# Patient Record
Sex: Male | Born: 1965
Health system: Southern US, Community
[De-identification: ages and names within clinical notes are randomized; demographics above are authoritative.]

## PROBLEM LIST (undated history)

## (undated) DIAGNOSIS — R188 Other ascites: Secondary | ICD-10-CM

## (undated) DIAGNOSIS — U071 COVID-19: Secondary | ICD-10-CM

## (undated) DIAGNOSIS — K7030 Alcoholic cirrhosis of liver without ascites: Secondary | ICD-10-CM

## (undated) DIAGNOSIS — K7031 Alcoholic cirrhosis of liver with ascites: Secondary | ICD-10-CM

## (undated) DIAGNOSIS — E119 Type 2 diabetes mellitus without complications: Secondary | ICD-10-CM

## (undated) DIAGNOSIS — K7460 Unspecified cirrhosis of liver: Secondary | ICD-10-CM

## (undated) DIAGNOSIS — F419 Anxiety disorder, unspecified: Secondary | ICD-10-CM

## (undated) HISTORY — PX: ANKLE SURGERY: SHX546

## (undated) HISTORY — DX: Unspecified cirrhosis of liver: R18.8

## (undated) HISTORY — DX: Alcoholic cirrhosis of liver with ascites: K70.31

## (undated) HISTORY — DX: Anxiety disorder, unspecified: F41.9

## (undated) HISTORY — DX: Type 2 diabetes mellitus without complications: E11.9

## (undated) HISTORY — DX: COVID-19: U07.1

## (undated) HISTORY — DX: Alcoholic cirrhosis of liver without ascites: K70.30

## (undated) HISTORY — DX: Unspecified cirrhosis of liver: K74.60

---

## 2003-05-22 ENCOUNTER — Encounter: Payer: Self-pay | Admitting: Emergency Medicine

## 2003-05-22 ENCOUNTER — Inpatient Hospital Stay (HOSPITAL_COMMUNITY): Admission: EM | Admit: 2003-05-22 | Discharge: 2003-05-29 | Payer: Self-pay

## 2003-05-22 ENCOUNTER — Encounter: Payer: Self-pay | Admitting: Specialist

## 2003-05-23 ENCOUNTER — Encounter: Payer: Self-pay | Admitting: Specialist

## 2003-05-25 ENCOUNTER — Encounter: Payer: Self-pay | Admitting: Specialist

## 2003-09-06 ENCOUNTER — Encounter: Payer: Self-pay | Admitting: Specialist

## 2003-09-06 ENCOUNTER — Encounter: Admission: RE | Admit: 2003-09-06 | Discharge: 2003-09-06 | Payer: Self-pay | Admitting: Specialist

## 2003-10-22 ENCOUNTER — Encounter: Admission: RE | Admit: 2003-10-22 | Discharge: 2003-10-22 | Payer: Self-pay | Admitting: *Deleted

## 2005-10-06 ENCOUNTER — Encounter: Admission: RE | Admit: 2005-10-06 | Discharge: 2005-10-06 | Payer: Self-pay | Admitting: Occupational Medicine

## 2007-03-24 ENCOUNTER — Emergency Department (HOSPITAL_COMMUNITY): Admission: EM | Admit: 2007-03-24 | Discharge: 2007-03-24 | Payer: Self-pay | Admitting: Family Medicine

## 2012-06-09 ENCOUNTER — Encounter (HOSPITAL_COMMUNITY): Payer: Self-pay | Admitting: Emergency Medicine

## 2012-06-09 ENCOUNTER — Emergency Department (INDEPENDENT_AMBULATORY_CARE_PROVIDER_SITE_OTHER)
Admission: EM | Admit: 2012-06-09 | Discharge: 2012-06-09 | Disposition: A | Discharge status: Home / Self Care | Payer: Worker's Compensation | Source: Home / Self Care | Attending: Emergency Medicine | Admitting: Emergency Medicine

## 2012-06-09 DIAGNOSIS — M7580 Other shoulder lesions, unspecified shoulder: Secondary | ICD-10-CM

## 2012-06-09 DIAGNOSIS — M67919 Unspecified disorder of synovium and tendon, unspecified shoulder: Secondary | ICD-10-CM

## 2012-06-09 MED ORDER — PREDNISONE 5 MG PO KIT: 1.0000 | PACK | Freq: Every day | ORAL | Status: DC

## 2012-06-09 MED ORDER — TRAMADOL HCL 50 MG PO TABS: 100.0000 mg | ORAL_TABLET | Freq: Three times a day (TID) | ORAL | Status: AC | PRN

## 2012-06-09 MED ORDER — MELOXICAM 15 MG PO TABS: 15.0000 mg | ORAL_TABLET | Freq: Every day | ORAL | Status: DC

## 2012-06-09 NOTE — ED Notes (Signed)
PT HERE WITH SUDDEN R SHOULDER PAIN AFTER REACHING FOR SOMETHING ON SHELF TODAY.POSS STRAIN BUT DENIES INJURY.PAIN WITH LIFTING.

## 2012-06-09 NOTE — ED Provider Notes (Signed)
Chief Complaint  Patient presents with  . Shoulder Pain    History of Present Illness:   The patient is a 46 year old male who has had right neck and shoulder pain since this afternoon around 4:55 PM. He was mixing some paint in a large bowl, then lifted up to put on a shelf. Thereafter he noticed the pain in his right trapezius ridge radiating into her shoulder and into his upper arm as far as the elbow, but not below the elbow. The upper arm feels a little bit tingly. It hurts to move his neck but also move the shoulder as well. He has a fairly good range of motion but with pain. He denies any weakness in the arm. He's never had any prior history of neck or shoulder injury or problems.  Review of Systems:  Other than noted above, the patient denies any of the following symptoms: Systemic:  No fevers, chills, sweats, or aches.  No fatigue or tiredness. Musculoskeletal:  No joint pain, arthritis, bursitis, swelling, back pain, or neck pain. Neurological:  No muscular weakness, paresthesias, headache, or trouble with speech or coordination.  No dizziness.   PMFSH:  Past medical history, family history, social history, meds, and allergies were reviewed.  Physical Exam:   Vital signs:  BP 129/91  Pulse 89  Temp 99.3 F (37.4 C) (Oral)  Resp 14  SpO2 95% Gen:  Alert and oriented times 3.  In no distress. Musculoskeletal: There was tenderness to palpation over the trapezius ridge and over the cervical spine. The neck has a fairly good range of motion but with pain. There is pain to palpation over the shoulder anteriorly and posteriorly. No swelling or deformity. The shoulder had a full range of motion actively and passively with pain on abduction. Impingement signs are positive. Hawkins tests was positive, empty cans test was positive, but muscle strength was normal. Otherwise, all joints had a full a ROM with no swelling, bruising or deformity.  No edema, pulses full. Extremities were warm and  pink.  Capillary refill was brisk.  Skin:  Clear, warm and dry.  No rash. Neuro:  Alert and oriented times 3.  Muscle strength was normal.  Sensation was intact to light touch.   Assessment:  The encounter diagnosis was Rotator cuff tendonitis.  Plan:   1.  The following meds were prescribed:   New Prescriptions   MELOXICAM (MOBIC) 15 MG TABLET    Take 1 tablet (15 mg total) by mouth daily.   PREDNISONE 5 MG KIT    Take 1 kit (5 mg total) by mouth daily after breakfast. Prednisone 5 mg 6 day dosepack.  Take as directed.   TRAMADOL (ULTRAM) 50 MG TABLET    Take 2 tablets (100 mg total) by mouth every 8 (eight) hours as needed for pain.   2.  The patient was instructed in symptomatic care, including rest and activity, elevation, application of ice and compression.  Appropriate handouts were given. 3.  The patient was told to return if becoming worse in any way, if no better in 3 or 4 days, and given some red flag symptoms that would indicate earlier return.   4.  The patient was told to follow up at occupational health next week. In the meantime he was given exercises to do and should use ice afterwards. No work until reevaluated at occupational health.   Reuben Likes, MD 06/09/12 [phone]

## 2012-06-15 ENCOUNTER — Telehealth (HOSPITAL_COMMUNITY): Payer: Self-pay | Admitting: *Deleted

## 2012-06-15 NOTE — ED Notes (Unsigned)
Mickie Kay called on VM and requested pt.'s chart. It is [employer].  Record faxed to [employer] and confirmation received. Vassie Moselle. 06/15/2012

## 2012-09-19 ENCOUNTER — Ambulatory Visit: Payer: Managed Care, Other (non HMO) | Admitting: Physician Assistant

## 2012-09-19 VITALS — BP 116/86 | HR 84 | Temp 98.0°F | Resp 18 | Ht 67.5 in | Wt 208.2 lb

## 2012-09-19 DIAGNOSIS — F411 Generalized anxiety disorder: Secondary | ICD-10-CM

## 2012-09-19 DIAGNOSIS — F419 Anxiety disorder, unspecified: Secondary | ICD-10-CM

## 2012-09-19 DIAGNOSIS — Z0000 Encounter for general adult medical examination without abnormal findings: Secondary | ICD-10-CM

## 2012-09-19 LAB — COMPREHENSIVE METABOLIC PANEL
ALT: 15 U/L (ref 0–53)
AST: 28 U/L (ref 0–37)
Albumin: 4.4 g/dL (ref 3.5–5.2)
Alkaline Phosphatase: 81 U/L (ref 39–117)
BUN: 11 mg/dL (ref 6–23)
CO2: 25 mEq/L (ref 19–32)
Calcium: 9.9 mg/dL (ref 8.4–10.5)
Chloride: 105 mEq/L (ref 96–112)
Creat: 1.06 mg/dL (ref 0.50–1.35)
Glucose, Bld: 91 mg/dL (ref 70–99)
Potassium: 4 mEq/L (ref 3.5–5.3)
Sodium: 140 mEq/L (ref 135–145)
Total Bilirubin: 1.2 mg/dL (ref 0.3–1.2)
Total Protein: 7.1 g/dL (ref 6.0–8.3)

## 2012-09-19 LAB — POCT CBC
Granulocyte percent: 65.4 %G (ref 37–80)
HCT, POC: 57.6 % — AB (ref 43.5–53.7)
Hemoglobin: 19 g/dL — AB (ref 14.1–18.1)
Lymph, poc: 1.8 (ref 0.6–3.4)
MCH, POC: 29.8 pg (ref 27–31.2)
MCHC: 33 g/dL (ref 31.8–35.4)
MCV: 90.5 fL (ref 80–97)
MID (cbc): 0.4 (ref 0–0.9)
MPV: 11 fL (ref 0–99.8)
POC Granulocyte: 4.2 (ref 2–6.9)
POC LYMPH PERCENT: 27.7 %L (ref 10–50)
POC MID %: 6.9 %M (ref 0–12)
Platelet Count, POC: 207 10*3/uL (ref 142–424)
RBC: 6.37 M/uL — AB (ref 4.69–6.13)
RDW, POC: 14.1 %
WBC: 6.4 10*3/uL (ref 4.6–10.2)

## 2012-09-19 LAB — LIPID PANEL
Cholesterol: 153 mg/dL (ref 0–200)
HDL: 43 mg/dL (ref >39)
LDL Cholesterol: 95 mg/dL (ref 0–99)
Total CHOL/HDL Ratio: 3.6 Ratio
Triglycerides: 75 mg/dL (ref <150)
VLDL: 15 mg/dL (ref 0–40)

## 2012-09-19 LAB — GLUCOSE, POCT (MANUAL RESULT ENTRY): POC Glucose: 89 mg/dl (ref 70–99)

## 2012-09-19 MED ORDER — CITALOPRAM HYDROBROMIDE 20 MG PO TABS: 20.0000 mg | ORAL_TABLET | Freq: Every day | ORAL | Status: DC

## 2012-09-19 MED ORDER — ALPRAZOLAM 0.25 MG PO TABS: 0.2500 mg | ORAL_TABLET | Freq: Every evening | ORAL | Status: DC | PRN

## 2012-09-19 NOTE — Progress Notes (Signed)
  Subjective:    Patient ID: Ian Hall, male    DOB: [birth year], 46 y.o.   MRN: [id number]  HPI 46 year old male presents for complete physical and evaluation of anxiety.  He has no known medical problems but does have a family history of diabetes.  Last physical date unknown.  Not taking any daily medications at this time.  Does admit to anxiety x several months. He has been married for about 1 year and states he worries about his wife while she is out. She is happy and seemingly unaware of his anxiety about her.  He also has stress about work.  He also admits to insomnia due to racing thoughts and stress.  He works second shift and so sleeping schedule also contributes to this. Denies ever having been on medication for anxiety or depression.      Review of Systems  Constitutional: Negative.   HENT: Negative.   Eyes: Negative.   Respiratory: Negative.   Cardiovascular: Negative.   Gastrointestinal: Negative.   Genitourinary: Negative.   Musculoskeletal: Negative.   Neurological: Negative.   Hematological: Negative.   Psychiatric/Behavioral: Negative for suicidal ideas and hallucinations. The patient is nervous/anxious.   All other systems reviewed and are negative.       Objective:   Physical Exam  Constitutional: He is oriented to person, place, and time. He appears well-developed and well-nourished.  HENT:  Head: Normocephalic and atraumatic.  Right Ear: Hearing, tympanic membrane, external ear and ear canal normal.  Left Ear: Hearing, tympanic membrane, external ear and ear canal normal.  Mouth/Throat: Uvula is midline, oropharynx is clear and moist and mucous membranes are normal. No oropharyngeal exudate.  Eyes: Conjunctivae normal are normal.  Neck: Normal range of motion. No thyromegaly present.  Cardiovascular: Normal rate, regular rhythm and normal heart sounds.   Pulmonary/Chest: Effort normal and breath sounds normal.  Abdominal: Soft. Bowel sounds are normal. There  is no tenderness. There is no rebound and no guarding.  Musculoskeletal: Normal range of motion.  Lymphadenopathy:    He has no cervical adenopathy.  Neurological: He is alert and oriented to person, place, and time.  Psychiatric: He has a normal mood and affect. His behavior is normal. Judgment and thought content normal.          Assessment & Plan:   1. Routine general medical examination at a health care facility  POCT CBC, Comprehensive metabolic panel, Lipid panel, TSH, POCT glucose (manual entry)  2. Anxiety  citalopram (CELEXA) 20 MG tablet, ALPRAZolam (XANAX) 0.25 MG tablet   Await labs Start Celexa 20 mg daily Xanax prn, especially at night. Advised use of caution with this medication and to use sparingly. Follow up in 4-6 weeks for recheck

## 2012-09-20 LAB — TSH: TSH: 2.783 u[IU]/mL (ref 0.350–4.500)

## 2012-11-17 ENCOUNTER — Ambulatory Visit: Payer: Managed Care, Other (non HMO) | Admitting: Physician Assistant

## 2012-11-17 VITALS — BP 110/86 | HR 74 | Temp 98.4°F | Resp 16 | Ht 67.0 in | Wt 205.8 lb

## 2012-11-17 DIAGNOSIS — Z125 Encounter for screening for malignant neoplasm of prostate: Secondary | ICD-10-CM

## 2012-11-17 DIAGNOSIS — F411 Generalized anxiety disorder: Secondary | ICD-10-CM

## 2012-11-17 DIAGNOSIS — F419 Anxiety disorder, unspecified: Secondary | ICD-10-CM

## 2012-11-17 LAB — IFOBT (OCCULT BLOOD): IFOBT: NEGATIVE

## 2012-11-17 MED ORDER — CITALOPRAM HYDROBROMIDE 20 MG PO TABS: 20.0000 mg | ORAL_TABLET | Freq: Every day | ORAL | Status: DC

## 2012-11-17 MED ORDER — ALPRAZOLAM 0.5 MG PO TABS: 0.2500 mg | ORAL_TABLET | Freq: Every evening | ORAL | Status: DC | PRN

## 2012-11-17 NOTE — Progress Notes (Signed)
  Subjective:    Patient ID: Ian Hall, male    DOB: 1966-11-20, 46 y.o.   MRN: [id number]  HPI 46 year old male presents for DOT PE and recheck on his anxiety.  States the Celexa was working well, although he did run out 2 weeks ago and has been unable to come in for recheck. He takes xanax 1-2 hours before going to sleep, so he has still not been able to sleep well.   He had CPE 2 months ago.  Doing well without complaints.     Review of Systems  Constitutional: Negative for fever and chills.  All other systems reviewed and are negative.       Objective:   Physical Exam  Constitutional: He is oriented to person, place, and time. He appears well-developed and well-nourished.  HENT:  Head: Normocephalic and atraumatic.  Right Ear: External ear normal.  Left Ear: External ear normal.  Eyes: Conjunctivae normal are normal.  Neck: Normal range of motion.  Cardiovascular: Normal rate, regular rhythm and normal heart sounds.   Pulmonary/Chest: Effort normal and breath sounds normal.  Abdominal: Hernia confirmed negative in the right inguinal area and confirmed negative in the left inguinal area.  Genitourinary: Rectum normal and prostate normal. Prostate is not tender.  Neurological: He is alert and oriented to person, place, and time.  Psychiatric: He has a normal mood and affect. His behavior is normal. Judgment and thought content normal.          Assessment & Plan:   1. Screening for prostate cancer  IFOBT POC (occult bld, rslt in office)  2. Anxiety  citalopram (CELEXA) 20 MG tablet, ALPRAZolam (XANAX) 0.5 MG tablet   Will complete DOT forms based on physical exam in October. He will get peripheral screen from opthalmology or optometry and bring results to [place] to complete his DOT card.  Continue Celexa 20 mg daily Xanax qhs right before sleeping  Follow up in 6 months for recheck of anxiety

## 2013-02-06 ENCOUNTER — Ambulatory Visit (INDEPENDENT_AMBULATORY_CARE_PROVIDER_SITE_OTHER): Payer: Self-pay

## 2013-02-06 ENCOUNTER — Other Ambulatory Visit: Payer: Self-pay | Admitting: Emergency Medicine

## 2013-02-06 DIAGNOSIS — Z0289 Encounter for other administrative examinations: Secondary | ICD-10-CM

## 2013-02-06 DIAGNOSIS — Z0000 Encounter for general adult medical examination without abnormal findings: Secondary | ICD-10-CM

## 2013-02-26 ENCOUNTER — Ambulatory Visit: Payer: Managed Care, Other (non HMO)

## 2013-02-26 ENCOUNTER — Ambulatory Visit (INDEPENDENT_AMBULATORY_CARE_PROVIDER_SITE_OTHER): Payer: Managed Care, Other (non HMO) | Admitting: Family Medicine

## 2013-02-26 VITALS — BP 118/82 | HR 75 | Temp 98.3°F | Resp 16 | Ht 67.0 in | Wt 210.0 lb

## 2013-02-26 DIAGNOSIS — M25511 Pain in right shoulder: Secondary | ICD-10-CM

## 2013-02-26 DIAGNOSIS — M25519 Pain in unspecified shoulder: Secondary | ICD-10-CM

## 2013-02-26 DIAGNOSIS — M79672 Pain in left foot: Secondary | ICD-10-CM

## 2013-02-26 DIAGNOSIS — M79609 Pain in unspecified limb: Secondary | ICD-10-CM

## 2013-02-26 MED ORDER — PREDNISONE 20 MG PO TABS: ORAL_TABLET | ORAL | Status: DC

## 2013-02-26 NOTE — Progress Notes (Signed)
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  He was working yesterday and by 2 o'clock he had developed some right shoulder pain.  He awoke with worse right shoulder pain and left lateral foot pain.  No specific injury occurred.  Had tendonitis 1 year ago.  No cough or fever, no other joint pains.  Objective:  NAD Decreased elevation of right shoulder with AROM.  No obvious abnormality of bones or skin.  Tender anterior upper aspect of shoulder to palpation.  Able to reach around behind himself with right arm Chest:  clear Left foot:  Tender lateral aspect of foot without swelling or ecchymosis.  [facility] reading (PRIMARY) by  Dr. Milus Glazier:  Neg right shoulder   Assessment:  Tendonitis left foot, arthralgia right shoulder  Plan: Prednisone OOW x 24 hours.

## 2013-03-04 ENCOUNTER — Ambulatory Visit: Payer: Managed Care, Other (non HMO) | Admitting: Family Medicine

## 2013-03-04 VITALS — BP 140/88 | HR 104 | Temp 99.0°F | Resp 18 | Ht 67.5 in | Wt 210.0 lb

## 2013-03-04 DIAGNOSIS — M25511 Pain in right shoulder: Secondary | ICD-10-CM

## 2013-03-04 DIAGNOSIS — M79672 Pain in left foot: Secondary | ICD-10-CM

## 2013-03-04 DIAGNOSIS — M25519 Pain in unspecified shoulder: Secondary | ICD-10-CM

## 2013-03-04 DIAGNOSIS — M79609 Pain in unspecified limb: Secondary | ICD-10-CM

## 2013-03-04 MED ORDER — PREDNISONE 20 MG PO TABS: ORAL_TABLET | ORAL | Status: DC

## 2013-03-04 NOTE — Progress Notes (Signed)
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 He was working 6 days ago and by 2 o'clock he had developed some right shoulder pain. He awoke with worse right shoulder pain and left lateral foot pain. No specific injury occurred. Had tendonitis 1 year ago.   Overall improved but some soreness presists No cough or fever, but outside of left foot there has been soreness which is improving with the prednisone.  Objective: NAD  Decreased elevation of right shoulder with AROM. No obvious abnormality of bones or skin. Tender anterior upper aspect of shoulder to palpation. Able to reach around behind himself with right arm  Chest: clear  Left foot: Tender lateral aspect of foot without swelling or ecchymosis.  [facility] reading (PRIMARY) by Dr. Milus Glazier: Neg right shoulder   Assessment: Tendonitis left foot, arthralgia right shoulder most likely tendonitis as well Plan:  Prednisone two more days OOW x until Wednesday.

## 2013-07-27 ENCOUNTER — Encounter: Payer: Self-pay | Admitting: Family Medicine

## 2013-09-19 ENCOUNTER — Ambulatory Visit: Payer: Managed Care, Other (non HMO) | Admitting: Family Medicine

## 2013-09-19 VITALS — BP 120/82 | HR 85 | Temp 97.5°F | Resp 18 | Ht 67.5 in | Wt 216.0 lb

## 2013-09-19 DIAGNOSIS — B081 Molluscum contagiosum: Secondary | ICD-10-CM

## 2013-09-19 DIAGNOSIS — B079 Viral wart, unspecified: Secondary | ICD-10-CM

## 2013-09-19 DIAGNOSIS — Q828 Other specified congenital malformations of skin: Secondary | ICD-10-CM

## 2013-09-19 NOTE — Progress Notes (Signed)
[facility] [address] [place] [address] [phone]  Date:  09/19/2013   Name:  Ian Hall   DOB:  05-15-1966   MRN:  [id number]  PCP:  No primary provider on file.    Chief Complaint: wart on right hand and cracked nail   History of Present Illness:  Ian Hall is a 47 y.o. very pleasant male patient who presents with the following:  About a month ago he hit his left index finger nail with a hammer.  This splint the nail- it is still attached but is split longitudinally.  It is no longer painful and the proximal nail plate is growing in in one piece.   He also notes a wart on the ring finger of his right hand.  He does not have any other warts.  Wonders if we can freeze this off today.   Finally, he notes a lot of skin tags which are troublesome to him.  They are around his neck, and especially bad in both axillae.    He is otherwise generally healthy.    There are no active problems to display for this patient.   Past Medical History  Diagnosis Date  . Anxiety     Past Surgical History  Procedure Laterality Date  . Ankle surgery      History  Substance Use Topics  . Smoking status: Never Smoker   . Smokeless tobacco: Not on file  . Alcohol Use: No    Family History  Problem Relation Age of Onset  . Diabetes Mother   . Memory loss Mother   . Diabetes Father     No Known Allergies  Medication list has been reviewed and updated.  Current Outpatient Prescriptions on File Prior to Visit  Medication Sig Dispense Refill  . Multiple Vitamin (MULTIVITAMIN) tablet Take 1 tablet by mouth daily.      . predniSONE (DELTASONE) 20 MG tablet Two daily with food  10 tablet  0   No current facility-administered medications on file prior to visit.    Review of Systems:  As per HPI- otherwise negative.   Physical Examination: Filed Vitals:   09/19/13 1315  BP: 120/82  Pulse: 85  Temp: 97.5 F (36.4 C)  Resp: 18   Filed  Vitals:   09/19/13 1315  Height: 5' 7.5" (1.715 m)  Weight: 216 lb (97.977 kg)   Body mass index is 33.31 kg/(m^2). Ideal Body Weight: Weight in (lb) to have BMI = 25: 161.7  GEN: WDWN, NAD, Non-toxic, A & O x 3, looks well HEENT: Atraumatic, Normocephalic. Neck supple. No masses, No LAD. Ears and Nose: No external deformity. CV: RRR, No M/G/R. No JVD. No thrill. No extra heart sounds. PULM: CTA B, no wheezes, crackles, rhonchi. No retractions. No resp. distress. No accessory muscle use. EXTR: No c/c/e NEURO Normal gait.  PSYCH: Normally interactive. Conversant. Not depressed or anxious appearing.  Calm demeanor.  Small wart on ring finger of right hand. Left index fingernail is splint along the distal nail bed.  The proximal nail plate is growing in in one piece. Multiple small skin tags around his neck line and also in both axillae.    VC obtained.  Wart prepped with alcohol. Cryotherapy X3 free/ thaw cycles, band- aid Skin tags around neckline prepped with betadine. Larger ones injected at the base with 1% lidocaine and epi.  Removed tags with forceps and scalpel.  Dry-sol and pressure for hemostasis, dressed  with band- aids.   Repeated this procedure for some of the skin tags under the right arm.  Removed 15 tags in total today  Assessment and Plan: Accessory skin tags  Wart  Treated wart as above Skin tags.  Removed a good number today. He will follow-up another day for more.  He has too many to remove in one day Splint nail: not painful.  Advised him to keep the edges filed to prevent catching on anything.  I believe the nail will grow back in in one piece.   Follow- up if any concerns.    Signed Lamar Blinks, MD

## 2013-11-08 ENCOUNTER — Ambulatory Visit: Payer: Managed Care, Other (non HMO) | Admitting: Emergency Medicine

## 2013-11-08 VITALS — BP 140/80 | HR 68 | Temp 98.0°F | Resp 16 | Ht 67.5 in | Wt 222.0 lb

## 2013-11-08 DIAGNOSIS — L909 Atrophic disorder of skin, unspecified: Secondary | ICD-10-CM

## 2013-11-08 DIAGNOSIS — L918 Other hypertrophic disorders of the skin: Secondary | ICD-10-CM

## 2013-11-08 NOTE — Progress Notes (Signed)
Procedure:  Consent obtained - R groin - #15 skin tags removed without difficulty - 1/2 of them removed after local anesthesia with 1% lido with epi.  Drsg placed.  L groin #9 skin tags removed without local anesthesia.  Drsg placed.  Wound care d/w pt.

## 2013-11-08 NOTE — Progress Notes (Signed)
[facility] [address], [place] [address] [phone]  Date:  11/08/2013   Name:  Ian Hall   DOB:  Mar 13, 1966   MRN:  [id number]  PCP:  No primary provider on file.    Chief Complaint: mole removal   History of Present Illness:  Ian Hall is a 47 y.o. very pleasant male patient who presents with the following:  Concerned that he has skin tags in his right axilla and groin.  Requests they be removed due to pain.  No improvement with over the counter medications or other home remedies. Denies other complaint or health concern today.   There are no active problems to display for this patient.   Past Medical History  Diagnosis Date  . Anxiety     Past Surgical History  Procedure Laterality Date  . Ankle surgery      History  Substance Use Topics  . Smoking status: Never Smoker   . Smokeless tobacco: Not on file  . Alcohol Use: No    Family History  Problem Relation Age of Onset  . Diabetes Mother   . Memory loss Mother   . Diabetes Father     No Known Allergies  Medication list has been reviewed and updated.  Current Outpatient Prescriptions on File Prior to Visit  Medication Sig Dispense Refill  . Multiple Vitamin (MULTIVITAMIN) tablet Take 1 tablet by mouth daily.      . predniSONE (DELTASONE) 20 MG tablet Two daily with food  10 tablet  0   No current facility-administered medications on file prior to visit.    Review of Systems:  As per HPI, otherwise negative.    Physical Examination: Filed Vitals:   11/08/13 1154  BP: 140/80  Pulse: 68  Temp: 98 F (36.7 C)  Resp: 16   Filed Vitals:   11/08/13 1154  Height: 5' 7.5" (1.715 m)  Weight: 222 lb (100.699 kg)   Body mass index is 34.24 kg/(m^2). Ideal Body Weight: Weight in (lb) to have BMI = 25: 161.7   GEN: WDWN, NAD, Non-toxic, Alert & Oriented x 3 HEENT: Atraumatic, Normocephalic.  Ears and Nose: No external deformity. EXTR: No  clubbing/cyanosis/edema NEURO: Normal gait.  PSYCH: Normally interactive. Conversant. Not depressed or anxious appearing.  Calm demeanor.  SKIN: inumerable skin tags.    Assessment and Plan: Skin tags 23 removed by Florentina Addison   Signed,  Phillips Odor, MD

## 2014-06-07 ENCOUNTER — Ambulatory Visit (INDEPENDENT_AMBULATORY_CARE_PROVIDER_SITE_OTHER): Payer: Managed Care, Other (non HMO) | Admitting: Family Medicine

## 2014-06-07 VITALS — BP 128/80 | HR 72 | Temp 97.7°F | Resp 16 | Ht 67.0 in | Wt 222.4 lb

## 2014-06-07 DIAGNOSIS — Q828 Other specified congenital malformations of skin: Secondary | ICD-10-CM

## 2014-06-07 DIAGNOSIS — B079 Viral wart, unspecified: Secondary | ICD-10-CM

## 2014-06-07 NOTE — Patient Instructions (Signed)
Come back and see [place] in a couple of weeks for more skin tag removal if you want.  We can also freeze your wart again.  Good to see you today!

## 2014-06-07 NOTE — Progress Notes (Signed)
[facility] [address], [place] [id number] [phone]  Date:  06/07/2014   Name:  Ian ParsonsByron E Hall   DOB:  09/24/1966   MRN:  [id number]  PCP:  No PCP Per Patient    Chief Complaint: Nevus   History of Present Illness:  Ian Hall is a 48 y.o. very pleasant male patient who presents with the following:  Would like to have some skin tags removed.  He tends to get a lot of skin tags- we have removed for him a couple of times in the past. He is self- conscious about skin tags under his arms.  He also notes a small wart on his right ring finger that he wold like treated.     He is otherwise generally healthy  No other concerns today  There are no active problems to display for this patient.   Past Medical History  Diagnosis Date  . Anxiety     Past Surgical History  Procedure Laterality Date  . Ankle surgery      History  Substance Use Topics  . Smoking status: Former Games developermoker  . Smokeless tobacco: Not on file  . Alcohol Use: No    Family History  Problem Relation Age of Onset  . Diabetes Mother   . Memory loss Mother   . Diabetes Father     No Known Allergies  Medication list has been reviewed and updated.  Current Outpatient Prescriptions on File Prior to Visit  Medication Sig Dispense Refill  . Multiple Vitamin (MULTIVITAMIN) tablet Take 1 tablet by mouth daily.      . predniSONE (DELTASONE) 20 MG tablet Two daily with food  10 tablet  0   No current facility-administered medications on file prior to visit.    Review of Systems:  As per HPI- otherwise negative.   Physical Examination: Filed Vitals:   06/07/14 1123  BP: 128/80  Pulse: 72  Temp: 97.7 F (36.5 C)  Resp: 16   Filed Vitals:   06/07/14 1123  Height: 5\' 7"  (1.702 m)  Weight: 222 lb 6.4 oz (100.88 kg)   Body mass index is 34.82 kg/(m^2). Ideal Body Weight: Weight in (lb) to have BMI = 25: 159.3  GEN: WDWN, NAD, Non-toxic, A & O x 3 HEENT: Atraumatic,  Normocephalic. Neck supple. No masses, No LAD. Ears and Nose: No external deformity. CV: RRR, No M/G/R. No JVD. No thrill. No extra heart sounds. PULM: CTA B, no wheezes, crackles, rhonchi. No retractions. No resp. distress. No accessory muscle use. ABD: S, NT, ND, +BS. No rebound. No HSM. EXTR: No c/c/e NEURO Normal gait.  PSYCH: Normally interactive. Conversant. Not depressed or anxious appearing.  Calm demeanor.  Small wart ring finger right hand Multiple skin tags in both axillae  VC obtained.  Skin tags prepped with betadine, anesthesia with a small wheal of lidocaine with epi, removed with 15 blade.  Less than 5ml of lidocaine with epi total used. Skin tag removal sites treated with drysol for hemostasis, and dressed with bandaids Cryotherapy to small wart on his lateral right ring finger X3 cycles  Assessment and Plan: Accessory skin tags  Wart  Removed approx 15 skin tags as above, and cryotheapy to wart X3 cycles.    Signed Abbe AmsterdamJessica Deauna Yaw, MD

## 2014-09-27 ENCOUNTER — Ambulatory Visit (INDEPENDENT_AMBULATORY_CARE_PROVIDER_SITE_OTHER): Payer: Self-pay | Admitting: Family Medicine

## 2014-09-27 VITALS — BP 124/88 | HR 80 | Temp 98.0°F | Resp 18 | Ht 67.25 in | Wt 224.8 lb

## 2014-09-27 DIAGNOSIS — Z0279 Encounter for issue of other medical certificate: Secondary | ICD-10-CM

## 2014-09-27 DIAGNOSIS — Z021 Encounter for pre-employment examination: Secondary | ICD-10-CM

## 2014-09-27 DIAGNOSIS — Z024 Encounter for examination for driving license: Secondary | ICD-10-CM

## 2014-09-27 NOTE — Patient Instructions (Signed)
2 year card.  Follow up with primary care provider to discuss sleep study with your snoring. Until that time, do not drive if you are sleepy. Let me know if there are any questions.

## 2014-09-27 NOTE — Progress Notes (Signed)
   Subjective:    Patient ID: Ian Hall, male    DOB: 08/16/1966, 48 y.o.   MRN: [id number]  HPI Ian Hall is a 48 y.o. male Here for DOT exam. See scanned ppwk/.  No known medical problems, no rx meds. Last card good for 2 years.  Does have snoring at night. No known pauses, occasional daytime somnolence only. Not having to drink caffeine for alertness, never fallen asleep at the wheel.     No known hx of heart disease.   I personally performed the services described in this documentation, which was scribed in my presence. The recorded information has been reviewed and considered, and addended by me as needed.  No corrective lenses, no visual difficulty, no problems with glare.   Review of Systems See DOT ppwk - negative other than listed above.     Objective:   Physical Exam  Vitals reviewed. Constitutional: He is oriented to person, place, and time. He appears well-developed and well-nourished.  HENT:  Head: Normocephalic and atraumatic.  Right Ear: External ear normal.  Left Ear: External ear normal.  Mouth/Throat: Oropharynx is clear and moist.  Eyes: Conjunctivae and EOM are normal. Pupils are equal, round, and reactive to light.  Neck: Normal range of motion. Neck supple. No thyromegaly present.  Cardiovascular: Normal rate, regular rhythm, normal heart sounds and intact distal pulses.   Pulmonary/Chest: Effort normal and breath sounds normal. No respiratory distress. He has no wheezes.  Abdominal: Soft. He exhibits no distension. There is no tenderness. Hernia confirmed negative in the right inguinal area and confirmed negative in the left inguinal area.  Musculoskeletal: Normal range of motion. He exhibits no edema and no tenderness.  Lymphadenopathy:    He has no cervical adenopathy.  Neurological: He is alert and oriented to person, place, and time. He has normal reflexes.  Skin: Skin is warm and dry.  Psychiatric: He has a normal mood and affect. His behavior  is normal.   Filed Vitals:   09/27/14 1222  BP: 124/88  Pulse: 80  Temp: 98 F (36.7 C)  TempSrc: Oral  Resp: 18  Height: 5' 7.25" (1.708 m)  Weight: 224 lb 12.8 oz (101.969 kg)  SpO2: 95%    Visual Acuity Screening   Right eye Left eye Both eyes  Without correction: 20/20 20/25-2 20/20  With correction:     Comments: Peripheral: R 85 L 85 The patient can distinguish the colors red, amber and green.   Hearing Screening Comments: The patient was able to hear a forced whisper from 10 feet.       Assessment & Plan:  Ian Hall is a 48 y.o. 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 See paperwork.  Snoring with only rare/occasional fatigue during day.  No pauses at night and not needing caffeine or other daytime stimulants. Recommended eval with PCP to discuss sleep study, and if diagnosed with OSA - compliance report needed at next DOT.   No orders of the defined types were placed in this encounter.   Patient Instructions  2 year card.  Follow up with primary care provider to discuss sleep study with your snoring. Until that time, do not drive if you are sleepy. Let me know if there are any questions.

## 2015-08-13 ENCOUNTER — Ambulatory Visit (INDEPENDENT_AMBULATORY_CARE_PROVIDER_SITE_OTHER): Payer: Managed Care, Other (non HMO)

## 2015-08-13 ENCOUNTER — Ambulatory Visit (INDEPENDENT_AMBULATORY_CARE_PROVIDER_SITE_OTHER): Payer: Managed Care, Other (non HMO) | Admitting: Emergency Medicine

## 2015-08-13 VITALS — BP 126/90 | HR 69 | Temp 97.6°F | Resp 16 | Ht 67.0 in | Wt 230.4 lb

## 2015-08-13 DIAGNOSIS — R059 Cough, unspecified: Secondary | ICD-10-CM

## 2015-08-13 DIAGNOSIS — J029 Acute pharyngitis, unspecified: Secondary | ICD-10-CM

## 2015-08-13 DIAGNOSIS — J189 Pneumonia, unspecified organism: Secondary | ICD-10-CM | POA: Diagnosis not present

## 2015-08-13 DIAGNOSIS — R05 Cough: Secondary | ICD-10-CM

## 2015-08-13 LAB — POCT RAPID STREP A (OFFICE): RAPID STREP A SCREEN: NEGATIVE

## 2015-08-13 LAB — POCT CBC
Granulocyte percent: 52.7 %G (ref 37–80)
HCT, POC: 49 % (ref 43.5–53.7)
HEMOGLOBIN: 15.3 g/dL (ref 14.1–18.1)
LYMPH, POC: 2.2 (ref 0.6–3.4)
MCH, POC: 26.9 pg — AB (ref 27–31.2)
MCHC: 31.3 g/dL — AB (ref 31.8–35.4)
MCV: 85.9 fL (ref 80–97)
MID (cbc): 1 — AB (ref 0–0.9)
MPV: 8.3 fL (ref 0–99.8)
POC Granulocyte: 3.5 (ref 2–6.9)
POC LYMPH %: 32.2 % (ref 10–50)
POC MID %: 15.1 % — AB (ref 0–12)
Platelet Count, POC: 168 10*3/uL (ref 142–424)
RBC: 5.7 M/uL (ref 4.69–6.13)
RDW, POC: 13.5 %
WBC: 6.7 10*3/uL (ref 4.6–10.2)

## 2015-08-13 MED ORDER — BENZONATATE 100 MG PO CAPS: 100.0000 mg | ORAL_CAPSULE | Freq: Three times a day (TID) | ORAL | Status: DC | PRN

## 2015-08-13 MED ORDER — AZITHROMYCIN 250 MG PO TABS: ORAL_TABLET | ORAL | Status: DC

## 2015-08-13 MED ORDER — FLUTICASONE PROPIONATE 50 MCG/ACT NA SUSP: 2.0000 | Freq: Every day | NASAL | Status: DC

## 2015-08-13 NOTE — Progress Notes (Addendum)
Subjective:  This chart was scribed for Ian Hall, by [name], at [facility].  This patient was seen in room 12 and the patient's care was started at 2:38 PM.    Patient ID: Ian Hall, male    DOB: 1966/11/08, 49 y.o.   MRN: [id number] Chief Complaint  Patient presents with  . Cough    x 3 days  . Headache    x 3 days    HPI  HPI Comments: Ian Hall is a 49 y.o. male who presents to the [facility] complaining of a headache and non productive cough onset three days ago (worse at night time).  Patient has associated symptoms of loss of appetite with nausea/vomitting- states that he is unable to eat, burning in his chest/flank when he coughs as well as nasal congestion/ postnasal drip.  He is unsure if he has been running a fever but has been feeling hot the last couple days.  Patient states that 1 person in his household was sick over the weekend.  Patient does not smoke.  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     There are no active problems to display for this patient.  Past Medical History  Diagnosis Date  . Anxiety    Past Surgical History  Procedure Laterality Date  . Ankle surgery     No Known Allergies Prior to Admission medications   Medication Sig Start Date End Date Taking? Authorizing Provider  Multiple Vitamin (MULTIVITAMIN) tablet Take 1 tablet by mouth daily.    Historical Provider, Hall  predniSONE (DELTASONE) 20 MG tablet Two daily with food Patient not taking: Reported on 08/13/2015 03/04/13   Elvina Sidle, Hall   Social History   Social History  . Marital Status: Married    Spouse Name: N/A  . Number of Children: N/A  . Years of Education: N/A   Occupational History  . Administrator   Social History Main Topics  . Smoking status: Former Games developer  . Smokeless tobacco: Not on file  . Alcohol Use:  No  . Drug Use: No  . Sexual Activity: Yes    Birth Control/ Protection: None   Other Topics Concern  . Not on file   Social History Narrative    Review of Systems  Constitutional: Positive for appetite change. Negative for diaphoresis.  HENT: Positive for congestion and postnasal drip.   Eyes: Negative for pain, discharge and itching.  Respiratory: Positive for cough. Negative for choking.   Gastrointestinal: Positive for nausea and vomiting.  Musculoskeletal: Negative for neck pain and neck stiffness.  Skin: Negative for rash and wound.  Neurological: Positive for headaches. Negative for syncope and speech difficulty.       Objective:   Physical Exam CONSTITUTIONAL: Well developed/well nourished HEAD: Normocephalic/atraumatic EYES: EOMI/PERRL ENMT: Mucous membranes moist NECK: supple no meningeal signs SPINE/BACK:entire spine nontender CV: S1/S2 noted, no murmurs/rubs/gallops noted LUNGS: Lungs are clear to auscultation bilaterally, no apparent distress ABDOMEN: soft, nontender, no rebound or guarding, bowel sounds noted throughout abdomen GU:no cva tenderness NEURO: Pt is awake/alert/appropriate, moves all extremitiesx4.  No facial droop.   EXTREMITIES: pulses normal/equal, full ROM SKIN: warm, color normal PSYCH: no abnormalities of mood noted, alert and oriented to situation    Filed Vitals:   08/13/15 1337  BP: 126/90  Pulse: 69  Temp: 97.6 F (36.4 C)  TempSrc: Oral  Resp: 16  Height:  (1.702 m)  Weight: 230 lb 6.4 oz (104.509 kg)  SpO2: 98%   [facility] (PRIMARY) x-ray report read by Dr. Cleta Alberts,  Mild increased markings in right base and retrocardiac  Results for orders placed or performed in visit on 08/13/15  POCT CBC  Result Value Ref Range   WBC 6.7 4.6 - 10.2 K/uL   Lymph, poc 2.2 0.6 - 3.4   POC LYMPH PERCENT 32.2 10 - 50 %L   MID (cbc) 1.0 (A) 0 - 0.9   POC MID % 15.1 (A) 0 - 12 %M   POC Granulocyte 3.5 2 - 6.9   Granulocyte percent 52.7 37  - 80 %G   RBC 5.70 4.69 - 6.13 M/uL   Hemoglobin 15.3 14.1 - 18.1 g/dL   HCT, POC 16.1 09.6 - 53.7 %   MCV 85.9 80 - 97 fL   MCH, POC 26.9 (A) 27 - 31.2 pg   MCHC 31.3 (A) 31.8 - 35.4 g/dL   RDW, POC 04.5 %   Platelet Count, POC 168 142 - 424 K/uL   MPV 8.3 0 - 99.8 fL  POCT rapid strep A  Result Value Ref Range   Rapid Strep A Screen Negative Negative   Meds ordered this encounter  Medications  . azithromycin (ZITHROMAX) 250 MG tablet    Sig: Take 2 tablets today and then 1 a day for 4 days    Dispense:  6 tablet    Refill:  0  . benzonatate (TESSALON) 100 MG capsule    Sig: Take 1-2 capsules (100-200 mg total) by mouth 3 (three) times daily as needed for cough.    Dispense:  40 capsule    Refill:  0  . fluticasone (FLONASE) 50 MCG/ACT nasal spray    Sig: Place 2 sprays into both nostrils daily.    Dispense:  16 g    Refill:  6      Assessment & Plan:  I suspect this is a viral type illness. Will treat with Zithromax  And Tessalon Perles. And flonase. Chest x-ray did show increased markings especially on the lateral. show some mild increase in the markings.I personally performed the services described in this documentation, which was scribed in my presence. The recorded information has been reviewed and is accurate. [name]

## 2015-08-13 NOTE — Patient Instructions (Signed)

## 2015-08-21 ENCOUNTER — Ambulatory Visit (INDEPENDENT_AMBULATORY_CARE_PROVIDER_SITE_OTHER): Payer: Managed Care, Other (non HMO) | Admitting: Family Medicine

## 2015-08-21 VITALS — BP 112/72 | HR 79 | Temp 97.9°F | Resp 18 | Ht 67.5 in | Wt 226.0 lb

## 2015-08-21 DIAGNOSIS — R059 Cough, unspecified: Secondary | ICD-10-CM

## 2015-08-21 DIAGNOSIS — R05 Cough: Secondary | ICD-10-CM

## 2015-08-21 DIAGNOSIS — J988 Other specified respiratory disorders: Secondary | ICD-10-CM | POA: Diagnosis not present

## 2015-08-21 DIAGNOSIS — J22 Unspecified acute lower respiratory infection: Secondary | ICD-10-CM

## 2015-08-21 MED ORDER — HYDROCODONE-HOMATROPINE 5-1.5 MG/5ML PO SYRP: ORAL_SOLUTION | ORAL | Status: DC

## 2015-08-21 NOTE — Patient Instructions (Signed)
Tessalon Perles or Mucinex DM for your cough during the day, hydrocodone cough syrup at night if needed. Drink plenty of fluids and rest as needed, as long as cough is improving, can return to work on Monday. Return for recheck if increasing or worsening cough, shortness of breath, or fevers.  Cough, Adult  A cough is a reflex that helps clear your throat and airways. It can help heal the body or may be a reaction to an irritated airway. A cough may only last 2 or 3 weeks (acute) or may last more than 8 weeks (chronic).  CAUSES Acute cough:  Viral or bacterial infections. Chronic cough:  Infections.  Allergies.  Asthma.  Post-nasal drip.  Smoking.  Heartburn or acid reflux.  Some medicines.  Chronic lung problems (COPD).  Cancer. SYMPTOMS   Cough.  Fever.  Chest pain.  Increased breathing rate.  High-pitched whistling sound when breathing (wheezing).  Colored mucus that you cough up (sputum). TREATMENT   A bacterial cough may be treated with antibiotic medicine.  A viral cough must run its course and will not respond to antibiotics.  Your caregiver may recommend other treatments if you have a chronic cough. HOME CARE INSTRUCTIONS   Only take over-the-counter or prescription medicines for pain, discomfort, or fever as directed by your caregiver. Use cough suppressants only as directed by your caregiver.  Use a cold steam vaporizer or humidifier in your bedroom or home to help loosen secretions.  Sleep in a semi-upright position if your cough is worse at night.  Rest as needed.  Stop smoking if you smoke. SEEK IMMEDIATE MEDICAL CARE IF:   You have pus in your sputum.  Your cough starts to worsen.  You cannot control your cough with suppressants and are losing sleep.  You begin coughing up blood.  You have difficulty breathing.  You develop pain which is getting worse or is uncontrolled with medicine.  You have a fever. MAKE SURE YOU:    Understand these instructions.  Will watch your condition.  Will get help right away if you are not doing well or get worse. Document Released: 05/14/2011 Document Revised: 02/07/2012 Document Reviewed: 05/14/2011 [facility] Patient Information 2015 [place], [place]. This information is not intended to replace advice given to you by your health care provider. Make sure you discuss any questions you have with your health care provider.

## 2015-08-21 NOTE — Progress Notes (Signed)
Subjective:  This chart was scribed for Trinna Post, MD by Broadus John, Medical Scribe. This patient was seen in Room 9 and the patient's care was started at 10:04 AM.   Patient ID: Ian Hall, male    DOB: [birth year], 49 y.o.   MRN: [id number]  Chief Complaint  Patient presents with  . Follow-up    no better  . Cough  . Depression    see screen    HPI HPI Comments: Ian Hall is a 49 y.o. male who presents to [facility] for a follow up. Pt was seen by Dr. Cleta Alberts on 09/14, 8 days ago. At that time he was having cough for three days. Few increased marking on chest x-ray, He was treated for possible PNA with Zpack, benzonatate, and fluticasone nasal spray for cough and congestion. Chest X-ray reports was negative. There was also some suspicion for a viral type illness.  Today, pt indicates that he has been compliant with his mediations. He reports that the cough symptoms are disturbing his sleep. He also reports symptoms of subjective fever.   Positive depression screen. Note this was normal at visit 8 days ago. Depression screen [facility] 2/9 08/21/2015 08/13/2015  Decreased Interest 1 0  Down, Depressed, Hopeless 1 0  PHQ - 2 Score 2 0  Altered sleeping 1 -  Tired, decreased energy 1 -  Change in appetite 1 -  Feeling bad or failure about yourself  0 -  Trouble concentrating 0 -  Moving slowly or fidgety/restless 0 -  Suicidal thoughts 0 -  PHQ-9 Score 5 -  Difficult doing work/chores Not difficult at all -   Pt reports feeling depression symptoms at times, however these symptoms started when the onset of his current illness started. He denies any symptoms prior to that.   Pt works with butter making paints   There are no active problems to display for this patient.  Past Medical History  Diagnosis Date  . Anxiety    Past Surgical History  Procedure Laterality Date  . Ankle surgery     No Known Allergies Prior to Admission medications     Medication Sig Start Date End Date Taking? Authorizing Provider  benzonatate (TESSALON) 100 MG capsule Take 1-2 capsules (100-200 mg total) by mouth 3 (three) times daily as needed for cough. 08/13/15  Yes Collene Gobble, MD  fluticasone (FLONASE) 50 MCG/ACT nasal spray Place 2 sprays into both nostrils daily. 08/13/15  Yes Collene Gobble, MD  Multiple Vitamin (MULTIVITAMIN) tablet Take 1 tablet by mouth daily.   Yes Historical Provider, MD  predniSONE (DELTASONE) 20 MG tablet Two daily with food 03/04/13  Yes Elvina Sidle, MD  azithromycin (ZITHROMAX) 250 MG tablet Take 2 tablets today and then 1 a day for 4 days Patient not taking: Reported on 08/21/2015 08/13/15   Collene Gobble, MD   Social History   Social History  . Marital Status: Married    Spouse Name: N/A  . Number of Children: N/A  . Years of Education: N/A   Occupational History  . Administrator   Social History Main Topics  . Smoking status: Former Games developer  . Smokeless tobacco: Not on file  . Alcohol Use: No  . Drug Use: No  . Sexual Activity: Yes    Birth Control/ Protection: None   Other Topics Concern  . Not on file   Social History Narrative     Review  of Systems  Constitutional: Positive for fever.  Respiratory: Positive for cough.   Psychiatric/Behavioral: Positive for sleep disturbance and dysphoric mood.       Objective:   Physical Exam  Constitutional: He is oriented to person, place, and time. He appears well-developed and well-nourished.  HENT:  Head: Normocephalic and atraumatic.  Right Ear: Tympanic membrane, external ear and ear canal normal.  Left Ear: Tympanic membrane, external ear and ear canal normal.  Nose: No rhinorrhea.  Mouth/Throat: Oropharynx is clear and moist and mucous membranes are normal. No oropharyngeal exudate or posterior oropharyngeal erythema.  Nose- Slight edema of the turbinates, no discharge.  Ears- Cerumen  in bilateral ear canals but not  obstructed.   Eyes: Conjunctivae are normal. Pupils are equal, round, and reactive to light.  Neck: Neck supple.  Cardiovascular: Normal rate, regular rhythm, normal heart sounds and intact distal pulses.   No murmur heard. Pulmonary/Chest: Effort normal and breath sounds normal. He has no wheezes. He has no rhonchi. He has no rales.  Abdominal: Soft. There is no tenderness.  Lymphadenopathy:    He has no cervical adenopathy.  Neurological: He is alert and oriented to person, place, and time.  Skin: Skin is warm and dry. No rash noted.  Psychiatric: He has a normal mood and affect. His behavior is normal.  Vitals reviewed.  Filed Vitals:   08/21/15 0943  BP: 112/72  Pulse: 79  Temp: 97.9 F (36.6 C)  TempSrc: Oral  Resp: 18  Height: 5' 7.5" (1.715 m)  Weight: 226 lb (102.513 kg)  SpO2: 99%      Assessment & Plan:    By signing my name below, I, Rawaa Al Rifaie, attest that this documentation has been prepared under the direction and in the presence of Trinna Post, MD.  Broadus John, Medical Scribe. 08/21/2015.  10:13 AM. Ian Hall is a 49 y.o. male Cough - Plan: HYDROcodone-homatropine (HYCODAN) 5-1.5 MG/5ML syrup  LRTI (lower respiratory tract infection) - Plan: HYDROcodone-homatropine (HYCODAN) 5-1.5 MG/5ML syrup  Lower respiratory tract infection/bronchitis likely versus early community-acquired pneumonia when seen last visit. Cough is overall improved, clear on exam.   -Can continue Tessalon or try Mucinex DM during the day for the cough, hydrocodone cough syrup prescribed for evening. Rest and out of work for the next 2 days and return on Monday if he feels able to. If unable to return by Monday, may need repeat exam here or x-ray. RTC precautions discussed.  Some of fatigue and positive depression screening symptoms were due to current illness, denied previous depression symptoms or severe depression symptoms. Advised if these persist after his current  illness, to return to discuss further.    Meds ordered this encounter  Medications  . HYDROcodone-homatropine (HYCODAN) 5-1.5 MG/5ML syrup    Sig: 18m by mouth a bedtime as needed for cough.    Dispense:  120 mL    Refill:  0   Patient Instructions  Tessalon Perles or Mucinex DM for your cough during the day, hydrocodone cough syrup at night if needed. Drink plenty of fluids and rest as needed, as long as cough is improving, can return to work on Monday. Return for recheck if increasing or worsening cough, shortness of breath, or fevers.  Cough, Adult  A cough is a reflex that helps clear your throat and airways. It can help heal the body or may be a reaction to an irritated airway. A cough may only last 2 or 3 weeks (acute)  or may last more than 8 weeks (chronic).  CAUSES Acute cough:  Viral or bacterial infections. Chronic cough:  Infections.  Allergies.  Asthma.  Post-nasal drip.  Smoking.  Heartburn or acid reflux.  Some medicines.  Chronic lung problems (COPD).  Cancer. SYMPTOMS   Cough.  Fever.  Chest pain.  Increased breathing rate.  High-pitched whistling sound when breathing (wheezing).  Colored mucus that you cough up (sputum). TREATMENT   A bacterial cough may be treated with antibiotic medicine.  A viral cough must run its course and will not respond to antibiotics.  Your caregiver may recommend other treatments if you have a chronic cough. HOME CARE INSTRUCTIONS   Only take over-the-counter or prescription medicines for pain, discomfort, or fever as directed by your caregiver. Use cough suppressants only as directed by your caregiver.  Use a cold steam vaporizer or humidifier in your bedroom or home to help loosen secretions.  Sleep in a semi-upright position if your cough is worse at night.  Rest as needed.  Stop smoking if you smoke. SEEK IMMEDIATE MEDICAL CARE IF:   You have pus in your sputum.  Your cough starts to  worsen.  You cannot control your cough with suppressants and are losing sleep.  You begin coughing up blood.  You have difficulty breathing.  You develop pain which is getting worse or is uncontrolled with medicine.  You have a fever. MAKE SURE YOU:   Understand these instructions.  Will watch your condition.  Will get help right away if you are not doing well or get worse. Document Released: 05/14/2011 Document Revised: 02/07/2012 Document Reviewed: 05/14/2011 [facility] Patient Information 2015 [place], [place]. This information is not intended to replace advice given to you by your health care provider. Make sure you discuss any questions you have with your health care provider.     I personally performed the services described in this documentation, which was scribed in my presence. The recorded information has been reviewed and considered, and addended by me as needed.

## 2015-08-22 ENCOUNTER — Telehealth: Payer: Self-pay

## 2015-08-22 DIAGNOSIS — Z0271 Encounter for disability determination: Secondary | ICD-10-CM

## 2015-08-22 NOTE — Telephone Encounter (Signed)
Patient brought in FMLA paperwork to be completed by doctor on 08/25/15 at his follow up appointment. Not sure what Dr. He will see, he saw Dr. Carlota Raspberry on 08/21/15. I have scanned in a blank copy of the ppw. I will also leave the blank copy on the FMLA/Disability desk upstairs. Once they have been completed please return to disabilities so we can scan the completed copy into the chart.

## 2015-08-26 ENCOUNTER — Ambulatory Visit (INDEPENDENT_AMBULATORY_CARE_PROVIDER_SITE_OTHER): Payer: Managed Care, Other (non HMO) | Admitting: Family Medicine

## 2015-08-26 VITALS — BP 120/78 | HR 78 | Temp 98.0°F | Resp 16 | Ht 68.0 in | Wt 226.0 lb

## 2015-08-26 DIAGNOSIS — R059 Cough, unspecified: Secondary | ICD-10-CM

## 2015-08-26 DIAGNOSIS — R05 Cough: Secondary | ICD-10-CM

## 2015-08-26 DIAGNOSIS — J189 Pneumonia, unspecified organism: Secondary | ICD-10-CM

## 2015-08-26 MED ORDER — ALBUTEROL SULFATE HFA 108 (90 BASE) MCG/ACT IN AERS: 1.0000 | INHALATION_SPRAY | RESPIRATORY_TRACT | Status: DC | PRN

## 2015-08-26 MED ORDER — BENZONATATE 100 MG PO CAPS: 100.0000 mg | ORAL_CAPSULE | Freq: Three times a day (TID) | ORAL | Status: DC | PRN

## 2015-08-26 MED ORDER — PROMETHAZINE-DM 6.25-15 MG/5ML PO SYRP: 2.5000 mL | ORAL_SOLUTION | Freq: Four times a day (QID) | ORAL | Status: DC | PRN

## 2015-08-26 NOTE — Progress Notes (Signed)
Subjective:  This chart was scribed for [name] MD,  by [name], at [facility].  This patient was seen in room 7  and the patient's care was started at 12:55 PM.    Patient ID: Ian Hall, male    DOB: 12-22-1965, 50 y.o.   MRN: [id number] Chief Complaint  Patient presents with  . Follow-up    cough    HPI  HPI Comments: Ian Hall is a 49 y.o. male who presents to the [facility] for a follow up regarding a persistent cough.  He states that his cough has gotten better since his last visit here.  He has associated symptoms of difficulty breathing through his nose so he uses his nasal spray(about 2-3 times a day, which also helps with his cough).   He has been using his cough syrup and states that it has helped him to slow down the cough significantly but he has been itching all over after he takes this medication.  He denies any bumps associated with the itching.  He is still using the tessalon pears and denies any difficulty eating or drinking fluids.  He denies any fever/chills. Patient had asthma a couple years ago and used to use an inhaler.   ----- He is here for a follow up regarding a cough and a respiratory infection.  He was seen by Dr. Cleta Alberts on Sep 14th, with a 3 day history of a cough at that time and was treated with z pack and tessalon pearls for possible pneumonia as well as Flonase nasal spray. Chest x ray report was negative.  Still persistent cough when I saw him on the 22nd.    He was prescribed hydrocodone cough syrup at night and is here for a follow up.    There are no active problems to display for this patient.  Past Medical History  Diagnosis Date  . Anxiety    Past Surgical History  Procedure Laterality Date  . Ankle surgery     No Known Allergies Prior to Admission medications   Medication Sig Start Date End Date Taking? Authorizing Provider  fluticasone (FLONASE) 50 MCG/ACT nasal spray Place 2  sprays into both nostrils daily. 08/13/15  Yes Collene Gobble, MD  HYDROcodone-homatropine [facility]) 5-1.5 MG/5ML syrup 56m by mouth a bedtime as needed for cough. 08/21/15  Yes Shade Flood, MD  Multiple Vitamin (MULTIVITAMIN) tablet Take 1 tablet by mouth daily.   Yes Historical Provider, MD   Social History   Social History  . Marital Status: Married    Spouse Name: N/A  . Number of Children: N/A  . Years of Education: N/A   Occupational History  . Administrator   Social History Main Topics  . Smoking status: Former Games developer  . Smokeless tobacco: Not on file  . Alcohol Use: No  . Drug Use: No  . Sexual Activity: Yes    Birth Control/ Protection: None   Other Topics Concern  . Not on file   Social History Narrative           Review of Systems  Constitutional: Negative for fever and chills.  Eyes: Negative for pain, redness and itching.  Respiratory: Positive for cough.   Gastrointestinal: Negative for nausea and vomiting.       Objective:   Physical Exam  Constitutional: He is oriented to person, place, and time. He appears well-developed and well-nourished.  HENT:  Head: Normocephalic and atraumatic.  Right Ear: Tympanic membrane, external ear and ear canal normal.  Left Ear: Tympanic membrane, external ear and ear canal normal.  Nose: No rhinorrhea.  Mouth/Throat: Oropharynx is clear and moist and mucous membranes are normal. No oropharyngeal exudate or posterior oropharyngeal erythema.  Bilateral canals have cerumen but not obstructive.   Eyes: Conjunctivae are normal. Pupils are equal, round, and reactive to light.  Neck: Neck supple.  Cardiovascular: Normal rate, regular rhythm, normal heart sounds and intact distal pulses.   No murmur heard. Pulmonary/Chest: Effort normal and breath sounds normal. He has no wheezes. He has no rhonchi. He has no rales.  Abdominal: Soft. There is no tenderness.  Lymphadenopathy:    He has no  cervical adenopathy.  Neurological: He is alert and oriented to person, place, and time.  Skin: Skin is warm and dry. No rash noted.  Psychiatric: He has a normal mood and affect. His behavior is normal.  Vitals reviewed.  Filed Vitals:   08/26/15 1013  BP: 120/78  Pulse: 78  Temp: 98 F (36.7 C)  TempSrc: Oral  Resp: 16  Height: 5\' 8"  (1.727 m)  Weight: 226 lb (102.513 kg)  SpO2: 98%   Peak flow reading is 490, about 89 % of predicted. Predicted peak flow is approximately 550 based on height and age.       Assessment & Plan:   Ian Hall is a 49 y.o. male Cough - Plan: benzonatate (TESSALON) 100 MG capsule, albuterol (PROVENTIL HFA;VENTOLIN HFA) 108 (90 BASE) MCG/ACT inhaler, promethazine-dextromethorphan (PROMETHAZINE-DM) 6.25-15 MG/5ML syrup  Pneumonia, organism unspecified - Plan: benzonatate (TESSALON) 100 MG capsule  -Improving. Continue Tessalon as needed, albuterol as needed for wheezing, with return to clinic precautions if persistent use or breakthrough symptoms. SPECT he had some itching with hydrocodone, so we'll stop this and at this allergy list. Will try Phenergan DM cough syrup if needed at night, but also discuss tonight use this if he is wheezing or short of breath.  Plan to return to work September 29, and apparently has paperwork for me to complete. RTC precautions discussed. English and Spanish spoken with understanding expressed.  Meds ordered this encounter  Medications  . benzonatate (TESSALON) 100 MG capsule    Sig: Take 1-2 capsules (100-200 mg total) by mouth 3 (three) times daily as needed for cough.    Dispense:  40 capsule    Refill:  0  . albuterol (PROVENTIL HFA;VENTOLIN HFA) 108 (90 BASE) MCG/ACT inhaler    Sig: Inhale 1-2 puffs into the lungs every 4 (four) hours as needed for wheezing or shortness of breath.    Dispense:  1 Inhaler    Refill:  0  . promethazine-dextromethorphan (PROMETHAZINE-DM) 6.25-15 MG/5ML syrup    Sig: Take 2.5-5  mLs by mouth 4 (four) times daily as needed for cough.    Dispense:  118 mL    Refill:  0   Patient Instructions  Tessalon perles 3 veces cada dia si necesario tos.  inhalador albuterol si necesario falta de aire. Phenergan DM en la noche si necesario. regrese si empeorse.  Tos - Adultos  (Cough, Adult)  La tos es un reflejo que ayuda a limpiar las vas areas y Administrator. Puede ayudar a curar el organismo o ser Neomia Dear reaccin a un irritante. La tos puede durar [employer] 2  3 semanas (aguda) o puede durar ms de 8 semanas (crnica)  CAUSAS  Tos aguda:   Infecciones virales o bacterianas. Leonette Most  crnica.   Infecciones.  Alergias.  Asma.  Goteo post nasal.  El hbito de fumar.  Acidez o reflujo gstrico.  Algunos medicamentos.  Problemas pulmonares crnicos  Cncer. SNTOMAS   Tos.  Grant Ruts.  Dolor en el pecho.  Aumento en el ritmo respiratorio.  Ruidos agudos al respirar (sibilancias).  Moco coloreado al toser (esputo). TRATAMIENTO   Un tos de causa bacteriana puede tratarse con antibiticos.  La tos de origen viral debe seguir su curso y no responde a los antibiticos.  El mdico podr recomendar otros tratamientos si tiene tos crnica. INSTRUCCIONES PARA EL CUIDADO EN EL HOGAR   Solo tome medicamentos que se pueden comprar sin receta o recetados para Chief Technology Officer, Dentist o fiebre, como le indica el mdico. Utilice antitusivos slo en la forma indicada por el mdico.  Use un vaporizador o humidificador de niebla fra en la habitacin para ayudar a aflojar las secreciones.  Duerma en posicin semi erguida si la tos empeora por la noche.  Descanse todo lo que pueda.  Si fuma, abandone el hbito. SOLICITE ATENCIN MDICA DE INMEDIATO SI:   Observa pus en el esputo.  La tos empeora.  No puede controlar la tos con antitusivos y no puede dormir debido a Secretary/administrator.  Comienza a escupir sangre al toser.  Tiene dificultad para respirar.  El dolor empeora o no puede  controlarlo con los medicamentos.  Tiene fiebre. ASEGRESE DE QUE:   Comprende estas instrucciones.  Controlar su enfermedad.  Solicitar ayuda de inmediato si no mejora o si empeora. Document Released: 06/23/2011 Document Revised: 02/07/2012 [facility] Patient Information 2015 [place], [place]. This information is not intended to replace advice given to you by your health care provider. Make sure you discuss any questions you have with your health care provider.  Broncoespasmo (Bronchospasm) El broncoespasmo se produce cuando los conductos que transportan el aire desde y [employer] pulmones (vas respiratorias) sufren un espasmo o se Engineer, technical sales. Durante un broncoespasmo es Control and instrumentation engineer. Esto se debe a que las vas respiratorias se Engineer, technical sales. El broncoespasmo puede ser desencadenado por:  Environmental consultant. Puede ser a animales, polen, alimentos o moho.  Infeccin. Esta es una causa frecuente de broncoespasmo.  Actividad fsica.  Agentes irritantes. Por ejemplo, polucin, humo de cigarrillos, olores fuertes, aerosoles y vapores de [place].  Los cambios climticos.  Estrs.  Estar emocionado. CUIDADOS EN EL HOGAR   Cuente siempre con un plan para pedir ayuda. Sepa cundo debe llamar al mdico y a los servicios de emergencia de su localidad (911 en [place] Sepa dnde puede acceder a un servicio de emergencias.  Solo tome los medicamentos que le haya indicado su mdico.  Si le indicaron el uso de un inhalador o nebulizador, consulte a su mdico para que le explique cmo usarlo correctamente. Siempre use un espaciador con Therapist, nutritional, si le proporcionaron uno  Mantenga la calma durante el ataque. Trate de relajarse y respire ms lentamente.  Controle el ambiente de su casa:  Cambie el filtro de la calefaccin y el aire acondicionado al menos una vez al mes.  Limite el uso de hogares o estufas a lea.  No fume. No permita que fumen en su casa.  Evite la exposicin a perfumes y  fragancias.  Elimine las plagas (como cucarachas y ratones) y sus excrementos.  Elimine las plantas si observa moho en ellas.  Mantenga su casa limpia y [place].  Reemplace las alfombras por pisos de [place], baldosas o vinilo. Las alfombras pueden retener la caspa  de los animales y el polvo.  Use almohadas, mantas y cubre colchones antialrgicos.  Lave las sbanas y las mantas todas las semanas con agua caliente. Squelas en [place].  Use mantas de polister o algodn.  Lvese las manos con frecuencia. SOLICITE AYUDA SI:  Tiene dolores musculares.  Siente dolor en el pecho.  El catarro espeso que elimina (esputo) cambia de un color claro o blanco a un color amarillo, verde, gris o sanguinolento.  El catarro espeso que elimina se hace ms espeso.  Tiene algn problema que pueda relacionarse con los medicamentos que est tomando como:  Una erupcin cutnea.  Picazn.  Hinchazn.  Problemas para respirar. SOLICITE AYUDA DE INMEDIATO SI:  No puede respirar normalmente.  No puede dejar de toser.  El tratamiento no lo ayuda a Solicitor.  Siente un dolor muy intenso en el pecho. ASEGRESE DE QUE:   Comprende estas instrucciones.  Controlar su afeccin.  Recibir ayuda de inmediato si no mejora o si empeora. Document Released: 12/18/2010 Document Revised: 11/20/2013 [facility] Patient Information 2015 [place], [place]. This information is not intended to replace advice given to you by your health care provider. Make sure you discuss any questions you have with your health care provider.     I personally performed the services described in this documentation, which was scribed in my presence. The recorded information has been reviewed and considered, and addended by me as needed.

## 2015-08-26 NOTE — Patient Instructions (Signed)
Tessalon perles 3 veces cada dia si necesario tos.  inhalador albuterol si necesario falta de aire. Phenergan DM en la noche si necesario. regrese si empeorse.  Tos - Adultos  (Cough, Adult)  La tos es un reflejo que ayuda a limpiar las vas areas y Administrator. Puede ayudar a curar el organismo o ser Neomia Dear reaccin a un irritante. La tos puede durar [employer] 2  3 semanas (aguda) o puede durar ms de 8 semanas (crnica)  CAUSAS  Tos aguda:   Infecciones virales o bacterianas. Tos crnica.   Infecciones.  Alergias.  Asma.  Goteo post nasal.  El hbito de fumar.  Acidez o reflujo gstrico.  Algunos medicamentos.  Problemas pulmonares crnicos  Cncer. SNTOMAS   Tos.  Grant Ruts.  Dolor en el pecho.  Aumento en el ritmo respiratorio.  Ruidos agudos al respirar (sibilancias).  Moco coloreado al toser (esputo). TRATAMIENTO   Un tos de causa bacteriana puede tratarse con antibiticos.  La tos de origen viral debe seguir su curso y no responde a los antibiticos.  El mdico podr recomendar otros tratamientos si tiene tos crnica. INSTRUCCIONES PARA EL CUIDADO EN EL HOGAR   Solo tome medicamentos que se pueden comprar sin receta o recetados para Chief Technology Officer, Dentist o fiebre, como le indica el mdico. Utilice antitusivos slo en la forma indicada por el mdico.  Use un vaporizador o humidificador de niebla fra en la habitacin para ayudar a aflojar las secreciones.  Duerma en posicin semi erguida si la tos empeora por la noche.  Descanse todo lo que pueda.  Si fuma, abandone el hbito. SOLICITE ATENCIN MDICA DE INMEDIATO SI:   Observa pus en el esputo.  La tos empeora.  No puede controlar la tos con antitusivos y no puede dormir debido a Secretary/administrator.  Comienza a escupir sangre al toser.  Tiene dificultad para respirar.  El dolor empeora o no puede controlarlo con los medicamentos.  Tiene fiebre. ASEGRESE DE QUE:   Comprende estas instrucciones.  Controlar  su enfermedad.  Solicitar ayuda de inmediato si no mejora o si empeora. Document Released: 06/23/2011 Document Revised: 02/07/2012 [facility] Patient Information 2015 [place], [place]. This information is not intended to replace advice given to you by your health care provider. Make sure you discuss any questions you have with your health care provider.  Broncoespasmo (Bronchospasm) El broncoespasmo se produce cuando los conductos que transportan el aire desde y [employer] pulmones (vas respiratorias) sufren un espasmo o se Engineer, technical sales. Durante un broncoespasmo es Control and instrumentation engineer. Esto se debe a que las vas respiratorias se Engineer, technical sales. El broncoespasmo puede ser desencadenado por:  Environmental consultant. Puede ser a animales, polen, alimentos o moho.  Infeccin. Esta es una causa frecuente de broncoespasmo.  Actividad fsica.  Agentes irritantes. Por ejemplo, polucin, humo de cigarrillos, olores fuertes, aerosoles y vapores de [place].  Los cambios climticos.  Estrs.  Estar emocionado. CUIDADOS EN EL HOGAR   Cuente siempre con un plan para pedir ayuda. Sepa cundo debe llamar al mdico y a los servicios de emergencia de su localidad (911 en [place] Sepa dnde puede acceder a un servicio de emergencias.  Solo tome los medicamentos que le haya indicado su mdico.  Si le indicaron el uso de un inhalador o nebulizador, consulte a su mdico para que le explique cmo usarlo correctamente. Siempre use un espaciador con Therapist, nutritional, si le proporcionaron uno  Mantenga la calma durante el ataque. Trate de relajarse y respire ms lentamente.  Controle el [place]  casa:  Cambie el filtro de la calefaccin y Office manager acondicionado al menos una vez al mes.  Limite el uso de hogares o estufas a lea.  No fume. No permita que fumen en su casa.  Evite la exposicin a perfumes y fragancias.  Elimine las plagas (como cucarachas y ratones) y sus excrementos.  Elimine las plantas si observa moho  en ellas.  Mantenga su casa limpia y [place].  Reemplace las alfombras por pisos de [place], baldosas o vinilo. Las alfombras pueden retener la caspa de los animales y [place].  Use almohadas, mantas y cubre colchones antialrgicos.  Lave las sbanas y las mantas todas las semanas con agua caliente. Squelas en [place].  Use mantas de polister o algodn.  Lvese las manos con frecuencia. SOLICITE AYUDA SI:  Tiene dolores musculares.  Siente dolor en el pecho.  El catarro espeso que elimina (esputo) cambia de un color claro o blanco a un color amarillo, verde, gris o sanguinolento.  El catarro espeso que elimina se hace ms espeso.  Tiene algn problema que pueda relacionarse con los medicamentos que est tomando como:  Una erupcin cutnea.  Picazn.  Hinchazn.  Problemas para respirar. SOLICITE AYUDA DE INMEDIATO SI:  No puede respirar normalmente.  No puede dejar de toser.  El tratamiento no lo ayuda a Solicitor.  Siente un dolor muy intenso en el pecho. ASEGRESE DE QUE:   Comprende estas instrucciones.  Controlar su afeccin.  Recibir ayuda de inmediato si no mejora o si empeora. Document Released: 12/18/2010 Document Revised: 11/20/2013 [facility] Patient Information 2015 [place], [place]. This information is not intended to replace advice given to you by your health care provider. Make sure you discuss any questions you have with your health care provider.

## 2015-08-26 NOTE — Telephone Encounter (Signed)
Patient is in clinic today seeing Dr. Carlota Raspberry I have placed the FMLA forms in his chart for them to be completed during his visit, we need to make a copy of these for FMLA/Disabilities and give the originals to the patient.

## 2015-08-28 NOTE — Telephone Encounter (Signed)
Spoke to Romie Minus about this yesterday 08/27/15 she stated Dr. Carlota Raspberry wanted a message sent to him about it. Not sure what she did with the paperwork but patient was going to come back into the office on 08/27/15. So did we complete the forms, if not can we completed them as soon as we can. Thank you

## 2015-08-28 NOTE — Telephone Encounter (Signed)
Patient called to check the status of his return to work letter and his FMLA forms. Checked the pick up drawer and there was nothing there for patient. Two letters were written on 08/26/2015 and 08/27/2015 for the patient. Can they be printed for patient or does it need more review? If patient's FMLA forms have been misplaced there is a blank copy on file that can be reprinted. Dr. Carlota Raspberry, I will be happy to go over the forms with you and fill them out. Thank you for your help :-)

## 2015-08-28 NOTE — Telephone Encounter (Signed)
I saw patient 2 days ago. At that time he asked me to give him a return to work letter. I did provide him a return to work letter for September 29. He did mention he had some FMLA paperwork that he had brought in, but there was no message in my inbox reflecting this. I was asked by staff last night during clinic about the paperwork, and asked for them to route message as usually done, and will work on the paperwork today. Unfortunately was unable to complete that paperwork medially last night as we were extremely busy at that time, and that was the first time I had seen his paperwork. I'll be happy to complete the  paperwork for him today and will be ready for pickup.   Completed, ready for pickup.

## 2015-08-28 NOTE — Telephone Encounter (Signed)
Paperwork completed, scanned, and patient notified that it is ready for pick up.

## 2016-01-01 ENCOUNTER — Ambulatory Visit (INDEPENDENT_AMBULATORY_CARE_PROVIDER_SITE_OTHER): Payer: Managed Care, Other (non HMO) | Admitting: Urgent Care

## 2016-01-01 ENCOUNTER — Ambulatory Visit (INDEPENDENT_AMBULATORY_CARE_PROVIDER_SITE_OTHER): Payer: Managed Care, Other (non HMO)

## 2016-01-01 VITALS — BP 120/78 | HR 74 | Temp 98.3°F | Resp 18 | Ht 67.5 in | Wt 222.0 lb

## 2016-01-01 DIAGNOSIS — K625 Hemorrhage of anus and rectum: Secondary | ICD-10-CM | POA: Diagnosis not present

## 2016-01-01 DIAGNOSIS — R1084 Generalized abdominal pain: Secondary | ICD-10-CM | POA: Diagnosis not present

## 2016-01-01 DIAGNOSIS — Z833 Family history of diabetes mellitus: Secondary | ICD-10-CM | POA: Diagnosis not present

## 2016-01-01 DIAGNOSIS — R140 Abdominal distension (gaseous): Secondary | ICD-10-CM

## 2016-01-01 DIAGNOSIS — E669 Obesity, unspecified: Secondary | ICD-10-CM

## 2016-01-01 DIAGNOSIS — R7303 Prediabetes: Secondary | ICD-10-CM

## 2016-01-01 LAB — COMPREHENSIVE METABOLIC PANEL
ALK PHOS: 97 U/L (ref 40–115)
ALT: 25 U/L (ref 9–46)
AST: 51 U/L — ABNORMAL HIGH (ref 10–40)
Albumin: 4.1 g/dL (ref 3.6–5.1)
BILIRUBIN TOTAL: 0.8 mg/dL (ref 0.2–1.2)
BUN: 14 mg/dL (ref 7–25)
CALCIUM: 9.1 mg/dL (ref 8.6–10.3)
CHLORIDE: 106 mmol/L (ref 98–110)
CO2: 24 mmol/L (ref 20–31)
CREATININE: 1.01 mg/dL (ref 0.60–1.35)
GLUCOSE: 134 mg/dL — AB (ref 65–99)
POTASSIUM: 3.9 mmol/L (ref 3.5–5.3)
Sodium: 138 mmol/L (ref 135–146)
Total Protein: 6.9 g/dL (ref 6.1–8.1)

## 2016-01-01 LAB — POCT CBC
Granulocyte percent: 54.5 %G (ref 37–80)
HCT, POC: 47.5 % (ref 43.5–53.7)
HEMOGLOBIN: 17 g/dL (ref 14.1–18.1)
LYMPH, POC: 2.4 (ref 0.6–3.4)
MCH: 30.3 pg (ref 27–31.2)
MCHC: 35.8 g/dL — AB (ref 31.8–35.4)
MCV: 84.7 fL (ref 80–97)
MID (cbc): 0.6 (ref 0–0.9)
MPV: 9.4 fL (ref 0–99.8)
POC GRANULOCYTE: 3.6 (ref 2–6.9)
POC LYMPH PERCENT: 36.1 %L (ref 10–50)
POC MID %: 9.4 % (ref 0–12)
Platelet Count, POC: 130 10*3/uL — AB (ref 142–424)
RBC: 5.61 M/uL (ref 4.69–6.13)
RDW, POC: 13.1 %
WBC: 6.6 10*3/uL (ref 4.6–10.2)

## 2016-01-01 LAB — POCT URINALYSIS DIP (MANUAL ENTRY)
BILIRUBIN UA: NEGATIVE
BILIRUBIN UA: NEGATIVE
Glucose, UA: NEGATIVE
Leukocytes, UA: NEGATIVE
Nitrite, UA: NEGATIVE
PH UA: 6.5
PROTEIN UA: NEGATIVE
RBC UA: NEGATIVE
Spec Grav, UA: 1.025
Urobilinogen, UA: 2

## 2016-01-01 LAB — POCT GLYCOSYLATED HEMOGLOBIN (HGB A1C): Hemoglobin A1C: 6.1

## 2016-01-01 NOTE — Progress Notes (Signed)
MRN: [id number] DOB: [birth year]  Subjective:   Ian Hall is a 50 y.o. male presenting for chief complaint of Abdominal Pain  Reports several year history of intermittent anal pain worse with bowel movements, feels a stinging sensation. Also reports bright red blood on toilet paper, bloating, has a lot of gas. Has ~3 loose bowel movements per day. Patient's diet has minimal fiber, hydrates well. Works as a Warehouse manager, lifts heavy objects regularly at work. Denies fever, nausea, vomiting, constipation, chest pain, shob, polyuria, hematuria, dysuria, flank pain. Denies alcohol use. Reports family history of diabetes in his father.   Damiean currently has no medications in their medication list. Also is allergic to hydrocodone.  Oden  has a past medical history of Anxiety. Also  has past surgical history that includes Ankle surgery.  Objective:   Vitals: BP 120/78 mmHg  Pulse 74  Temp(Src) 98.3 F (36.8 C) (Oral)  Resp 18  Ht 5' 7.5" (1.715 m)  Wt 222 lb (100.699 kg)  BMI 34.24 kg/m2  SpO2 98%  Physical Exam  Constitutional: He is oriented to person, place, and time. He appears well-developed and well-nourished.  HENT:  Mouth/Throat: Oropharynx is clear and moist.  Eyes: No scleral icterus.  Cardiovascular: Normal rate, regular rhythm and intact distal pulses.  Exam reveals no gallop and no friction rub.   No murmur heard. Pulmonary/Chest: No respiratory distress. He has no wheezes. He has no rales.  Abdominal: Soft. Bowel sounds are normal. He exhibits no distension and no mass. There is tenderness (generalized throughout).  Genitourinary: Rectal exam shows tenderness (over area outlined). Rectal exam shows no external hemorrhoid, no internal hemorrhoid, no fissure, no mass and anal tone normal.     Neurological: He is alert and oriented to person, place, and time.  Skin: Skin is warm and dry.   Results for orders placed or performed in visit on 01/01/16 (from  the past 24 hour(s))  POCT CBC     Status: Abnormal   Collection Time: 01/01/16 11:26 AM  Result Value Ref Range   WBC 6.6 4.6 - 10.2 K/uL   Lymph, poc 2.4 0.6 - 3.4   POC LYMPH PERCENT 36.1 10 - 50 %L   MID (cbc) 0.6 0 - 0.9   POC MID % 9.4 0 - 12 %M   POC Granulocyte 3.6 2 - 6.9   Granulocyte percent 54.5 37 - 80 %G   RBC 5.61 4.69 - 6.13 M/uL   Hemoglobin 17.0 14.1 - 18.1 g/dL   HCT, POC 52.8 41.3 - 53.7 %   MCV 84.7 80 - 97 fL   MCH, POC 30.3 27 - 31.2 pg   MCHC 35.8 (A) 31.8 - 35.4 g/dL   RDW, POC 24.4 %   Platelet Count, POC 130 (A) 142 - 424 K/uL   MPV 9.4 0 - 99.8 fL  POCT urinalysis dipstick     Status: None   Collection Time: 01/01/16 11:26 AM  Result Value Ref Range   Color, UA yellow yellow   Clarity, UA clear clear   Glucose, UA negative negative   Bilirubin, UA negative negative   Ketones, POC UA negative negative   Spec Grav, UA 1.025    Blood, UA negative negative   pH, UA 6.5    Protein Ur, POC negative negative   Urobilinogen, UA 2.0    Nitrite, UA Negative Negative   Leukocytes, UA Negative Negative  POCT glycosylated hemoglobin (Hb A1C)     Status:  None   Collection Time: 01/01/16 11:26 AM  Result Value Ref Range   Hemoglobin A1C 6.1    Assessment and Plan :   1. Generalized abdominal pain 2. Bloating 3. Bright red blood per rectum - Rectal exam very reassuring, no hemorrhoids or hemorrhoidal skin tags or fissures noted. UA, CBC, HPI, physical exam is reassuring against prostatitis. Patient may experience dermatitis from frequent loose stools. I recommended patient make sure he wipes the area dry and after showers recommended he try Lubriderm in the perianal area. - Patient will check back with me in 1-2 weeks if his problem persists.  4. Obesity (BMI 30-39.9) 5. Pre-diabetes 6. Family history of diabetes mellitus - Patient is to make significant dietary changes. He will recheck with me in 3-6 months if he is unable to make lifestyle changes.  Consider starting Metformin at that point. Patient agreed.  Wallis Bamberg, PA-C [facility] [phone] 01/01/2016 10:43 AM

## 2016-01-01 NOTE — Patient Instructions (Addendum)
NOTE:  Because you received an x-ray today, you will receive an invoice from [facility]. Please contact [facility] at [phone] with questions or concerns regarding your invoice. Our billing staff will not be able to assist you with those questions.    Dolor abdominal en adultos (Abdominal Pain, Adult) El dolor puede tener muchas causas. Normalmente la causa del dolor abdominal no es una enfermedad y Scientist, clinical (histocompatibility and immunogenetics) sin TEFL teacher. Frecuentemente puede controlarse y tratarse en casa. Su mdico le Medical sales representative examen fsico y posiblemente solicite anlisis de sangre y radiografas para ayudar a Chief Strategy Officer la gravedad de su dolor. Sin embargo, en [employer], debe transcurrir ms tiempo antes de que se pueda Clinical research associate una causa evidente del dolor. Antes de llegar a ese punto, es posible que su mdico no sepa si necesita ms pruebas o un tratamiento ms profundo. INSTRUCCIONES PARA EL CUIDADO EN EL HOGAR  Est atento al dolor para ver si hay cambios. Las siguientes indicaciones ayudarn a Architectural technologist que pueda sentir:  [place] solo medicamentos de venta libre o recetados, segn las indicaciones del mdico.  No tome laxantes a menos que se lo haya indicado su mdico.  Pruebe con Neomia Dear dieta lquida absoluta (caldo, t o agua) segn se lo indique su mdico. Introduzca gradualmente una dieta normal, segn su tolerancia. SOLICITE ATENCIN MDICA SI:  Tiene dolor abdominal sin explicacin.  Tiene dolor abdominal relacionado con nuseas o diarrea.  Tiene dolor cuando orina o defeca.  Experimenta dolor abdominal que lo despierta de noche.  Tiene dolor abdominal que empeora o mejora cuando come alimentos.  Tiene dolor abdominal que empeora cuando come alimentos grasosos.  Tiene fiebre. SOLICITE ATENCIN MDICA DE INMEDIATO SI:   El dolor no desaparece en un plazo mximo de 2horas.  No deja de (vomitar).  El Engineer, mining se siente solo en partes del abdomen, como el lado  derecho o la parte inferior izquierda del abdomen.  Evaca materia fecal sanguinolenta o negra, de aspecto alquitranado. ASEGRESE DE QUE:  Comprende estas instrucciones.  Controlar su afeccin.  Recibir ayuda de inmediato si no mejora o si empeora.   Esta informacin no tiene Theme park manager el consejo del mdico. Asegrese de hacerle al mdico cualquier pregunta que tenga.   Document Released: 11/15/2005 Document Revised: 12/06/2014 Elsevier Interactive Patient Education 2016 [employer].   Obesidad (Obesity) Se considera obesidad cuando hay demasiada grasa corporal y el ndice de masa corporal [facility]) es de 30 o ms. [facility] es la estimacin de la grasa corporal y se calcula a partir de la altura y Joseph. Generalmente, es el mdico el que calcula el ndice de masa corporal durante las visitas de control habituales. La obesidad se produce cuando se consumen ms caloras que las que se gastan realizando ejercicios o actividad fsica [employer]. Cuando la obesidad es [place], puede causar enfermedades graves o Sports administrator, como:  Ictus.  Cardiopata.  Diabetes.  Cncer.  Artritis.  Presin arterial elevada (hipertensin arterial).  Colesterol elevado.  Apnea del sueo.  Disfuncin erctil.  Problemas de infertilidad. CAUSAS   Comer habitualmente alimentos poco saludables.  Falta de actividad fsica.  Ciertos trastornos, por ejemplo, menor actividad de la tiroides (hipotiroidismo), sndrome de [place] y sndrome de ovario poliqustico.  Ciertos medicamentos, como los corticoides, algunos antidepresivos y antipsicticos.  Factores genticos.  Falta de sueo. DIAGNSTICO El mdico podr diagnosticar obesidad despus de calcular el IMC. La obesidad se diagnostica cuando el IMC es de 30 o ms. Hay otros mtodos para  medir el nivel de obesidad. Otros mtodos consisten en medir el grosor de un pliegue de piel o la circunferencia de la cintura, y comparar  la circunferencia de la cadera con la circunferencia de la cintura. TRATAMIENTO  Un programa de tratamiento saludable incluye todos o algunos de los siguientes pasos:  Cambios en la alimentacin a Air cabin crew.  Ejercicios y [place] fsica.  Cambios en la conducta y en el estilo de vida.  Medicamentos (solo con la supervisin de su mdico). Los medicamentos pueden ser tiles, pero solamente si se usan junto con programas de dieta y ejercicio. Si el ndice de masa corporal es 40 o ms, el mdico puede recomendar que se someta a una ciruga especializada o que participe en programas que lo ayuden a Geophysical data processor. Un programa poco saludable incluye:  Ayunar.  Dietas de moda.  Suplementos y frmacos. Estas elecciones no son exitosas para un control del peso a Air cabin crew. INSTRUCCIONES PARA EL CUIDADO EN EL HOGAR  Haga ejercicio y [place] fsica segn las indicaciones de su mdico. Para aumentar la actividad fsica, trate lo siguiente:  Utilice las Microbiologist del Medical sales representative.  Estacione lejos de la entrada de las tiendas.  Arregle el jardn, ande en bicicleta o camine en vez de mirar televisin o usar la computadora.  Consuma alimentos y bebidas saludables, bajos en caloras, de manera habitual. Coma ms frutas y verduras frescas. Utilice un recetario de alimentos de bajas caloras o tome clases de cocina saludable.  Limite las comidas rpidas, los dulces y los snacks procesados.  Coma porciones ms pequeas.  Lleve un diario de todo lo que come. Hay muchas pginas web gratuitas que podrn ayudarlo. Puede ser til [employer] alimentos de modo que pueda determinar si est consumiendo el tamao adecuado de las porciones.  Evite el consumo alcohol. Beba ms agua y bebidas sin caloras.  Tome vitaminas y suplementos solamente como se lo haya recomendado su mdico.  Tambin pueden ser de gran ayuda los grupos de apoyo para perder peso y la orientacin de un nutricionista  matriculado, un terapeuta y un programa de educacin para reducir Development worker, community. SOLICITE ATENCIN MDICA DE INMEDIATO SI:  Siente dolor u opresin en el pecho.  Siente dificultad para respirar o Company secretary.  Siente debilidad o adormecimiento en las piernas.  Se siente confundido o tiene dificultad para hablar.  Tiene cambios repentinos en la visin.   Esta informacin no tiene Theme park manager el consejo del mdico. Asegrese de hacerle al mdico cualquier pregunta que tenga.   Document Released: 11/15/2005 Document Revised: 12/06/2014 Elsevier Interactive Patient Education [employer]

## 2016-01-05 ENCOUNTER — Encounter: Payer: Self-pay | Admitting: Urgent Care

## 2016-01-29 ENCOUNTER — Ambulatory Visit (INDEPENDENT_AMBULATORY_CARE_PROVIDER_SITE_OTHER): Payer: Managed Care, Other (non HMO) | Admitting: Physician Assistant

## 2016-01-29 VITALS — BP 122/64 | HR 97 | Temp 101.5°F | Resp 18 | Ht 68.0 in | Wt 225.0 lb

## 2016-01-29 DIAGNOSIS — J101 Influenza due to other identified influenza virus with other respiratory manifestations: Secondary | ICD-10-CM

## 2016-01-29 DIAGNOSIS — R05 Cough: Secondary | ICD-10-CM | POA: Diagnosis not present

## 2016-01-29 DIAGNOSIS — R69 Illness, unspecified: Principal | ICD-10-CM

## 2016-01-29 DIAGNOSIS — R509 Fever, unspecified: Secondary | ICD-10-CM

## 2016-01-29 DIAGNOSIS — J111 Influenza due to unidentified influenza virus with other respiratory manifestations: Secondary | ICD-10-CM

## 2016-01-29 DIAGNOSIS — R51 Headache: Secondary | ICD-10-CM | POA: Diagnosis not present

## 2016-01-29 DIAGNOSIS — M791 Myalgia: Secondary | ICD-10-CM

## 2016-01-29 LAB — POCT INFLUENZA A/B
Influenza A, POC: POSITIVE — AB
Influenza B, POC: NEGATIVE

## 2016-01-29 MED ORDER — NAPROXEN 500 MG PO TABS: 500.0000 mg | ORAL_TABLET | Freq: Two times a day (BID) | ORAL | Status: DC

## 2016-01-29 MED ORDER — HYDROCODONE-HOMATROPINE 5-1.5 MG/5ML PO SYRP: 2.5000 mL | ORAL_SOLUTION | Freq: Every evening | ORAL | Status: DC | PRN

## 2016-01-29 MED ORDER — ACETAMINOPHEN 325 MG PO TABS
1000.0000 mg | ORAL_TABLET | Freq: Once | ORAL | Status: AC
  Administered 2016-01-29: 975 mg via ORAL

## 2016-01-29 MED ORDER — OSELTAMIVIR PHOSPHATE 75 MG PO CAPS
75.0000 mg | ORAL_CAPSULE | Freq: Two times a day (BID) | ORAL | Status: DC
Start: 2016-01-29 — End: 2016-02-03

## 2016-01-29 NOTE — Progress Notes (Signed)
01/29/2016 9:33 AM   DOB: [birth year] / MRN: [id number]  SUBJECTIVE:  Ian Hall is a 50 y.o. male presenting for for the evaluation of fever that started 2 days ago. He associates cough, chills, and muscle aches.  He denies difficulty breathing. Treatments tried thus far include several OTC preps with fair to poor relief. He reports sick contacts.  He is allergic to hydrocodone.   He  has a past medical history of Anxiety.    He  reports that he has quit smoking. He does not have any smokeless tobacco history on file. He reports that he does not drink alcohol or use illicit drugs. He  reports that he currently engages in sexual activity. He reports using the following method of birth control/protection: None. The patient  has past surgical history that includes Ankle surgery.  His family history includes Diabetes in his father and mother; Memory loss in his mother.  Review of Systems  Constitutional: Positive for fever and chills.  Respiratory: Positive for cough.   Cardiovascular: Negative for chest pain.  Musculoskeletal: Positive for myalgias.  Skin: Negative for rash.  Neurological: Positive for headaches. Negative for dizziness.    Problem list and medications reviewed and updated by myself where necessary, and exist elsewhere in the encounter.   OBJECTIVE:  BP 122/64 mmHg  Pulse 97  Temp(Src) 101.5 F (38.6 C)  Resp 18  Ht 5' 8"  (1.727 m)  Wt 225 lb (102.059 kg)  BMI 34.22 kg/m2  SpO2 98%  Physical Exam  Constitutional: He is oriented to person, place, and time. He appears well-nourished. No distress.  HENT:  Right Ear: Hearing and tympanic membrane normal.  Left Ear: Hearing and tympanic membrane normal.  Nose: Mucosal edema present. No sinus tenderness. Right sinus exhibits no maxillary sinus tenderness and no frontal sinus tenderness. Left sinus exhibits no maxillary sinus tenderness and no frontal sinus tenderness.  Mouth/Throat: Uvula is midline, oropharynx is  clear and moist and mucous membranes are normal.  Eyes: EOM are normal. Pupils are equal, round, and reactive to light.  Cardiovascular: Normal rate.   Pulmonary/Chest: Effort normal.  Abdominal: He exhibits no distension.  Neurological: He is alert and oriented to person, place, and time. No cranial nerve deficit. Skin: Skin is dry. He is not diaphoretic.  Vitals reviewed.  Results for orders placed or performed in visit on 01/29/16 (from the past 48 hour(s))  POCT Influenza A/B     Status: Abnormal   Collection Time: 01/29/16  9:23 AM  Result Value Ref Range   Influenza A, POC Positive (A) Negative   Influenza B, POC Negative Negative    ASSESSMENT AND PLAN:  Ian Hall was seen today for cough, fever, generalized body aches and ankle pain.  Diagnoses and all orders for this visit:  Influenza-like illness: Work note provided.  We are within the 48 hour treatment window.  He should do well.   -     acetaminophen (TYLENOL) tablet 975 mg; Take 3 tablets (975 mg total) by mouth once. -     POCT Influenza A/B -     HYDROcodone-homatropine (HYCODAN) 5-1.5 MG/5ML syrup; Take 2.5-5 mLs by mouth at bedtime as needed. -     naproxen (NAPROSYN) 500 MG tablet; Take 1 tablet (500 mg total) by mouth 2 (two) times daily with a meal.  Influenza A -     oseltamivir (TAMIFLU) 75 MG capsule; Take 1 capsule (75 mg total) by mouth 2 (two) times daily.  The patient was advised to call or return to clinic if he does not see an improvement in symptoms or to seek the care of the closest emergency department if he worsens with the above plan.   Philis Fendt, MHS, PA-C [facility] 01/29/2016 9:33 AM

## 2016-02-02 ENCOUNTER — Telehealth: Payer: Self-pay

## 2016-02-02 NOTE — Telephone Encounter (Signed)
Pt is requesting a refill of HYDROcodone-homatropine (HYCODAN) 5-1.5 MG/5ML syrup [facility] [address]

## 2016-02-03 ENCOUNTER — Ambulatory Visit (INDEPENDENT_AMBULATORY_CARE_PROVIDER_SITE_OTHER): Payer: Managed Care, Other (non HMO) | Admitting: Family Medicine

## 2016-02-03 VITALS — BP 124/86 | HR 80 | Temp 97.8°F | Resp 16 | Ht 68.25 in | Wt 227.6 lb

## 2016-02-03 DIAGNOSIS — J111 Influenza due to unidentified influenza virus with other respiratory manifestations: Secondary | ICD-10-CM

## 2016-02-03 DIAGNOSIS — R05 Cough: Secondary | ICD-10-CM

## 2016-02-03 DIAGNOSIS — R059 Cough, unspecified: Secondary | ICD-10-CM

## 2016-02-03 MED ORDER — HYDROCODONE-HOMATROPINE 5-1.5 MG/5ML PO SYRP: ORAL_SOLUTION | ORAL | Status: DC

## 2016-02-03 MED ORDER — PROMETHAZINE-DM 6.25-15 MG/5ML PO SYRP: 2.5000 mL | ORAL_SOLUTION | Freq: Every evening | ORAL | Status: DC | PRN

## 2016-02-03 NOTE — Patient Instructions (Addendum)
Your cough is likely still due to the influenza infection. As long as the cough is improving into next week, can use new cough syrup as needed at night. Mucinex as needed during the day. If you have increased cough, shortness of breath, fever, or other worsening symptoms, return for x-ray or other evaluation.  Return to the clinic or go to the nearest emergency room if any of your symptoms worsen or new symptoms occur.  See the letter provided for work. Should not return to work until you have no fevers off a fever reducing medicine for a full 24 hours.  The mucinex may help during the workday.  Influenza, Adult Influenza ("the flu") is a viral infection of the respiratory tract. It occurs more often in winter months because people spend more time in close contact with one another. Influenza can make you feel very sick. Influenza easily spreads from person to person (contagious). CAUSES  Influenza is caused by a virus that infects the respiratory tract. You can catch the virus by breathing in droplets from an infected person's cough or sneeze. You can also catch the virus by touching something that was recently contaminated with the virus and then touching your mouth, nose, or eyes. RISKS AND COMPLICATIONS You may be at risk for a more severe case of influenza if you smoke cigarettes, have diabetes, have chronic heart disease (such as heart failure) or lung disease (such as asthma), or if you have a weakened immune system. Elderly people and pregnant women are also at risk for more serious infections. The most common problem of influenza is a lung infection (pneumonia). Sometimes, this problem can require emergency medical care and may be life threatening. SIGNS AND SYMPTOMS  Symptoms typically last 4 to 10 days and may include:  Fever.  Chills.  Headache, body aches, and muscle aches.  Sore throat.  Chest discomfort and cough.  Poor appetite.  Weakness or feeling  tired.  Dizziness.  Nausea or vomiting. DIAGNOSIS  Diagnosis of influenza is often made based on your history and a physical exam. A nose or throat swab test can be done to confirm the diagnosis. TREATMENT  In mild cases, influenza goes away on its own. Treatment is directed at relieving symptoms. For more severe cases, your health care provider may prescribe antiviral medicines to shorten the sickness. Antibiotic medicines are not effective because the infection is caused by a virus, not by bacteria. HOME CARE INSTRUCTIONS  Take medicines only as directed by your health care provider.  Use a cool mist humidifier to make breathing easier.  Get plenty of rest until your temperature returns to normal. This usually takes 3 to 4 days.  Drink enough fluid to keep your urine clear or pale yellow.  Cover yourmouth and nosewhen coughing or sneezing,and wash your handswellto prevent thevirusfrom spreading.  Stay homefromwork orschool untilthe fever is gonefor at least 801full day. PREVENTION  An annual influenza vaccination (flu shot) is the best way to avoid getting influenza. An annual flu shot is now routinely recommended for all adults in the [place] SEEK MEDICAL CARE IF:  You experiencechest pain, yourcough worsens,or you producemore mucus.  Youhave nausea,vomiting, ordiarrhea.  Your fever returns or gets worse. SEEK IMMEDIATE MEDICAL CARE IF:  You havetrouble breathing, you become short of breath,or your skin ornails becomebluish.  You have severe painor stiffnessin the neck.  You develop a sudden headache, or pain in the face or ear.  You have nausea or vomiting that you cannot control.  MAKE SURE YOU:   Understand these instructions.  Will watch your condition.  Will get help right away if you are not doing well or get worse.   This information is not intended to replace advice given to you by your health care provider. Make sure you discuss any  questions you have with your health care provider.   Document Released: 11/12/2000 Document Revised: 12/06/2014 Document Reviewed: 02/14/2012 Elsevier Interactive Patient Education 2016 Elsevier Inc.    Cough, Adult Coughing is a reflex that clears your throat and your airways. Coughing helps to heal and protect your lungs. It is normal to cough occasionally, but a cough that happens with other symptoms or lasts a long time may be a sign of a condition that needs treatment. A cough may last only 2-3 weeks (acute), or it may last longer than 8 weeks (chronic). CAUSES Coughing is commonly caused by:  Breathing in substances that irritate your lungs.  A viral or bacterial respiratory infection.  Allergies.  Asthma.  Postnasal drip.  Smoking.  Acid backing up from the stomach into the esophagus (gastroesophageal reflux).  Certain medicines.  Chronic lung problems, including COPD (or rarely, lung cancer).  Other medical conditions such as heart failure. HOME CARE INSTRUCTIONS  Pay attention to any changes in your symptoms. Take these actions to help with your discomfort:  Take medicines only as told by your health care provider.  If you were prescribed an antibiotic medicine, take it as told by your health care provider. Do not stop taking the antibiotic even if you start to feel better.  Talk with your health care provider before you take a cough suppressant medicine.  Drink enough fluid to keep your urine clear or pale yellow.  If the air is dry, use a cold steam vaporizer or humidifier in your bedroom or your home to help loosen secretions.  Avoid anything that causes you to cough at work or at home.  If your cough is worse at night, try sleeping in a semi-upright position.  Avoid cigarette smoke. If you smoke, quit smoking. If you need help quitting, ask your health care provider.  Avoid caffeine.  Avoid alcohol.  Rest as needed. SEEK MEDICAL CARE IF:   You  have new symptoms.  You cough up pus.  Your cough does not get better after 2-3 weeks, or your cough gets worse.  You cannot control your cough with suppressant medicines and you are losing sleep.  You develop pain that is getting worse or pain that is not controlled with pain medicines.  You have a fever.  You have unexplained weight loss.  You have night sweats. SEEK IMMEDIATE MEDICAL CARE IF:  You cough up blood.  You have difficulty breathing.  Your heartbeat is very fast.   This information is not intended to replace advice given to you by your health care provider. Make sure you discuss any questions you have with your health care provider.   Document Released: 05/14/2011 Document Revised: 08/06/2015 Document Reviewed: 01/22/2015 Elsevier Interactive Patient Education [employer]

## 2016-02-03 NOTE — Telephone Encounter (Signed)
Pt here to be seen

## 2016-02-03 NOTE — Progress Notes (Addendum)
Subjective:  This chart was scribed for [name] MD, by [name], at [facility].  This patient was seen in room  13 and the patient's care was started at 11:13 AM.   Chief Complaint  Patient presents with  . Cough     Patient ID: Ian Hall, male    DOB: 24-Jun-1966, 50 y.o.   MRN: [id number]  HPI  HPI Comments: [name] is a 50 y.o. male who presents to the [facility] for a follow up regarding his cough.  Patient was daignosed with influenza A 5 days ago and was treated with hydrocodone cough syrup and Tamiflu.  Patient states that he is now coughing more frequently at nights but does not think his cough has worsened since his last visit. He has run out of the cough syrup recently. Prior to that, he has been compliant with his cough syrup at night time before bed and takes over the counter cough drops during the day.  He has not measured his temperature but does feel that he is intermittently warm at times.  Denies any shortness of breath/ loss of appetite  or difficulty breathing. 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  Patient has a note that is excuses him from March 2nd to March 13th but states that his company does not accept this as they think he would only need 3-4 days to return from the flu.     At the completion of visit, he noted that he has had some itching in his throat. States this was there prior to starting hydrocodone, does have hydrocodone allergy with itching in the past, but denies any recent new itching with the previous hydrocodone cough syrup.  There are no active problems to display for this patient.  Past Medical History  Diagnosis Date  . Anxiety    Past Surgical History  Procedure Laterality Date  . Ankle surgery     Allergies  Allergen Reactions  . Hydrocodone Itching    Noted with hydrocodone cough syrup. Itching only, no hives.    Prior to Admission  medications   Medication Sig Start Date End Date Taking? Authorizing Provider  HYDROcodone-homatropine (HYCODAN) 5-1.5 MG/5ML syrup Take 2.5-5 mLs by mouth at bedtime as needed. Patient not taking: Reported on 02/03/2016 01/29/16   Ofilia Neas, PA-C  naproxen (NAPROSYN) 500 MG tablet Take 1 tablet (500 mg total) by mouth 2 (two) times daily with a meal. Patient not taking: Reported on 02/03/2016 01/29/16   Ofilia Neas, PA-C  oseltamivir (TAMIFLU) 75 MG capsule Take 1 capsule (75 mg total) by mouth 2 (two) times daily. Patient not taking: Reported on 02/03/2016 01/29/16   Ofilia Neas, PA-C   Social History   Social History  . Marital Status: Married    Spouse Name: N/A  . Number of Children: N/A  . Years of Education: N/A   Occupational History  . Administrator   Social History Main Topics  . Smoking status: Former Games developer  . Smokeless tobacco: Not on file  . Alcohol Use: No  . Drug Use: No  . Sexual Activity: Yes    Birth Control/ Protection: None   Other Topics Concern  . Not on file   Social History Narrative    Review of Systems  Constitutional: Negative for fever and chills.  Respiratory: Positive for cough. Negative for choking and shortness  of breath.        Objective:   Physical Exam  Constitutional: He is oriented to person, place, and time. He appears well-developed and well-nourished. No distress.  HENT:  Head: Normocephalic and atraumatic.  Right Ear: Tympanic membrane, external ear and ear canal normal.  Left Ear: Tympanic membrane, external ear and ear canal normal.  Nose: No rhinorrhea.  Mouth/Throat: Oropharynx is clear and moist and mucous membranes are normal. No oropharyngeal exudate or posterior oropharyngeal erythema.  Eyes: Conjunctivae and EOM are normal. Pupils are equal, round, and reactive to light.  Neck: Neck supple. No JVD present. Carotid bruit is not present.  Cardiovascular: Normal rate, regular rhythm, normal  heart sounds and intact distal pulses.   No murmur heard. Pulmonary/Chest: Effort normal and breath sounds normal. No respiratory distress. He has no wheezes. He has no rhonchi. He has no rales.  1 course breath sound left lower lung but otherwise clear, breathing normally.   Abdominal: Soft. There is no tenderness.  Musculoskeletal: He exhibits no edema.  Lymphadenopathy:    He has no cervical adenopathy.  Neurological: He is alert and oriented to person, place, and time.  Skin: Skin is warm and dry. No rash noted.  Psychiatric: He has a normal mood and affect. His behavior is normal.  Vitals reviewed.  Filed Vitals:   02/03/16 1051  BP: 124/86  Pulse: 80  Temp: 97.8 F (36.6 C)  TempSrc: Oral  Resp: 16  Height: 5' 8.25" (1.734 m)  Weight: 227 lb 9.6 oz (103.239 kg)  SpO2: 98%       Assessment & Plan:   [name] is a 50 y.o. male Cough - Plan: HYDROcodone-homatropine (HYCODAN) 5-1.5 MG/5ML syrup  Influenza with respiratory manifestation - Plan: HYDROcodone-homatropine (HYCODAN) 5-1.5 MG/5ML syrup  Influenza with cough. Afebrile, normal O2 sat, overall lungs are clear with one faint coarse breath sounds left lower lobe. With normal temp and other exam, doubt pneumonia, suspected postinfectious cough with influenza.  -Mucinex over-the-counter during the day, phenergan DM at night if needed. Decided to stop hydrocodone cough syrup with his complaints of itching in the throat, but it appears these may have been symptoms of his initial infection,   -Note given for work, can return to work as his symptoms allow this week but should not return unless he has been afebrile for full 24 hours off  fever reducing medicines.  -RTC precautions discussed if any increasing cough, fever, or any worsening. Understanding expressed.  Meds ordered this encounter  Medications  . DISCONTD: HYDROcodone-homatropine (HYCODAN) 5-1.5 MG/5ML syrup    Sig: 46m by mouth a bedtime as needed for cough.     Dispense:  120 mL    Refill:  0  . promethazine-dextromethorphan (PROMETHAZINE-DM) 6.25-15 MG/5ML syrup    Sig: Take 2.5-5 mLs by mouth at bedtime as needed for cough.    Dispense:  118 mL    Refill:  0   Patient Instructions  Your cough is likely still due to the influenza infection. As long as he cough is improving into next week, can continue hydrocodone cough syrup as needed at night. mucinex as needed during the day. If you have increased cough, shortness of breath, fever, or other worsening symptoms, return for x-ray or other evaluation. Return to the clinic or go to the nearest emergency room if any of your symptoms worsen or new symptoms occur.  See the letter provided for work. Should not return to work until you have no  fevers off a fever reducing medicine for a full 24 hours.  The mucinex may help during the workday.  Influenza, Adult Influenza ("the flu") is a viral infection of the respiratory tract. It occurs more often in winter months because people spend more time in close contact with one another. Influenza can make you feel very sick. Influenza easily spreads from person to person (contagious). CAUSES  Influenza is caused by a virus that infects the respiratory tract. You can catch the virus by breathing in droplets from an infected person's cough or sneeze. You can also catch the virus by touching something that was recently contaminated with the virus and then touching your mouth, nose, or eyes. RISKS AND COMPLICATIONS You may be at risk for a more severe case of influenza if you smoke cigarettes, have diabetes, have chronic heart disease (such as heart failure) or lung disease (such as asthma), or if you have a weakened immune system. Elderly people and pregnant women are also at risk for more serious infections. The most common problem of influenza is a lung infection (pneumonia). Sometimes, this problem can require emergency medical care and may be life  threatening. SIGNS AND SYMPTOMS  Symptoms typically last 4 to 10 days and may include:  Fever.  Chills.  Headache, body aches, and muscle aches.  Sore throat.  Chest discomfort and cough.  Poor appetite.  Weakness or feeling tired.  Dizziness.  Nausea or vomiting. DIAGNOSIS  Diagnosis of influenza is often made based on your history and a physical exam. A nose or throat swab test can be done to confirm the diagnosis. TREATMENT  In mild cases, influenza goes away on its own. Treatment is directed at relieving symptoms. For more severe cases, your health care provider may prescribe antiviral medicines to shorten the sickness. Antibiotic medicines are not effective because the infection is caused by a virus, not by bacteria. HOME CARE INSTRUCTIONS  Take medicines only as directed by your health care provider.  Use a cool mist humidifier to make breathing easier.  Get plenty of rest until your temperature returns to normal. This usually takes 3 to 4 days.  Drink enough fluid to keep your urine clear or pale yellow.  Cover yourmouth and nosewhen coughing or sneezing,and wash your handswellto prevent thevirusfrom spreading.  Stay homefromwork orschool untilthe fever is gonefor at least 61full day. PREVENTION  An annual influenza vaccination (flu shot) is the best way to avoid getting influenza. An annual flu shot is now routinely recommended for all adults in the [place] SEEK MEDICAL CARE IF:  You experiencechest pain, yourcough worsens,or you producemore mucus.  Youhave nausea,vomiting, ordiarrhea.  Your fever returns or gets worse. SEEK IMMEDIATE MEDICAL CARE IF:  You havetrouble breathing, you become short of breath,or your skin ornails becomebluish.  You have severe painor stiffnessin the neck.  You develop a sudden headache, or pain in the face or ear.  You have nausea or vomiting that you cannot control. MAKE SURE YOU:   Understand these  instructions.  Will watch your condition.  Will get help right away if you are not doing well or get worse.   This information is not intended to replace advice given to you by your health care provider. Make sure you discuss any questions you have with your health care provider.   Document Released: 11/12/2000 Document Revised: 12/06/2014 Document Reviewed: 02/14/2012 Elsevier Interactive Patient Education 2016 Elsevier Inc.    Cough, Adult Coughing is a reflex that clears your throat and  your airways. Coughing helps to heal and protect your lungs. It is normal to cough occasionally, but a cough that happens with other symptoms or lasts a long time may be a sign of a condition that needs treatment. A cough may last only 2-3 weeks (acute), or it may last longer than 8 weeks (chronic). CAUSES Coughing is commonly caused by:  Breathing in substances that irritate your lungs.  A viral or bacterial respiratory infection.  Allergies.  Asthma.  Postnasal drip.  Smoking.  Acid backing up from the stomach into the esophagus (gastroesophageal reflux).  Certain medicines.  Chronic lung problems, including COPD (or rarely, lung cancer).  Other medical conditions such as heart failure. HOME CARE INSTRUCTIONS  Pay attention to any changes in your symptoms. Take these actions to help with your discomfort:  Take medicines only as told by your health care provider.  If you were prescribed an antibiotic medicine, take it as told by your health care provider. Do not stop taking the antibiotic even if you start to feel better.  Talk with your health care provider before you take a cough suppressant medicine.  Drink enough fluid to keep your urine clear or pale yellow.  If the air is dry, use a cold steam vaporizer or humidifier in your bedroom or your home to help loosen secretions.  Avoid anything that causes you to cough at work or at home.  If your cough is worse at night, try  sleeping in a semi-upright position.  Avoid cigarette smoke. If you smoke, quit smoking. If you need help quitting, ask your health care provider.  Avoid caffeine.  Avoid alcohol.  Rest as needed. SEEK MEDICAL CARE IF:   You have new symptoms.  You cough up pus.  Your cough does not get better after 2-3 weeks, or your cough gets worse.  You cannot control your cough with suppressant medicines and you are losing sleep.  You develop pain that is getting worse or pain that is not controlled with pain medicines.  You have a fever.  You have unexplained weight loss.  You have night sweats. SEEK IMMEDIATE MEDICAL CARE IF:  You cough up blood.  You have difficulty breathing.  Your heartbeat is very fast.   This information is not intended to replace advice given to you by your health care provider. Make sure you discuss any questions you have with your health care provider.   Document Released: 05/14/2011 Document Revised: 08/06/2015 Document Reviewed: 01/22/2015 Elsevier Interactive Patient Education [employer]     I personally performed the services described in this documentation, which was scribed in my presence. The recorded information has been reviewed and considered, and addended by me as needed.

## 2016-02-03 NOTE — Addendum Note (Signed)
Addended by: Meredith StaggersGREENE, Tennille Montelongo R on: 02/03/2016 11:48 AM   Modules accepted: Orders, Medications

## 2016-02-07 ENCOUNTER — Ambulatory Visit (INDEPENDENT_AMBULATORY_CARE_PROVIDER_SITE_OTHER): Payer: Managed Care, Other (non HMO)

## 2016-02-07 ENCOUNTER — Ambulatory Visit (INDEPENDENT_AMBULATORY_CARE_PROVIDER_SITE_OTHER): Payer: Managed Care, Other (non HMO) | Admitting: Family Medicine

## 2016-02-07 VITALS — BP 120/80 | HR 111 | Temp 99.0°F | Resp 17 | Ht 68.5 in | Wt 222.0 lb

## 2016-02-07 DIAGNOSIS — J441 Chronic obstructive pulmonary disease with (acute) exacerbation: Secondary | ICD-10-CM

## 2016-02-07 DIAGNOSIS — J189 Pneumonia, unspecified organism: Secondary | ICD-10-CM | POA: Diagnosis not present

## 2016-02-07 DIAGNOSIS — H6123 Impacted cerumen, bilateral: Secondary | ICD-10-CM | POA: Diagnosis not present

## 2016-02-07 MED ORDER — LEVOFLOXACIN 500 MG PO TABS: 500.0000 mg | ORAL_TABLET | Freq: Every day | ORAL | Status: DC

## 2016-02-07 NOTE — Patient Instructions (Addendum)
IF you received an x-ray today, you will receive an invoice from [facility]. Please contact [facility] at [phone] with questions or concerns regarding your invoice.   IF you received labwork today, you will receive an invoice from [facility]. Please contact [facility] at [phone] with questions or concerns regarding your invoice.   Our billing staff will not be able to assist you with questions regarding bills from these companies.  You will be contacted with the lab results as soon as they are available. The fastest way to get your results is to activate your My Chart account. Instructions are located on the last page of this paperwork. If you have not heard from us regarding the results in 2 weeks, please contact this office.  Neumona extrahospitalaria en los adultos (Community-Acquired Pneumonia, Adult) La neumona es una infeccin en los pulmones. Hay diferentes tipos de neumona. Uno de ellos puede desarrollarse mientras una persona est en el hospital. Un tipo diferente, llamado neumona extrahospitalaria, evoluciona en las personas que no estn o no han estado recientemente en el hospital u otro centro de [place].  CAUSAS La neumona puede ser bacteriana, viral o mictica. Con frecuencia, la causa de la neumona extrahospitalaria es la bacteria Streptococcus pneumoniae. Estas bacterias suelen transmitirse de [place] persona a otra al Duke Energyinhalar las gotitas que un individuo infectado libera al toser o Engineering geologistestornudar. FACTORES DE RIESGO Es ms probable que la enfermedad se manifieste en:  Las personas que padecen enfermedades crnicas, como enfermedad pulmonar obstructiva crnica (EPOC), asma, insuficiencia cardaca congestiva, fibroso qustica, diabetes o enfermedad renal.  Las personas que tienen el VIH en etapa inicial o avanzada.  Las personas que tienen anemia drepanoctica.  Las personas a las que se les extrajo el bazo  (esplenectoma).  Las personas cuya higiene dental es deficiente.  Las personas que sufren enfermedades que aumentan el riesgo de Research scientist (medical)inspirar (aspirar) las secreciones de la propia boca y Architectural technologistla nariz.   Las personas cuyo sistema inmunitario est debilitado (inmunodeprimido).  Los fumadores.  Las personas que viajan a regiones donde es frecuente la existencia de los grmenes que causan la neumona.  Las personas que tienen contacto con hbitat de [place]animales o con animales que son portadores de los grmenes que causan neumona, entre ellos, pjaros, murcilagos, conejos, gatos y [place] Pointanimales de [place]gran[place]. SNTOMAS Los sntomas de esta enfermedad incluyen lo siguiente:  Tos seca.  Tos con expectoracin (productiva).  Grant RutsFiebre.  Sudoracin.  Dolor de pecho, especialmente al respirar profundamente o al toser.  Respiracin rpida o dificultad para respirar.  Falta de aire.  Escalofros.  Fatiga.  Dolores musculares. DIAGNSTICO El mdico le har una historia clnica y un examen fsico. Tambin pueden hacerle otros estudios, por ejemplo:  Estudios de diagnstico por imgenes, entre ellos, radiografas.  Anlisis para controlar el nivel de oxgeno y de otros gases en la sangre.  Otros anlisis de Cary[place]angre, de la mucosidad (esputo), el lquido que rodea los pulmones (lquido pleural) y Ecologistla orina. Si la neumona es grave, se pueden hacer otros estudios para identificar la causa especfica de la enfermedad. TRATAMIENTO El tipo de tratamiento que se administra depende de muchos factores, por ejemplo, la causa de la neumona, los medicamentos que toma y otras enfermedades que padezca. En el caso de la Commercial Metals Companymayora de los adultos, el tratamiento de la neumona y la recuperacin pueden hacerse en la casa. En algunos casos, el tratamiento debe administrarse en un hospital. El tratamiento puede incluir lo siguiente:  Antibiticos, si la neumona fue  Antibiticos, si la neumona fue causada por bacterias.  Antivirales, si la neumona fue  causada por un virus.  Medicamentos que se administran por va oral o por va intravenosa (IV).  Oxgeno.  Terapia respiratoria. Aunque es poco frecuente, el tratamiento de la neumona grave puede incluir lo siguiente:  Asistencia respiratoria mecnica. Este tratamiento se realiza si no respira bien por s solo y no puede mantener un nivel de oxgeno adecuado.  Toracocentesis. Este procedimiento se realiza para extraer el lquido que rodea uno o ambos pulmones, a fin de mejorar la respiracin. INSTRUCCIONES PARA EL CUIDADO EN EL HOGAR  Tome los medicamentos de venta libre y los recetados solamente como se lo haya indicado el mdico.  Tome los medicamentos para la tos solamente si no puede dormir bien. Debe entender que los medicamentos para la tos pueden minar la capacidad natural del organismo de eliminar la mucosidad de los pulmones.  Si le recetaron un antibitico, tmelo como se lo haya indicado el mdico. No deje de tomar los antibiticos aunque comience a sentirse mejor.  De noche, duerma semisentado. Intente dormir en un silln reclinable o pngase algunas almohadas debajo de la cabeza.  No consuma productos que contengan tabaco, incluidos cigarrillos, tabaco de mascar y cigarrillos electrnicos. Si necesita ayuda para dejar de fumar, consulte al mdico.  Beba suficiente agua para mantener la orina clara o de color amarillo plido. Esto ayudar a fluidificar las secreciones mucosas de los pulmones. PREVENCIN Existen formas de reducir el riesgo de tener neumona extrahospitalaria. Considere la posibilidad de aplicarse la vacuna antineumoccica rene estos requisitos:  Es mayor de 65aos.  Es mayor de 19aos y est recibiendo tratamiento oncolgico, tiene enfermedad pulmonar crnica u otros trastornos que le afectan el sistema inmunitario. Pregntele al mdico si esto es vlido para su caso. Hay diferentes tipos de vacunas antineumoccicas y cronogramas para su aplicacin.  Pregntele al mdico cul es la opcin de vacunacin ms adecuada para usted. Tambin puede evitar contraer neumona extrahospitalaria si toma las siguientes medidas:  Se aplica la vacuna antigripal todos los aos. Pregntele al mdico cul es el tipo de vacuna antigripal ms adecuado para usted.  Visita al dentista peridicamente.  Se lava las manos con frecuencia. Utilice un desinfectante para manos si no dispone de agua y jabn. SOLICITE ATENCIN MDICA SI:  Tiene fiebre.  No duerme bien porque no es posible controlar la tos con medicamentos para la tos. SOLICITE ATENCIN MDICA DE INMEDIATO SI:  Experimenta un empeoramiento en la falta de aire.  El dolor de pecho es cada vez ms intenso.  La enfermedad empeora, especialmente si usted es un adulto mayor o su sistema inmunitario est debilitado.  Tose y escupe sangre.   Esta informacin no tiene como fin reemplazar el consejo del mdico. Asegrese de hacerle al mdico cualquier pregunta que tenga.   Document Released: 08/25/2005 Document Revised: 08/06/2015 Elsevier Interactive Patient Education 2016 Elsevier Inc.  

## 2016-02-07 NOTE — Progress Notes (Signed)
[age] yo man initially developed illness February 28th.  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  He has been coughing ever since.  He had epistaxis this morning.  He is married with 50 yo child who is sick at home.  Patient is former smoker.  He has had asthma and bronchitis.  Objective:  Appears tired  HEENT:  Cerumen impaction Chest:  Rales left base, wheezes bilaterally Heart:  Regular, no murmur Ext:  No edema   CXR shows LLL pneumonia  This chart was scribed in my presence and reviewed by me personally.    ICD-9-CM ICD-10-CM   1. Bronchitis, chronic obstructive, with exacerbation (HCC) 491.21 J44.1 DG Chest 2 View  2. Cerumen impaction, bilateral 380.4 H61.23 Ear wax removal  3. CAP (community acquired pneumonia) 486 J18.9 levofloxacin (LEVAQUIN) 500 MG tablet     Signed, Elvina SidleKurt Naelle Diegel, MD

## 2016-02-09 ENCOUNTER — Telehealth: Payer: Self-pay

## 2016-02-09 NOTE — Telephone Encounter (Signed)
Patient needs forms filled out for FMLA time for testing positive for the flu and missing work since 01/29/16. I have filled out what I can from the [place] notes and highlighted the areas that need to be completed. I will place in [employer] on 02/13/16 please return to the FMLA/Disability box at the 102 checkout desk within 5-7 business days. Thank you!

## 2016-02-16 NOTE — Telephone Encounter (Signed)
Patient came into clinic today, wanting to check on the status of his FMLA forms, he was informed that today 02/16/16 was the 5th business day and that the papers were in the PA.'s box waiting to be completed. I did let him know that I would send the PA a message to check the status. He stated that the forms need to be sent in by Thursday 02/19/16. So do we think that we will have them completed by this date? Please let me know thank you!

## 2016-02-16 NOTE — Telephone Encounter (Signed)
Will work on this first thing Wednesday morning.

## 2016-02-19 NOTE — Telephone Encounter (Signed)
Paperwork scanned and faxed to [employer] on 02/19/16

## 2016-02-23 NOTE — Telephone Encounter (Signed)
That's correct.  I did this as a medication refill and advised that I would have to see her back in the office. Please let her know.

## 2016-06-04 IMAGING — CR DG CHEST 2V
2 series · 2 of 2 positions shown · non-contrast
Comparison: Two-view chest x-ray 08/13/2015.

CLINICAL DATA: Bronchitis.

EXAM:
CHEST - 2 VIEW

[PA]
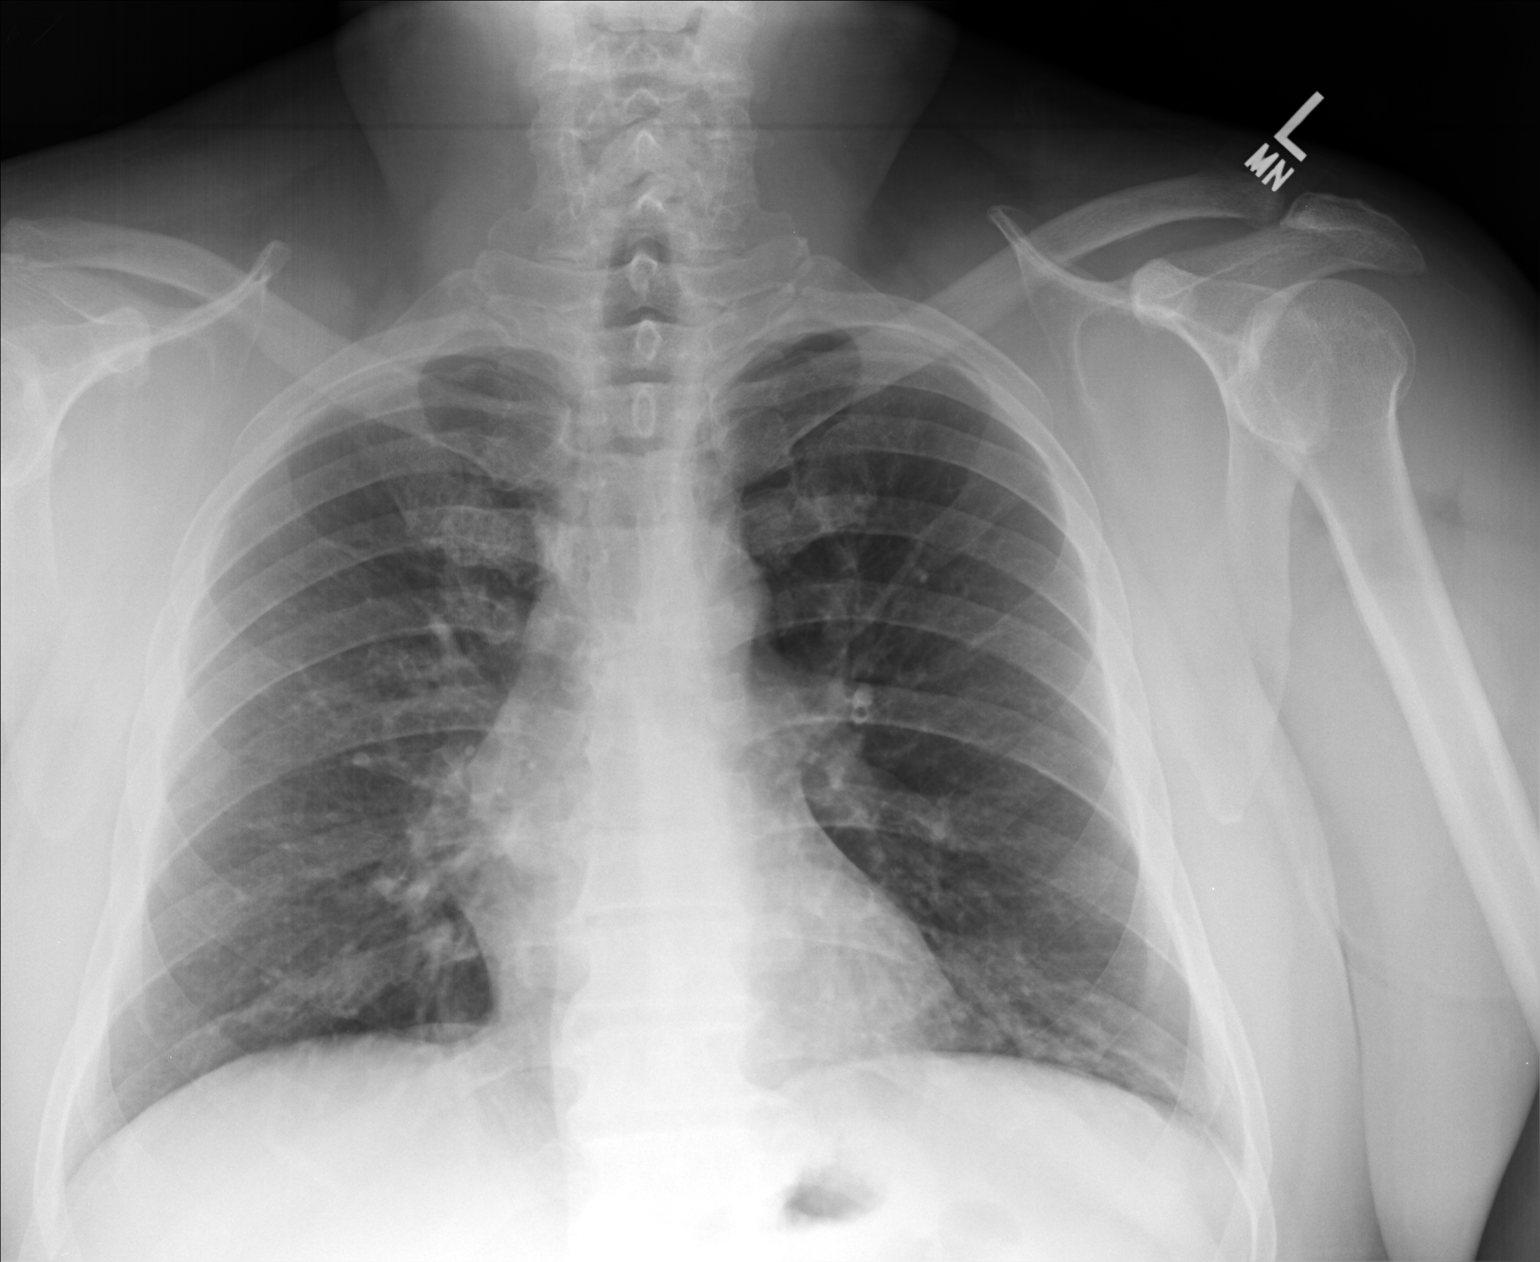

[lateral]
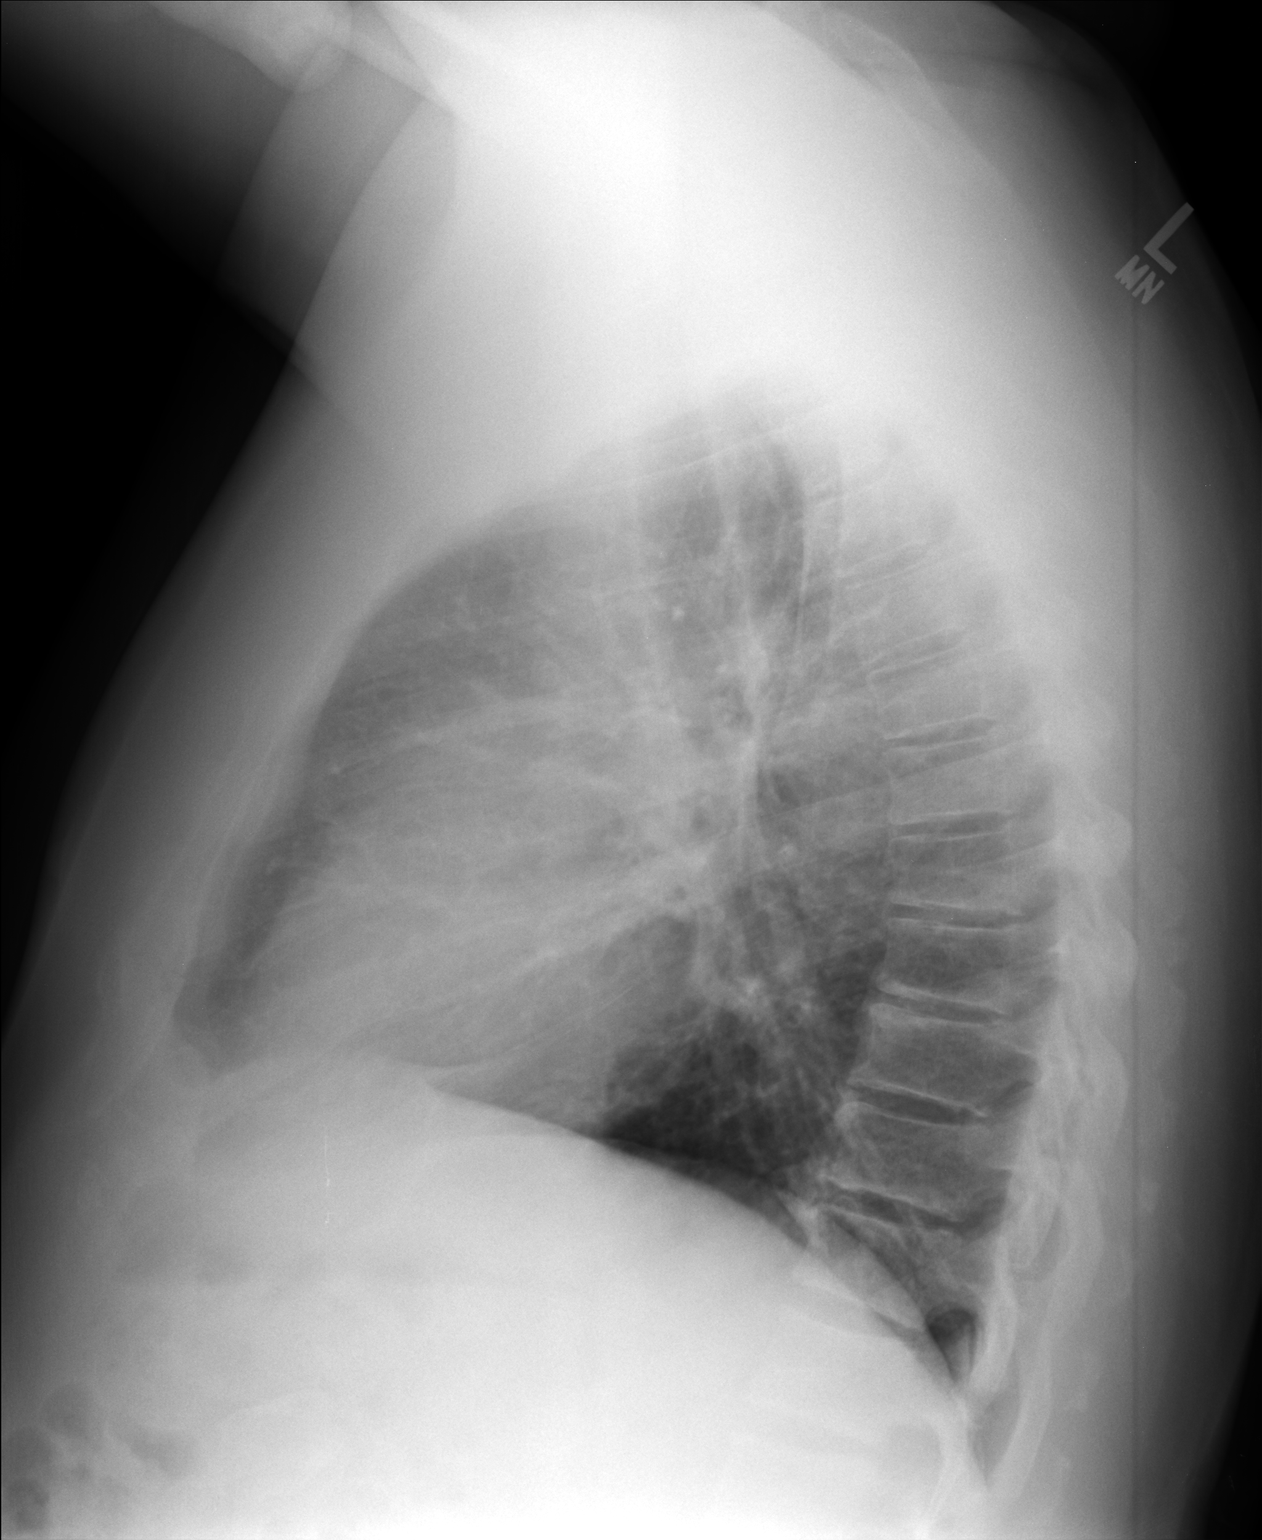

[2 of 2 positions shown; findings below may reference images not displayed]

FINDINGS: The heart size is normal. Progressive left basilar airspace disease
is present. The right lung is clear. Mild interstitial coarsening is
otherwise stable.
IMPRESSION: 1. New left lower lobe pneumonia.
2. Persistent low lung volumes and mild interstitial coarsening.

## 2016-07-31 ENCOUNTER — Ambulatory Visit (INDEPENDENT_AMBULATORY_CARE_PROVIDER_SITE_OTHER): Payer: Managed Care, Other (non HMO) | Admitting: Physician Assistant

## 2016-07-31 VITALS — BP 130/100 | HR 73 | Temp 97.6°F | Resp 18 | Ht 68.0 in | Wt 227.5 lb

## 2016-07-31 DIAGNOSIS — L309 Dermatitis, unspecified: Secondary | ICD-10-CM

## 2016-07-31 DIAGNOSIS — R300 Dysuria: Secondary | ICD-10-CM

## 2016-07-31 DIAGNOSIS — L918 Other hypertrophic disorders of the skin: Secondary | ICD-10-CM

## 2016-07-31 LAB — POCT URINALYSIS DIP (MANUAL ENTRY)
BILIRUBIN UA: NEGATIVE
Bilirubin, UA: NEGATIVE
Blood, UA: NEGATIVE
Glucose, UA: NEGATIVE
Leukocytes, UA: NEGATIVE
Nitrite, UA: NEGATIVE
PH UA: 7
Protein Ur, POC: NEGATIVE
SPEC GRAV UA: 1.015
Urobilinogen, UA: 0.2

## 2016-07-31 LAB — POC MICROSCOPIC URINALYSIS (UMFC): MUCUS RE: ABSENT

## 2016-07-31 MED ORDER — ZINC OXIDE 10 % EX CREA: 1.0000 "application " | TOPICAL_CREAM | Freq: Two times a day (BID) | CUTANEOUS | 1 refills | Status: DC

## 2016-07-31 MED ORDER — NYSTATIN 100000 UNIT/GM EX POWD: Freq: Every day | CUTANEOUS | 1 refills | Status: DC | PRN

## 2016-07-31 NOTE — Patient Instructions (Addendum)
  Uncover after 24 hours.  Once healed, you can use a powdered deodorant, or non-gel.  You may also want to look into a natural deodorant, as the aluminum may be an irritant. Place the prescripted cream at the buttocks.  If you are going to be sweating a lot, you can use the nystatin powder in the area.  This will help maintain dryness.   You may come back after 1 month for more areas removed.     IF you received an x-ray today, you will receive an invoice from [facility]. Please contact [facility] at [phone] with questions or concerns regarding your invoice.   IF you received labwork today, you will receive an invoice from [employer]. Please contact [facility] at [phone] with questions or concerns regarding your invoice.   Our billing staff will not be able to assist you with questions regarding bills from these companies.  You will be contacted with the lab results as soon as they are available. The fastest way to get your results is to activate your My Chart account. Instructions are located on the last page of this paperwork. If you have not heard from [place] regarding the results in 2 weeks, please contact this office.

## 2016-07-31 NOTE — Progress Notes (Addendum)
Patient ID: Ian Hall, male   DOB: [birth year], 50 y.o.   MRN: [id number] [facility] [address], [place] [address] [phone]  Date:  07/31/2016   Name:  Ian Hall   DOB:  [birth year]   MRN:  [id number]  PCP:  No PCP Per Patient   By signing my name below, I, Essence Howell, attest that this documentation has been prepared under the direction and in the presence of Ivar Drape, PA-C Electronically Signed: Ladene Artist, ED Scribe 07/31/2016 at 8:56 AM.  History of Present Illness:  Ian Hall is a 50 y.o. male patient who presents to [facility] for removal of multiple skin tags from both axillas. Pt states that he has had the skin tags for a "long time". He denies pain surrounding the area. He also states that he has had similar skin tags removed from his neck.   Pt also presents with a red rash surrounding his anus that he attributes to irritation from sweating profusely at work. He reports a burning sensation to the rash. Pt denies fever or pus from the rash. He reports similar rash in the past and states that he tried a topical jelly after showering with temporary relief.   There are no active problems to display for this patient.  Past Medical History:  Diagnosis Date   Anxiety     Past Surgical History:  Procedure Laterality Date   ANKLE SURGERY      Social History  Substance Use Topics   Smoking status: Former Smoker   Smokeless tobacco: Not on file   Alcohol use No    Family History  Problem Relation Age of Onset   Diabetes Mother    Memory loss Mother    Diabetes Father     Allergies  Allergen Reactions   Hydrocodone Itching    Noted with hydrocodone cough syrup. Itching only, no hives.  02/03/16: denied itching with recent hydrocodone cough syrup. Only one episode of itching previously.     Medication list has been reviewed and updated.  No current outpatient prescriptions on file prior to visit.   No current  facility-administered medications on file prior to visit.     Review of Systems  Constitutional: Negative for fever.  Skin: Positive for rash.    Physical Examination: BP (!) 130/100    Pulse 73    Temp 97.6 F (36.4 C) (Oral)    Resp 18    Ht 5' 8"  (1.727 m)    Wt 227 lb 8 oz (103.2 kg)    SpO2 96%    BMI 34.59 kg/m  Ideal Body Weight: @FLOWAMB ([id number])@  Physical Exam  Constitutional: He is oriented to person, place, and time. He appears well-developed and well-nourished. No distress.  HENT:  Head: Normocephalic and atraumatic.  Eyes: Conjunctivae and EOM are normal. Pupils are equal, round, and reactive to light.  Cardiovascular: Normal rate.   Pulmonary/Chest: Effort normal. No respiratory distress.  Genitourinary:  Genitourinary Comments: Chaperone present. Surrounding the anal sphincter is mild erythema and less hair in this area consistent with the irritation that he has noted. This travels down to the base of the scrotum.   Neurological: He is alert and oriented to person, place, and time.  Skin: Skin is warm and dry. He is not diaphoretic.  Multiple clustered skin tags--pedunculated lesions at the armpit. These were more prominent on the R side then L. hyperpigmented and thickened skin in the armpit.  Psychiatric: He has a normal mood and affect. His behavior is normal.   Procedure: Verbal consent obtained. Area cleansed with alcohol swabs. 1% lidocaine with epi placed at the base of the pedunculated skin lesions. Starting at the right axillary, the areas were cleansed with povidone-iodine swabs. Forceps were used to grasp the skin tag and iris scissors used to remove the skin tag at the base. 25 skin tags were removed in this fashion along the right and left axillary. Drysol was applied to control bleeding successfully. Areas cleansed with normal saline. Dressings placed. Assessment and Plan: Ian Hall is a 50 y.o. male who is here today for anal/buttock skin  irritation and skin tag removal.  Advised to place a zinc oxide at the area. Advised to discontinue using moisture treatments. He will also use nystatin powder when he is working in warm climate and prone to sweat. Advised to return in 1-2 weeks if symptoms do not improve. Skin tags were removed successfully though there are more. He'll return in 3-6 months for more removals if desired.  Skin tag  Dysuria - Plan: POCT Microscopic Urinalysis ([facility]), POCT urinalysis dipstick  Dermatitis - Plan: ZINC OXIDE, TOPICAL, 10 % CREA, nystatin (MYCOSTATIN/NYSTOP) powder  Ivar Drape, PA-C [facility] 07/31/2016 8:56 AM

## 2016-08-14 ENCOUNTER — Other Ambulatory Visit: Payer: Self-pay | Admitting: Physician Assistant

## 2016-08-14 DIAGNOSIS — L309 Dermatitis, unspecified: Secondary | ICD-10-CM

## 2016-08-31 ENCOUNTER — Ambulatory Visit (INDEPENDENT_AMBULATORY_CARE_PROVIDER_SITE_OTHER): Payer: Self-pay | Admitting: Urgent Care

## 2016-08-31 VITALS — BP 114/70 | HR 75 | Temp 98.3°F | Resp 17 | Ht 68.5 in | Wt 228.0 lb

## 2016-08-31 DIAGNOSIS — Z024 Encounter for examination for driving license: Secondary | ICD-10-CM

## 2016-08-31 NOTE — Patient Instructions (Addendum)
Keeping you healthy  Get these tests  Blood pressure- Have your blood pressure checked once a year by your healthcare provider.  Normal blood pressure is 120/80.  Weight- Have your body mass index (BMI) calculated to screen for obesity.  BMI is a measure of body fat based on height and weight. You can also calculate your own BMI at www.nhlbisupport.com/bmi/.  Cholesterol- Have your cholesterol checked regularly starting at age 50, sooner may be necessary if you have diabetes, high blood pressure, if a family member developed heart diseases at an early age or if you smoke.   Chlamydia, HIV, and other sexual transmitted disease- Get screened each year until the age of 25 then within three months of each new sexual partner.  Diabetes- Have your blood sugar checked regularly if you have high blood pressure, high cholesterol, a family history of diabetes or if you are overweight.  Get these vaccines  Flu shot- Every fall.  Tetanus shot- Every 10 years.  Menactra- Single dose; prevents meningitis.  Take these steps  Don't smoke- If you do smoke, ask your healthcare provider about quitting. For tips on how to quit, go to www.smokefree.gov or call 1-800-QUIT-NOW.  Be physically active- Exercise 5 days a week for at least 30 minutes.  If you are not already physically active start slow and gradually work up to 30 minutes of moderate physical activity.  Examples of moderate activity include walking briskly, mowing the yard, dancing, swimming bicycling, etc.  Eat a healthy diet- Eat a variety of healthy foods such as fruits, vegetables, low fat milk, low fat cheese, yogurt, lean meats, poultry, fish, beans, tofu, etc.  For more information on healthy eating, go to www.thenutritionsource.org  Drink alcohol in moderation- Limit alcohol intake two drinks or less a day.  Never drink and drive.  Dentist- Brush and floss teeth twice daily; visit your dentis twice a year.  Depression-Your emotional  health is as important as your physical health.  If you're feeling down, losing interest in things you normally enjoy please talk with your healthcare provider.  Gun Safety- If you keep a gun in your home, keep it unloaded and with the safety lock on.  Bullets should be stored separately.  Helmet use- Always wear a helmet when riding a motorcycle, bicycle, rollerblading or skateboarding.  Safe sex- If you may be exposed to a sexually transmitted infection, use a condom  Seat belts- Seat bels can save your life; always wear one.  Smoke/Carbon Monoxide detectors- These detectors need to be installed on the appropriate level of your home.  Replace batteries at least once a year.  Skin Cancer- When out in the sun, cover up and use sunscreen SPF 15 or higher.  Violence- If anyone is threatening or hurting you, please tell your healthcare provider.    IF you received an x-ray today, you will receive an invoice from [facility]. Please contact [facility] at [phone] with questions or concerns regarding your invoice.   IF you received labwork today, you will receive an invoice from [facility]. Please contact [facility] at [phone] with questions or concerns regarding your invoice.   Our billing staff will not be able to assist you with questions regarding bills from these companies.  You will be contacted with the lab results as soon as they are available. The fastest way to get your results is to activate your My Chart account. Instructions are located on the last page of this paperwork. If you have   not heard from us regarding the results in 2 weeks, please contact this office.      

## 2016-08-31 NOTE — Progress Notes (Signed)
Commercial Driver Medical Examination   Cathlyn ParsonsByron E Hall is a 50 y.o. male who presents today for a DOT physical exam. 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 Denies dizziness, chronic headache, blurred vision, chest pain, shortness of breath, heart racing, palpitations, nausea, vomiting, abdominal pain, hematuria, lower leg swelling. Denies smoking cigarettes or drinking alcohol.   The following portions of the patient's history were reviewed and updated as appropriate: allergies, current medications, past family history, past medical history, past social history and past surgical history.  Objective:   BP 114/70 (BP Location: Right Arm, Patient Position: Sitting, Cuff Size: Normal)   Pulse 75   Temp 98.3 F (36.8 C) (Oral)   Resp 17   Ht 5' 8.5" (1.74 m)   Wt 228 lb (103.4 kg)   SpO2 97%   BMI 34.16 kg/m   Vision/hearing: Hearing Screening Comments: Peripheral Vision: Right eye 85 degrees. Left eye 85  degrees.  Patient can recognize and distinguish among traffic control signals and devices showing standard red, green, and amber colors.  Corrective lenses required: No  Monocular Vision?: No  Hearing aid requirement: No  Physical Exam  Constitutional: He is oriented to person, place, and time. He appears well-developed and well-nourished.  HENT:  TM's intact bilaterally, no effusions or erythema. Nasal turbinates pink and moist, nasal passages patent. No sinus tenderness. Oropharynx clear, mucous membranes moist, dentition in good repair.  Eyes: Conjunctivae and EOM are normal. Pupils are equal, round, and reactive to light. Right eye exhibits no discharge. Left eye exhibits no discharge. No scleral icterus.  Neck: Normal range of motion. Neck supple. No thyromegaly present.  Cardiovascular: Normal rate, regular rhythm and intact distal pulses.  Exam reveals no gallop and no friction rub.   No murmur  heard. Pulmonary/Chest: No stridor. No respiratory distress. He has no wheezes. He has no rales.  Abdominal: Soft. Bowel sounds are normal. He exhibits no distension and no mass. There is no tenderness.  Musculoskeletal: Normal range of motion. He exhibits no edema or tenderness.  Lymphadenopathy:    He has no cervical adenopathy.  Neurological: He is alert and oriented to person, place, and time. He has normal reflexes.  Skin: Skin is warm and dry. No rash noted. No erythema. No pallor.  Psychiatric: He has a normal mood and affect.    Labs: Comments: spgr:1.015,Blood:neg,Pro:neg,glu:neg   Assessment:    Healthy male exam.  Meets standards in 6849 CFR 391.41;  qualifies for 2 year certificate.    Plan:   Medical examiners certificate completed and printed. Return as needed.

## 2017-01-17 ENCOUNTER — Ambulatory Visit (INDEPENDENT_AMBULATORY_CARE_PROVIDER_SITE_OTHER): Payer: Managed Care, Other (non HMO)

## 2017-01-17 ENCOUNTER — Ambulatory Visit (INDEPENDENT_AMBULATORY_CARE_PROVIDER_SITE_OTHER): Payer: Managed Care, Other (non HMO) | Admitting: Urgent Care

## 2017-01-17 VITALS — BP 126/92 | HR 68 | Temp 97.7°F | Resp 16 | Ht 67.0 in | Wt 225.4 lb

## 2017-01-17 DIAGNOSIS — K5900 Constipation, unspecified: Secondary | ICD-10-CM | POA: Diagnosis not present

## 2017-01-17 DIAGNOSIS — R140 Abdominal distension (gaseous): Secondary | ICD-10-CM

## 2017-01-17 DIAGNOSIS — R7303 Prediabetes: Secondary | ICD-10-CM | POA: Diagnosis not present

## 2017-01-17 DIAGNOSIS — E669 Obesity, unspecified: Secondary | ICD-10-CM | POA: Diagnosis not present

## 2017-01-17 LAB — POCT GLYCOSYLATED HEMOGLOBIN (HGB A1C): Hemoglobin A1C: 5.9

## 2017-01-17 MED ORDER — OMEPRAZOLE 20 MG PO CPDR: 20.0000 mg | DELAYED_RELEASE_CAPSULE | Freq: Every day | ORAL | 3 refills | Status: DC

## 2017-01-17 MED ORDER — RANITIDINE HCL 150 MG PO TABS: 150.0000 mg | ORAL_TABLET | Freq: Two times a day (BID) | ORAL | 1 refills | Status: DC

## 2017-01-17 NOTE — Patient Instructions (Addendum)
Opciones de alimentos para pacientes con reflujo gastroesofgico - Adultos (Food Choices for Gastroesophageal Reflux Disease, Adult) Cuando se tiene reflujo gastroesofgico (ERGE), los alimentos que se ingieren y los hbitos de alimentacin son muy importantes. Elegir los alimentos adecuados puede ayudar a aliviar las molestias ocasionadas por el ERGE. QU PAUTAS GENERALES DEBO SEGUIR?  Elija las frutas, los vegetales, los cereales integrales, los productos lcteos, la carne de vaca, de pescado y de ave con bajo contenido de grasas.  Limite las grasas, como los aceites, los aderezos para ensalada, la manteca, los frutos secos y el aguacate.  Lleve un registro de las comidas para identificar los alimentos que ocasionan sntomas.  Evite los alimentos que le ocasionen reflujo. Pueden ser distintos para cada persona.  Haga comidas pequeas con frecuencia en lugar de tres comidas abundantes todos los das.  Coma lentamente, en un clima distendido.  Limite el consumo de alimentos fritos.  Cocine los alimentos utilizando mtodos que no sean la fritura.  Evite el consumo alcohol.  Evite beber grandes cantidades de lquidos con las comidas.  Evite agacharse o recostarse hasta despus de 2 o 3horas de haber comido. QU ALIMENTOS NO SE RECOMIENDAN? Los siguientes son algunos alimentos y bebidas que pueden empeorar los sntomas: Vegetales  Tomates. Jugo de tomate. Salsa de tomate y espagueti. Ajes. Cebolla y ajo. Rbano picante. Frutas  Naranjas, pomelos y limn (fruta y jugo). Carnes  Carnes de vaca, de pescado y de ave con gran contenido de grasas. Esto incluye los perros calientes, las costillas, el jamn, la salchicha, el salame y el tocino. Lcteos  Leche entera y leche chocolatada. Crema cida. Crema. Mantequilla. Helados. Queso crema. Bebidas  Caf y t negro, con o sin cafena Bebidas gaseosas o energizantes. Condimentos  Salsa picante. Salsa barbacoa. Dulces/postres   Chocolate y cacao. Rosquillas. Menta y mentol. Grasas y aceites  Alimentos con alto contenido de grasas, incluidas las papas fritas. Otros  Vinagre. Especias picantes, como la pimienta negra, la pimienta blanca, la pimienta roja, la pimienta de cayena, el curry en polvo, los clavos de olor, el jengibre y el chile en polvo. Los artculos mencionados arriba pueden no ser una lista completa de las bebidas y los alimentos que se deben evitar. Comunquese con el nutricionista para recibir ms informacin.  Esta informacin no tiene como fin reemplazar el consejo del mdico. Asegrese de hacerle al mdico cualquier pregunta que tenga. Document Released: 08/25/2005 Document Revised: 12/06/2014 Document Reviewed: 09/19/2013 Elsevier Interactive Patient Education  2017 Elsevier Inc.     IF you received an x-ray today, you will receive an invoice from [facility]. Please contact [facility] at [phone] with questions or concerns regarding your invoice.   IF you received labwork today, you will receive an invoice from [facility]. Please contact [facility] at [phone] with questions or concerns regarding your invoice.   Our billing staff will not be able to assist you with questions regarding bills from these companies.  You will be contacted with the lab results as soon as they are available. The fastest way to get your results is to activate your My Chart account. Instructions are located on the last page of this paperwork. If you have not heard from us regarding the results in 2 weeks, please contact this office.      

## 2017-01-17 NOTE — Progress Notes (Signed)
  MRN: [id number] DOB: 05/11/1966  Subjective:   Cathlyn ParsonsByron E Seoane is a 51 y.o. male presenting for chief complaint of stomach issues (bowel movement 10 mins later after eating, bloating x 3 mos)  Reports 3 month history of abdominal bloating. Has constipation, strains to have bowel movement and feels urge to defecate immediately after eating (typically has 2-3 BM). If he doesn't defecate, then he gets nauseated and gags until he has BM but denies vomiting. Denies fever, bloody stools, diarrhea, cough, sore throat, sour brash, burping, chest pain. He does not actively eat healthily, drinks 3 sodas daily. He did use one dose of an otc medication as recommended at his pharmacy, it did help his symptoms but cannot recall the name of the medication. Denies smoking cigarettes or drinking alcohol.   [name] is not currently taking any medications. Also is allergic to hydrocodone.  [name]  has a past medical history of Anxiety. Also  has a past surgical history that includes Ankle surgery.  Objective:   Vitals: BP (!) 126/92 (BP Location: Right Arm, Patient Position: Sitting, Cuff Size: Large)   Pulse 68   Temp 97.7 F (36.5 C) (Oral)   Resp 16   Ht 5\' 7"  (1.702 m)   Wt 225 lb 6.4 oz (102.2 kg)   SpO2 97%   BMI 35.30 kg/m   Physical Exam  Constitutional: He is oriented to person, place, and time. He appears well-developed and well-nourished.  HENT:  Mouth/Throat: Oropharynx is clear and moist.  Eyes: No scleral icterus.  Neck: Normal range of motion. Neck supple. No thyromegaly present.  Cardiovascular: Normal rate, regular rhythm and intact distal pulses.  Exam reveals no gallop and no friction rub.   No murmur heard. Pulmonary/Chest: No respiratory distress. He has no wheezes. He has no rales.  Abdominal: Soft. Bowel sounds are normal. He exhibits no distension and no mass. There is tenderness (epigastric, mild upper abdomen). There is no guarding.  Neurological: He is alert and oriented to  person, place, and time.  Skin: Skin is warm and dry.   Dg Abd 1 View  Result Date: 01/17/2017 CLINICAL DATA:  51 year old male with abdominal bloating and constipation EXAM: ABDOMEN - 1 VIEW COMPARISON:  Prior abdominal radiograph 01/01/2016 FINDINGS: The bowel gas pattern is normal. No radio-opaque calculi or other significant radiographic abnormality are seen. IMPRESSION: Negative. Electronically Signed   By: Malachy MoanHeath  McCullough M.D.   On: 01/17/2017 10:40   Results for orders placed or performed in visit on 01/17/17 (from the past 24 hour(s))  POCT glycosylated hemoglobin (Hb A1C)     Status: None   Collection Time: 01/17/17 10:41 AM  Result Value Ref Range   Hemoglobin A1C 5.9    Assessment and Plan :   1. Abdominal bloating 2. Constipation, unspecified constipation type - Labs pending, will manage as GERD. Counseled patient on diagnosis. Follow up with labs, plan to recheck in 4 weeks if labs are equivocal.  3. Obesity, unspecified classification, unspecified obesity type, unspecified whether serious comorbidity present 4. Pre-diabetes - Recommended dietary modifications. Recheck in 6 months.  Wallis BambergMario Terriana Barreras, PA-C [facility] [phone] 01/17/2017  10:18 AM

## 2017-01-18 LAB — COMPREHENSIVE METABOLIC PANEL
A/G RATIO: 1.4 (ref 1.2–2.2)
ALT: 40 IU/L (ref 0–44)
AST: 70 IU/L — AB (ref 0–40)
Albumin: 4.2 g/dL (ref 3.5–5.5)
Alkaline Phosphatase: 124 IU/L — ABNORMAL HIGH (ref 39–117)
BUN/Creatinine Ratio: 13 (ref 9–20)
BUN: 11 mg/dL (ref 6–24)
Bilirubin Total: 0.7 mg/dL (ref 0.0–1.2)
CALCIUM: 9.3 mg/dL (ref 8.7–10.2)
CO2: 21 mmol/L (ref 18–29)
CREATININE: 0.87 mg/dL (ref 0.76–1.27)
Chloride: 102 mmol/L (ref 96–106)
GFR, EST AFRICAN AMERICAN: 116 mL/min/{1.73_m2} (ref >59)
GFR, EST NON AFRICAN AMERICAN: 101 mL/min/{1.73_m2} (ref >59)
GLOBULIN, TOTAL: 3 g/dL (ref 1.5–4.5)
Glucose: 116 mg/dL — ABNORMAL HIGH (ref 65–99)
Potassium: 4 mmol/L (ref 3.5–5.2)
Sodium: 141 mmol/L (ref 134–144)
TOTAL PROTEIN: 7.2 g/dL (ref 6.0–8.5)

## 2017-01-18 LAB — CBC
HEMATOCRIT: 48.1 % (ref 37.5–51.0)
Hemoglobin: 16.4 g/dL (ref 13.0–17.7)
MCH: 30.2 pg (ref 26.6–33.0)
MCHC: 34.1 g/dL (ref 31.5–35.7)
MCV: 89 fL (ref 79–97)
PLATELETS: 134 10*3/uL — AB (ref 150–379)
RBC: 5.43 x10E6/uL (ref 4.14–5.80)
RDW: 14.4 % (ref 12.3–15.4)
WBC: 6.1 10*3/uL (ref 3.4–10.8)

## 2017-01-19 ENCOUNTER — Other Ambulatory Visit: Payer: Self-pay | Admitting: Urgent Care

## 2017-01-19 DIAGNOSIS — R7989 Other specified abnormal findings of blood chemistry: Secondary | ICD-10-CM

## 2017-01-19 DIAGNOSIS — R945 Abnormal results of liver function studies: Secondary | ICD-10-CM

## 2017-01-19 DIAGNOSIS — R1010 Upper abdominal pain, unspecified: Secondary | ICD-10-CM

## 2017-01-19 DIAGNOSIS — R140 Abdominal distension (gaseous): Secondary | ICD-10-CM

## 2017-01-19 LAB — H. PYLORI BREATH TEST

## 2017-01-19 LAB — H.PYLORI BREATH TEST (REFLEX): H. PYLORI BREATH TEST: NEGATIVE

## 2017-02-04 ENCOUNTER — Ambulatory Visit
Admission: RE | Admit: 2017-02-04 | Discharge: 2017-02-04 | Disposition: A | Discharge status: Home / Self Care | Payer: Managed Care, Other (non HMO) | Source: Ambulatory Visit | Attending: Urgent Care | Admitting: Urgent Care

## 2017-02-04 DIAGNOSIS — R7989 Other specified abnormal findings of blood chemistry: Secondary | ICD-10-CM

## 2017-02-04 DIAGNOSIS — R140 Abdominal distension (gaseous): Secondary | ICD-10-CM

## 2017-02-04 DIAGNOSIS — R945 Abnormal results of liver function studies: Secondary | ICD-10-CM

## 2017-02-04 DIAGNOSIS — R1010 Upper abdominal pain, unspecified: Secondary | ICD-10-CM

## 2017-02-08 ENCOUNTER — Other Ambulatory Visit: Payer: Self-pay | Admitting: Urgent Care

## 2017-02-08 DIAGNOSIS — K8020 Calculus of gallbladder without cholecystitis without obstruction: Secondary | ICD-10-CM

## 2017-02-08 DIAGNOSIS — R140 Abdominal distension (gaseous): Secondary | ICD-10-CM

## 2017-07-07 ENCOUNTER — Ambulatory Visit (INDEPENDENT_AMBULATORY_CARE_PROVIDER_SITE_OTHER): Payer: 59 | Admitting: Urgent Care

## 2017-07-07 ENCOUNTER — Encounter: Payer: Self-pay | Admitting: Urgent Care

## 2017-07-07 ENCOUNTER — Ambulatory Visit (INDEPENDENT_AMBULATORY_CARE_PROVIDER_SITE_OTHER): Payer: 59

## 2017-07-07 VITALS — BP 132/87 | HR 81 | Temp 98.1°F | Resp 18 | Ht 67.0 in | Wt 224.2 lb

## 2017-07-07 DIAGNOSIS — M25521 Pain in right elbow: Secondary | ICD-10-CM

## 2017-07-07 DIAGNOSIS — M62838 Other muscle spasm: Secondary | ICD-10-CM

## 2017-07-07 DIAGNOSIS — M25511 Pain in right shoulder: Secondary | ICD-10-CM

## 2017-07-07 DIAGNOSIS — M19011 Primary osteoarthritis, right shoulder: Secondary | ICD-10-CM | POA: Diagnosis not present

## 2017-07-07 DIAGNOSIS — R7989 Other specified abnormal findings of blood chemistry: Secondary | ICD-10-CM | POA: Diagnosis not present

## 2017-07-07 DIAGNOSIS — R945 Abnormal results of liver function studies: Secondary | ICD-10-CM

## 2017-07-07 NOTE — Patient Instructions (Addendum)
Artritis (Arthritis) El trmino artritis se usa comnmente para hacer referencia al dolor de las articulaciones o a la enfermedad articular. Hay ms de 100tipos de artritis. CAUSAS La causa ms frecuente de esta afeccin es el desgaste de una articulacin. Algunas otras causas son las siguientes:  Gota.  Inflamacin de una articulacin.  Una infeccin de una articulacin.  Esguinces y otras lesiones cerca de la articulacin.  Una reaccin farmacolgica o alrgica. En algunos casos, es posible que la causa no se conozca. SNTOMAS El sntoma principal de esta afeccin es el dolor de la articulacin con el movimiento. Otros sntomas pueden ser los siguientes:  Enrojecimiento, hinchazn o rigidez de una articulacin.  Calor que emana de la articulacin.  Fiebre.  Sensacin generalizada de estar enfermo. DIAGNSTICO Esta afeccin se puede diagnosticar mediante un examen fsico y estudios, entre ellos:  Anlisis de sangre.  Anlisis de orina.  Estudios de diagnstico por imgenes, como una resonancia magntica (RM), radiografas o una tomografa computarizada (TC). A veces, se extrae lquido de una articulacin para analizarlo. TRATAMIENTO El tratamiento de esta afeccin puede incluir lo siguiente:  El tratamiento de la causa, si se conoce.  Reposo.  Mantener elevada la articulacin.  Aplicar compresas fras o calientes en la articulacin.  Medicamentos para aliviar los sntomas y reducir la inflamacin.  Inyecciones de un corticoide, como cortisona, en la articulacin para ayudar a aliviar el dolor y reducir la inflamacin. Segn la causa de la artritis, tal vez haya que hacer cambios en el estilo de vida para reducir la carga sobre la articulacin. Algunos de los cambios incluyen realizar ms actividad fsica y bajar de peso, entre otros. INSTRUCCIONES PARA EL CUIDADO EN EL HOGAR Medicamentos  Tome los medicamentos de venta libre y los recetados solamente como se lo  haya indicado el mdico.  No tome aspirina para aliviar el dolor si se sospecha la presencia de gota. Actividades  Ponga en reposo la articulacin como se lo haya indicado el mdico. El reposo es importante cuando la enfermedad est activa y la articulacin le duele, est hinchada o rgida.  Evite las actividades que intensifiquen el dolor. Es importante encontrar el equilibrio entre la actividad y el reposo.  Con frecuencia, realice ejercicios de flexibilidad articular como se lo haya indicado el mdico. Intente realizar ejercicios de bajo impacto, por ejemplo: ? Natacin. ? Gimnasia acutica. ? Andar en bicicleta. ? Caminar. Cuidado de la articulacin  Si la articulacin se le hincha, mantngala elevada como se lo haya indicado el mdico.  Si al despertar por la maana, nota que la articulacin est rgida, intente tomar una ducha con agua tibia.  Si se lo indican, pngase calor en la articulacin. Si es diabtico, no se aplique calor sin la autorizacin del mdico. ? Coloque una toalla entre la articulacin y la compresa caliente o la almohadilla trmica. ? Coloque el calor en la zona durante 20 o 30minutos.  Si se lo indican, pngase hielo en la articulacin: ? Ponga el hielo en una bolsa plstica. ? Coloque una toalla entre la piel y la bolsa de hielo. ? Coloque el hielo durante 20minutos, 2 a 3veces por da.  Concurra a todas las visitas de control como se lo haya indicado el mdico. Esto es importante. SOLICITE ATENCIN MDICA SI:  El dolor empeora.  Tiene fiebre. SOLICITE ATENCIN MDICA DE INMEDIATO SI:  Siente dolor, u observa hinchazn o enrojecimiento en la articulacin.  Siente dolor en muchas articulaciones y se le hinchan.  Siente un dolor   intenso en la espalda.  Tiene mucha debilidad en la pierna.  No puede controlar los intestinos o la vejiga. Esta informacin no tiene Theme park managercomo fin reemplazar el consejo del mdico. Asegrese de hacerle al mdico cualquier  pregunta que tenga. Document Released: 11/15/2005 Document Revised: 03/08/2016 Document Reviewed: 02/10/2015 Elsevier Interactive Patient Education  2018 [employer]vier [employer]     Dolor en el hombro (Shoulder Pain) Muchas cosas pueden provocar dolor en el hombro, por ejemplo:  Una lesin en la zona.  El uso excesivo del hombro.  Artritis. La causa del dolor puede ser lo siguiente:  Inflamacin.  Una lesin en la articulacin del hombro.  Una lesin en un tendn, ligamento o hueso. INSTRUCCIONES PARA EL CUIDADO EN EL HOGAR Tome estas medidas para aliviar el dolor:  Apriete una pelota blanda o una almohadilla de goma tanto como sea posible. Esto ayuda e prevenir la hinchazn en el hombro. Tambin ayuda a Medical sales representativefortalecer el brazo.  Tome los medicamentos de venta libre y los recetados solamente como se lo haya indicado el mdico.  Si se lo indican, aplique hielo sobre la zona: ? Nature conservation officeronga el hielo en una bolsa plstica. ? Coloque una FirstEnergy Corptoalla entre la piel y la bolsa de hielo. ? Coloque el hielo durante 20minutos, 2 a 3veces por da. Deje de aplicar hielo si no ayuda a Engineer, materialsaliviar el dolor.  Si le indicaron que use un cabestrillo o un inmovilizador en el hombro: ? selos como se lo hayan indicado. ? Quteselos para ducharse o para baarse. ? Mueva el brazo lo menos posible, pero mantenga la mano en movimiento para evitar la hinchazn. SOLICITE ATENCIN MDICA SI:  El dolor empeora.  El dolor no se alivia con los United Parcelmedicamentos.  Aparece un dolor nuevo en el brazo, la mano o los dedos.  SOLICITE ATENCIN MDICA DE INMEDIATO SI:  El brazo, la mano o los dedos: ? Hormiguean. ? Se adormecen. ? Se hinchan. ? Duelen. ? Se tornan de color blanco o azul.  Esta informacin no tiene Theme park managercomo fin reemplazar el consejo del mdico. Asegrese de hacerle al mdico cualquier pregunta que tenga. Document Released: 08/25/2005 Document Revised: 03/08/2016 Document Reviewed: 03/10/2015 Elsevier Interactive  Patient Education  2017 [employer]vier Inc.    IF you received an x-ray today, you will receive an [facility]. Please contact [facility] at [phone] with questions or concerns regarding your invoice.   IF you received labwork today, you will receive an invoice from [facility]. Please contact [facility] at [phone] with questions or concerns regarding your invoice.   Our billing staff will not be able to assist you with questions regarding bills from these companies.  You will be contacted with the lab results as soon as they are available. The fastest way to get your results is to activate your My Chart account. Instructions are located on the last page of this paperwork. If you have not heard from us regarding the results in 2 weeks, please contact this office.

## 2017-07-07 NOTE — Progress Notes (Signed)
  MRN: [id number] DOB: 07/27/1966  Subjective:   Ian Hall is a 51 y.o. male presenting for chief complaint of Shoulder Pain (right shoulder started last night after work) and Arm Pain (right arm pain)   Reports 1 day history of right shoulder and arm pain. Pain is constant, sharp, has associated stiffness of his fingers. He went to work today, was doing heavy lifting, ~50lbs consistently today, this worsened his right arm pain. A nurse saw patient at work and was advised to come for an evaluation for tendinitis. His work is very strenuous, works as a Education administratorpainter, does a lot of heavy lifting. Has not tried any medications for relief. Denies fever, redness, bony deformity, trauma, history of shoulder issues, history of arthritis.   Ian Hall is not currently taking any medications. Also is allergic to hydrocodone.  Ian Hall  has a past medical history of Anxiety. Also  has a past surgical history that includes Ankle surgery.  Objective:   Vitals: BP 132/87   Pulse 81   Temp 98.1 F (36.7 C) (Oral)   Resp 18   Ht 5\' 7"  (1.702 m)   Wt 224 lb 3.2 oz (101.7 kg)   SpO2 96%   BMI 35.11 kg/m   Physical Exam  Constitutional: He is oriented to person, place, and time. He appears well-developed and well-nourished.  Cardiovascular: Normal rate.   Pulmonary/Chest: Effort normal.  Musculoskeletal:       Right shoulder: He exhibits decreased range of motion (external rotation), tenderness (throughout ROM testing, positive Hawkins and Neers test) and bony tenderness (over AC joint, deltoids). He exhibits no swelling, no effusion, no crepitus, no deformity, no spasm and normal strength.       Right elbow: He exhibits normal range of motion, no swelling, no effusion, no deformity and no laceration. Tenderness found. Lateral epicondyle tenderness noted. No radial head, no medial epicondyle and no olecranon process tenderness noted.  Neurological: He is alert and oriented to person, place, and time.   Dg  Shoulder Right  Result Date: 07/07/2017 CLINICAL DATA:  Acute right shoulder pain without known injury. EXAM: RIGHT SHOULDER - 2+ VIEW COMPARISON:  02/26/2013 FINDINGS: No acute fracture or dislocation is identified. Mild degenerative spurring is again noted at the acromioclavicular joint. No significant soft tissue abnormality is seen. IMPRESSION: Mild AC joint osteoarthrosis without evidence of acute osseous abnormality. Electronically Signed   By: Sebastian AcheAllen  Grady M.D.   On: 07/07/2017 17:34   Assessment and Plan :   1. Pain in joint of right shoulder 2. Right elbow pain 3. Muscle spasm 4. Osteoarthritis of right shoulder, unspecified osteoarthritis type 5. Elevated LFTs - Work restrictions provided. I am okay with filling out short term disability for patient. He has a history of elevated LFTs, he did not do the U/S afterall. Labs pending, will prescribe NSAIDs as appropriate. Patient will otherwise, f/u in 2 weeks.  Wallis BambergMario Indira Sorenson, PA-C [facility] [phone] 07/07/2017  4:57 PM

## 2017-07-08 LAB — COMPREHENSIVE METABOLIC PANEL
A/G RATIO: 1.7 (ref 1.2–2.2)
ALBUMIN: 4.6 g/dL (ref 3.5–5.5)
ALT: 31 IU/L (ref 0–44)
AST: 56 IU/L — ABNORMAL HIGH (ref 0–40)
Alkaline Phosphatase: 123 IU/L — ABNORMAL HIGH (ref 39–117)
BILIRUBIN TOTAL: 1.1 mg/dL (ref 0.0–1.2)
BUN / CREAT RATIO: 16 (ref 9–20)
BUN: 13 mg/dL (ref 6–24)
CALCIUM: 9.7 mg/dL (ref 8.7–10.2)
CHLORIDE: 105 mmol/L (ref 96–106)
CO2: 23 mmol/L (ref 20–29)
Creatinine, Ser: 0.83 mg/dL (ref 0.76–1.27)
GFR, EST AFRICAN AMERICAN: 119 mL/min/{1.73_m2} (ref >59)
GFR, EST NON AFRICAN AMERICAN: 103 mL/min/{1.73_m2} (ref >59)
GLOBULIN, TOTAL: 2.7 g/dL (ref 1.5–4.5)
Glucose: 112 mg/dL — ABNORMAL HIGH (ref 65–99)
POTASSIUM: 4.1 mmol/L (ref 3.5–5.2)
SODIUM: 141 mmol/L (ref 134–144)
TOTAL PROTEIN: 7.3 g/dL (ref 6.0–8.5)

## 2017-07-09 ENCOUNTER — Other Ambulatory Visit: Payer: Self-pay | Admitting: Urgent Care

## 2017-07-09 MED ORDER — CELECOXIB 100 MG PO CAPS: 100.0000 mg | ORAL_CAPSULE | Freq: Two times a day (BID) | ORAL | 0 refills | Status: DC

## 2017-07-22 ENCOUNTER — Encounter: Payer: Self-pay | Admitting: Urgent Care

## 2017-07-22 ENCOUNTER — Ambulatory Visit (INDEPENDENT_AMBULATORY_CARE_PROVIDER_SITE_OTHER): Payer: 59 | Admitting: Urgent Care

## 2017-07-22 VITALS — BP 139/88 | HR 75 | Temp 97.9°F | Resp 18 | Ht 67.0 in | Wt 230.8 lb

## 2017-07-22 DIAGNOSIS — R945 Abnormal results of liver function studies: Secondary | ICD-10-CM

## 2017-07-22 DIAGNOSIS — M25511 Pain in right shoulder: Secondary | ICD-10-CM | POA: Diagnosis not present

## 2017-07-22 DIAGNOSIS — R7989 Other specified abnormal findings of blood chemistry: Secondary | ICD-10-CM

## 2017-07-22 DIAGNOSIS — M19011 Primary osteoarthritis, right shoulder: Secondary | ICD-10-CM

## 2017-07-22 DIAGNOSIS — M25521 Pain in right elbow: Secondary | ICD-10-CM

## 2017-07-22 DIAGNOSIS — M7711 Lateral epicondylitis, right elbow: Secondary | ICD-10-CM | POA: Diagnosis not present

## 2017-07-22 MED ORDER — CELECOXIB 100 MG PO CAPS: 100.0000 mg | ORAL_CAPSULE | Freq: Two times a day (BID) | ORAL | 0 refills | Status: DC

## 2017-07-22 NOTE — Progress Notes (Signed)
   MRN: [id number] DOB: 1966-07-14  Subjective:   Ian Hall is a 51 y.o. male presenting for follow up on multiple joint pains. At his last OV, 07/07/2017, patient was found to have OA of his right shoulder. He also had lateral epicondylitis of right elbow. He was given script for Celebrex and provided with work restrictions. Today, he reports that his right shoulder pain has resolved. His lateral epicondylitis has also improved significantly but still feels mild pain. Denies fever, swelling, redness, warmth, trauma, weakness. He needs a form for his employer stating that he can return to work, has to be co-signed with a physician. Patient also had elevated AST, alk phos. Denies jaundice, n/v, abdominal pain. He is not drinking alcohol. Diet is not very healthy.  Broox has a current medication list which includes the following prescription(s): celecoxib. Also is allergic to hydrocodone.  Maximilian  has a past medical history of Anxiety. Also  has a past surgical history that includes Ankle surgery.  Objective:   Vitals: BP 139/88   Pulse 75   Temp 97.9 F (36.6 C) (Oral)   Resp 18   Ht _0  (1.702 m)   Wt 230 lb 12.8 oz (104.7 kg)   SpO2 97%   BMI 36.15 kg/m   Physical Exam  Constitutional: He is oriented to person, place, and time. He appears well-developed and well-nourished.  HENT:  Mouth/Throat: Oropharynx is clear and moist.  Cardiovascular: Normal rate, regular rhythm and intact distal pulses.  Exam reveals no gallop and no friction rub.   No murmur heard. Pulmonary/Chest: No respiratory distress. He has no wheezes. He has no rales.  Abdominal: Soft. Bowel sounds are normal. He exhibits no distension and no mass. There is no tenderness. There is no guarding.  No hepatomegaly.  Musculoskeletal:       Right elbow: He exhibits normal range of motion, no swelling, no effusion, no deformity and no laceration. Tenderness found. Lateral epicondyle (with resisted pronation, supination  of right hand) tenderness noted. No medial epicondyle and no olecranon process tenderness noted.  Neurological: He is alert and oriented to person, place, and time.   Assessment and Plan :   1. Osteoarthritis of right shoulder, unspecified osteoarthritis type 2. Pain in joint of right shoulder - Pain is resolved. Anticipatory guidance provided.  3. Lateral epicondylitis of right elbow 4. Right elbow pain - Will use conservative management. Patient is to wear elbow strap while at work. Use Celebrex as needed with/without APAP. Patient will try to go back to work without restrictions.  5. Elevated LFTs - Recheck in 3 months. Recommended dietary modifications, avoid alcohol use.   Jaynee Eagles, PA-C [facility] [phone] 07/22/2017 10:41 AM

## 2017-07-22 NOTE — Patient Instructions (Addendum)
Codo de [place] Scientist, product/process development) El codo de [place] (epicondilitis lateral) es la inflamacin de los tendones externos del antebrazo cerca del codo. Los tendones Automatic Data con los [place]. Los tendones externos del antebrazo intervienen en la extensin de la [place] y su punto de unin se encuentra en la parte externa del codo. A menudo, el codo de [place] aparece en aquellos que juegan al tenis, pero cualquier persona puede tener la afeccin si extiende la mueca o gira el antebrazo repetidas veces. CAUSAS La causa de la afeccin es la extensin de la Fidelity y el uso de las manos de [place] reiterada. Puede ser consecuencia de la prctica de deportes o la realizacin de tareas que exigen movimientos repetitivos del Product manager. El codo de [place] tambin puede deberse a una lesin. FACTORES DE RIESGO Corre un riesgo ms elevado de tener codo de tenista si juega al tenis o practica otro deporte con raqueta. Adems, si [place] frecuentemente las manos para trabajar. Es ms probable que Dietitian en:  Los msicos.  Los carpinteros, los pintores y los plomeros.  Los cocineros.  Los cajeros.  Los trabajadores de fbricas.  Los trabajadores de Paramedic.  Los carniceros.  Los usuarios de computadoras. SNTOMAS Los sntomas de esta afeccin incluyen lo siguiente:  Dolor y sensibilidad a la palpacin en el Product manager y la parte externa del codo. Delle Reining solo sienta dolor cuando use el brazo, o incluso cuando no lo haga.  Una sensacin de ardor que va desde el codo hasta el brazo.  Agarre dbil de las manos. DIAGNSTICO Este trastorno se puede diagnosticar mediante la historia clnica y un examen fsico. Tambin pueden hacerle otros estudios, por ejemplo:  Radiografas.  Resonancia magntica. TRATAMIENTO El [employer] recomendar que haga cambios en el estilo de vida, por ejemplo, que ponga el brazo en reposo y se aplique hielo. El tratamiento tambin puede incluir lo  siguiente:  Medicamentos para la inflamacin. Estos pueden incluir inyecciones de cortisona si el dolor contina.  Fisioterapia. Este tratamiento puede incluir masajes o ejercicios.  Un dispositivo ortopdico para el codo. En ltima instancia, se puede recomendar ciruga si el dolor no desaparece con el tratamiento. INSTRUCCIONES PARA EL CUIDADO EN EL HOGAR Actividad  Mantenga el codo y Warden/ranger en reposo como se lo haya indicado el mdico. Intente no realizar ninguna actividad que pueda haber causado el problema hasta que el mdico se lo permita.  Si un fisioterapeuta le ensea ejercicios, hgalos como se lo hayan indicado.  Si levanta un objeto, hgalo con la palma de la mano [place] arriba. Esto reduce el esfuerzo en el codo. Estilo de vida  Si el codo de [place] se debe a los deportes, revise el equipo y Manufacturing engineer de lo siguiente: ? Que lo [place] correctamente. ? Que es la opcin ms [place] para usted.  Si el codo de [place] se debe al Aleen Campi, tmese descansos con frecuencia, si es posible. Hable con el gerente sobre la mejor forma de Education officer, environmental las tareas de un modo que sea [place]. ? Si el codo de [place] se debe al uso de la computadora, hable con el gerente [employer] Station[employer] cambios que pueden hacerse en su lugar de [employer]. Instrucciones generales  Si se lo indican, aplique hielo sobre la zona dolorida: ? Nature conservation officer hielo en una bolsa plstica. ? Coloque una FirstEnergy Corp piel y la bolsa de hielo. ? Coloque el hielo durante , 2 a 3veces por da.  Tome los Coca[employer]Cola  como se lo haya indicado el mdico.  Si le indicaron que use un dispositivo ortopdico, selo como se lo haya indicado el mdico.  Concurra a todas las visitas de control como se lo haya indicado el mdico. Esto es importante. SOLICITE ATENCIN MDICA SI:  El dolor no mejora con Scientist, research (medical).  El dolor [place].  Tiene adormecimiento o debilidad en el antebrazo, la mano o los dedos de la  [place]. Esta informacin no tiene Theme park manager el consejo del mdico. Asegrese de hacerle al mdico cualquier pregunta que tenga. Document Released: 11/15/2005 Document Revised: 04/01/2015 Document Reviewed: 11/11/2014 Elsevier Interactive Patient Education  2018 [employer]vin[employer]itor.     IF you received an x-ray today, you will receive an invoice from [facility]. Please contact [facility] at [phone] with questions or concerns regarding your invoice.   IF you received labwork today, you will receive an invoice from [place]. Please contact [facility] at [phone] with questions or concerns regarding your invoice.   Our billing staff will not be able to assist you with questions regarding bills from these companies.  You will be contacted with the lab results as soon as they are available. The fastest way to get your results is to activate your My Chart account. Instructions are located on the last page of this paperwork. If you have not heard from [place] regarding the results in 2 weeks, please contact this office.

## 2017-08-09 ENCOUNTER — Other Ambulatory Visit: Payer: Self-pay | Admitting: Gerontology

## 2017-08-09 ENCOUNTER — Ambulatory Visit (INDEPENDENT_AMBULATORY_CARE_PROVIDER_SITE_OTHER): Payer: Self-pay

## 2017-08-09 DIAGNOSIS — Z0000 Encounter for general adult medical examination without abnormal findings: Secondary | ICD-10-CM

## 2017-10-21 ENCOUNTER — Ambulatory Visit: Payer: 59 | Admitting: Family Medicine

## 2017-10-27 ENCOUNTER — Ambulatory Visit: Payer: 59 | Admitting: Urgent Care

## 2017-12-26 ENCOUNTER — Ambulatory Visit (INDEPENDENT_AMBULATORY_CARE_PROVIDER_SITE_OTHER): Payer: 59 | Admitting: Physician Assistant

## 2017-12-26 ENCOUNTER — Encounter: Payer: Self-pay | Admitting: Physician Assistant

## 2017-12-26 ENCOUNTER — Other Ambulatory Visit: Payer: Self-pay

## 2017-12-26 VITALS — BP 130/88 | HR 88 | Temp 98.3°F | Resp 16 | Ht 67.0 in | Wt 226.8 lb

## 2017-12-26 DIAGNOSIS — R05 Cough: Secondary | ICD-10-CM

## 2017-12-26 DIAGNOSIS — R058 Other specified cough: Secondary | ICD-10-CM

## 2017-12-26 MED ORDER — PREDNISONE 20 MG PO TABS: ORAL_TABLET | ORAL | 0 refills | Status: AC

## 2017-12-26 MED ORDER — HYDROCODONE-HOMATROPINE 5-1.5 MG/5ML PO SYRP: 5.0000 mL | ORAL_SOLUTION | Freq: Every day | ORAL | 0 refills | Status: DC

## 2017-12-26 NOTE — Patient Instructions (Addendum)
  Itching is a common reaction to narcotics.  Is not necessarily an allergic reaction.  If this happens I would advise that she take some Benadryl 25-50 mg every 6 hours.  I think you have post viral cough syndrome and antibiotics are not necessary to treat this.  Prednisone will be very helpful in reducing the cough.   IF you received an x-ray today, you will receive an invoice from [facility]. Please contact [facility] at [phone] with questions or concerns regarding your invoice.   IF you received labwork today, you will receive an invoice from [place]. Please contact [facility] at [phone] with questions or concerns regarding your invoice.   Our billing staff will not be able to assist you with questions regarding bills from these companies.  You will be contacted with the lab results as soon as they are available. The fastest way to get your results is to activate your My Chart account. Instructions are located on the last page of this paperwork. If you have not heard from [place] regarding the results in 2 weeks, please contact this office.        IF you received an x-ray today, you will receive an invoice from [facility]. Please contact [facility] at [phone] with questions or concerns regarding your invoice.   IF you received labwork today, you will receive an invoice from [place]. Please contact [facility] at [phone] with questions or concerns regarding your invoice.   Our billing staff will not be able to assist you with questions regarding bills from these companies.  You will be contacted with the lab results as soon as they are available. The fastest way to get your results is to activate your My Chart account. Instructions are located on the last page of this paperwork. If you have not heard from [place] regarding the results in 2 weeks, please contact this office.

## 2017-12-26 NOTE — Progress Notes (Deleted)
    12/26/2017 1:56 PM   DOB: [birth year] / MRN: [id number]  SUBJECTIVE:  Ian Hall is a 52 y.o. male presenting for   He is allergic to hydrocodone.   He  has a past medical history of Anxiety.    He  reports that he has quit smoking. he has never used smokeless tobacco. He reports that he does not drink alcohol or use drugs. He  reports that he currently engages in sexual activity. He reports using the following method of birth control/protection: None. The patient  has a past surgical history that includes Ankle surgery.  His family history includes Diabetes in his father and mother; Memory loss in his mother.  ROS  The problem list and medications were reviewed and updated by myself where necessary and exist elsewhere in the encounter.   OBJECTIVE:  BP 130/88 (BP Location: Left Arm, Patient Position: Sitting, Cuff Size: Large)   Pulse 88   Temp 98.3 F (36.8 C) (Oral)   Resp 16   Ht 5' 7"  (1.702 m)   Wt 226 lb 12.8 oz (102.9 kg)   SpO2 97%   BMI 35.52 kg/m   Physical Exam  No results found for this or any previous visit (from the past 72 hour(s)).  No results found.  ASSESSMENT AND PLAN:  There are no diagnoses linked to this encounter.  The patient is advised to call or return to clinic if he does not see an improvement in symptoms, or to seek the care of the closest emergency department if he worsens with the above plan.   Philis Fendt, MHS, PA-C [facility] 12/26/2017 1:56 PM

## 2017-12-26 NOTE — Progress Notes (Signed)
    12/26/2017 1:59 PM   DOB: 17-Jan-1966 / MRN: [id number]  SUBJECTIVE:  Ian Hall is a 52 y.o. male presenting for for about 2 weeks. Symptoms preceeded by a viral URI with sore throat and nasal congestion.   Tried nyquil. Quit smoking at age 80 and started at age 19.  Was about a pack or two per week smoker during this, thus he has roughly a 7 pack year history.   No orthopnea, leg swelling.  No SOB and no DOE. Has tried Nyquil but this made him dizzy.    He is allergic to hydrocodone.   He  has a past medical history of Anxiety.    He  reports that he has quit smoking. he has never used smokeless tobacco. He reports that he does not drink alcohol or use drugs. He  reports that he currently engages in sexual activity. He reports using the following method of birth control/protection: None. The patient  has a past surgical history that includes Ankle surgery.  His family history includes Diabetes in his father and mother; Memory loss in his mother.  ROS  Per HPI  The problem list and medications were reviewed and updated by myself where necessary and exist elsewhere in the encounter.   OBJECTIVE:  BP 130/88 (BP Location: Left Arm, Patient Position: Sitting, Cuff Size: Large)   Pulse 88   Temp 98.3 F (36.8 C) (Oral)   Resp 16   Ht 5' 7"  (1.702 m)   Wt 226 lb 12.8 oz (102.9 kg)   SpO2 97%   BMI 35.52 kg/m   Lab Results  Component Value Date   HGBA1C 5.9 01/17/2017     Physical Exam  Constitutional: He appears well-developed. He is active and cooperative.  Non-toxic appearance.  Cardiovascular: Normal rate, regular rhythm, S1 normal, S2 normal, normal heart sounds, intact distal pulses and normal pulses. Exam reveals no gallop and no friction rub.  No murmur heard. Pulmonary/Chest: Effort normal. No stridor. No tachypnea. No respiratory distress. He has no wheezes. He has no rales.  Abdominal: He exhibits no distension.  Musculoskeletal: He exhibits no edema.    Neurological: He is alert.  Skin: Skin is warm and dry. He is not diaphoretic. No pallor.  Vitals reviewed.     No results found for this or any previous visit (from the past 72 hour(s)).  No results found.  ASSESSMENT AND PLAN:  Ian Hall was seen today for cough and chest congestion.  Diagnoses and all orders for this visit:  Post-viral cough syndrome: Lung exam is normal today.  Symptoms were preceded by cold.  He cannot stop coughing in the room today.   -     predniSONE (DELTASONE) 20 MG tablet; Take 3 in the morning for 3 days, then 2 in the morning for 3 days, and then 1 in the morning for 3 days. -     HYDROcodone-homatropine (HYCODAN) 5-1.5 MG/5ML syrup; Take 5 mLs by mouth at bedtime.    The patient is advised to call or return to clinic if he does not see an improvement in symptoms, or to seek the care of the closest emergency department if he worsens with the above plan.   Philis Fendt, MHS, PA-C [facility] 12/26/2017 1:59 PM

## 2018-03-10 ENCOUNTER — Encounter (HOSPITAL_COMMUNITY): Payer: Self-pay | Admitting: Family Medicine

## 2018-03-10 ENCOUNTER — Ambulatory Visit (HOSPITAL_COMMUNITY)
Admission: EM | Admit: 2018-03-10 | Discharge: 2018-03-10 | Disposition: A | Discharge status: Home / Self Care | Payer: 59 | Attending: Emergency Medicine | Admitting: Emergency Medicine

## 2018-03-10 DIAGNOSIS — J40 Bronchitis, not specified as acute or chronic: Secondary | ICD-10-CM | POA: Diagnosis not present

## 2018-03-10 DIAGNOSIS — R05 Cough: Secondary | ICD-10-CM

## 2018-03-10 MED ORDER — IBUPROFEN 600 MG PO TABS: 600.0000 mg | ORAL_TABLET | Freq: Four times a day (QID) | ORAL | 0 refills | Status: DC | PRN

## 2018-03-10 MED ORDER — IPRATROPIUM-ALBUTEROL 0.5-2.5 (3) MG/3ML IN SOLN
RESPIRATORY_TRACT | Status: AC
  Filled 2018-03-10: qty 3

## 2018-03-10 MED ORDER — CETIRIZINE HCL 10 MG PO CAPS: 10.0000 mg | ORAL_CAPSULE | Freq: Every day | ORAL | 0 refills | Status: DC

## 2018-03-10 MED ORDER — ALBUTEROL SULFATE HFA 108 (90 BASE) MCG/ACT IN AERS: 1.0000 | INHALATION_SPRAY | Freq: Four times a day (QID) | RESPIRATORY_TRACT | 0 refills | Status: DC | PRN

## 2018-03-10 MED ORDER — IPRATROPIUM-ALBUTEROL 0.5-2.5 (3) MG/3ML IN SOLN
3.0000 mL | Freq: Once | RESPIRATORY_TRACT | Status: AC
  Administered 2018-03-10: 3 mL via RESPIRATORY_TRACT

## 2018-03-10 NOTE — ED Triage Notes (Signed)
Pt here for cough since Tuesday after working with a product at work. sts a painting product and it is a powder.

## 2018-03-10 NOTE — ED Provider Notes (Signed)
[facility]    CSN: [id number] Arrival date & time: 03/10/18  1057     History   Chief Complaint Chief Complaint  Patient presents with  . Cough    HPI Ian Hall is a 52 y.o. male presenting today for evaluation of cough.  States that on Tuesday he was working with a powder at work that caused him to have an odd taste in his mouth despite wearing a mask.  Later that night he developed significant coughing that persisted into yesterday.  He notes that previously when he used this same chemical a cause very similar symptoms, and he refrain from working with it.  He also endorses some congestion.  Denies any fevers.  Did have one episode of vomiting yesterday related to coughing so much.  HPI  Past Medical History:  Diagnosis Date  . Anxiety     There are no active problems to display for this patient.   Past Surgical History:  Procedure Laterality Date  . ANKLE SURGERY         Home Medications    Prior to Admission medications   Medication Sig Start Date End Date Taking? Authorizing Provider  albuterol (PROVENTIL HFA;VENTOLIN HFA) 108 (90 Base) MCG/ACT inhaler Inhale 1-2 puffs into the lungs every 6 (six) hours as needed for wheezing or shortness of breath. 03/10/18   Wieters, Hallie C, PA-C  celecoxib (CELEBREX) 100 MG capsule Take 1 capsule (100 mg total) by mouth 2 (two) times daily. Patient not taking: Reported on 12/26/2017 07/22/17   Jaynee Eagles, PA-C  Cetirizine HCl 10 MG CAPS Take 1 capsule (10 mg total) by mouth daily for 10 days. 03/10/18 03/20/18  Wieters, Hallie C, PA-C  HYDROcodone-homatropine (HYCODAN) 5-1.5 MG/5ML syrup Take 5 mLs by mouth at bedtime. 12/26/17   Tereasa Coop, PA-C  ibuprofen (ADVIL,MOTRIN) 600 MG tablet Take 1 tablet (600 mg total) by mouth every 6 (six) hours as needed. 03/10/18   Wieters, Elesa Hacker, PA-C    Family History Family History  Problem Relation Age of Onset  . Diabetes Mother   . Memory loss Mother   . Diabetes  Father     Social History Social History   Tobacco Use  . Smoking status: Former Research scientist (life sciences)  . Smokeless tobacco: Never Used  Substance Use Topics  . Alcohol use: No  . Drug use: No     Allergies   Hydrocodone   Review of Systems Review of Systems  Constitutional: Negative for activity change, appetite change, fatigue and fever.  HENT: Positive for congestion, postnasal drip and rhinorrhea. Negative for ear pain, sinus pressure and sore throat.   Eyes: Negative for pain and itching.  Respiratory: Positive for cough. Negative for shortness of breath.   Cardiovascular: Negative for chest pain.  Gastrointestinal: Positive for vomiting. Negative for abdominal pain, diarrhea and nausea.  Musculoskeletal: Negative for myalgias.  Skin: Negative for rash.  Neurological: Negative for dizziness, light-headedness and headaches.     Physical Exam Triage Vital Signs ED Triage Vitals  Enc Vitals Group     BP 03/10/18 1207 (!) 159/97     Pulse Rate 03/10/18 1207 90     Resp 03/10/18 1207 20     Temp 03/10/18 1207 98.1 F (36.7 C)     Temp src --      SpO2 03/10/18 1207 97 %     Weight --      Height --      Head Circumference --  Peak Flow --      Pain Score 03/10/18 1206 0     Pain Loc --      Pain Edu? --      Excl. in [place]? --    No data found.  Updated Vital Signs BP (!) 159/97   Pulse 90   Temp 98.1 F (36.7 C)   Resp 20   SpO2 97%   Visual Acuity Right Eye Distance:   Left Eye Distance:   Bilateral Distance:    Right Eye Near:   Left Eye Near:    Bilateral Near:     Physical Exam  Constitutional: He appears well-developed and well-nourished.  HENT:  Head: Normocephalic and atraumatic.  Bilateral TMs nonerythematous, nasal mucosa erythematous with clear rhinorrhea present, posterior oropharynx nonerythematous no tonsillar enlargement or exudate.  Eyes: Conjunctivae are normal.  Neck: Neck supple.  Cardiovascular: Normal rate and regular rhythm.  No  murmur heard. Pulmonary/Chest: Effort normal and breath sounds normal. No respiratory distress.  Mild inspiratory wheezing in bilateral lung fields, more prominent on left lung  Abdominal: Soft. There is no tenderness.  Musculoskeletal: He exhibits no edema.  Neurological: He is alert.  Skin: Skin is warm and dry.  Psychiatric: He has a normal mood and affect.  Nursing note and vitals reviewed.    UC Treatments / Results  Labs (all labs ordered are listed, but only abnormal results are displayed) Labs Reviewed - No data to display  EKG None Radiology No results found.  Procedures Procedures (including critical care time)  Medications Ordered in UC Medications  ipratropium-albuterol (DUONEB) 0.5-2.5 (3) MG/3ML nebulizer solution 3 mL (3 mLs Nebulization Given 03/10/18 1247)     Initial Impression / Assessment and Plan / UC Course  I have reviewed the triage vital signs and the nursing notes.  Pertinent labs & imaging results that were available during my care of the patient were reviewed by me and considered in my medical decision making (see chart for details).     Patient with cough secondary to inhalation, possible bronchitis.  Will provide breathing treatment in clinic today.  Sent home with albuterol inhaler.  Also recommending to begin daily Zyrtec.  Ibuprofen for chest discomfort related to coughing. Discussed strict return precautions. Patient verbalized understanding and is agreeable with plan.   Final Clinical Impressions(s) / UC Diagnoses   Final diagnoses:  Bronchitis    ED Discharge Orders        Ordered    albuterol (PROVENTIL HFA;VENTOLIN HFA) 108 (90 Base) MCG/ACT inhaler  Every 6 hours PRN     03/10/18 1245    Cetirizine HCl 10 MG CAPS  Daily     03/10/18 1245    ibuprofen (ADVIL,MOTRIN) 600 MG tablet  Every 6 hours PRN     03/10/18 1245       Controlled Substance Prescriptions [place] Controlled Substance Registry consulted? Not Applicable     Janith Lima, [place] 03/10/18 1251

## 2018-03-10 NOTE — Discharge Instructions (Signed)
Please begin daily allergy pill like Zyrtec or Claritin, I have sent this in but you may also get over-the-counter and use store brand if it is cheaper.  Please use albuterol inhaler as needed for shortness of breath, wheezing and coughing spells.  Please use ibuprofen and Tylenol to help with your chest discomfort with coughing.

## 2018-03-22 ENCOUNTER — Encounter: Payer: Self-pay | Admitting: Emergency Medicine

## 2018-03-22 ENCOUNTER — Other Ambulatory Visit: Payer: Self-pay

## 2018-03-22 ENCOUNTER — Ambulatory Visit: Payer: Self-pay

## 2018-03-22 ENCOUNTER — Ambulatory Visit (INDEPENDENT_AMBULATORY_CARE_PROVIDER_SITE_OTHER): Payer: 59 | Admitting: Emergency Medicine

## 2018-03-22 ENCOUNTER — Other Ambulatory Visit: Payer: Self-pay | Admitting: Family Medicine

## 2018-03-22 ENCOUNTER — Telehealth: Payer: Self-pay | Admitting: *Deleted

## 2018-03-22 VITALS — BP 128/88 | HR 88 | Temp 98.3°F | Resp 16 | Ht 67.0 in | Wt 228.6 lb

## 2018-03-22 DIAGNOSIS — B078 Other viral warts: Secondary | ICD-10-CM | POA: Diagnosis not present

## 2018-03-22 DIAGNOSIS — R053 Chronic cough: Secondary | ICD-10-CM

## 2018-03-22 DIAGNOSIS — R05 Cough: Secondary | ICD-10-CM

## 2018-03-22 HISTORY — DX: Other viral warts: B07.8

## 2018-03-22 NOTE — Telephone Encounter (Signed)
Entered in error

## 2018-03-22 NOTE — Patient Instructions (Addendum)
     IF you received an x-ray today, you will receive an invoice from [facility]. Please contact [facility] at [phone] with questions or concerns regarding your invoice.   IF you received labwork today, you will receive an invoice from [facility]. Please contact [facility] at [phone] with questions or concerns regarding your invoice.   Our billing staff will not be able to assist you with questions regarding bills from these companies.  You will be contacted with the lab results as soon as they are available. The fastest way to get your results is to activate your My Chart account. Instructions are located on the last page of this paperwork. If you have not heard from us regarding the results in 2 weeks, please contact this office.     Warts Warts are small growths on the skin. They are common, and they are caused by a type of germ (virus). Warts can occur on many areas of the body. A person may have one wart or more than one wart. Warts can spread if you scratch a wart and then scratch normal skin. Most warts will go away over many months to a couple years. Treatments may be done if needed. Follow these instructions at home:  Apply over-the-counter and prescription medicines only as told by your doctor.  Do not apply over-the-counter wart medicines to your face or genitals before you ask your doctor if it is okay to do that.  Do not scratch or pick at a wart.  Wash your hands after you touch a wart.  Avoid shaving hair that is over a wart.  Keep all follow-up visits as told by your doctor. This is important. Contact a doctor if:  Your warts do not improve after treatment.  You have redness, swelling, or pain at the site of a wart.  You have bleeding from a wart, and the bleeding does not stop when you put light pressure on the wart.  You have diabetes and you get a wart. This information is not intended to replace advice given to you by your health care  provider. Make sure you discuss any questions you have with your health care provider. Document Released: 03/18/2011 Document Revised: 04/22/2016 Document Reviewed: 02/10/2015 Elsevier Interactive Patient Education  [employer]2018 Elsevier Inc.

## 2018-03-22 NOTE — Progress Notes (Signed)
Ian Hall 52 y.o.   Chief Complaint  Patient presents with  . skin tags    for years RIGHT underarm and LEFT thumb    HISTORY OF PRESENT ILLNESS: This is a 52 y.o. male complaining of recurrent warts to axillary area.  HPI   Prior to Admission medications   Medication Sig Start Date End Date Taking? Authorizing Provider  ibuprofen (ADVIL,MOTRIN) 600 MG tablet Take 1 tablet (600 mg total) by mouth every 6 (six) hours as needed. 03/10/18  Yes Wieters, Hallie C, PA-C  OVER THE COUNTER MEDICATION daily.   Yes [provider]  albuterol (PROVENTIL HFA;VENTOLIN HFA) 108 (90 Base) MCG/ACT inhaler Inhale 1-2 puffs into the lungs every 6 (six) hours as needed for wheezing or shortness of breath. Patient not taking: Reported on 03/22/2018 03/10/18   Wieters, Fran Lowes C, PA-C  celecoxib (CELEBREX) 100 MG capsule Take 1 capsule (100 mg total) by mouth 2 (two) times daily. Patient not taking: Reported on 12/26/2017 07/22/17   Wallis Bamberg, PA-C  Cetirizine HCl 10 MG CAPS Take 1 capsule (10 mg total) by mouth daily for 10 days. 03/10/18 03/20/18  Wieters, Hallie C, PA-C    Allergies  Allergen Reactions  . Hydrocodone Itching    Noted with hydrocodone cough syrup. Itching only, no hives.  02/03/16: denied itching with recent hydrocodone cough syrup. Only one episode of itching previously.     There are no active problems to display for this patient.   Past Medical History:  Diagnosis Date  . Anxiety     Past Surgical History:  Procedure Laterality Date  . ANKLE SURGERY      Social History   Socioeconomic History  . Marital status: Married    Spouse name: Not on file  . Number of children: Not on file  . Years of education: Not on file  . Highest education level: Not on file  Occupational History  . Occupation: Chief of Staff: [employer]  Social Needs  . Financial resource strain: Not on file  . Food insecurity:    Worry: Not on file    Inability:  Not on file  . Transportation needs:    Medical: Not on file    Non-medical: Not on file  Tobacco Use  . Smoking status: Former Games developer  . Smokeless tobacco: Never Used  Substance and Sexual Activity  . Alcohol use: No  . Drug use: No  . Sexual activity: Yes    Birth control/protection: None  Lifestyle  . Physical activity:    Days per week: Not on file    Minutes per session: Not on file  . Stress: Not on file  Relationships  . Social connections:    Talks on phone: Not on file    Gets together: Not on file    Attends religious service: Not on file    Active member of club or organization: Not on file    Attends meetings of clubs or organizations: Not on file    Relationship status: Not on file  . Intimate partner violence:    Fear of current or ex partner: Not on file    Emotionally abused: Not on file    Physically abused: Not on file    Forced sexual activity: Not on file  Other Topics Concern  . Not on file  Social History Narrative  . Not on file    Family History  Problem Relation Age of Onset  . Diabetes Mother   .  Memory loss Mother   . Diabetes Father      Review of Systems  Constitutional: Negative.  Negative for chills, fever and weight loss.  HENT: Negative for sore throat.   Respiratory: Negative for shortness of breath.   Gastrointestinal: Negative for nausea and vomiting.  Skin:       Warts  Neurological: Negative for dizziness and headaches.     Vitals:   03/22/18 1741  BP: 128/88  Pulse: 88  Resp: 16  Temp: 98.3 F (36.8 C)  SpO2: 97%     Physical Exam  Constitutional: He is oriented to person, place, and time. He appears well-developed and well-nourished.  HENT:  Head: Normocephalic.  Eyes: Pupils are equal, round, and reactive to light.  Neck: Normal range of motion.  Cardiovascular: Normal rate and regular rhythm.  Pulmonary/Chest: Effort normal and breath sounds normal.  Musculoskeletal: Normal range of motion.    Neurological: He is alert and oriented to person, place, and time.  Skin: Capillary refill takes less than 2 seconds.  Multiple skin warts in the right axillary area.  See picture below.  Psychiatric: He has a normal mood and affect. His behavior is normal.  Vitals reviewed.  .    ASSESSMENT & PLAN: [name] was seen today for skin tags.  Diagnoses and all orders for this visit:  Other viral warts -     Ambulatory referral to Dermatology    Patient Instructions       IF you received an x-ray today, you will receive an invoice from [facility]. Please contact [facility] at [phone] with questions or concerns regarding your invoice.   IF you received labwork today, you will receive an invoice from [facility]. Please contact [facility] at [phone] with questions or concerns regarding your invoice.   Our billing staff will not be able to assist you with questions regarding bills from these companies.  You will be contacted with the lab results as soon as they are available. The fastest way to get your results is to activate your My Chart account. Instructions are located on the last page of this paperwork. If you have not heard from us regarding the results in 2 weeks, please contact this office.     Warts Warts are small growths on the skin. They are common, and they are caused by a type of germ (virus). Warts can occur on many areas of the body. A person may have one wart or more than one wart. Warts can spread if you scratch a wart and then scratch normal skin. Most warts will go away over many months to a couple years. Treatments may be done if needed. Follow these instructions at home:  Apply over-the-counter and prescription medicines only as told by your doctor.  Do not apply over-the-counter wart medicines to your face or genitals before you ask your doctor if it is okay to do that.  Do not scratch or pick at a wart.  Wash your hands after you  touch a wart.  Avoid shaving hair that is over a wart.  Keep all follow-up visits as told by your doctor. This is important. Contact a doctor if:  Your warts do not improve after treatment.  You have redness, swelling, or pain at the site of a wart.  You have bleeding from a wart, and the bleeding does not stop when you put light pressure on the wart.  You have diabetes and you get a wart. This information is not intended  to replace advice given to you by your health care provider. Make sure you discuss any questions you have with your health care provider. Document Released: 03/18/2011 Document Revised: 04/22/2016 Document Reviewed: 02/10/2015 Elsevier Interactive Patient Education  2018 [employer].      Edwina Barth, MD [facility]

## 2020-03-05 ENCOUNTER — Ambulatory Visit: Payer: 59 | Admitting: Adult Health Nurse Practitioner

## 2020-10-17 ENCOUNTER — Encounter: Payer: Self-pay | Admitting: Family Medicine

## 2020-10-17 ENCOUNTER — Ambulatory Visit: Payer: 59 | Admitting: Family Medicine

## 2020-10-17 ENCOUNTER — Other Ambulatory Visit: Payer: Self-pay

## 2020-10-17 VITALS — BP 142/73 | HR 84 | Temp 98.0°F | Resp 16 | Ht 67.0 in | Wt 224.3 lb

## 2020-10-17 DIAGNOSIS — R319 Hematuria, unspecified: Secondary | ICD-10-CM

## 2020-10-17 DIAGNOSIS — R252 Cramp and spasm: Secondary | ICD-10-CM

## 2020-10-17 DIAGNOSIS — K625 Hemorrhage of anus and rectum: Secondary | ICD-10-CM | POA: Diagnosis not present

## 2020-10-17 DIAGNOSIS — K911 Postgastric surgery syndromes: Secondary | ICD-10-CM | POA: Diagnosis not present

## 2020-10-17 DIAGNOSIS — Z0001 Encounter for general adult medical examination with abnormal findings: Secondary | ICD-10-CM | POA: Diagnosis not present

## 2020-10-17 LAB — POCT URINALYSIS DIP (MANUAL ENTRY)
Glucose, UA: NEGATIVE mg/dL
Ketones, POC UA: NEGATIVE mg/dL
Leukocytes, UA: NEGATIVE
Nitrite, UA: NEGATIVE
Protein Ur, POC: NEGATIVE mg/dL
Spec Grav, UA: 1.02 (ref 1.010–1.025)
Urobilinogen, UA: 1 E.U./dL
pH, UA: 6 (ref 5.0–8.0)

## 2020-10-17 NOTE — Progress Notes (Signed)
Patient ID: Ian Hall, male    DOB: 06/13/1966  Age: 54 y.o. MRN: [id number]  Chief Complaint  Patient presents with  . Hematuria    pt had exam at work which found blood in urine deneid symptoms at that time but is having some Lower Lt abdominal discomfort today     Subjective:   Patient had a routine occupational health exam done at the company he works at, [employer].  The nurse there sent him over here with a report that shows the microscopic hematuria which is asymptomatic.  In addition to this.  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  When he uses his bowels a lot he sees blood in the stool.  He had a colonoscopy about 5 or 6 years ago which I do not see a report on.  He has recently lost a few pounds since a visit to [place], but he really is just back to his baseline weight.  Current allergies, medications, problem list, past/family and social histories reviewed.  Objective:  BP (!) 142/73   Pulse 84   Temp 98 F (36.7 C) (Temporal)   Resp 16   Ht 5' 7"  (1.702 m)   Wt 224 lb 4.8 oz (101.7 kg)   SpO2 98%   BMI 35.13 kg/m   No other major problems.  No CVA tenderness.  Abdomen soft that mass or significant tenderness.  Minimal tenderness over the left lower quadrant.  Rectal exam not done today because a specialist will have to do it anyhow.  Assessment & Plan:   Assessment: 1. Hematuria, unspecified type   2. Dumping syndrome   3. Rectal bleeding   4. Leg cramps       Plan: He needs to be seen both by urology and gastroenterology for further evaluations.  See instructions.  Orders Placed This Encounter  Procedures  . CBC  . CMP14+EGFR  . PSA  . CK  . Ambulatory referral to Gastroenterology    Referral Priority:   Routine    Referral Type:   Consultation    Referral Reason:   Specialty Services Required    Referred to Provider:   Carol Ada, MD    Number of Visits Requested:   1  . Ambulatory  referral to Urology    Referral Priority:   Routine    Referral Type:   Consultation    Referral Reason:   Specialty Services Required    Referred to Provider:   Bjorn Loser, MD    Requested Specialty:   Urology    Number of Visits Requested:   1  . POCT urinalysis dipstick    No orders of the defined types were placed in this encounter.        Patient Instructions    I am not giving you any new treatment today until we see the results of your labs and what the specialist say.  I am referring you to gastroenterology for evaluation of your dumping syndrome and rectal bleeding.  Make sure that you tell the specialist that you were in [place], but that you had had the symptoms for a long time before then.  I am referring you to urology for the microscopic blood in the urine for evaluation of that. If you develop flank pain or fever or pain from the abdomen down into the testicles come in sooner or go to an emergency room or urgent care if necessary.  However I think this is just going to require an evaluation by the specialist.  Continue to drink lots of water to flush her system out well.  I will not do anything for the leg cramps until you see the results of the labs  If you do not hear from these referrals please call back.  Return as needed   If you have lab work done today you will be contacted with your lab results within the next 2 weeks.  If you have not heard from [place] then please contact us. The fastest way to get your results is to register for My Chart.   IF you received an x-ray today, you will receive an invoice from [facility]. Please contact [facility] at [phone] with questions or concerns regarding your invoice.   IF you received labwork today, you will receive an invoice from [facility]. Please contact [facility] at [phone] with questions or concerns regarding your invoice.   Our billing staff will not be able to assist you  with questions regarding bills from these companies.  You will be contacted with the lab results as soon as they are available. The fastest way to get your results is to activate your My Chart account. Instructions are located on the last page of this paperwork. If you have not heard from [place] regarding the results in 2 weeks, please contact this office.        Return if symptoms worsen or fail to improve.   Ruben Reason, MD 10/17/2020

## 2020-10-17 NOTE — Patient Instructions (Addendum)
  I am not giving you any new treatment today until we see the results of your labs and what the specialist say.  I am referring you to gastroenterology for evaluation of your dumping syndrome and rectal bleeding.  Make sure that you tell the specialist that you were in [place], but that you had had the symptoms for a long time before then.  I am referring you to urology for the microscopic blood in the urine for evaluation of that. If you develop flank pain or fever or pain from the abdomen down into the testicles come in sooner or go to an emergency room or urgent care if necessary.  However I think this is just going to require an evaluation by the specialist.  Continue to drink lots of water to flush her system out well.  I will not do anything for the leg cramps until you see the results of the labs  If you do not hear from these referrals please call back.  Return as needed   If you have lab work done today you will be contacted with your lab results within the next 2 weeks.  If you have not heard from [place] then please contact us. The fastest way to get your results is to register for My Chart.   IF you received an x-ray today, you will receive an invoice from [facility]. Please contact [facility] at [phone] with questions or concerns regarding your invoice.   IF you received labwork today, you will receive an invoice from [facility]. Please contact [facility] at [phone] with questions or concerns regarding your invoice.   Our billing staff will not be able to assist you with questions regarding bills from these companies.  You will be contacted with the lab results as soon as they are available. The fastest way to get your results is to activate your My Chart account. Instructions are located on the last page of this paperwork. If you have not heard from [place] regarding the results in 2 weeks, please contact this office.

## 2020-10-18 LAB — CMP14+EGFR
ALT: 34 IU/L (ref 0–44)
AST: 108 IU/L — ABNORMAL HIGH (ref 0–40)
Albumin/Globulin Ratio: 1.2 (ref 1.2–2.2)
Albumin: 3.2 g/dL — ABNORMAL LOW (ref 3.8–4.9)
Alkaline Phosphatase: 275 IU/L — ABNORMAL HIGH (ref 44–121)
BUN/Creatinine Ratio: 11 (ref 9–20)
BUN: 8 mg/dL (ref 6–24)
Bilirubin Total: 5.1 mg/dL — ABNORMAL HIGH (ref 0.0–1.2)
CO2: 22 mmol/L (ref 20–29)
Calcium: 8.3 mg/dL — ABNORMAL LOW (ref 8.7–10.2)
Chloride: 106 mmol/L (ref 96–106)
Creatinine, Ser: 0.7 mg/dL — ABNORMAL LOW (ref 0.76–1.27)
GFR calc Af Amer: 124 mL/min/{1.73_m2} (ref >59)
GFR calc non Af Amer: 107 mL/min/{1.73_m2} (ref >59)
Globulin, Total: 2.6 g/dL (ref 1.5–4.5)
Glucose: 143 mg/dL — ABNORMAL HIGH (ref 65–99)
Potassium: 3.6 mmol/L (ref 3.5–5.2)
Sodium: 138 mmol/L (ref 134–144)
Total Protein: 5.8 g/dL — ABNORMAL LOW (ref 6.0–8.5)

## 2020-10-18 LAB — CBC
Hematocrit: 38.4 % (ref 37.5–51.0)
Hemoglobin: 13.4 g/dL (ref 13.0–17.7)
MCH: 31.3 pg (ref 26.6–33.0)
MCHC: 34.9 g/dL (ref 31.5–35.7)
MCV: 90 fL (ref 79–97)
Platelets: 59 10*3/uL — CL (ref 150–450)
RBC: 4.28 x10E6/uL (ref 4.14–5.80)
RDW: 14.3 % (ref 11.6–15.4)
WBC: 5.5 10*3/uL (ref 3.4–10.8)

## 2020-10-18 LAB — CK: Total CK: 442 U/L — ABNORMAL HIGH (ref 41–331)

## 2020-10-18 LAB — PSA: Prostate Specific Ag, Serum: 0.7 ng/mL (ref 0.0–4.0)

## 2020-10-20 ENCOUNTER — Other Ambulatory Visit: Payer: Self-pay | Admitting: Registered Nurse

## 2020-10-20 DIAGNOSIS — D696 Thrombocytopenia, unspecified: Secondary | ICD-10-CM

## 2020-10-20 DIAGNOSIS — R748 Abnormal levels of other serum enzymes: Secondary | ICD-10-CM

## 2020-10-21 ENCOUNTER — Telehealth: Payer: Self-pay | Admitting: Hematology and Oncology

## 2020-10-21 NOTE — Telephone Encounter (Signed)
Scheduled appointment per 11/22 new patient referral. Spoke to patient who is aware of appointment date and time.

## 2020-10-27 ENCOUNTER — Telehealth: Payer: Self-pay | Admitting: Hematology and Oncology

## 2020-10-27 NOTE — Telephone Encounter (Signed)
Mr. Abalos has been rescheduled to see Dr. Mick Sell on 12/1 at 1pm.

## 2020-10-28 NOTE — Progress Notes (Signed)
[place] Telephone:([phone]   Fax:([phone]) [place] NOTE  Patient Care Team: Maximiano Coss, NP as PCP - General (Adult Health Nurse Practitioner)  Hematological/Oncological History # Thrombocytopenia # Elevated Alkaline Phosphatase 1) 01/17/2017: WBC 6.1, Hgb 16.4, MCV 89, Plt 134 2) 10/17/2020: WBC 5.5, Hgb 13.4, MCV 90, Plt 59 3) 10/29/2020: establish care with Dr. Lorenso Courier   CHIEF COMPLAINTS/PURPOSE OF CONSULTATION:  "Thrombocytopenia/Elevated Alkaline Phosphatase "  HISTORY OF PRESENTING ILLNESS:  Ian Hall 54 y.o. male with medical history significant for hepatitic steatosis (noted in 2018) who presents for evaluation of thrombocytopenia and elevated alkaline phosphatase.   On review of the previous records Ian Hall noted to have a mild thrombocytopenia of 134 on 01/17/2017.  There are no records in the interim however on 10/17/2020 the patient had a repeat CBC drawn which showed white blood cell count 5.5, hemoglobin 13.4, and platelet count of 59.  Additionally he had concerning findings were showed a creatinine of 0.7, albumin of 1.2, bilirubin of 5.1, and protein of 5.8.  His alkaline phosphatase was noted to be elevated at 275 with an AST of 108 and ALT of 34.  Due to concern for the patient's thrombocytopenia and alkaline phosphatase he was referred to hematology for further evaluation management.  On exam today Ian Hall reports that he is concerned about occasional blood that he sees in the urine as well as his need to use the bathroom immediately after eating.  He notes that this is of concern to him as well he is at work if he eats lunch he has to immediately use the restroom.  He reports that this happens approximately 3-4 times per day whenever he eats.  He is embarrassed about this family because of this.  He reports that the bowel movements are not watery or loose and are typically normal bowel movements with no dark stools.  He notes  that in days where he goes to the bathroom a lot he can occasionally see some blood.  He reports that he began seeing blood in the urine several months ago that is typically a light reddish color.  He reports that he is otherwise quite healthy but he does not routinely go to the physician for checkups.  On further discussion he reports his family history is remarkable for diabetes in his mother and father and he does not have any siblings or children.  He is married.  He reports he was a smoker and smoked from age 76 until 2006.  He smoked approximately 1 pack/week at that time.  He notes that he quit alcohol approximately 8 years ago, though recently he started back drinking about a 12 pack of beer at a time every 2 to 3 weeks.  He has been getting into tequila but noted that it was making him sick.  He reports he has no history of liver disease.  Otherwise he currently denies any fevers, chills, sweats, nausea, vomiting, or diarrhea.  A full 10 point ROS is listed below.  MEDICAL HISTORY:  Past Medical History:  Diagnosis Date  . Anxiety     SURGICAL HISTORY: Past Surgical History:  Procedure Laterality Date  . ANKLE SURGERY      SOCIAL HISTORY: Social History   Socioeconomic History  . Marital status: Married    Spouse name: Not on file  . Number of children: Not on file  . Years of education: Not on file  . Highest education level: Not on  file  Occupational History  . Occupation: IT sales professional: [employer]  Tobacco Use  . Smoking status: Former Research scientist (life sciences)  . Smokeless tobacco: Never Used  Substance and Sexual Activity  . Alcohol use: No  . Drug use: No  . Sexual activity: Yes    Birth control/protection: None  Other Topics Concern  . Not on file  Social History Narrative  . Not on file   Social Determinants of Health   Financial Resource Strain:   . Difficulty of Paying Living Expenses: Not on file  Food Insecurity:   . Worried About Sales executive in the Last Year: Not on file  . Ran Out of Food in the Last Year: Not on file  Transportation Needs:   . Lack of Transportation (Medical): Not on file  . Lack of Transportation (Non-Medical): Not on file  Physical Activity:   . Days of Exercise per Week: Not on file  . Minutes of Exercise per Session: Not on file  Stress:   . Feeling of Stress : Not on file  Social Connections:   . Frequency of Communication with Friends and Family: Not on file  . Frequency of Social Gatherings with Friends and Family: Not on file  . Attends Religious Services: Not on file  . Active Member of Clubs or Organizations: Not on file  . Attends Archivist Meetings: Not on file  . Marital Status: Not on file  Intimate Partner Violence:   . Fear of Current or Ex-Partner: Not on file  . Emotionally Abused: Not on file  . Physically Abused: Not on file  . Sexually Abused: Not on file    FAMILY HISTORY: Family History  Problem Relation Age of Onset  . Diabetes Mother   . Memory loss Mother   . Diabetes Father     ALLERGIES:  is allergic to hydrocodone.  MEDICATIONS:  Current Outpatient Medications  Medication Sig Dispense Refill  . albuterol (PROVENTIL HFA;VENTOLIN HFA) 108 (90 Base) MCG/ACT inhaler Inhale 1-2 puffs into the lungs every 6 (six) hours as needed for wheezing or shortness of breath. (Patient not taking: Reported on 03/22/2018) 1 Inhaler 0  . celecoxib (CELEBREX) 100 MG capsule Take 1 capsule (100 mg total) by mouth 2 (two) times daily. (Patient not taking: Reported on 12/26/2017) 30 capsule 0  . Cetirizine HCl 10 MG CAPS Take 1 capsule (10 mg total) by mouth daily for 10 days. 10 capsule 0  . ibuprofen (ADVIL,MOTRIN) 600 MG tablet Take 1 tablet (600 mg total) by mouth every 6 (six) hours as needed. (Patient not taking: Reported on 10/17/2020) 30 tablet 0  . OVER THE COUNTER MEDICATION daily. (Patient not taking: Reported on 10/17/2020)     No current facility-administered  medications for this visit.    REVIEW OF SYSTEMS:   Constitutional: ( - ) fevers, ( - )  chills , ( - ) night sweats Eyes: ( - ) blurriness of vision, ( - ) double vision, ( - ) watery eyes Ears, nose, mouth, throat, and face: ( - ) mucositis, ( - ) sore throat Respiratory: ( - ) cough, ( - ) dyspnea, ( - ) wheezes Cardiovascular: ( - ) palpitation, ( - ) chest discomfort, ( - ) lower extremity swelling Gastrointestinal:  ( - ) nausea, ( - ) heartburn, ( - ) change in bowel habits Skin: ( - ) abnormal skin rashes Lymphatics: ( - ) new lymphadenopathy, ( - ) easy bruising  Neurological: ( - ) numbness, ( - ) tingling, ( - ) new weaknesses Behavioral/Psych: ( - ) mood change, ( - ) new changes  All other systems were reviewed with the patient and are negative.  PHYSICAL EXAMINATION: ECOG PERFORMANCE STATUS: 1 - Symptomatic but completely ambulatory  Vitals:   10/29/20 1302  BP: (!) 141/79  Pulse: 86  Resp: 18  Temp: 98.2 F (36.8 C)  SpO2: 98%   Filed Weights   10/29/20 1302  Weight: 226 lb 11.2 oz (102.8 kg)    GENERAL: well appearing middle aged Hispanic male in NAD  SKIN: skin color, texture, turgor are normal, no rashes or significant lesions EYES: conjunctiva are pink and non-injected, sclera clear LUNGS: clear to auscultation and percussion with normal breathing effort HEART: regular rate & rhythm and no murmurs and no lower extremity edema ABDOMEN: soft, non-tender, non-distended, normal bowel sounds. No HSM appreciated.  Musculoskeletal: no cyanosis of digits and no clubbing  PSYCH: alert & oriented x 3, fluent speech NEURO: no focal motor/sensory deficits  LABORATORY DATA:  I have reviewed the data as listed CBC Latest Ref Rng & Units 10/17/2020 01/17/2017 01/01/2016  WBC 3.4 - 10.8 x10E3/uL 5.5 6.1 6.6  Hemoglobin 13.0 - 17.7 g/dL 13.4 16.4 17.0  Hematocrit 37.5 - 51.0 % 38.4 48.1 47.5  Platelets 150 - 450 x10E3/uL 59(LL) 134(L) -    CMP Latest Ref Rng & Units  10/17/2020 07/07/2017 01/17/2017  Glucose 65 - 99 mg/dL 143(H) 112(H) 116(H)  BUN 6 - 24 mg/dL 8 13 11   Creatinine 0.76 - 1.27 mg/dL 0.70(L) 0.83 0.87  Sodium 134 - 144 mmol/L 138 141 141  Potassium 3.5 - 5.2 mmol/L 3.6 4.1 4.0  Chloride 96 - 106 mmol/L 106 105 102  CO2 20 - 29 mmol/L 22 23 21   Calcium 8.7 - 10.2 mg/dL 8.3(L) 9.7 9.3  Total Protein 6.0 - 8.5 g/dL 5.8(L) 7.3 7.2  Total Bilirubin 0.0 - 1.2 mg/dL 5.1(H) 1.1 0.7  Alkaline Phos 44 - 121 IU/L 275(H) 123(H) 124(H)  AST 0 - 40 IU/L 108(H) 56(H) 70(H)  ALT 0 - 44 IU/L 34 31 40    RADIOGRAPHIC STUDIES: No results found.  ASSESSMENT & PLAN AGNES PROBERT 54 y.o. male with medical history significant for hepatitic steatosis (noted in 2018) who presents for evaluation of thrombocytopenia and elevated alkaline phosphatase.  After review the labs, the records, discussion with the patient the findings most consistent with worsening liver disease.  His bilirubin is markedly elevated at 5.1 during his last check and liver disease would also be consistent with his thrombocytopenia and elevated alkaline phosphatase.  As such I would recommend a referral to hepatology for further evaluation and management.  We will do some baseline labs in order to assure that there are no other causes of his thrombocytopenia including pseudothrombocytopenia.  We will review a peripheral blood film.  Nutritional deficiency could be a consideration, however we will hold on that work-up as liver disease is far more likely.  We will have the patient return on an as-needed basis if liver disease is not found to be the primary cause of his thrombocytopenia.  # Thrombocytopenia # Elevated Alkaline Phosphatase --Findings are most consistent with thrombocytopenia and elevated alkaline phosphatase due to liver disease.  This is also supported by his bilirubin of 5.1 and elevation in AST. --Recommend prompt referral to hepatology for further evaluation --Agree with  ultrasound of the liver, may also want to get the spleen within  that imaging as well. --Given his history of dumping syndrome we will check nutritional deficiency could be a consideration, but is far less likely than liver disease. Will hold on those lab for now --Hepatitis work-up with Hep B and C serologies --We will review peripheral blood film to assure that there is no evidence pseudothrombocytopenia --Return to clinic if it is apparent that there is an underlying hematological process.  Findings are most consistent with liver disease  Orders Placed This Encounter  Procedures  . CBC with Differential (Cancer Center Only)    Standing Status:   Future    Standing Expiration Date:   10/29/2021  . Platelet by Citrate    Standing Status:   Future    Standing Expiration Date:   10/29/2021  . Save Smear (SSMR)    Standing Status:   Future    Standing Expiration Date:   10/29/2021  . Lactate dehydrogenase (LDH)    Standing Status:   Future    Standing Expiration Date:   10/29/2021  . CMP (Lupus only)    Standing Status:   Future    Standing Expiration Date:   10/29/2021  . Hepatitis C antibody    Standing Status:   Future    Standing Expiration Date:   10/29/2021  . Hepatitis B surface antibody    Standing Status:   Future    Standing Expiration Date:   10/29/2021  . Hepatitis B surface antigen    Standing Status:   Future    Standing Expiration Date:   10/29/2021  . Hepatitis B core antibody, total    Standing Status:   Future    Standing Expiration Date:   10/29/2021  . Bilirubin, direct    Standing Status:   Future    Standing Expiration Date:   10/29/2021  . Immature Platelet Fraction    Standing Status:   Future    Standing Expiration Date:   10/29/2021  . Ambulatory referral to Gastroenterology    Referral Priority:   Urgent    Referral Type:   Consultation    Referral Reason:   Specialty Services Required    Number of Visits Requested:   1    All questions were  answered. The patient knows to call the clinic with any problems, questions or concerns.  A total of more than 60 minutes were spent on this encounter and over half of that time was spent on counseling and coordination of care as outlined above.   Ledell Peoples, MD Department of Hematology/Oncology [place] [facility] Phone: [phone] Pager: [phone] Email: [email or web address]  10/29/2020 1:42 PM

## 2020-10-29 ENCOUNTER — Inpatient Hospital Stay
Payer: No Typology Code available for payment source | Attending: Hematology and Oncology | Admitting: Hematology and Oncology

## 2020-10-29 ENCOUNTER — Encounter: Payer: Self-pay | Admitting: *Deleted

## 2020-10-29 ENCOUNTER — Encounter: Payer: Self-pay | Admitting: Hematology and Oncology

## 2020-10-29 ENCOUNTER — Inpatient Hospital Stay: Payer: No Typology Code available for payment source

## 2020-10-29 ENCOUNTER — Other Ambulatory Visit: Payer: Self-pay

## 2020-10-29 VITALS — BP 141/79 | HR 86 | Temp 98.2°F | Resp 18 | Ht 67.0 in | Wt 226.7 lb

## 2020-10-29 DIAGNOSIS — Z87891 Personal history of nicotine dependence: Secondary | ICD-10-CM | POA: Diagnosis not present

## 2020-10-29 DIAGNOSIS — K760 Fatty (change of) liver, not elsewhere classified: Secondary | ICD-10-CM | POA: Insufficient documentation

## 2020-10-29 DIAGNOSIS — R17 Unspecified jaundice: Secondary | ICD-10-CM | POA: Diagnosis not present

## 2020-10-29 DIAGNOSIS — D696 Thrombocytopenia, unspecified: Secondary | ICD-10-CM | POA: Diagnosis present

## 2020-10-29 DIAGNOSIS — Z791 Long term (current) use of non-steroidal anti-inflammatories (NSAID): Secondary | ICD-10-CM | POA: Insufficient documentation

## 2020-10-29 DIAGNOSIS — Z79899 Other long term (current) drug therapy: Secondary | ICD-10-CM | POA: Insufficient documentation

## 2020-10-29 DIAGNOSIS — R748 Abnormal levels of other serum enzymes: Secondary | ICD-10-CM | POA: Diagnosis not present

## 2020-10-29 LAB — CMP (CANCER CENTER ONLY)
ALT: 37 U/L (ref 0–44)
AST: 116 U/L — ABNORMAL HIGH (ref 15–41)
Albumin: 2.7 g/dL — ABNORMAL LOW (ref 3.5–5.0)
Alkaline Phosphatase: 285 U/L — ABNORMAL HIGH (ref 38–126)
Anion gap: 8 (ref 5–15)
BUN: 11 mg/dL (ref 6–20)
CO2: 21 mmol/L — ABNORMAL LOW (ref 22–32)
Calcium: 8.6 mg/dL — ABNORMAL LOW (ref 8.9–10.3)
Chloride: 109 mmol/L (ref 98–111)
Creatinine: 0.86 mg/dL (ref 0.61–1.24)
GFR, Estimated: >60 mL/min (ref >60)
Glucose, Bld: 90 mg/dL (ref 70–99)
Potassium: 3.5 mmol/L (ref 3.5–5.1)
Sodium: 138 mmol/L (ref 135–145)
Total Bilirubin: 5.4 mg/dL (ref 0.3–1.2)
Total Protein: 6.2 g/dL — ABNORMAL LOW (ref 6.5–8.1)

## 2020-10-29 LAB — CBC WITH DIFFERENTIAL (CANCER CENTER ONLY)
Abs Immature Granulocytes: 0.01 10*3/uL (ref 0.00–0.07)
Basophils Absolute: 0 10*3/uL (ref 0.0–0.1)
Basophils Relative: 1 %
Eosinophils Absolute: 0.1 10*3/uL (ref 0.0–0.5)
Eosinophils Relative: 2 %
HCT: 38 % — ABNORMAL LOW (ref 39.0–52.0)
Hemoglobin: 13.3 g/dL (ref 13.0–17.0)
Immature Granulocytes: 0 %
Lymphocytes Relative: 31 %
Lymphs Abs: 1.8 10*3/uL (ref 0.7–4.0)
MCH: 31.4 pg (ref 26.0–34.0)
MCHC: 35 g/dL (ref 30.0–36.0)
MCV: 89.8 fL (ref 80.0–100.0)
Monocytes Absolute: 0.5 10*3/uL (ref 0.1–1.0)
Monocytes Relative: 8 %
Neutro Abs: 3.4 10*3/uL (ref 1.7–7.7)
Neutrophils Relative %: 58 %
Platelet Count: 64 10*3/uL — ABNORMAL LOW (ref 150–400)
RBC: 4.23 MIL/uL (ref 4.22–5.81)
RDW: 15.3 % (ref 11.5–15.5)
WBC Count: 5.8 10*3/uL (ref 4.0–10.5)
nRBC: 0 % (ref 0.0–0.2)

## 2020-10-29 LAB — LACTATE DEHYDROGENASE: LDH: 304 U/L — ABNORMAL HIGH (ref 98–192)

## 2020-10-29 LAB — SAVE SMEAR(SSMR), FOR PROVIDER SLIDE REVIEW

## 2020-10-29 LAB — HEPATITIS C ANTIBODY: HCV Ab: NONREACTIVE

## 2020-10-29 LAB — BILIRUBIN, DIRECT: Bilirubin, Direct: 2.3 mg/dL — ABNORMAL HIGH (ref 0.0–0.2)

## 2020-10-29 LAB — HEPATITIS B CORE ANTIBODY, TOTAL: Hep B Core Total Ab: NONREACTIVE

## 2020-10-29 LAB — HEPATITIS B SURFACE ANTIGEN: Hepatitis B Surface Ag: NONREACTIVE

## 2020-10-29 LAB — PLATELET BY CITRATE

## 2020-10-29 LAB — HEPATITIS B SURFACE ANTIBODY,QUALITATIVE: Hep B S Ab: NONREACTIVE

## 2020-10-29 LAB — IMMATURE PLATELET FRACTION: Immature Platelet Fraction: 6.6 % (ref 1.2–8.6)

## 2020-11-29 HISTORY — PX: LIVER TRANSPLANT: SHX410

## 2020-12-16 ENCOUNTER — Other Ambulatory Visit (INDEPENDENT_AMBULATORY_CARE_PROVIDER_SITE_OTHER): Payer: No Typology Code available for payment source

## 2020-12-16 ENCOUNTER — Ambulatory Visit: Payer: No Typology Code available for payment source | Admitting: Gastroenterology

## 2020-12-16 ENCOUNTER — Encounter: Payer: Self-pay | Admitting: Gastroenterology

## 2020-12-16 VITALS — BP 144/84 | HR 88 | Ht 67.0 in | Wt 222.0 lb

## 2020-12-16 DIAGNOSIS — D696 Thrombocytopenia, unspecified: Secondary | ICD-10-CM

## 2020-12-16 DIAGNOSIS — R634 Abnormal weight loss: Secondary | ICD-10-CM

## 2020-12-16 DIAGNOSIS — K529 Noninfective gastroenteritis and colitis, unspecified: Secondary | ICD-10-CM

## 2020-12-16 LAB — CBC
HCT: 42.5 % (ref 39.0–52.0)
Hemoglobin: 14.5 g/dL (ref 13.0–17.0)
MCHC: 34 g/dL (ref 30.0–36.0)
MCV: 93.4 fl (ref 78.0–100.0)
Platelets: 86 10*3/uL — ABNORMAL LOW (ref 150.0–400.0)
RBC: 4.55 Mil/uL (ref 4.22–5.81)
RDW: 15.9 % — ABNORMAL HIGH (ref 11.5–15.5)
WBC: 4.5 10*3/uL (ref 4.0–10.5)

## 2020-12-16 LAB — COMPREHENSIVE METABOLIC PANEL
ALT: 65 U/L — ABNORMAL HIGH (ref 0–53)
AST: 235 U/L — ABNORMAL HIGH (ref 0–37)
Albumin: 2.9 g/dL — ABNORMAL LOW (ref 3.5–5.2)
Alkaline Phosphatase: 351 U/L — ABNORMAL HIGH (ref 39–117)
BUN: 10 mg/dL (ref 6–23)
CO2: 24 mEq/L (ref 19–32)
Calcium: 8.3 mg/dL — ABNORMAL LOW (ref 8.4–10.5)
Chloride: 108 mEq/L (ref 96–112)
Creatinine, Ser: 0.92 mg/dL (ref 0.40–1.50)
GFR: 94.5 mL/min (ref >60.00)
Glucose, Bld: 98 mg/dL (ref 70–99)
Potassium: 4.2 mEq/L (ref 3.5–5.1)
Sodium: 138 mEq/L (ref 135–145)
Total Bilirubin: 10.2 mg/dL — ABNORMAL HIGH (ref 0.2–1.2)
Total Protein: 5.8 g/dL — ABNORMAL LOW (ref 6.0–8.3)

## 2020-12-16 LAB — FERRITIN: Ferritin: 861.4 ng/mL — ABNORMAL HIGH (ref 22.0–322.0)

## 2020-12-16 LAB — PROTIME-INR
INR: 1.6 ratio — ABNORMAL HIGH (ref 0.8–1.0)
Prothrombin Time: 17.7 s — ABNORMAL HIGH (ref 9.6–13.1)

## 2020-12-16 MED ORDER — DICYCLOMINE HCL 10 MG PO CAPS: 10.0000 mg | ORAL_CAPSULE | Freq: Three times a day (TID) | ORAL | 2 refills | Status: DC

## 2020-12-16 MED ORDER — METAMUCIL 28.3 % PO POWD: 1.0000 | Freq: Two times a day (BID) | ORAL | Status: DC

## 2020-12-16 NOTE — Progress Notes (Signed)
Referring Provider: Maximiano Coss, NP Primary Care Physician:  Maximiano Coss, NP  Reason for Consultation:  Elevated bilirubin   IMPRESSION:  Progressive hyperbilirubinemia was 1.1, now 5.4 Elevated alk phos and AST, normal ALT Post-prandial diarrhea with blood in the stool Suspected underlying liver disease with elevated bilirubin and low platelets Untintentional weight loss of 40 pounds Thrombocytopenia of platelets 64,000  Laboratory findings are concerning for advanced liver disease.  History supports the possibility of alcohol related liver disease.  However, with his associated weight loss malignancy must also be excluded.  Will start with labs today to monitor his hyperbilirubinemia, elevated alkaline phosphatase, and elevated AST. Screen for associated coagulopathy. Screen for autoimmune and hemochromatosis as concurrent causes of liver disease.  Additional serologic evaluation for less common causes of liver disease may need to be pursued in the future.  Proceed with CT of the abdomen and pelvis to exclude malignancy as well as to evaluate hepatic parenchyma and biliary tree.  The patient has postprandial diarrhea with some blood in the stool and unexplained weight loss.  I have recommended treatment with Metamucil and dicyclomine for symptomatic control.  We will proceed with EGD and colonoscopy for further evaluation.  He will need close follow-up given the magnitude of hyperbilirubinemia and his weight loss.[name]   PLAN: CMP, CBC, PT/INR, ANA, IgG, ferritin Start dicyclomine 10 mg QID taken before meals Start Metamucil 1 tablespoon BID CT abd/pelvis with contrast  EGD and colonoscopy Follow-up in the office after the CT scan He was counseled to stop using all alcohol  Please see the "Patient Instructions" section for addition details about the plan.  HPI: Ian Hall is a 55 y.o. male referred by Dr. Lorenso Courier for abdominal bilirubin. The history is obtained through  the patient and review of his electronic health records.   He was recently evaluated by Dr. Lorenso Courier for thrombocytopenia and elevated alkaline phosphatase in the setting of underlying fatty liver.  The patient has no prior knowledge of underlying liver disease.  Labs from 10/29/2020 show hemoglobin 13.3, platelets 64, MCV 89.8, RDW 15.3, total bilirubin 5.4, direct bilirubin 2.3, AST 116, ALT 37, alk phos 285, albumin 2.7, total protein 6.2.  Hep C antibody negative, hep B surface antibody negative, hep B core total antibody negative.   He was referred to GI given concerns for possible underlying liver disease.  Reports years of post-prandial diarrhea. Previously associated with abdominal pain. Rare bright red blood in the stool, no mucous.  Symptoms preceded a trip to [place].  Some improvement with PeptoBismal Poor appetite. Has lost 40 pounds in the last 8 months. Has had to adjust his clothes and his belt buckle. GI ROS is otherwise negative.  He quit alcohol approximately 8 years ago though recently started drinking about a 12 pack of beer at a time.  He was also enjoying tequila but felt this made him more sick.  Multiple blood donations, last 6 months ago.  No prior blood transfusion.  No history of jaundice or scleral icterus.  No history of use or experimentation with IV or intranasal street drugs.  No history of autoimmune disease.    No family history of liver disease except for mother who had complications for diabetes including need for kidney transplant.  No known family history of colon cancer or polyps. No family history of uterine/endometrial cancer, pancreatic cancer or gastric/stomach cancer.     Past Medical History:  Diagnosis Date  . Anxiety   . COVID-19  Past Surgical History:  Procedure Laterality Date  . ANKLE SURGERY Right     Current Outpatient Medications  Medication Sig Dispense Refill  . dicyclomine (BENTYL) 10 MG capsule Take 1 capsule (10 mg total)  by mouth 4 (four) times daily -  before meals and at bedtime. 120 capsule 2  . ibuprofen (ADVIL) 200 MG tablet Take 400 mg by mouth as needed.    . Psyllium (METAMUCIL) 28.3 % POWD Take 1 Dose by mouth 2 (two) times daily.     No current facility-administered medications for this visit.    Allergies as of 12/16/2020 - Review Complete 10/29/2020  Allergen Reaction Noted  . Hydrocodone Itching 08/26/2015    Family History  Problem Relation Age of Onset  . Diabetes Mother   . Memory loss Mother   . Diabetes Father   . Diabetes Cousin     Social History   Socioeconomic History  . Marital status: Married    Spouse name: Not on file  . Number of children: 0  . Years of education: Not on file  . Highest education level: Not on file  Occupational History  . Occupation: IT sales professional: [employer]  Tobacco Use  . Smoking status: Former Smoker    Types: Cigarettes    Quit date: 12/16/2002    Years since quitting: 18.0  . Smokeless tobacco: Never Used  Vaping Use  . Vaping Use: Never used  Substance and Sexual Activity  . Alcohol use: Not Currently    Comment: none for 3-4 years (stated 12/16/2020)  . Drug use: No  . Sexual activity: Yes    Birth control/protection: None  Other Topics Concern  . Not on file  Social History Narrative  . Not on file   Social Determinants of Health   Financial Resource Strain: Not on file  Food Insecurity: Not on file  Transportation Needs: Not on file  Physical Activity: Not on file  Stress: Not on file  Social Connections: Not on file  Intimate Partner Violence: Not on file    Review of Systems: 12 system ROS is negative except as noted above.   Physical Exam: General:   Alert, in NAD. No scleral icterus. No bilateral temporal wasting.  No scleral icterus. Heart:  Regular rate and rhythm; no murmurs Pulm: Clear anteriorly; no wheezing Abdomen:  Soft. Nontender. Nondistended. Normal bowel sounds. No rebound or  guarding. No fluid wave.  No hepatosplenomegaly. LAD: No inguinal or umbilical LAD Extremities:  Without edema. Neurologic:  Alert and  oriented x4;  grossly normal neurologically; no asterixis or clonus. Skin: No jaundice. No palmar erythema or spider angioma.  No Terry's nails. Psych:  Alert and cooperative. Normal mood and affect.     Sameena Artus L. Tarri Glenn, MD, MPH 12/18/2020, 1:14 PM

## 2020-12-16 NOTE — Patient Instructions (Addendum)
LABS: Your provider has requested that you go to the basement level for lab work before leaving today. Press "B" on the elevator. The lab is located at the first door on the left as you exit the elevator.  HEALTHCARE LAWS AND MY CHART RESULTS: Due to recent changes in healthcare laws, you may see the results of your imaging and laboratory studies on MyChart before your provider has had a chance to review them.  We understand that in some cases there may be results that are confusing or concerning to you. Not all laboratory results come back in the same time frame and the provider may be waiting for multiple results in order to interpret others.  Please give [place] 48 hours in order for your provider to thoroughly review all the results before contacting the office for clarification of your results.   COLONOSCOPY AND ENDOSCOPY: You have been scheduled for an endoscopy and colonoscopy. Please follow the written instructions given to you at your visit today.  PREP: Please pick up your prep supplies at the pharmacy within the next 1-3 days.  INHALERS: If you use inhalers (even only as needed), please bring them with you on the day of your procedure.  PRESCRIPTION MEDICATION(S): We have sent the following medication(s) to your pharmacy:  . Dicyclomine - Please take 63m by mouth 4 times daily before meals  . NOTE: If your medication(s) requires a PRIOR AUTHORIZATION, we will receive notification from your pharmacy. Once received, the process to submit for approval may take up to 7-10 business days. You will be contacted about any denials we have received from your insurance company as well as what your provider is recommending.  OVER THE COUNTER MEDICATION(S): Please purchase the following medications over the counter and take as directed:  .Marland KitchenMetamucil - Please mix 1 tablespoon in 8 ounces of water and drink twice daily   CT SCAN:  [facility]20 at 8am. Please arrive @ 7:45am for registration.   PREP:   . Do not eat anything after 4am (4 hours prior to your test)  . Drink 1 bottle of contrast @ 6am (2 hours prior to your exam)  . Drink 1 bottle of contrast @ 7am (1 hour prior to your exam)   CONTRAST:  . You will need to [facility] to pick up 2 bottles of contrast 3 DAYS BEFORE YOUR EXAM. . The solution may taste better if refrigerated. . DO NOT add ice or dilute the contrast.  . Shake well before drinking.   MEDICATIONS:  . You may take your medications as prescribed with a small amount of water, if necessary.  . The following medications MAY need to be held 48 hours before your exam: METFORMIN, GLUCOPHAGE, [place] AVANDAMET, RIOMET, FORTAMET, AEnigmaMET, JANUMET, GLUMETZA or METAGLIP. Please contact [facility] at [phone] the hours of 8:00 am and 5:00 pm, Monday-Friday to inquire further.   NEED TO RESCHEDULE? Please call radiology at [phone]  WHY ARE YOU HAVING THIS EXAM?  The purpose of you drinking the oral contrast is to aid in the visualization of your intestinal tract. The contrast solution may cause some diarrhea. Depending on your individual set of symptoms, you may also receive an intravenous injection of x-ray contrast/dye. [facility], depending on the type of exam  you are having performed.  I would like to see you in the office 2 weeks after your EGD and colonoscopy.   If you are age 109 or younger, your body mass index should be between 19-25. Your Body mass index is 34.77 kg/m. If this is out of the aformentioned range listed, please consider follow up with your Primary Care Provider.   Thank you for trusting me with your gastrointestinal care!    Thornton Park, MD, MPH

## 2020-12-17 LAB — IGG: IgG (Immunoglobin G), Serum: 1564 mg/dL (ref 600–1640)

## 2020-12-18 ENCOUNTER — Encounter: Payer: Self-pay | Admitting: Gastroenterology

## 2020-12-18 LAB — ANTI-NUCLEAR AB-TITER (ANA TITER): ANA Titer 1: 1:40 {titer} — ABNORMAL HIGH

## 2020-12-18 LAB — ANA: Anti Nuclear Antibody (ANA): POSITIVE — AB

## 2020-12-19 ENCOUNTER — Telehealth: Payer: Self-pay

## 2020-12-19 DIAGNOSIS — D696 Thrombocytopenia, unspecified: Secondary | ICD-10-CM

## 2020-12-19 NOTE — Telephone Encounter (Signed)
-----   Message from Thornton Park, MD sent at 12/19/2020  8:36 AM EST ----- Given the dramatic change in his labs over the last month, please ask him to return for testing on Monday to include: Hepatic function panel, CMP, CBC, and INR.  Thank you.

## 2020-12-19 NOTE — Telephone Encounter (Signed)
Message left on pts personal voicemail letting him know to come in Monday for labs. Orders in epic. Left message for pt to call back if questions.

## 2020-12-22 ENCOUNTER — Other Ambulatory Visit: Payer: Self-pay

## 2020-12-22 ENCOUNTER — Other Ambulatory Visit (INDEPENDENT_AMBULATORY_CARE_PROVIDER_SITE_OTHER): Payer: No Typology Code available for payment source

## 2020-12-22 DIAGNOSIS — D696 Thrombocytopenia, unspecified: Secondary | ICD-10-CM | POA: Diagnosis not present

## 2020-12-22 DIAGNOSIS — R634 Abnormal weight loss: Secondary | ICD-10-CM

## 2020-12-22 DIAGNOSIS — R7989 Other specified abnormal findings of blood chemistry: Secondary | ICD-10-CM

## 2020-12-22 LAB — COMPREHENSIVE METABOLIC PANEL
ALT: 44 U/L (ref 0–53)
AST: 157 U/L — ABNORMAL HIGH (ref 0–37)
Albumin: 2.7 g/dL — ABNORMAL LOW (ref 3.5–5.2)
Alkaline Phosphatase: 339 U/L — ABNORMAL HIGH (ref 39–117)
BUN: 12 mg/dL (ref 6–23)
CO2: 24 mEq/L (ref 19–32)
Calcium: 8.1 mg/dL — ABNORMAL LOW (ref 8.4–10.5)
Chloride: 106 mEq/L (ref 96–112)
Creatinine, Ser: 0.94 mg/dL (ref 0.40–1.50)
GFR: 92.09 mL/min (ref >60.00)
Glucose, Bld: 103 mg/dL — ABNORMAL HIGH (ref 70–99)
Potassium: 3.9 mEq/L (ref 3.5–5.1)
Sodium: 135 mEq/L (ref 135–145)
Total Bilirubin: 13.2 mg/dL — ABNORMAL HIGH (ref 0.2–1.2)
Total Protein: 5.6 g/dL — ABNORMAL LOW (ref 6.0–8.3)

## 2020-12-22 LAB — HEPATIC FUNCTION PANEL
ALT: 44 U/L (ref 0–53)
AST: 157 U/L — ABNORMAL HIGH (ref 0–37)
Albumin: 2.7 g/dL — ABNORMAL LOW (ref 3.5–5.2)
Alkaline Phosphatase: 339 U/L — ABNORMAL HIGH (ref 39–117)
Bilirubin, Direct: 6.9 mg/dL — ABNORMAL HIGH (ref 0.0–0.3)
Total Bilirubin: 13.2 mg/dL — ABNORMAL HIGH (ref 0.2–1.2)
Total Protein: 5.6 g/dL — ABNORMAL LOW (ref 6.0–8.3)

## 2020-12-22 LAB — CBC WITH DIFFERENTIAL/PLATELET
Basophils Absolute: 0 10*3/uL (ref 0.0–0.1)
Basophils Relative: 0.8 % (ref 0.0–3.0)
Eosinophils Absolute: 0.1 10*3/uL (ref 0.0–0.7)
Eosinophils Relative: 1.3 % (ref 0.0–5.0)
HCT: 37.1 % — ABNORMAL LOW (ref 39.0–52.0)
Hemoglobin: 12.8 g/dL — ABNORMAL LOW (ref 13.0–17.0)
Lymphocytes Relative: 26.6 % (ref 12.0–46.0)
Lymphs Abs: 1.5 10*3/uL (ref 0.7–4.0)
MCHC: 34.4 g/dL (ref 30.0–36.0)
MCV: 94.3 fl (ref 78.0–100.0)
Monocytes Absolute: 0.5 10*3/uL (ref 0.1–1.0)
Monocytes Relative: 9.8 % (ref 3.0–12.0)
Neutro Abs: 3.4 10*3/uL (ref 1.4–7.7)
Neutrophils Relative %: 61.5 % (ref 43.0–77.0)
Platelets: 85 10*3/uL — ABNORMAL LOW (ref 150.0–400.0)
RBC: 3.94 Mil/uL — ABNORMAL LOW (ref 4.22–5.81)
RDW: 16.1 % — ABNORMAL HIGH (ref 11.5–15.5)
WBC: 5.5 10*3/uL (ref 4.0–10.5)

## 2020-12-22 LAB — PROTIME-INR
INR: 1.7 ratio — ABNORMAL HIGH (ref 0.8–1.0)
Prothrombin Time: 19 s — ABNORMAL HIGH (ref 9.6–13.1)

## 2020-12-24 ENCOUNTER — Telehealth: Payer: Self-pay | Admitting: Gastroenterology

## 2020-12-24 ENCOUNTER — Other Ambulatory Visit: Payer: Self-pay

## 2020-12-24 ENCOUNTER — Encounter (HOSPITAL_COMMUNITY): Payer: Self-pay

## 2020-12-24 ENCOUNTER — Ambulatory Visit (HOSPITAL_COMMUNITY)
Admission: RE | Admit: 2020-12-24 | Discharge: 2020-12-24 | Disposition: A | Discharge status: Home / Self Care | Payer: No Typology Code available for payment source | Source: Ambulatory Visit | Attending: Gastroenterology | Admitting: Gastroenterology

## 2020-12-24 ENCOUNTER — Telehealth: Payer: Self-pay

## 2020-12-24 ENCOUNTER — Other Ambulatory Visit (INDEPENDENT_AMBULATORY_CARE_PROVIDER_SITE_OTHER): Payer: No Typology Code available for payment source

## 2020-12-24 DIAGNOSIS — R634 Abnormal weight loss: Secondary | ICD-10-CM

## 2020-12-24 DIAGNOSIS — D696 Thrombocytopenia, unspecified: Secondary | ICD-10-CM

## 2020-12-24 DIAGNOSIS — K7011 Alcoholic hepatitis with ascites: Secondary | ICD-10-CM | POA: Diagnosis not present

## 2020-12-24 DIAGNOSIS — R7989 Other specified abnormal findings of blood chemistry: Secondary | ICD-10-CM | POA: Diagnosis not present

## 2020-12-24 DIAGNOSIS — K529 Noninfective gastroenteritis and colitis, unspecified: Secondary | ICD-10-CM

## 2020-12-24 LAB — COMPREHENSIVE METABOLIC PANEL
ALT: 39 U/L (ref 0–53)
AST: 138 U/L — ABNORMAL HIGH (ref 0–37)
Albumin: 2.4 g/dL — ABNORMAL LOW (ref 3.5–5.2)
Alkaline Phosphatase: 323 U/L — ABNORMAL HIGH (ref 39–117)
BUN: 12 mg/dL (ref 6–23)
CO2: 23 mEq/L (ref 19–32)
Calcium: 8.3 mg/dL — ABNORMAL LOW (ref 8.4–10.5)
Chloride: 106 mEq/L (ref 96–112)
Creatinine, Ser: 0.8 mg/dL (ref 0.40–1.50)
GFR: 100.53 mL/min (ref >60.00)
Glucose, Bld: 104 mg/dL — ABNORMAL HIGH (ref 70–99)
Potassium: 3.8 mEq/L (ref 3.5–5.1)
Sodium: 135 mEq/L (ref 135–145)
Total Bilirubin: 12.1 mg/dL — ABNORMAL HIGH (ref 0.2–1.2)
Total Protein: 5.6 g/dL — ABNORMAL LOW (ref 6.0–8.3)

## 2020-12-24 LAB — HEPATIC FUNCTION PANEL
ALT: 39 U/L (ref 0–53)
AST: 138 U/L — ABNORMAL HIGH (ref 0–37)
Albumin: 2.4 g/dL — ABNORMAL LOW (ref 3.5–5.2)
Alkaline Phosphatase: 323 U/L — ABNORMAL HIGH (ref 39–117)
Bilirubin, Direct: 5.9 mg/dL — ABNORMAL HIGH (ref 0.0–0.3)
Total Bilirubin: 12.1 mg/dL — ABNORMAL HIGH (ref 0.2–1.2)
Total Protein: 5.6 g/dL — ABNORMAL LOW (ref 6.0–8.3)

## 2020-12-24 LAB — CBC WITH DIFFERENTIAL/PLATELET
Basophils Absolute: 0.1 10*3/uL (ref 0.0–0.1)
Basophils Relative: 1 % (ref 0.0–3.0)
Eosinophils Absolute: 0.1 10*3/uL (ref 0.0–0.7)
Eosinophils Relative: 1.3 % (ref 0.0–5.0)
HCT: 35.8 % — ABNORMAL LOW (ref 39.0–52.0)
Hemoglobin: 12.5 g/dL — ABNORMAL LOW (ref 13.0–17.0)
Lymphocytes Relative: 26.6 % (ref 12.0–46.0)
Lymphs Abs: 1.4 10*3/uL (ref 0.7–4.0)
MCHC: 34.8 g/dL (ref 30.0–36.0)
MCV: 93.2 fl (ref 78.0–100.0)
Monocytes Absolute: 0.4 10*3/uL (ref 0.1–1.0)
Monocytes Relative: 8.4 % (ref 3.0–12.0)
Neutro Abs: 3.2 10*3/uL (ref 1.4–7.7)
Neutrophils Relative %: 62.7 % (ref 43.0–77.0)
Platelets: 80 10*3/uL — ABNORMAL LOW (ref 150.0–400.0)
RBC: 3.84 Mil/uL — ABNORMAL LOW (ref 4.22–5.81)
RDW: 16.5 % — ABNORMAL HIGH (ref 11.5–15.5)
WBC: 5.2 10*3/uL (ref 4.0–10.5)

## 2020-12-24 LAB — PROTIME-INR
INR: 1.7 ratio — ABNORMAL HIGH (ref 0.8–1.0)
Prothrombin Time: 19.2 s — ABNORMAL HIGH (ref 9.6–13.1)

## 2020-12-24 MED ORDER — IOHEXOL 300 MG/ML  SOLN: 100.0000 mL | Freq: Once | INTRAMUSCULAR | Status: DC | PRN

## 2020-12-24 MED ORDER — IOHEXOL 300 MG/ML  SOLN
100.0000 mL | Freq: Once | INTRAMUSCULAR | Status: AC | PRN
  Administered 2020-12-24: 100 mL via INTRAVENOUS

## 2020-12-24 NOTE — Telephone Encounter (Signed)
Pt states he will come after his CT scan for labs.

## 2020-12-24 NOTE — Telephone Encounter (Signed)
Dr. Anselm Pancoast, Radiology, called with the results of CT AP showing findings c/w cirrhosis, ascites splenomegaly. He was concerned about the right colon thickening which could be from colitis (might not be from ascites) and the right middle lobe patchy densities.  I reviewed Dr. Tarri Glenn 1/21 office note and todays blood work which has multiple abnormalities that are stable compared to results since 1/18 except for his total bili has slightly increased and Hgb has slightly decreased. No urgent action appears necessary tonight. I'm forwarding to Dr. Tarri Glenn for her to review tomorrow morning.

## 2020-12-24 NOTE — Telephone Encounter (Signed)
-----   Message from Thornton Park, MD sent at 12/24/2020 12:47 PM EST ----- Please remind the patient to have his labs today! Thank you!!! I am very worried about him.

## 2020-12-25 ENCOUNTER — Other Ambulatory Visit: Payer: Self-pay

## 2020-12-25 ENCOUNTER — Telehealth: Payer: Self-pay | Admitting: Gastroenterology

## 2020-12-25 DIAGNOSIS — R7989 Other specified abnormal findings of blood chemistry: Secondary | ICD-10-CM

## 2020-12-25 MED ORDER — PREDNISOLONE SODIUM PHOSPHATE 10 MG PO TBDP: 40.0000 mg | ORAL_TABLET | Freq: Every day | ORAL | 1 refills | Status: DC

## 2020-12-25 NOTE — Telephone Encounter (Signed)
Thank you. I agree with your recommendation. ER evaluation and likely admission is appropriate.

## 2020-12-25 NOTE — Telephone Encounter (Signed)
Pt states the formulation of the prednisolone that was sent to the pharmacy is too expensive. Pt needs a cheaper form sent in. Pt states that his stomach has been hurting all day and he is having a lot of bloating and some SOB. Discussed with him what Dr. Tarri Glenn had said that he should proceed to the ER where he can be evaluated and taken care of. Pt verbalized understanding.

## 2020-12-25 NOTE — Telephone Encounter (Signed)
Thanks for your help.

## 2020-12-26 ENCOUNTER — Ambulatory Visit: Payer: No Typology Code available for payment source | Admitting: Gastroenterology

## 2020-12-26 ENCOUNTER — Encounter (HOSPITAL_COMMUNITY): Payer: No Typology Code available for payment source

## 2020-12-26 ENCOUNTER — Inpatient Hospital Stay (HOSPITAL_COMMUNITY)
Admission: EM | Admit: 2020-12-26 | Discharge: 2020-12-29 | DRG: 433 | Disposition: A | Discharge status: Home / Self Care | Payer: No Typology Code available for payment source | Attending: Internal Medicine | Admitting: Internal Medicine

## 2020-12-26 ENCOUNTER — Inpatient Hospital Stay (HOSPITAL_COMMUNITY): Payer: No Typology Code available for payment source

## 2020-12-26 ENCOUNTER — Encounter (HOSPITAL_COMMUNITY): Payer: Self-pay | Admitting: Emergency Medicine

## 2020-12-26 ENCOUNTER — Other Ambulatory Visit: Payer: Self-pay

## 2020-12-26 ENCOUNTER — Emergency Department (HOSPITAL_COMMUNITY): Payer: No Typology Code available for payment source

## 2020-12-26 DIAGNOSIS — Z7141 Alcohol abuse counseling and surveillance of alcoholic: Secondary | ICD-10-CM

## 2020-12-26 DIAGNOSIS — R935 Abnormal findings on diagnostic imaging of other abdominal regions, including retroperitoneum: Secondary | ICD-10-CM | POA: Diagnosis not present

## 2020-12-26 DIAGNOSIS — Z87891 Personal history of nicotine dependence: Secondary | ICD-10-CM | POA: Diagnosis not present

## 2020-12-26 DIAGNOSIS — R161 Splenomegaly, not elsewhere classified: Secondary | ICD-10-CM | POA: Diagnosis present

## 2020-12-26 DIAGNOSIS — D696 Thrombocytopenia, unspecified: Secondary | ICD-10-CM

## 2020-12-26 DIAGNOSIS — R609 Edema, unspecified: Secondary | ICD-10-CM | POA: Diagnosis not present

## 2020-12-26 DIAGNOSIS — R188 Other ascites: Secondary | ICD-10-CM | POA: Diagnosis not present

## 2020-12-26 DIAGNOSIS — E871 Hypo-osmolality and hyponatremia: Secondary | ICD-10-CM | POA: Diagnosis present

## 2020-12-26 DIAGNOSIS — D61818 Other pancytopenia: Secondary | ICD-10-CM | POA: Diagnosis present

## 2020-12-26 DIAGNOSIS — K7011 Alcoholic hepatitis with ascites: Principal | ICD-10-CM

## 2020-12-26 DIAGNOSIS — K529 Noninfective gastroenteritis and colitis, unspecified: Secondary | ICD-10-CM | POA: Diagnosis present

## 2020-12-26 DIAGNOSIS — Z20822 Contact with and (suspected) exposure to covid-19: Secondary | ICD-10-CM | POA: Diagnosis present

## 2020-12-26 DIAGNOSIS — K766 Portal hypertension: Secondary | ICD-10-CM | POA: Diagnosis present

## 2020-12-26 DIAGNOSIS — D689 Coagulation defect, unspecified: Secondary | ICD-10-CM | POA: Diagnosis not present

## 2020-12-26 DIAGNOSIS — Z79899 Other long term (current) drug therapy: Secondary | ICD-10-CM

## 2020-12-26 DIAGNOSIS — K7010 Alcoholic hepatitis without ascites: Secondary | ICD-10-CM | POA: Diagnosis present

## 2020-12-26 DIAGNOSIS — F419 Anxiety disorder, unspecified: Secondary | ICD-10-CM | POA: Diagnosis present

## 2020-12-26 DIAGNOSIS — K760 Fatty (change of) liver, not elsewhere classified: Secondary | ICD-10-CM | POA: Diagnosis present

## 2020-12-26 DIAGNOSIS — R634 Abnormal weight loss: Secondary | ICD-10-CM | POA: Diagnosis present

## 2020-12-26 DIAGNOSIS — K828 Other specified diseases of gallbladder: Secondary | ICD-10-CM | POA: Diagnosis present

## 2020-12-26 DIAGNOSIS — B9620 Unspecified Escherichia coli [E. coli] as the cause of diseases classified elsewhere: Secondary | ICD-10-CM | POA: Diagnosis present

## 2020-12-26 DIAGNOSIS — R1907 Generalized intra-abdominal and pelvic swelling, mass and lump: Secondary | ICD-10-CM | POA: Diagnosis not present

## 2020-12-26 DIAGNOSIS — Z6837 Body mass index (BMI) 37.0-37.9, adult: Secondary | ICD-10-CM | POA: Diagnosis not present

## 2020-12-26 DIAGNOSIS — K7460 Unspecified cirrhosis of liver: Secondary | ICD-10-CM | POA: Diagnosis not present

## 2020-12-26 DIAGNOSIS — F1010 Alcohol abuse, uncomplicated: Secondary | ICD-10-CM | POA: Diagnosis present

## 2020-12-26 DIAGNOSIS — Z7952 Long term (current) use of systemic steroids: Secondary | ICD-10-CM

## 2020-12-26 DIAGNOSIS — E669 Obesity, unspecified: Secondary | ICD-10-CM | POA: Diagnosis present

## 2020-12-26 DIAGNOSIS — K7031 Alcoholic cirrhosis of liver with ascites: Secondary | ICD-10-CM | POA: Diagnosis not present

## 2020-12-26 DIAGNOSIS — Z885 Allergy status to narcotic agent status: Secondary | ICD-10-CM | POA: Diagnosis not present

## 2020-12-26 HISTORY — DX: Alcoholic hepatitis with ascites: K70.11

## 2020-12-26 LAB — MAGNESIUM: Magnesium: 1.6 mg/dL — ABNORMAL LOW (ref 1.7–2.4)

## 2020-12-26 LAB — URINALYSIS, ROUTINE W REFLEX MICROSCOPIC
Glucose, UA: NEGATIVE mg/dL
Ketones, ur: NEGATIVE mg/dL
Leukocytes,Ua: NEGATIVE
Nitrite: NEGATIVE
Protein, ur: NEGATIVE mg/dL
Specific Gravity, Urine: 1.016 (ref 1.005–1.030)
pH: 6 (ref 5.0–8.0)

## 2020-12-26 LAB — PHOSPHORUS: Phosphorus: 3.7 mg/dL (ref 2.5–4.6)

## 2020-12-26 LAB — COMPREHENSIVE METABOLIC PANEL
ALT: 48 U/L — ABNORMAL HIGH (ref 0–44)
AST: 167 U/L — ABNORMAL HIGH (ref 15–41)
Albumin: 2 g/dL — ABNORMAL LOW (ref 3.5–5.0)
Alkaline Phosphatase: 339 U/L — ABNORMAL HIGH (ref 38–126)
Anion gap: 8 (ref 5–15)
BUN: 8 mg/dL (ref 6–20)
CO2: 19 mmol/L — ABNORMAL LOW (ref 22–32)
Calcium: 8.1 mg/dL — ABNORMAL LOW (ref 8.9–10.3)
Chloride: 107 mmol/L (ref 98–111)
Creatinine, Ser: 0.86 mg/dL (ref 0.61–1.24)
GFR, Estimated: >60 mL/min (ref >60)
Glucose, Bld: 117 mg/dL — ABNORMAL HIGH (ref 70–99)
Potassium: 3.8 mmol/L (ref 3.5–5.1)
Sodium: 134 mmol/L — ABNORMAL LOW (ref 135–145)
Total Bilirubin: 10.6 mg/dL — ABNORMAL HIGH (ref 0.3–1.2)
Total Protein: 5.9 g/dL — ABNORMAL LOW (ref 6.5–8.1)

## 2020-12-26 LAB — CBC
HCT: 35 % — ABNORMAL LOW (ref 39.0–52.0)
Hemoglobin: 12.2 g/dL — ABNORMAL LOW (ref 13.0–17.0)
MCH: 32.4 pg (ref 26.0–34.0)
MCHC: 34.9 g/dL (ref 30.0–36.0)
MCV: 93.1 fL (ref 80.0–100.0)
Platelets: 81 10*3/uL — ABNORMAL LOW (ref 150–400)
RBC: 3.76 MIL/uL — ABNORMAL LOW (ref 4.22–5.81)
RDW: 16.9 % — ABNORMAL HIGH (ref 11.5–15.5)
WBC: 6.2 10*3/uL (ref 4.0–10.5)
nRBC: 0 % (ref 0.0–0.2)

## 2020-12-26 LAB — SARS CORONAVIRUS 2 BY RT PCR (HOSPITAL ORDER, PERFORMED IN ~~LOC~~ HOSPITAL LAB): SARS Coronavirus 2: NEGATIVE

## 2020-12-26 LAB — BILIRUBIN, DIRECT: Bilirubin, Direct: 5.5 mg/dL — ABNORMAL HIGH (ref 0.0–0.2)

## 2020-12-26 LAB — HIV ANTIBODY (ROUTINE TESTING W REFLEX): HIV Screen 4th Generation wRfx: NONREACTIVE

## 2020-12-26 LAB — AMMONIA: Ammonia: 39 umol/L — ABNORMAL HIGH (ref 9–35)

## 2020-12-26 LAB — CK: Total CK: 226 U/L (ref 49–397)

## 2020-12-26 LAB — LIPASE, BLOOD: Lipase: 47 U/L (ref 11–51)

## 2020-12-26 MED ORDER — SPIRONOLACTONE 25 MG PO TABS: 100.0000 mg | ORAL_TABLET | Freq: Every day | ORAL | Status: DC

## 2020-12-26 MED ORDER — PREDNISOLONE SODIUM PHOSPHATE 10 MG PO TBDP: 40.0000 mg | ORAL_TABLET | Freq: Every day | ORAL | Status: DC

## 2020-12-26 MED ORDER — ADULT MULTIVITAMIN W/MINERALS CH
1.0000 | ORAL_TABLET | Freq: Every day | ORAL | Status: DC
  Administered 2020-12-26 – 2020-12-29 (×4): 1 via ORAL
  Filled 2020-12-26 (×4): qty 1

## 2020-12-26 MED ORDER — THIAMINE HCL 100 MG PO TABS
100.0000 mg | ORAL_TABLET | Freq: Every day | ORAL | Status: DC
  Administered 2020-12-26 – 2020-12-29 (×4): 100 mg via ORAL
  Filled 2020-12-26 (×4): qty 1

## 2020-12-26 MED ORDER — DICYCLOMINE HCL 10 MG PO CAPS
10.0000 mg | ORAL_CAPSULE | Freq: Three times a day (TID) | ORAL | Status: DC
  Administered 2020-12-26 – 2020-12-29 (×10): 10 mg via ORAL
  Filled 2020-12-26 (×11): qty 1

## 2020-12-26 MED ORDER — THIAMINE HCL 100 MG/ML IJ SOLN
100.0000 mg | Freq: Every day | INTRAMUSCULAR | Status: DC
  Filled 2020-12-26: qty 2

## 2020-12-26 MED ORDER — FUROSEMIDE 20 MG PO TABS: 40.0000 mg | ORAL_TABLET | Freq: Every day | ORAL | Status: DC

## 2020-12-26 MED ORDER — ALBUMIN HUMAN 25 % IV SOLN
12.5000 g | Freq: Four times a day (QID) | INTRAVENOUS | Status: DC
  Administered 2020-12-26 – 2020-12-28 (×7): 12.5 g via INTRAVENOUS
  Filled 2020-12-26 (×8): qty 50

## 2020-12-26 MED ORDER — FOLIC ACID 1 MG PO TABS
1.0000 mg | ORAL_TABLET | Freq: Every day | ORAL | Status: DC
  Administered 2020-12-26 – 2020-12-29 (×4): 1 mg via ORAL
  Filled 2020-12-26 (×4): qty 1

## 2020-12-26 MED ORDER — LORAZEPAM 1 MG PO TABS: 1.0000 mg | ORAL_TABLET | ORAL | Status: DC | PRN

## 2020-12-26 MED ORDER — PREDNISOLONE 5 MG PO TABS
40.0000 mg | ORAL_TABLET | Freq: Every day | ORAL | Status: DC
  Administered 2020-12-26 – 2020-12-29 (×4): 40 mg via ORAL
  Filled 2020-12-26 (×4): qty 8

## 2020-12-26 MED ORDER — FENTANYL CITRATE (PF) 100 MCG/2ML IJ SOLN
25.0000 ug | Freq: Once | INTRAMUSCULAR | Status: AC
  Administered 2020-12-26: 25 ug via INTRAVENOUS
  Filled 2020-12-26: qty 2

## 2020-12-26 MED ORDER — LORAZEPAM 2 MG/ML IJ SOLN: 1.0000 mg | INTRAMUSCULAR | Status: DC | PRN

## 2020-12-26 MED ORDER — PHYTONADIONE 5 MG PO TABS
5.0000 mg | ORAL_TABLET | Freq: Once | ORAL | Status: AC
  Administered 2020-12-26: 5 mg via ORAL
  Filled 2020-12-26: qty 1

## 2020-12-26 NOTE — H&P (Signed)
History and Physical    MASTER Ian Hall:[id number] DOB: Dec 02, 1965 DOA: 12/26/2020  PCP: Janeece Agee, NP (Confirm with patient/family/NH records and if not entered, this has to be entered at [facility] point of entry) Patient coming from: Home  I have personally briefly reviewed patient's old medical records in [facility]  Chief Complaint: Abd pain and diarrhea  HPI: Ian Hall is a 55 y.o. male with medical history significant of alcohol abuse with binge drinking, liver cirrhosis, Dumping syndrome, presented with worsening abdominal pain and diarrhea.  Patient admitted to drinking occasionally, reportedly last drinking was around Christmas with 5 to 6 glasses of tequila.  Patient has been having postprandial diarrhea sometimes mixed with blood for few weeks.  He went to see GI 10 days ago, was prescribed to his Metamucil twice daily and Bentyl 3 times daily AC.  After taking them for 1 week, he started to develop worsening of diarrhea, with 4-5 bouts of loose/watery diarrhea and a new periumbilical abdominal pain, which he described as cramping like usually happens when he bends down or turns around or sitting down. No fever, no N/V.  Recent GI work-up which also showed worsening of bilirubinemia and autoimmune hepatitis and malignancy work-ups were sent.   Patient sent to ED yesterday by GI for work-up of worsening of bilirubinemia concerning for alcoholic hepatitis and p.o. prednisone also started yesterday.  ED Course: AST 137 ALT 48 total bili 10.6 albumin 2.0 INR 1.7.  CT abdomen showed cirrhosis with portal hypertension.  Suspicious for colitis.  Review of Systems: As per HPI otherwise 14 point review of systems negative.    Past Medical History:  Diagnosis Date  . Anxiety   . COVID-19     Past Surgical History:  Procedure Laterality Date  . ANKLE SURGERY Right      reports that he quit smoking about 18 years ago. His smoking use included cigarettes. He has never  used smokeless tobacco. He reports previous alcohol use. He reports that he does not use drugs.  Allergies  Allergen Reactions  . Hydrocodone Itching    Noted with hydrocodone cough syrup. Itching only, no hives.  02/03/16: denied itching with recent hydrocodone cough syrup. Only one episode of itching previously.     Family History  Problem Relation Age of Onset  . Diabetes Mother   . Memory loss Mother   . Diabetes Father   . Diabetes Cousin      Prior to Admission medications   Medication Sig Start Date End Date Taking? Authorizing Provider  dicyclomine (BENTYL) 10 MG capsule Take 1 capsule (10 mg total) by mouth 4 (four) times daily -  before meals and at bedtime. 12/16/20  Yes Tressia Danas, MD  Psyllium (METAMUCIL) 28.3 % POWD Take 1 Dose by mouth 2 (two) times daily. Patient taking differently: Take 1 Dose by mouth 4 (four) times daily. Mix in 8 oz water and drink 12/16/20  Yes Beavers, Cala Bradford, MD  prednisoLONE (ORAPRED ODT) 10 MG disintegrating tablet Take 4 tablets (40 mg total) by mouth daily. 12/25/20   Tressia Danas, MD    Physical Exam: Vitals:   12/26/20 1400 12/26/20 1415 12/26/20 1430 12/26/20 1445  BP: 137/75 119/76 122/73 125/70  Pulse: 79 82 76 75  Resp: 15 (!) 22 19 20   Temp:      TempSrc:      SpO2: 99% 100% 99% 100%  Weight:      Height:  Constitutional: NAD, calm, comfortable Vitals:   12/26/20 1400 12/26/20 1415 12/26/20 1430 12/26/20 1445  BP: 137/75 119/76 122/73 125/70  Pulse: 79 82 76 75  Resp: 15 (!) 22 19 20   Temp:      TempSrc:      SpO2: 99% 100% 99% 100%  Weight:      Height:       Eyes: PERRL, lids and conjunctivae normal ENMT: Mucous membranes are moist. Posterior pharynx clear of any exudate or lesions.Normal dentition.  Neck: normal, supple, no masses, no thyromegaly Respiratory: clear to auscultation bilaterally, no wheezing, no crackles. Normal respiratory effort. No accessory muscle use.  Cardiovascular: Regular  rate and rhythm, no murmurs / rubs / gallops. No extremity edema. 2+ pedal pulses. No carotid bruits.  Abdomen: Distended, mild tenderness, no masses palpated. No hepatosplenomegaly. Bowel sounds positive.  Musculoskeletal: no clubbing / cyanosis. No joint deformity upper and lower extremities. Good ROM, no contractures. Normal muscle tone.  Skin: no rashes, lesions, ulcers. No induration Neurologic: CN 2-12 grossly intact. Sensation intact, DTR normal. Strength 5/5 in all 4.  Psychiatric: Normal judgment and insight. Alert and oriented x 3. Normal mood.     Labs on Admission: I have personally reviewed following labs and imaging studies  CBC: Recent Labs  Lab 12/22/20 1045 12/24/20 1333 12/26/20 1013  WBC 5.5 5.2 6.2  NEUTROABS 3.4 3.2  --   HGB 12.8* 12.5* 12.2*  HCT 37.1* 35.8* 35.0*  MCV 94.3 93.2 93.1  PLT 85.0 Repeated and verified X2.* 80.0* 81*   Basic Metabolic Panel: Recent Labs  Lab 12/22/20 1045 12/24/20 1333 12/26/20 1013  NA 135 135 134*  K 3.9 3.8 3.8  CL 106 106 107  CO2 24 23 19*  GLUCOSE 103* 104* 117*  BUN 12 12 8   CREATININE 0.94 0.80 0.86  CALCIUM 8.1* 8.3* 8.1*   GFR: Estimated Creatinine Clearance: 115.1 mL/min (by C-G formula based on SCr of 0.86 mg/dL). Liver Function Tests: Recent Labs  Lab 12/22/20 1045 12/24/20 1333 12/26/20 1013  AST 157*  157* 138*  138* 167*  ALT 44  44 39  39 48*  ALKPHOS 339*  339* 323*  323* 339*  BILITOT 13.2*  13.2* 12.1*  12.1* 10.6*  PROT 5.6*  5.6* 5.6*  5.6* 5.9*  ALBUMIN 2.7*  2.7* 2.4*  2.4* 2.0*   Recent Labs  Lab 12/26/20 1013  LIPASE 47   No results for input(s): AMMONIA in the last 168 hours. Coagulation Profile: Recent Labs  Lab 12/22/20 1045 12/24/20 1333  INR 1.7* 1.7*   Cardiac Enzymes: No results for input(s): CKTOTAL, CKMB, CKMBINDEX, TROPONINI in the last 168 hours. BNP (last 3 results) No results for input(s): PROBNP in the last 8760 hours. HbA1C: No results for  input(s): HGBA1C in the last 72 hours. CBG: No results for input(s): GLUCAP in the last 168 hours. Lipid Profile: No results for input(s): CHOL, HDL, LDLCALC, TRIG, CHOLHDL, LDLDIRECT in the last 72 hours. Thyroid Function Tests: No results for input(s): TSH, T4TOTAL, FREET4, T3FREE, THYROIDAB in the last 72 hours. Anemia Panel: No results for input(s): VITAMINB12, FOLATE, FERRITIN, TIBC, IRON, RETICCTPCT in the last 72 hours. Urine analysis:    Component Value Date/Time   COLORURINE AMBER (A) 12/26/2020 1115   APPEARANCEUR CLEAR 12/26/2020 1115   LABSPEC 1.016 12/26/2020 1115   PHURINE 6.0 12/26/2020 1115   GLUCOSEU NEGATIVE 12/26/2020 1115   HGBUR MODERATE (A) 12/26/2020 1115   BILIRUBINUR SMALL (A) 12/26/2020 1115  BILIRUBINUR small (A) 10/17/2020 1627   KETONESUR NEGATIVE 12/26/2020 1115   PROTEINUR NEGATIVE 12/26/2020 1115   UROBILINOGEN 1.0 10/17/2020 1627   NITRITE NEGATIVE 12/26/2020 1115   LEUKOCYTESUR NEGATIVE 12/26/2020 1115    Radiological Exams on Admission: DG Chest Portable 1 View  Result Date: 12/26/2020 CLINICAL DATA:  Cough EXAM: PORTABLE CHEST 1 VIEW COMPARISON:  03/22/2018 FINDINGS: The heart size and mediastinal contours are within normal limits. Mild bibasilar interstitial prominence. No focal airspace consolidation, pleural effusion, or pneumothorax. The visualized skeletal structures are unremarkable. IMPRESSION: Mild bibasilar interstitial prominence, may reflect bronchitic type lung changes versus developing atypical/viral infection. Electronically Signed   By: Duanne Guess D.O.   On: 12/26/2020 13:44    EKG: Independently reviewed.   Assessment/Plan Active Problems:   Alcoholic hepatitis with ascites   Hepatitis, alcoholic, acute  (please populate well all problems here in Problem List. (For example, if patient is on BP meds at home and you resume or decide to hold them, it is a problem that needs to be her. Same for CAD, COPD, HLD and so  on)  Acute alcoholic hepatitis -Agreed with steroid.  Acute decompensated liver cirrhosis -Evidenced by increasing ascites, decreased albumin level as well as elevated INR. -Discussed with GI, recommend RUQ ultrasound to quantify ascites then IR evaluation for paracentesis. -Albumin infusion every 6 hours x2 days -Lasix and Aldactone  Worsening of acute on chronic diarrhea -Colitis versus Metamucil -Discontinue Metamucil. -Colonoscopy?  Thrombocytopenia -Secondary to portal hypertension and slpenomegaly -No chemical DVT prophylaxis.  Hemoglobinuria versus myoglobinuria? - Urine chem showed moderate Hgb, but under microscope, only 0-5 RBC -Check CK  Alcohol abuse -No symptoms signs of acute withdrawal -CIWA protocol with as needed benzos   DVT prophylaxis: SCD Code Status: Full Code Family Communication: Wife over phone Disposition Plan: Expect more than 2 midnight hospital stay Consults called: GI Admission status: Tele admit   Emeline General MD [facility] Pager [phone]  12/26/2020, 3:14 PM

## 2020-12-26 NOTE — ED Notes (Signed)
Pt reports diffuse abd pain x 2 years which was worse yesterday, diarrhea after eating. Pt describes pain as cramping.

## 2020-12-26 NOTE — ED Triage Notes (Signed)
Emergency Medicine Provider Triage Evaluation Note  Ian Hall , a 55 y.o. male  was evaluated in triage.  Pt complains of abdominal pain.  Review of Systems  Positive: Worsening pain Negative: Cough, respiratory symptoms  Physical Exam  BP (!) 160/85 (BP Location: Right Arm)   Pulse 98   Temp 99 F (37.2 C) (Oral)   Resp 18   Ht 1.702 m (5' 7" )   Wt 108 kg   SpO2 100%   BMI 37.28 kg/m  Gen:   Awake, no distress   HEENT:  Atraumatic  Resp:  Normal effort  Cardiac:  Normal rate  Abd:   Nondistended, nontender diffuse swelling with ttp diffusely llq > MSK:   Moves extremities without difficulty  Neuro:  Speech clear   Medical Decision Making  Medically screening exam initiated at 10:17 AM.  Appropriate orders placed.  Ian Hall was informed that the remainder of the evaluation will be completed by another provider, this initial triage assessment does not replace that evaluation, and the importance of remaining in the ED until their evaluation is complete.  Clinical Impression  Patient with known cirrhosis and ascites with probable right colon thickening possible colitis- bili increasing and hgb decreased.  Patient presents due to worsening pain. He recently had covid-Jan 10 and feels improved from those symptoms. Plan labs, iv fluids, will need further assessment and possible iv abx    Pattricia Boss, MD 12/26/20 1021

## 2020-12-26 NOTE — ED Notes (Signed)
Called lab to have the bilirubin added on to previous collection

## 2020-12-26 NOTE — ED Triage Notes (Addendum)
Pt reports abd pain with diarrhea and some swelling to abdomen x5 days, hx of liver disease.

## 2020-12-26 NOTE — Consult Note (Addendum)
[facility] Consult: 1:54 PM 12/26/2020  LOS: 0 days    Referring Provider: Dr. Deno Etienne. Primary Care Physician:  Maximiano Coss, NP Primary Gastroenterologist:  Dr. Thornton Park.    Reason for Consultation: Abdominal pain   HPI: Ian Hall is a 55 y.o. male.    Pt for to Dr. Tarri Glenn and seen for the first initial evaluation at the office 12/16/2020 regarding abnormal bilirubin, thrombocytopenia w platelets 64K.  T bili 5.4, alk phos 285, AST/ALT 116/37.  Hx fatty liver.  Hepatitis serologies negative. Clinically he complained of postprandial diarrhea associated with abdominal pain in the mid to lower abdomen.  Rare blood in the stool but has not seen these in several weeks..  Slight improvement with Pepto-Bismol.  Anorexia, 40 pound weight loss in 8 months.  He stopped eating as much because of the postprandial diarrhea and periprandial pain.  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  Prior history of drinking a 12 pack of beer at a time but quit alcohol ~ 2014.  He did have an isolated incidence of drinking about 8 shots of tequila on New Year's Day 2022 but otherwise abstinent.  Has donated blood on several occasions, the last was about 6 months ago, no prior blood transfusion, history of jaundice, scleral icterus, use of illicit IV or intranasal street drugs. Dr. Tarri Glenn plan was to obtain lab work, begin dicyclomine, Metamucil, CTAP with contrast and EGD and colonoscopy (set up for 12/30/2020), cease all consumption of alcohol. After that visit the patient underwent the CTAP 1/26.   Repeat blood work was essentially stable.   Additional labs included positive ANA w titer of 1:40.  IgG normal at 1564.  Ferritin 861.  Calculated discriminant function  score was 40 Prescribed prednisolone which was too expensive for the patient, so he has not started this medication.  However he has been taking the dicyclomine and Metamucil as prescribed.  Yesterday developed more severe abdominal pain in the mid to left mid abdomen.  Abdominal swelling.  No change in the bowel habits with continued loose stools.  No nausea.  His weight has gone from 222 pounds last week to 238 pounds yesterday.  12/24/2020 CTAP w contrast: Cirrhotic liver.  Moderate ascites.  Splenomegaly.  Colonic wall thickening involving the a sending and hepatic flexure regions.  Thickening may be due to ascites but there is concern for colitis in the right colon.  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  Subcutaneous edema around the umbilicus.  Diffuse mesenteric edema.  Subtle RML patchiness,?  Mild infection or inflammation. 12/26/2020 CXR with mild basilar interstitial prominence, bronchitic changes vs developing atypical/viral infection. WBC 6.2.  Hb 12.2.  Platelets 81K.  INR 1.7 on 1/26. Labs of 1/26  >> 12/26/2020 : T bili 12.6 >> 10.6.  Alkaline phosphatase 323 >> 339.  AST/ALT 138/39 >> 167/48. Renal function within normal limits.  Sodium 134.  COVID-19 rapid test negative today.  Family history  of diabetes, renal disease and kidney transplant in his mother. No family history of colon cancer or polyps, uterine/endometrial cancer, pancreatic cancer, gastric, stomach cancers, liver disease..  Lives with his wife and 13 and 64 year old children.  Works at a [employer] research [employer], [employer].  Past Medical History:  Diagnosis Date  . Anxiety   . COVID-19     Past Surgical History:  Procedure Laterality Date  . ANKLE SURGERY Right     Prior to Admission medications   Medication Sig Start Date End Date Taking? Authorizing Provider  dicyclomine (BENTYL) 10 MG capsule Take 1 capsule (10 mg total) by  mouth 4 (four) times daily -  before meals and at bedtime. 12/16/20   Thornton Park, MD  ibuprofen (ADVIL) 200 MG tablet Take 400 mg by mouth as needed.    [provider]  prednisoLONE (ORAPRED ODT) 10 MG disintegrating tablet Take 4 tablets (40 mg total) by mouth daily. 12/25/20   Thornton Park, MD  Psyllium (METAMUCIL) 28.3 % POWD Take 1 Dose by mouth 2 (two) times daily. 12/16/20   Thornton Park, MD    Scheduled Meds:  Infusions:  PRN Meds:    Allergies as of 12/26/2020 - Review Complete 12/26/2020  Allergen Reaction Noted  . Hydrocodone Itching 08/26/2015    Family History  Problem Relation Age of Onset  . Diabetes Mother   . Memory loss Mother   . Diabetes Father   . Diabetes Cousin     Social History   Socioeconomic History  . Marital status: Married    Spouse name: Not on file  . Number of children: 0  . Years of education: Not on file  . Highest education level: Not on file  Occupational History  . Occupation: IT sales professional: [employer]  Tobacco Use  . Smoking status: Former Smoker    Types: Cigarettes    Quit date: 12/16/2002    Years since quitting: 18.0  . Smokeless tobacco: Never Used  Vaping Use  . Vaping Use: Never used  Substance and Sexual Activity  . Alcohol use: Not Currently    Comment: none for 3-4 years (stated 12/16/2020)  . Drug use: No  . Sexual activity: Yes    Birth control/protection: None  Other Topics Concern  . Not on file  Social History Narrative  . Not on file   Social Determinants of Health   Financial Resource Strain: Not on file  Food Insecurity: Not on file  Transportation Needs: Not on file  Physical Activity: Not on file  Stress: Not on file  Social Connections: Not on file  Intimate Partner Violence: Not on file    REVIEW OF SYSTEMS: Constitutional: Slight weakness, no profound fatigue or weakness. ENT:  No nose bleeds Pulm: Shortness of breath or cough. CV:  No  palpitations, no angina.  Occasional lower extremity edema on the right leg GU: Hematuria several weeks ago and providers office notes mention referral to a urologist not sure if this is happened as I do not see any documentation of this., was never given a diagnosis or set up with follow-up office visit. GI: See HPI. Heme: No unusual bleeding or bruising Transfusions: None.  In fact he has been of blood donator. Neuro: Gets cramps in his fingers and nocturnal leg cramps.  No headaches, no peripheral tingling or numbness.  No syncope, no seizures. Derm:  No itching, no rash or sores.  Endocrine:  No sweats or chills.  No polyuria or dysuria  Immunization: Vaccinated for [employer] vaccine in March and April 2021. Travel:  None beyond local counties in last few months.    PHYSICAL EXAM: Vital signs in last 24 hours: Vitals:   12/26/20 1300 12/26/20 1315  BP: 131/84 139/80  Pulse: 73 73  Resp: 19 19  Temp:    SpO2: 99% 99%   Wt Readings from Last 3 Encounters:  12/26/20 108 kg  12/16/20 100.7 kg  10/29/20 102.8 kg    General: Pleasant, icteric, comfortable, does not look acutely ill Head: No facial asymmetry or swelling.  No signs of head trauma. Eyes: Icteric sclera.  Conjunctiva pink. Ears: Not hard of hearing Nose: No discharge or congestion Mouth: Good dentition.  Tongue midline.  Mucosa moist, pink, clear. Neck: No JVD, no masses, no thyromegaly. Lungs: No labored breathing or cough.  Lungs clear bilaterally. Heart: RRR.  No MRG.  S1, S2 present Abdomen: Soft, somewhat distended.  Tender across the left mid and umbilical area.  No guarding or rebound.  No hernias, organomegaly, bruits appreciated.  Active bowel sounds..   Rectal: No rectal masses.  Exam glove clean without stool or blood. Musc/Skeltl: No CCE. Extremities: Oriented x3. Neurologic: Pleasant, cooperative, moves all 4 limbs without weakness.  No tremors. Skin: No rash, no sores, no telangiectasia.   Possibly jaundiced but he is Hispanic so he has olive/tanned pigmentation so hard to appreciate jaundice Nodes: No cervical adenopathy. Psych: Pleasant, calm, cooperative, fluid speech.  Intake/Output from previous day: No intake/output data recorded. Intake/Output this shift: No intake/output data recorded.  LAB RESULTS: Recent Labs    12/24/20 1333 12/26/20 1013  WBC 5.2 6.2  HGB 12.5* 12.2*  HCT 35.8* 35.0*  PLT 80.0* 81*   BMET Lab Results  Component Value Date   NA 134 (L) 12/26/2020   NA 135 12/24/2020   NA 135 12/22/2020   K 3.8 12/26/2020   K 3.8 12/24/2020   K 3.9 12/22/2020   CL 107 12/26/2020   CL 106 12/24/2020   CL 106 12/22/2020   CO2 19 (L) 12/26/2020   CO2 23 12/24/2020   CO2 24 12/22/2020   GLUCOSE 117 (H) 12/26/2020   GLUCOSE 104 (H) 12/24/2020   GLUCOSE 103 (H) 12/22/2020   BUN 8 12/26/2020   BUN 12 12/24/2020   BUN 12 12/22/2020   CREATININE 0.86 12/26/2020   CREATININE 0.80 12/24/2020   CREATININE 0.94 12/22/2020   CALCIUM 8.1 (L) 12/26/2020   CALCIUM 8.3 (L) 12/24/2020   CALCIUM 8.1 (L) 12/22/2020   LFT Recent Labs    12/24/20 1333 12/26/20 1013  PROT 5.6*  5.6* 5.9*  ALBUMIN 2.4*  2.4* 2.0*  AST 138*  138* 167*  ALT 39  39 48*  ALKPHOS 323*  323* 339*  BILITOT 12.1*  12.1* 10.6*  BILIDIR 5.9*  --    PT/INR Lab Results  Component Value Date   INR 1.7 (H) 12/24/2020   INR 1.7 (H) 12/22/2020   INR 1.6 (H) 12/16/2020   Hepatitis Panel No results for input(s): HEPBSAG, HCVAB, HEPAIGM, HEPBIGM in the last 72 hours. C-Diff No components found for: CDIFF Lipase     Component Value Date/Time   LIPASE 47 12/26/2020 1013    Drugs of Abuse  No results found for: LABOPIA, COCAINSCRNUR, LABBENZ, AMPHETMU, THCU, LABBARB   RADIOLOGY STUDIES: DG Chest Portable 1 View  Result Date: 12/26/2020 CLINICAL DATA:  Cough EXAM: PORTABLE CHEST 1 VIEW COMPARISON:  03/22/2018 FINDINGS: The heart size and mediastinal contours are  within normal limits. Mild bibasilar interstitial prominence. No focal airspace consolidation, pleural effusion, or pneumothorax. The visualized skeletal structures are unremarkable. IMPRESSION: Mild bibasilar interstitial prominence, may reflect bronchitic type lung changes versus developing atypical/viral infection. Electronically Signed   By: Davina Poke D.O.   On: 12/26/2020 13:44     IMPRESSION:   *   Acute on chronic abdominal pain.  Postprandial nonbloody diarrhea/dumping syndrome. Contrasted CTAP on the 26th confirmed cirrhosis, moderate ascites, splenomegaly and colon thickening in the ascending and hepatic flexure regions possibly due to ascites but cannot rule out colitis.  *     Cirrhosis of the liver.  Elevated ANA, normal IgG. Discriminant function score of 40 based on labs from 3 days ago.  Unable to afford scribed prednisolone so it was not initiated. LFTs elevated, jaundice.  AST/ALT pattern consistent with alcohol use.  Patient says that the only time he drank in the last 8 years was several shots of tequila on Christmas day.  However Dr. Libby Maw note mentioned that he had been periodically drinking several beers at a time.  *     Hematuria, this was happening several weeks ago and has resolved.  Unclear if he ever actually saw a urologist.  No documentation of urology visit in the chart.  *     Thrombocytopenia, splenomegaly  *     Mild hyponatremia.  *    Chart documentation of COVID-19 but I do not see any COVID-19 positive results.  He was vaccinated with the [place] vaccines in March and April 2021. Currently COVID-19 negative on rapid test.    PLAN:     *    Ordered abdominal ultrasound to assess for tappable ascites in which case we should perform paracentesis for SBP rule out. No diuretics until signif ascites is confirmed.    *    ?  Pursue colonoscopy prior to discharge.  He has a colonoscopy set up for the endoscopy center on 2/1 which is 4 days  away.  *     Okay to have solid diet once the ultrasound been performed.  *      Begin prednisolone 40 mg daily.      Azucena Freed  12/26/2020, 1:54 PM Phone [phone]

## 2020-12-26 NOTE — ED Provider Notes (Signed)
[facility] EMERGENCY DEPARTMENT Provider Note   CSN: [id number] Arrival date & time: 12/26/20  1006     History Chief Complaint  Patient presents with  . Abdominal Pain  . Diarrhea    Ian Hall is a 55 y.o. male.  HPI 55 year old male with a history of psoriasis, ascites, anxiety, Covid presents to the ER with complaints of ongoing abdominal pain which is worsened over the last few days.  He states that he has been having abdominal issues for over 2 years, but it has been worsening over the last month and thus sought valuation by Dr. Tarri Glenn with GI per chart review.  He complains of ongoing abdominal pain just below the level of the umbilicus.  Relieved with rest, worse with movement.  He also feels as though his eyes have been more yellow than normal.  Denies any dysuria or hematuria.  Has had loose diarrhea but with no blood.  He appears to have had an outpatient CT done ordered by Dr. Tarri Glenn 2 days ago.  This showed some colonic thickening, questionable colitis.  Per chart review, appears that he was referred to the ER for further evaluation and possible admission.  Labs showed slightly decreasing hemoglobin, however patient is denying any blood in his stool.  He states that he had had Covid on January 10, but does feel improved from this.  Denies any nausea or vomiting.  No known fevers or chills.    Past Medical History:  Diagnosis Date  . Anxiety   . COVID-19     Patient Active Problem List   Diagnosis Date Noted  . 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 ANKLE SURGERY Right        Family History  Problem Relation Age of Onset  . Diabetes Mother   . Memory loss Mother   . Diabetes Father   . Diabetes Cousin     Social History   Tobacco Use  . Smoking status: Former Smoker    Types: Cigarettes    Quit date: 12/16/2002    Years since quitting: 18.0  . Smokeless tobacco: Never Used  Vaping  Use  . Vaping Use: Never used  Substance Use Topics  . Alcohol use: Not Currently    Comment: none for 3-4 years (stated 12/16/2020)  . Drug use: No    Home Medications Prior to Admission medications   Medication Sig Start Date End Date Taking? Authorizing Provider  dicyclomine (BENTYL) 10 MG capsule Take 1 capsule (10 mg total) by mouth 4 (four) times daily -  before meals and at bedtime. 12/16/20  Yes Thornton Park, MD  Psyllium (METAMUCIL) 28.3 % POWD Take 1 Dose by mouth 2 (two) times daily. Patient taking differently: Take 1 Dose by mouth 4 (four) times daily. Mix in 8 oz water and drink 12/16/20  Yes Beavers, Joelene Millin, MD  prednisoLONE (ORAPRED ODT) 10 MG disintegrating tablet Take 4 tablets (40 mg total) by mouth daily. 12/25/20   Thornton Park, MD    Allergies    Hydrocodone  Review of Systems   Review of Systems  Constitutional: Negative for chills and fever.  HENT: Negative for ear pain and sore throat.   Eyes: Negative for pain and visual disturbance.  Respiratory: Negative for cough and shortness of breath.   Cardiovascular: Negative for chest pain and palpitations.  Gastrointestinal: Positive for abdominal pain and diarrhea. Negative for vomiting.  Genitourinary: Negative for dysuria  and hematuria.  Musculoskeletal: Negative for arthralgias and back pain.  Skin: Negative for color change and rash.  Neurological: Negative for seizures and syncope.  All other systems reviewed and are negative.   Physical Exam Updated Vital Signs BP 122/73   Pulse 76   Temp 99 F (37.2 C) (Oral)   Resp 19   Ht 5' 7"  (1.702 m)   Wt 108 kg   SpO2 99%   BMI 37.28 kg/m   Physical Exam Vitals and nursing note reviewed.  Constitutional:      Appearance: He is well-developed and well-nourished. He is obese.  HENT:     Head: Normocephalic and atraumatic.  Eyes:     General: Scleral icterus (Bilaterally) present.     Conjunctiva/sclera: Conjunctivae normal.   Cardiovascular:     Rate and Rhythm: Normal rate and regular rhythm.     Heart sounds: No murmur heard.   Pulmonary:     Effort: Pulmonary effort is normal. No respiratory distress.     Breath sounds: Normal breath sounds.  Abdominal:     General: Abdomen is protuberant. There is distension.     Palpations: Abdomen is soft. There is fluid wave.     Tenderness: There is generalized abdominal tenderness and tenderness in the periumbilical area.     Comments: Abdomen significantly distended, mildly tender just under the umbilicus.  Peritoneal signs.  No flank tenderness  Musculoskeletal:        General: No edema.     Cervical back: Neck supple.  Skin:    General: Skin is warm and dry.  Neurological:     Mental Status: He is alert.  Psychiatric:        Mood and Affect: Mood and affect normal.     ED Results / Procedures / Treatments   Labs (all labs ordered are listed, but only abnormal results are displayed) Labs Reviewed  COMPREHENSIVE METABOLIC PANEL - Abnormal; Notable for the following components:      Result Value   Sodium 134 (*)    CO2 19 (*)    Glucose, Bld 117 (*)    Calcium 8.1 (*)    Total Protein 5.9 (*)    Albumin 2.0 (*)    AST 167 (*)    ALT 48 (*)    Alkaline Phosphatase 339 (*)    Total Bilirubin 10.6 (*)    All other components within normal limits  CBC - Abnormal; Notable for the following components:   RBC 3.76 (*)    Hemoglobin 12.2 (*)    HCT 35.0 (*)    RDW 16.9 (*)    Platelets 81 (*)    All other components within normal limits  URINALYSIS, ROUTINE W REFLEX MICROSCOPIC - Abnormal; Notable for the following components:   Color, Urine AMBER (*)    Hgb urine dipstick MODERATE (*)    Bilirubin Urine SMALL (*)    Bacteria, UA RARE (*)    All other components within normal limits  SARS CORONAVIRUS 2 BY RT PCR (HOSPITAL ORDER, [facility])  LIPASE, BLOOD  BILIRUBIN, DIRECT    EKG None  Radiology DG Chest  Portable 1 View  Result Date: 12/26/2020 CLINICAL DATA:  Cough EXAM: PORTABLE CHEST 1 VIEW COMPARISON:  03/22/2018 FINDINGS: The heart size and mediastinal contours are within normal limits. Mild bibasilar interstitial prominence. No focal airspace consolidation, pleural effusion, or pneumothorax. The visualized skeletal structures are unremarkable. IMPRESSION: Mild bibasilar interstitial prominence, may reflect bronchitic  type lung changes versus developing atypical/viral infection. Electronically Signed   By: Davina Poke D.O.   On: 12/26/2020 13:44    Procedures Procedures   Medications Ordered in ED Medications  phytonadione (VITAMIN K) tablet 5 mg (has no administration in time range)  fentaNYL (SUBLIMAZE) injection 25 mcg (25 mcg Intravenous Given 12/26/20 1244)    ED Course  I have reviewed the triage vital signs and the nursing notes.  Pertinent labs & imaging results that were available during my care of the patient were reviewed by me and considered in my medical decision making (see chart for details).    MDM Rules/Calculators/A&P                          55 year old male who presents to the ER with worsening abdominal pain in the setting of cirrhosis and ascites. Outpatient CT had shown possible colitis. On arrival, vitals reassuring, afebrile. He has a significantly distended abdomen, some mild abdominal tenderness inferior to the umbilicus, however no peritoneal signs. No flank tenderness. CBC without leukocytosis, hemoglobin of 12.2 today, slowly downtrending. CMP with mild hyponatremia, transaminitis consistent with history of cirrhosis, total bilirubin of 10.6 which actually appears slightly improved from prior. UA with moderate hemoglobin, small bilirubin. Covid test is negative. Chest x-ray without any acute findings.  Spoke with Nancy Marus, PA-C with GI, who will see and evaluate the patient. Plan to admit the patient for further evaluation and treatment.  Per GI,  abdominal ultrasound ordered. Patient reports no abdominal pain at this time. Overall well-appearing, stable for admission    Final Clinical Impression(s) / ED Diagnoses Final diagnoses:  Cirrhosis of liver with ascites, unspecified hepatic cirrhosis type [facility])    Rx / DC Orders ED Discharge Orders    None       Garald Balding, PA-C 12/26/20 [place], DO 12/26/20 1515

## 2020-12-27 ENCOUNTER — Inpatient Hospital Stay (HOSPITAL_COMMUNITY): Payer: No Typology Code available for payment source

## 2020-12-27 DIAGNOSIS — R609 Edema, unspecified: Secondary | ICD-10-CM

## 2020-12-27 LAB — COMPREHENSIVE METABOLIC PANEL
ALT: 42 U/L (ref 0–44)
AST: 142 U/L — ABNORMAL HIGH (ref 15–41)
Albumin: 2 g/dL — ABNORMAL LOW (ref 3.5–5.0)
Alkaline Phosphatase: 277 U/L — ABNORMAL HIGH (ref 38–126)
Anion gap: 5 (ref 5–15)
BUN: 10 mg/dL (ref 6–20)
CO2: 20 mmol/L — ABNORMAL LOW (ref 22–32)
Calcium: 8.3 mg/dL — ABNORMAL LOW (ref 8.9–10.3)
Chloride: 109 mmol/L (ref 98–111)
Creatinine, Ser: 0.81 mg/dL (ref 0.61–1.24)
GFR, Estimated: >60 mL/min (ref >60)
Glucose, Bld: 136 mg/dL — ABNORMAL HIGH (ref 70–99)
Potassium: 4 mmol/L (ref 3.5–5.1)
Sodium: 134 mmol/L — ABNORMAL LOW (ref 135–145)
Total Bilirubin: 11.7 mg/dL — ABNORMAL HIGH (ref 0.3–1.2)
Total Protein: 5.8 g/dL — ABNORMAL LOW (ref 6.5–8.1)

## 2020-12-27 LAB — CBC
HCT: 32.4 % — ABNORMAL LOW (ref 39.0–52.0)
Hemoglobin: 11.1 g/dL — ABNORMAL LOW (ref 13.0–17.0)
MCH: 31.8 pg (ref 26.0–34.0)
MCHC: 34.3 g/dL (ref 30.0–36.0)
MCV: 92.8 fL (ref 80.0–100.0)
Platelets: 71 K/uL — ABNORMAL LOW (ref 150–400)
RBC: 3.49 MIL/uL — ABNORMAL LOW (ref 4.22–5.81)
RDW: 17 % — ABNORMAL HIGH (ref 11.5–15.5)
WBC: 3.8 K/uL — ABNORMAL LOW (ref 4.0–10.5)
nRBC: 0 % (ref 0.0–0.2)

## 2020-12-27 LAB — GASTROINTESTINAL PANEL BY PCR, STOOL (REPLACES STOOL CULTURE)

## 2020-12-27 LAB — PROTIME-INR
INR: 1.7 — ABNORMAL HIGH (ref 0.8–1.2)
Prothrombin Time: 19.4 s — ABNORMAL HIGH (ref 11.4–15.2)

## 2020-12-27 MED ORDER — SODIUM CHLORIDE 0.9 % IV SOLN
2.0000 g | INTRAVENOUS | Status: DC
  Administered 2020-12-27: 2 g via INTRAVENOUS
  Filled 2020-12-27: qty 20
  Filled 2020-12-27: qty 2

## 2020-12-27 MED ORDER — LIDOCAINE HCL (PF) 1 % IJ SOLN
INTRAMUSCULAR | Status: AC
  Filled 2020-12-27: qty 30

## 2020-12-27 MED ORDER — SODIUM CHLORIDE 0.9 % IV SOLN
1.0000 g | INTRAVENOUS | Status: DC
  Filled 2020-12-27: qty 10

## 2020-12-27 NOTE — Plan of Care (Signed)

## 2020-12-27 NOTE — Progress Notes (Signed)
GI PCR + for EAEC , MD notified via secured chat.

## 2020-12-27 NOTE — Progress Notes (Signed)
VASCULAR LAB    Bilateral lower extremity venous duplex has been performed.  See CV proc for preliminary results.   Belmira Daley, RVT 12/27/2020, 2:23 PM

## 2020-12-27 NOTE — Progress Notes (Signed)
Progress Note for [facility]  Subjective: He is feeling much better today.  No further issues with diarrhea or hematochezia.  He also feels that his abdomen is less bloated.  Objective: Vital signs in last 24 hours: Temp:  [98.4 F (36.9 C)-99.4 F (37.4 C)] 98.4 F (36.9 C) (01/29 0530) Pulse Rate:  [65-96] 96 (01/29 0530) Resp:  [10-22] 18 (01/29 0530) BP: (118-143)/(68-90) 118/68 (01/29 0530) SpO2:  [98 %-100 %] 99 % (01/29 0530) Last BM Date: 12/26/20  Intake/Output from previous day: 01/28 0701 - 01/29 0700 In: 724.4 [P.O.:560; IV Piggyback:164.4] Out: 1325 [Urine:1325] Intake/Output this shift: Total I/O In: 50 [IV Piggyback:50] Out: -   General appearance: alert and no distress GI: soft, nontender, + ascites  Lab Results: Recent Labs    12/24/20 1333 12/26/20 1013 12/27/20 0046  WBC 5.2 6.2 3.8*  HGB 12.5* 12.2* 11.1*  HCT 35.8* 35.0* 32.4*  PLT 80.0* 81* 71*   BMET Recent Labs    12/24/20 1333 12/26/20 1013 12/27/20 0046  NA 135 134* 134*  K 3.8 3.8 4.0  CL 106 107 109  CO2 23 19* 20*  GLUCOSE 104* 117* 136*  BUN 12 8 10   CREATININE 0.80 0.86 0.81  CALCIUM 8.3* 8.1* 8.3*   LFT Recent Labs    12/26/20 1429 12/27/20 0046  PROT  --  5.8*  ALBUMIN  --  2.0*  AST  --  142*  ALT  --  42  ALKPHOS  --  277*  BILITOT  --  11.7*  BILIDIR 5.5*  --    PT/INR Recent Labs    12/24/20 1333 12/27/20 0046  LABPROT 19.2* 19.4*  INR 1.7* 1.7*   Hepatitis Panel No results for input(s): HEPBSAG, HCVAB, HEPAIGM, HEPBIGM in the last 72 hours. C-Diff No results for input(s): CDIFFTOX in the last 72 hours. Fecal Lactopherrin No results for input(s): FECLLACTOFRN in the last 72 hours.  Studies/Results: US Abdomen Limited  Result Date: 12/26/2020 CLINICAL DATA:  Abdominal swelling EXAM: ULTRASOUND ABDOMEN LIMITED RIGHT UPPER QUADRANT COMPARISON:  CT abdomen 12/24/2020 FINDINGS: Gallbladder: Calcified gallbladder wall versus large gallstone in the  gallbladder fossa. No significant interval change compared with the prior CT of 12/24/2020. No sonographic Murphy sign noted by sonographer. Common bile duct: Diameter: 4 mm Liver: No focal hepatic mass. Nodular contour of the liver with a heterogeneous echotexture. Portal vein is patent on color Doppler imaging with normal direction of blood flow towards the liver. Other: Abdominal ascites. IMPRESSION: 1. Calcified gallbladder wall versus large gallstone in the gallbladder fossa. No significant interval change compared with prior CT of 12/24/2020. 2. Cirrhosis. No focal hepatic mass.  Abdominal ascites. Electronically Signed   By: Kathreen Devoid   On: 12/26/2020 16:44   DG Chest Portable 1 View  Result Date: 12/26/2020 CLINICAL DATA:  Cough EXAM: PORTABLE CHEST 1 VIEW COMPARISON:  03/22/2018 FINDINGS: The heart size and mediastinal contours are within normal limits. Mild bibasilar interstitial prominence. No focal airspace consolidation, pleural effusion, or pneumothorax. The visualized skeletal structures are unremarkable. IMPRESSION: Mild bibasilar interstitial prominence, may reflect bronchitic type lung changes versus developing atypical/viral infection. Electronically Signed   By: Davina Poke D.O.   On: 12/26/2020 13:44    Medications:  Scheduled: . lidocaine (PF)      . dicyclomine  10 mg Oral TID AC & HS  . folic acid  1 mg Oral Daily  . multivitamin with minerals  1 tablet Oral Daily  . prednisoLONE  40 mg  Oral Daily  . thiamine  100 mg Oral Daily   Or  . thiamine  100 mg Intravenous Daily   Continuous: . albumin human 12.5 g (12/27/20 1159)    Assessment/Plan: 1) ETOH hepatitis - DF today is at 58. 2) ETOH cirrhosis. 3) Ascites. 4) ? Proximal colitis.   Clinically he reports feeling much better.  No complaints at this time.  He feels less distended, even though he is still pending the paracentesis.  His diarrhea is currently not an issue.  Plan: 1) Continue with  prednisolone. 2) Await paracentesis diagnostic/therapeutic. 3) Monitor for any signs or symptoms of withdrawal.  LOS: 1 day   Ayriana Wix D 12/27/2020, 12:03 PM

## 2020-12-27 NOTE — Progress Notes (Signed)
PROGRESS NOTE    Ian Hall  CBJ:[id number] DOB: October 31, 1966 DOA: 12/26/2020 PCP: Maximiano Coss, NP   Chief Complain: Abdomen pain, diarrhea  Brief Narrative: Patient is a 55 year old male with history of chronic alcohol abuse with binge drinking, liver cirrhosis who presented with complaints of abdomen pain, diarrhea.  Last drink was around Christmas.He was recently seen by gastroenterology as an outpatient for postprandial diarrhea.  No history of fever, nausea or vomiting.  Recent GI work-up showed worsening of hyper bilirubinemia, autoimmune hepatitis, malignancy work-up.  Patient was sent to the emergency room by gastroenterology for worsening hyperbilirubinemia/concern of alcoholic hepatitis.  On presentation he had elevated liver enzymes, T bili of 10.6, INR of 1.7.  CT abdomen showed cirrhosis with portal hypertension and also possible colitis.  GI following here.  Started on prednisolone  Assessment & Plan:   Active Problems:   Alcoholic hepatitis with ascites   Hepatitis, alcoholic, acute   Acute alcoholic hepatitis: Continue prednisolone.  GI following.  Continue to monitor liver enzymes.  Monitor INR Mild elevated ammonia level.  Continue Lasix, Aldactone. Also has ascites.  Will request for ultrasound-guided paracentesis, follow-up fluid analysis.  Continue albumin infusion MELD score of 24, estimated 30-monthmortality is 19.6%  Acute on chronic diarrhea: CT abdomen showed possible colitis.  Holding antibiotics for now.  GI following.  Plan for outpatient colonoscopy on 12/30/2020.  Diarrhea has slowed down.  He denies any abdomen pain, nausea or vomiting today.  Will get GI pathogen panel.  Pancytopenia: Secondary to chronic alcohol abuse/portal hypertension/splenomegaly.  Continue to monitor CBC.  Also has elevated INR.  Chronic alcohol abuse: Counseled cessation.  No signs of  Withdrawal ,started on CIWA protocol  Morbid obesity: BMI of 37.2         DVT  prophylaxis:SCD Code Status: full Family Communication: None at bedside Status is: Inpatient  Remains inpatient appropriate because:Inpatient level of care appropriate due to severity of illness   Dispo: The patient is from: Home              Anticipated d/c is to: Home              Anticipated d/c date is: 2 days              Patient currently is not medically stable to d/c.   Difficult to place patient No     Consultants: GI  Procedures:None  Antimicrobials:  Anti-infectives (From admission, onward)   None      Subjective: Patient seen and examined the bedside this morning.  Hemodynamically stable.  Looks more comfortable today.  He  says she feels better today.  No belly pain.  Had 2 loose bowel movements this morning.  Objective: Vitals:   12/26/20 1826 12/26/20 2023 12/27/20 0132 12/27/20 0530  BP: (!) 142/82 122/71 118/80 118/68  Pulse: 74 74 91 96  Resp: 18 18 20 18   Temp: 98.7 F (37.1 C) 98.6 F (37 C) 99.4 F (37.4 C) 98.4 F (36.9 C)  TempSrc: Oral Oral Oral Oral  SpO2: 100% 100% 98% 99%  Weight:      Height:        Intake/Output Summary (Last 24 hours) at 12/27/2020 0826 Last data filed at 12/27/2020 0803 Gross per 24 hour  Intake 534.44 ml  Output 1325 ml  Net -790.56 ml   Filed Weights   12/26/20 1012  Weight: 108 kg    Examination:  General exam: Appears calm and comfortable ,Not in distress,obese  HEENT:PERRL,Oral mucosa moist, Ear/Nose normal on gross exam Respiratory system: Bilateral equal air entry, normal vesicular breath sounds, no wheezes or crackles  Cardiovascular system: S1 & S2 heard, RRR. No JVD, murmurs, rubs, gallops or clicks. Gastrointestinal system: Abdomen is distended, soft and nontender. No organomegaly or masses felt. Normal bowel sounds heard. Central nervous system: Alert and oriented. No focal neurological deficits. Extremities: No edema, no clubbing ,no cyanosis Skin: No rashes, lesions or ulcers,Icterus  present     Data Reviewed: I have personally reviewed following labs and imaging studies  CBC: Recent Labs  Lab 12/22/20 1045 12/24/20 1333 12/26/20 1013 12/27/20 0046  WBC 5.5 5.2 6.2 3.8*  NEUTROABS 3.4 3.2  --   --   HGB 12.8* 12.5* 12.2* 11.1*  HCT 37.1* 35.8* 35.0* 32.4*  MCV 94.3 93.2 93.1 92.8  PLT 85.0 Repeated and verified X2.* 80.0* 81* 71*   Basic Metabolic Panel: Recent Labs  Lab 12/22/20 1045 12/24/20 1333 12/26/20 1013 12/26/20 1516 12/27/20 0046  NA 135 135 134*  --  134*  K 3.9 3.8 3.8  --  4.0  CL 106 106 107  --  109  CO2 24 23 19*  --  20*  GLUCOSE 103* 104* 117*  --  136*  BUN 12 12 8   --  10  CREATININE 0.94 0.80 0.86  --  0.81  CALCIUM 8.1* 8.3* 8.1*  --  8.3*  MG  --   --   --  1.6*  --   PHOS  --   --   --  3.7  --    GFR: Estimated Creatinine Clearance: 122.2 mL/min (by C-G formula based on SCr of 0.81 mg/dL). Liver Function Tests: Recent Labs  Lab 12/22/20 1045 12/24/20 1333 12/26/20 1013 12/27/20 0046  AST 157*  157* 138*  138* 167* 142*  ALT 44  44 39  39 48* 42  ALKPHOS 339*  339* 323*  323* 339* 277*  BILITOT 13.2*  13.2* 12.1*  12.1* 10.6* 11.7*  PROT 5.6*  5.6* 5.6*  5.6* 5.9* 5.8*  ALBUMIN 2.7*  2.7* 2.4*  2.4* 2.0* 2.0*   Recent Labs  Lab 12/26/20 1013  LIPASE 47   Recent Labs  Lab 12/26/20 1952  AMMONIA 39*   Coagulation Profile: Recent Labs  Lab 12/22/20 1045 12/24/20 1333 12/27/20 0046  INR 1.7* 1.7* 1.7*   Cardiac Enzymes: Recent Labs  Lab 12/26/20 1505  CKTOTAL 226   BNP (last 3 results) No results for input(s): PROBNP in the last 8760 hours. HbA1C: No results for input(s): HGBA1C in the last 72 hours. CBG: No results for input(s): GLUCAP in the last 168 hours. Lipid Profile: No results for input(s): CHOL, HDL, LDLCALC, TRIG, CHOLHDL, LDLDIRECT in the last 72 hours. Thyroid Function Tests: No results for input(s): TSH, T4TOTAL, FREET4, T3FREE, THYROIDAB in the last 72  hours. Anemia Panel: No results for input(s): VITAMINB12, FOLATE, FERRITIN, TIBC, IRON, RETICCTPCT in the last 72 hours. Sepsis Labs: No results for input(s): PROCALCITON, LATICACIDVEN in the last 168 hours.  Recent Results (from the past 240 hour(s))  SARS Coronavirus 2 by RT PCR (hospital order, performed in [facility] hospital lab) Nasopharyngeal Nasopharyngeal Swab     Status: None   Collection Time: 12/26/20 11:47 AM   Specimen: Nasopharyngeal Swab  Result Value Ref Range Status   SARS Coronavirus 2 NEGATIVE NEGATIVE Final    Comment: (NOTE) SARS-CoV-2 target nucleic acids are NOT DETECTED.  The SARS-CoV-2 RNA is generally detectable in  upper and lower respiratory specimens during the acute phase of infection. The lowest concentration of SARS-CoV-2 viral copies this assay can detect is 250 copies / mL. A negative result does not preclude SARS-CoV-2 infection and should not be used as the sole basis for treatment or other patient management decisions.  A negative result may occur with improper specimen collection / handling, submission of specimen other than nasopharyngeal swab, presence of viral mutation(s) within the areas targeted by this assay, and inadequate number of viral copies (<250 copies / mL). A negative result must be combined with clinical observations, patient history, and epidemiological information.  Fact Sheet for Patients:   StrictlyIdeas.no  Fact Sheet for Healthcare Providers: BankingDealers.co.za  This test is not yet approved or  cleared by the [place] FDA and has been authorized for detection and/or diagnosis of SARS-CoV-2 by FDA under an Emergency Use Authorization (EUA).  This EUA will remain in effect (meaning this test can be used) for the duration of the COVID-19 declaration under Section 564(b)(1) of the Act, 21 U.S.C. section 360bbb-3(b)(1), unless the authorization is terminated or revoked  sooner.  Performed at [facility] [address], [place], [place] [address]          Radiology Studies: US Abdomen Limited  Result Date: 12/26/2020 CLINICAL DATA:  Abdominal swelling EXAM: ULTRASOUND ABDOMEN LIMITED RIGHT UPPER QUADRANT COMPARISON:  CT abdomen 12/24/2020 FINDINGS: Gallbladder: Calcified gallbladder wall versus large gallstone in the gallbladder fossa. No significant interval change compared with the prior CT of 12/24/2020. No sonographic Murphy sign noted by sonographer. Common bile duct: Diameter: 4 mm Liver: No focal hepatic mass. Nodular contour of the liver with a heterogeneous echotexture. Portal vein is patent on color Doppler imaging with normal direction of blood flow towards the liver. Other: Abdominal ascites. IMPRESSION: 1. Calcified gallbladder wall versus large gallstone in the gallbladder fossa. No significant interval change compared with prior CT of 12/24/2020. 2. Cirrhosis. No focal hepatic mass.  Abdominal ascites. Electronically Signed   By: Kathreen Devoid   On: 12/26/2020 16:44   DG Chest Portable 1 View  Result Date: 12/26/2020 CLINICAL DATA:  Cough EXAM: PORTABLE CHEST 1 VIEW COMPARISON:  03/22/2018 FINDINGS: The heart size and mediastinal contours are within normal limits. Mild bibasilar interstitial prominence. No focal airspace consolidation, pleural effusion, or pneumothorax. The visualized skeletal structures are unremarkable. IMPRESSION: Mild bibasilar interstitial prominence, may reflect bronchitic type lung changes versus developing atypical/viral infection. Electronically Signed   By: Davina Poke D.O.   On: 12/26/2020 13:44        Scheduled Meds: . dicyclomine  10 mg Oral TID AC & HS  . folic acid  1 mg Oral Daily  . multivitamin with minerals  1 tablet Oral Daily  . prednisoLONE  40 mg Oral Daily  . thiamine  100 mg Oral Daily   Or  . thiamine  100 mg Intravenous Daily   Continuous Infusions: . albumin human 12.5 g  (12/27/20 0525)     LOS: 1 day    Time spent: 25 mins.More than 50% of that time was spent in counseling and/or coordination of care.      Shelly Coss, MD [facility] P1/29/2022, 8:26 AM

## 2020-12-28 LAB — COMPREHENSIVE METABOLIC PANEL
ALT: 39 U/L (ref 0–44)
AST: 113 U/L — ABNORMAL HIGH (ref 15–41)
Albumin: 2.6 g/dL — ABNORMAL LOW (ref 3.5–5.0)
Alkaline Phosphatase: 257 U/L — ABNORMAL HIGH (ref 38–126)
Anion gap: 7 (ref 5–15)
BUN: 11 mg/dL (ref 6–20)
CO2: 20 mmol/L — ABNORMAL LOW (ref 22–32)
Calcium: 8.4 mg/dL — ABNORMAL LOW (ref 8.9–10.3)
Chloride: 108 mmol/L (ref 98–111)
Creatinine, Ser: 0.72 mg/dL (ref 0.61–1.24)
GFR, Estimated: >60 mL/min (ref >60)
Glucose, Bld: 169 mg/dL — ABNORMAL HIGH (ref 70–99)
Potassium: 3.8 mmol/L (ref 3.5–5.1)
Sodium: 135 mmol/L (ref 135–145)
Total Bilirubin: 9.4 mg/dL — ABNORMAL HIGH (ref 0.3–1.2)
Total Protein: 5.6 g/dL — ABNORMAL LOW (ref 6.5–8.1)

## 2020-12-28 LAB — ANTI-SMOOTH MUSCLE ANTIBODY, IGG: F-Actin IgG: 34 Units — ABNORMAL HIGH (ref 0–19)

## 2020-12-28 LAB — CBC WITH DIFFERENTIAL/PLATELET
Abs Immature Granulocytes: 0.05 10*3/uL (ref 0.00–0.07)
Basophils Absolute: 0 10*3/uL (ref 0.0–0.1)
Basophils Relative: 0 %
Eosinophils Absolute: 0 10*3/uL (ref 0.0–0.5)
Eosinophils Relative: 0 %
HCT: 30.9 % — ABNORMAL LOW (ref 39.0–52.0)
Hemoglobin: 10.4 g/dL — ABNORMAL LOW (ref 13.0–17.0)
Immature Granulocytes: 1 %
Lymphocytes Relative: 15 %
Lymphs Abs: 1.1 10*3/uL (ref 0.7–4.0)
MCH: 31.8 pg (ref 26.0–34.0)
MCHC: 33.7 g/dL (ref 30.0–36.0)
MCV: 94.5 fL (ref 80.0–100.0)
Monocytes Absolute: 0.5 10*3/uL (ref 0.1–1.0)
Monocytes Relative: 6 %
Neutro Abs: 5.7 10*3/uL (ref 1.7–7.7)
Neutrophils Relative %: 78 %
Platelets: 63 10*3/uL — ABNORMAL LOW (ref 150–400)
RBC: 3.27 MIL/uL — ABNORMAL LOW (ref 4.22–5.81)
RDW: 17.3 % — ABNORMAL HIGH (ref 11.5–15.5)
WBC: 7.3 10*3/uL (ref 4.0–10.5)
nRBC: 0 % (ref 0.0–0.2)

## 2020-12-28 MED ORDER — CIPROFLOXACIN HCL 500 MG PO TABS
500.0000 mg | ORAL_TABLET | Freq: Two times a day (BID) | ORAL | Status: DC
  Administered 2020-12-28 – 2020-12-29 (×3): 500 mg via ORAL
  Filled 2020-12-28 (×3): qty 1

## 2020-12-28 NOTE — Progress Notes (Signed)
PROGRESS NOTE    Ian Hall  JOI:[id number] DOB: 28-Feb-1966 DOA: 12/26/2020 PCP: Maximiano Coss, NP   Chief Complain: Abdomen pain, diarrhea  Brief Narrative: Patient is a 55 year old male with history of chronic alcohol abuse with binge drinking, liver cirrhosis who presented with complaints of abdomen pain, diarrhea.  Last drink was around Christmas.He was recently seen by gastroenterology as an outpatient for postprandial diarrhea.  No history of fever, nausea or vomiting.  Recent GI work-up showed worsening of hyper bilirubinemia, autoimmune hepatitis, malignancy work-up.  Patient was sent to the emergency room by gastroenterology for worsening hyperbilirubinemia/concern of alcoholic hepatitis.  On presentation he had elevated liver enzymes, T bili of 10.6, INR of 1.7.  CT abdomen showed cirrhosis with portal hypertension and also possible colitis.  GI following here.  Started on prednisolone  Assessment & Plan:   Active Problems:   Alcoholic hepatitis with ascites   Hepatitis, alcoholic, acute   Acute alcoholic hepatitis: Continue prednisolone.  GI following.  Continue to monitor liver enzymes.  Monitor INR. Numbers are improving Mild elevated ammonia level.   He was found to have ascites.  requested for ultrasound-guided paracentesis, but not done due to presence of insufficient fluid.  Given albumin infusion MELD score of 24, estimated 9-monthmortality is 19.6% he will be discharged on prednisone when he is ready. Prednisolone could be very expensive.  Acute on chronic diarrhea: CT abdomen showed possible colitis.  GI pathogen panel showed enteroaggregative E. coli.  Started on cipro.Plan for outpatient colonoscopy on 12/30/2020.  Diarrhea has slowed down.  He denies any abdomen pain, nausea or vomiting today.   Pancytopenia: Secondary to chronic alcohol abuse/portal hypertension/splenomegaly.  Continue to monitor CBC.  Also has elevated INR.  Chronic alcohol abuse: Counseled  cessation.  No signs of  Withdrawal ,started on CIWA protocol  Morbid obesity: BMI of 37.2         DVT prophylaxis:SCD Code Status: full Family Communication: None at bedside Status is: Inpatient  Remains inpatient appropriate because:Inpatient level of care appropriate due to severity of illness   Dispo: The patient is from: Home              Anticipated d/c is to: Home              Anticipated d/c date is: 2 days              Patient currently is not medically stable to d/c.   Difficult to place patient No     Consultants: GI  Procedures:None  Antimicrobials:  Anti-infectives (From admission, onward)   Start     Dose/Rate Route Frequency Ordered Stop   12/27/20 1600  cefTRIAXone (ROCEPHIN) 1 g in sodium chloride 0.9 % 100 mL IVPB  Status:  Discontinued        1 g 200 mL/hr over 30 Minutes Intravenous Every 24 hours 12/27/20 1510 12/27/20 1513   12/27/20 1600  cefTRIAXone (ROCEPHIN) 2 g in sodium chloride 0.9 % 100 mL IVPB        2 g 200 mL/hr over 30 Minutes Intravenous Every 24 hours 12/27/20 1513        Subjective: Patient seen and examined the bedside this morning. Hemodynamically stable. Looks  comfortable today. But complained of 2 episodes of diarrhea today denies any nausea vomiting or abdominal pain.  Objective: Vitals:   12/27/20 0132 12/27/20 0530 12/27/20 1439 12/27/20 2004  BP: 118/80 118/68 129/75 (!) 115/58  Pulse: 91 96 100 89  Resp:  20 18 18 18   Temp: 99.4 F (37.4 C) 98.4 F (36.9 C) 98.3 F (36.8 C) 98.8 F (37.1 C)  TempSrc: Oral Oral Oral Oral  SpO2: 98% 99% 98% 97%  Weight:      Height:       No intake or output data in the 24 hours ending 12/28/20 0857 Filed Weights   12/26/20 1012  Weight: 108 kg    Examination:   General exam: Comfortable, obese HEENT:PERRL,Oral mucosa moist, Ear/Nose normal on gross exam Respiratory system: Bilateral equal air entry, normal vesicular breath sounds, no wheezes or crackles   Cardiovascular system: S1 & S2 heard, RRR. No JVD, murmurs, rubs, gallops or clicks. Gastrointestinal system: Abdomen is distended, soft and nontender. No organomegaly or masses felt. Normal bowel sounds heard. Central nervous system: Alert and oriented. No focal neurological deficits. Extremities: No edema, no clubbing ,no cyanosis Skin: No rashes, lesions or ulcers,no icterus ,no pallor      Data Reviewed: I have personally reviewed following labs and imaging studies  CBC: Recent Labs  Lab 12/22/20 1045 12/24/20 1333 12/26/20 1013 12/27/20 0046  WBC 5.5 5.2 6.2 3.8*  NEUTROABS 3.4 3.2  --   --   HGB 12.8* 12.5* 12.2* 11.1*  HCT 37.1* 35.8* 35.0* 32.4*  MCV 94.3 93.2 93.1 92.8  PLT 85.0 Repeated and verified X2.* 80.0* 81* 71*   Basic Metabolic Panel: Recent Labs  Lab 12/22/20 1045 12/24/20 1333 12/26/20 1013 12/26/20 1516 12/27/20 0046  NA 135 135 134*  --  134*  K 3.9 3.8 3.8  --  4.0  CL 106 106 107  --  109  CO2 24 23 19*  --  20*  GLUCOSE 103* 104* 117*  --  136*  BUN 12 12 8   --  10  CREATININE 0.94 0.80 0.86  --  0.81  CALCIUM 8.1* 8.3* 8.1*  --  8.3*  MG  --   --   --  1.6*  --   PHOS  --   --   --  3.7  --    GFR: Estimated Creatinine Clearance: 122.2 mL/min (by C-G formula based on SCr of 0.81 mg/dL). Liver Function Tests: Recent Labs  Lab 12/22/20 1045 12/24/20 1333 12/26/20 1013 12/27/20 0046  AST 157*  157* 138*  138* 167* 142*  ALT 44  44 39  39 48* 42  ALKPHOS 339*  339* 323*  323* 339* 277*  BILITOT 13.2*  13.2* 12.1*  12.1* 10.6* 11.7*  PROT 5.6*  5.6* 5.6*  5.6* 5.9* 5.8*  ALBUMIN 2.7*  2.7* 2.4*  2.4* 2.0* 2.0*   Recent Labs  Lab 12/26/20 1013  LIPASE 47   Recent Labs  Lab 12/26/20 1952  AMMONIA 39*   Coagulation Profile: Recent Labs  Lab 12/22/20 1045 12/24/20 1333 12/27/20 0046  INR 1.7* 1.7* 1.7*   Cardiac Enzymes: Recent Labs  Lab 12/26/20 1505  CKTOTAL 226   BNP (last 3 results) No results for  input(s): PROBNP in the last 8760 hours. HbA1C: No results for input(s): HGBA1C in the last 72 hours. CBG: No results for input(s): GLUCAP in the last 168 hours. Lipid Profile: No results for input(s): CHOL, HDL, LDLCALC, TRIG, CHOLHDL, LDLDIRECT in the last 72 hours. Thyroid Function Tests: No results for input(s): TSH, T4TOTAL, FREET4, T3FREE, THYROIDAB in the last 72 hours. Anemia Panel: No results for input(s): VITAMINB12, FOLATE, FERRITIN, TIBC, IRON, RETICCTPCT in the last 72 hours. Sepsis Labs: No results for input(s): PROCALCITON, LATICACIDVEN  in the last 168 hours.  Recent Results (from the past 240 hour(s))  SARS Coronavirus 2 by RT PCR (hospital order, performed in [facility] hospital lab) Nasopharyngeal Nasopharyngeal Swab     Status: None   Collection Time: 12/26/20 11:47 AM   Specimen: Nasopharyngeal Swab  Result Value Ref Range Status   SARS Coronavirus 2 NEGATIVE NEGATIVE Final    Comment: (NOTE) SARS-CoV-2 target nucleic acids are NOT DETECTED.  The SARS-CoV-2 RNA is generally detectable in upper and lower respiratory specimens during the acute phase of infection. The lowest concentration of SARS-CoV-2 viral copies this assay can detect is 250 copies / mL. A negative result does not preclude SARS-CoV-2 infection and should not be used as the sole basis for treatment or other patient management decisions.  A negative result may occur with improper specimen collection / handling, submission of specimen other than nasopharyngeal swab, presence of viral mutation(s) within the areas targeted by this assay, and inadequate number of viral copies (<250 copies / mL). A negative result must be combined with clinical observations, patient history, and epidemiological information.  Fact Sheet for Patients:   StrictlyIdeas.no  Fact Sheet for Healthcare Providers: BankingDealers.co.za  This test is not yet approved or  cleared  by the [place] FDA and has been authorized for detection and/or diagnosis of SARS-CoV-2 by FDA under an Emergency Use Authorization (EUA).  This EUA will remain in effect (meaning this test can be used) for the duration of the COVID-19 declaration under Section 564(b)(1) of the Act, 21 U.S.C. section 360bbb-3(b)(1), unless the authorization is terminated or revoked sooner.  Performed at [facility], [place] [address], [place], [place] [address]   Gastrointestinal Panel by PCR , Stool     Status: Abnormal   Collection Time: 12/27/20  1:20 AM   Specimen: Stool  Result Value Ref Range Status   Campylobacter species NOT DETECTED NOT DETECTED Final   Plesimonas shigelloides NOT DETECTED NOT DETECTED Final   Salmonella species NOT DETECTED NOT DETECTED Final   Yersinia enterocolitica NOT DETECTED NOT DETECTED Final   Vibrio species NOT DETECTED NOT DETECTED Final   Vibrio cholerae NOT DETECTED NOT DETECTED Final   Enteroaggregative E coli (EAEC) DETECTED (A) NOT DETECTED Final    Comment: RESULT CALLED TO, READ BACK BY AND VERIFIED WITH:  JOANNE HAMZE AT 7867 12/27/20 SDR    Enteropathogenic E coli (EPEC) NOT DETECTED NOT DETECTED Final   Enterotoxigenic E coli (ETEC) NOT DETECTED NOT DETECTED Final   Shiga like toxin producing E coli (STEC) NOT DETECTED NOT DETECTED Final   Shigella/Enteroinvasive E coli (EIEC) NOT DETECTED NOT DETECTED Final   Cryptosporidium NOT DETECTED NOT DETECTED Final   Cyclospora cayetanensis NOT DETECTED NOT DETECTED Final   Entamoeba histolytica NOT DETECTED NOT DETECTED Final   Giardia lamblia NOT DETECTED NOT DETECTED Final   Adenovirus F40/41 NOT DETECTED NOT DETECTED Final   Astrovirus NOT DETECTED NOT DETECTED Final   Norovirus GI/GII NOT DETECTED NOT DETECTED Final   Rotavirus A NOT DETECTED NOT DETECTED Final   Sapovirus (I, II, IV, and V) NOT DETECTED NOT DETECTED Final    Comment: Performed at [facility], [address],  [place], [place] [address]         Radiology Studies: US Abdomen Limited  Result Date: 12/26/2020 CLINICAL DATA:  Abdominal swelling EXAM: ULTRASOUND ABDOMEN LIMITED RIGHT UPPER QUADRANT COMPARISON:  CT abdomen 12/24/2020 FINDINGS: Gallbladder: Calcified gallbladder wall versus large gallstone in the gallbladder fossa.  No significant interval change compared with the prior CT of 12/24/2020. No sonographic Murphy sign noted by sonographer. Common bile duct: Diameter: 4 mm Liver: No focal hepatic mass. Nodular contour of the liver with a heterogeneous echotexture. Portal vein is patent on color Doppler imaging with normal direction of blood flow towards the liver. Other: Abdominal ascites. IMPRESSION: 1. Calcified gallbladder wall versus large gallstone in the gallbladder fossa. No significant interval change compared with prior CT of 12/24/2020. 2. Cirrhosis. No focal hepatic mass.  Abdominal ascites. Electronically Signed   By: Kathreen Devoid   On: 12/26/2020 16:44   DG Chest Portable 1 View  Result Date: 12/26/2020 CLINICAL DATA:  Cough EXAM: PORTABLE CHEST 1 VIEW COMPARISON:  03/22/2018 FINDINGS: The heart size and mediastinal contours are within normal limits. Mild bibasilar interstitial prominence. No focal airspace consolidation, pleural effusion, or pneumothorax. The visualized skeletal structures are unremarkable. IMPRESSION: Mild bibasilar interstitial prominence, may reflect bronchitic type lung changes versus developing atypical/viral infection. Electronically Signed   By: Davina Poke D.O.   On: 12/26/2020 13:44   [place] ASCITES (ABDOMEN LIMITED)  Result Date: 12/27/2020 CLINICAL DATA:  55 year old male with ascites presenting for possible paracentesis. Recently started on Lasix. EXAM: LIMITED ABDOMEN ULTRASOUND FOR ASCITES TECHNIQUE: Limited ultrasound survey for ascites was performed in all four abdominal quadrants. COMPARISON:  Abdominal ultrasound 12/26/2020 FINDINGS: Marked interval  reduction in the volume of ascites. Only trace ascites remains and is insufficient for paracentesis. IMPRESSION: Marked interval reduction in the volume of ascites. The small volume residual ascites is insufficient for paracentesis. Electronically Signed   By: Jacqulynn Cadet M.D.   On: 12/27/2020 12:44   VAS [place] LOWER EXTREMITY VENOUS (DVT)  Result Date: 12/27/2020  Lower Venous DVT Study Indications: Edema.  Risk Factors: Liver cirrhosis. Limitations: Edema. Comparison Study: No prior study Performing Technologist: Sharion Dove RVS  Examination Guidelines: A complete evaluation includes B-mode imaging, spectral Doppler, color Doppler, and power Doppler as needed of all accessible portions of each vessel. Bilateral testing is considered an integral part of a complete examination. Limited examinations for reoccurring indications may be performed as noted. The reflux portion of the exam is performed with the patient in reverse Trendelenburg.  +---------+---------------+---------+-----------+----------+--------------+ RIGHT    CompressibilityPhasicitySpontaneityPropertiesThrombus Aging +---------+---------------+---------+-----------+----------+--------------+ CFV      Full           Yes      Yes                                 +---------+---------------+---------+-----------+----------+--------------+ SFJ      Full                                                        +---------+---------------+---------+-----------+----------+--------------+ FV Prox  Full                                                        +---------+---------------+---------+-----------+----------+--------------+ FV Mid   Full                                                        +---------+---------------+---------+-----------+----------+--------------+  FV DistalFull                                                         +---------+---------------+---------+-----------+----------+--------------+ PFV      Full                                                        +---------+---------------+---------+-----------+----------+--------------+ POP      Full           Yes      Yes                                 +---------+---------------+---------+-----------+----------+--------------+ PTV      Full                                                        +---------+---------------+---------+-----------+----------+--------------+ PERO     Full                                                        +---------+---------------+---------+-----------+----------+--------------+   +---------+---------------+---------+-----------+----------+--------------+ LEFT     CompressibilityPhasicitySpontaneityPropertiesThrombus Aging +---------+---------------+---------+-----------+----------+--------------+ CFV      Full           Yes      Yes                                 +---------+---------------+---------+-----------+----------+--------------+ SFJ      Full                                                        +---------+---------------+---------+-----------+----------+--------------+ FV Prox  Full                                                        +---------+---------------+---------+-----------+----------+--------------+ FV Mid   Full                                                        +---------+---------------+---------+-----------+----------+--------------+ FV DistalFull                                                        +---------+---------------+---------+-----------+----------+--------------+  PFV      Full                                                        +---------+---------------+---------+-----------+----------+--------------+ POP      Full           Yes      Yes                                  +---------+---------------+---------+-----------+----------+--------------+ PTV      Full                                                        +---------+---------------+---------+-----------+----------+--------------+ PERO     Full                                                        +---------+---------------+---------+-----------+----------+--------------+     Summary: BILATERAL: - No evidence of deep vein thrombosis seen in the lower extremities, bilaterally. -   *See table(s) above for measurements and observations. Electronically signed by Jamelle Haring on 12/27/2020 at 2:45:24 PM.    Final         Scheduled Meds: . dicyclomine  10 mg Oral TID AC & HS  . folic acid  1 mg Oral Daily  . multivitamin with minerals  1 tablet Oral Daily  . prednisoLONE  40 mg Oral Daily  . thiamine  100 mg Oral Daily   Or  . thiamine  100 mg Intravenous Daily   Continuous Infusions: . albumin human 12.5 g (12/28/20 0525)  . cefTRIAXone (ROCEPHIN)  IV 2 g (12/27/20 1645)     LOS: 2 days    Time spent: 25 mins.More than 50% of that time was spent in counseling and/or coordination of care.      Shelly Coss, MD [facility] P1/30/2022, 8:57 AM

## 2020-12-28 NOTE — Plan of Care (Signed)

## 2020-12-28 NOTE — Progress Notes (Addendum)
Subjective: No complaints.  Feeling well, but he did have diarrhea this AM.  Objective: Vital signs in last 24 hours: Temp:  [98.3 F (36.8 C)-98.8 F (37.1 C)] 98.8 F (37.1 C) (01/29 2004) Pulse Rate:  [89-100] 89 (01/29 2004) Resp:  [18] 18 (01/29 2004) BP: (115-129)/(58-75) 115/58 (01/29 2004) SpO2:  [97 %-98 %] 97 % (01/29 2004) Last BM Date: 12/27/20  Intake/Output from previous day: 01/29 0701 - 01/30 0700 In: 50 [IV Piggyback:50] Out: -  Intake/Output this shift: No intake/output data recorded.  General appearance: alert and no distress GI: soft, non-tender; bowel sounds normal; no masses,  no organomegaly  Lab Results: Recent Labs    12/26/20 1013 12/27/20 0046  WBC 6.2 3.8*  HGB 12.2* 11.1*  HCT 35.0* 32.4*  PLT 81* 71*   BMET Recent Labs    12/26/20 1013 12/27/20 0046  NA 134* 134*  K 3.8 4.0  CL 107 109  CO2 19* 20*  GLUCOSE 117* 136*  BUN 8 10  CREATININE 0.86 0.81  CALCIUM 8.1* 8.3*   LFT Recent Labs    12/26/20 1429 12/27/20 0046  PROT  --  5.8*  ALBUMIN  --  2.0*  AST  --  142*  ALT  --  42  ALKPHOS  --  277*  BILITOT  --  11.7*  BILIDIR 5.5*  --    PT/INR Recent Labs    12/27/20 0046  LABPROT 19.4*  INR 1.7*   Hepatitis Panel No results for input(s): HEPBSAG, HCVAB, HEPAIGM, HEPBIGM in the last 72 hours. C-Diff No results for input(s): CDIFFTOX in the last 72 hours. Fecal Lactopherrin No results for input(s): FECLLACTOFRN in the last 72 hours.  Studies/Results: US Abdomen Limited  Result Date: 12/26/2020 CLINICAL DATA:  Abdominal swelling EXAM: ULTRASOUND ABDOMEN LIMITED RIGHT UPPER QUADRANT COMPARISON:  CT abdomen 12/24/2020 FINDINGS: Gallbladder: Calcified gallbladder wall versus large gallstone in the gallbladder fossa. No significant interval change compared with the prior CT of 12/24/2020. No sonographic Murphy sign noted by sonographer. Common bile duct: Diameter: 4 mm Liver: No focal hepatic mass. Nodular contour  of the liver with a heterogeneous echotexture. Portal vein is patent on color Doppler imaging with normal direction of blood flow towards the liver. Other: Abdominal ascites. IMPRESSION: 1. Calcified gallbladder wall versus large gallstone in the gallbladder fossa. No significant interval change compared with prior CT of 12/24/2020. 2. Cirrhosis. No focal hepatic mass.  Abdominal ascites. Electronically Signed   By: Kathreen Devoid   On: 12/26/2020 16:44   DG Chest Portable 1 View  Result Date: 12/26/2020 CLINICAL DATA:  Cough EXAM: PORTABLE CHEST 1 VIEW COMPARISON:  03/22/2018 FINDINGS: The heart size and mediastinal contours are within normal limits. Mild bibasilar interstitial prominence. No focal airspace consolidation, pleural effusion, or pneumothorax. The visualized skeletal structures are unremarkable. IMPRESSION: Mild bibasilar interstitial prominence, may reflect bronchitic type lung changes versus developing atypical/viral infection. Electronically Signed   By: Davina Poke D.O.   On: 12/26/2020 13:44   [place] ASCITES (ABDOMEN LIMITED)  Result Date: 12/27/2020 CLINICAL DATA:  55 year old male with ascites presenting for possible paracentesis. Recently started on Lasix. EXAM: LIMITED ABDOMEN ULTRASOUND FOR ASCITES TECHNIQUE: Limited ultrasound survey for ascites was performed in all four abdominal quadrants. COMPARISON:  Abdominal ultrasound 12/26/2020 FINDINGS: Marked interval reduction in the volume of ascites. Only trace ascites remains and is insufficient for paracentesis. IMPRESSION: Marked interval reduction in the volume of ascites. The small volume residual ascites is insufficient for paracentesis. Electronically Signed  By: Jacqulynn Cadet M.D.   On: 12/27/2020 12:44   VAS [place] LOWER EXTREMITY VENOUS (DVT)  Result Date: 12/27/2020  Lower Venous DVT Study Indications: Edema.  Risk Factors: Liver cirrhosis. Limitations: Edema. Comparison Study: No prior study Performing Technologist:  Sharion Dove RVS  Examination Guidelines: A complete evaluation includes B-mode imaging, spectral Doppler, color Doppler, and power Doppler as needed of all accessible portions of each vessel. Bilateral testing is considered an integral part of a complete examination. Limited examinations for reoccurring indications may be performed as noted. The reflux portion of the exam is performed with the patient in reverse Trendelenburg.  +---------+---------------+---------+-----------+----------+--------------+ RIGHT    CompressibilityPhasicitySpontaneityPropertiesThrombus Aging +---------+---------------+---------+-----------+----------+--------------+ CFV      Full           Yes      Yes                                 +---------+---------------+---------+-----------+----------+--------------+ SFJ      Full                                                        +---------+---------------+---------+-----------+----------+--------------+ FV Prox  Full                                                        +---------+---------------+---------+-----------+----------+--------------+ FV Mid   Full                                                        +---------+---------------+---------+-----------+----------+--------------+ FV DistalFull                                                        +---------+---------------+---------+-----------+----------+--------------+ PFV      Full                                                        +---------+---------------+---------+-----------+----------+--------------+ POP      Full           Yes      Yes                                 +---------+---------------+---------+-----------+----------+--------------+ PTV      Full                                                        +---------+---------------+---------+-----------+----------+--------------+ PERO  Full                                                         +---------+---------------+---------+-----------+----------+--------------+   +---------+---------------+---------+-----------+----------+--------------+ LEFT     CompressibilityPhasicitySpontaneityPropertiesThrombus Aging +---------+---------------+---------+-----------+----------+--------------+ CFV      Full           Yes      Yes                                 +---------+---------------+---------+-----------+----------+--------------+ SFJ      Full                                                        +---------+---------------+---------+-----------+----------+--------------+ FV Prox  Full                                                        +---------+---------------+---------+-----------+----------+--------------+ FV Mid   Full                                                        +---------+---------------+---------+-----------+----------+--------------+ FV DistalFull                                                        +---------+---------------+---------+-----------+----------+--------------+ PFV      Full                                                        +---------+---------------+---------+-----------+----------+--------------+ POP      Full           Yes      Yes                                 +---------+---------------+---------+-----------+----------+--------------+ PTV      Full                                                        +---------+---------------+---------+-----------+----------+--------------+ PERO     Full                                                        +---------+---------------+---------+-----------+----------+--------------+  Summary: BILATERAL: - No evidence of deep vein thrombosis seen in the lower extremities, bilaterally. -   *See table(s) above for measurements and observations. Electronically signed by Jamelle Haring on 12/27/2020 at 2:45:24 PM.    Final     Medications:   Scheduled: . dicyclomine  10 mg Oral TID AC & HS  . folic acid  1 mg Oral Daily  . multivitamin with minerals  1 tablet Oral Daily  . prednisoLONE  40 mg Oral Daily  . thiamine  100 mg Oral Daily   Or  . thiamine  100 mg Intravenous Daily   Continuous: . albumin human 12.5 g (12/28/20 0525)  . cefTRIAXone (ROCEPHIN)  IV 2 g (12/27/20 1645)    Assessment/Plan: 1) ETOH hepatitis. 2) ETOH cirrhosis. 3) EAEC.   He had a bout of diarrhea at the time of rounding, but he states that it is not as severe as it was at the time of admission.  His paracentesis was not performed as he only had trace ascites.  Clinically he feels well.  His blood work is pending for this AM.  Plan: 1) Continue with prednisolone.  Upon discharge he can be provided with prednisone 40 mg QD.  Total treatment time for steroids is 28 days. 2) Follow up with Dr. Tarri Glenn for further management of his steroids. 3) No treatment necessary for his EAEC, typically, however, with the ETOH hepatitis and treatment with steroids, further antibiotic coverage with Cipro 500 mg BID is recommended.  EAEC does respond to Cipro.  Total antibiotic treatment time is 3-5 days. 4) Discontinue albumin. 5) ? D/C home later today or tomorrow AM.  LOS: 2 days   Bodhi Moradi D 12/28/2020, 9:39 AM

## 2020-12-28 NOTE — Plan of Care (Signed)
  Problem: Health Behavior/Discharge Planning: Goal: Ability to manage health-related needs will improve Outcome: Progressing   

## 2020-12-29 ENCOUNTER — Telehealth: Payer: Self-pay

## 2020-12-29 ENCOUNTER — Telehealth: Payer: Self-pay | Admitting: Gastroenterology

## 2020-12-29 LAB — COMPREHENSIVE METABOLIC PANEL
ALT: 41 U/L (ref 0–44)
AST: 122 U/L — ABNORMAL HIGH (ref 15–41)
Albumin: 2.4 g/dL — ABNORMAL LOW (ref 3.5–5.0)
Alkaline Phosphatase: 230 U/L — ABNORMAL HIGH (ref 38–126)
Anion gap: 5 (ref 5–15)
BUN: 12 mg/dL (ref 6–20)
CO2: 21 mmol/L — ABNORMAL LOW (ref 22–32)
Calcium: 8.4 mg/dL — ABNORMAL LOW (ref 8.9–10.3)
Chloride: 112 mmol/L — ABNORMAL HIGH (ref 98–111)
Creatinine, Ser: 0.81 mg/dL (ref 0.61–1.24)
GFR, Estimated: >60 mL/min (ref >60)
Glucose, Bld: 124 mg/dL — ABNORMAL HIGH (ref 70–99)
Potassium: 4.4 mmol/L (ref 3.5–5.1)
Sodium: 138 mmol/L (ref 135–145)
Total Bilirubin: 8.7 mg/dL — ABNORMAL HIGH (ref 0.3–1.2)
Total Protein: 5.5 g/dL — ABNORMAL LOW (ref 6.5–8.1)

## 2020-12-29 LAB — CBC WITH DIFFERENTIAL/PLATELET
Abs Immature Granulocytes: 0.05 10*3/uL (ref 0.00–0.07)
Basophils Absolute: 0 10*3/uL (ref 0.0–0.1)
Basophils Relative: 0 %
Eosinophils Absolute: 0 10*3/uL (ref 0.0–0.5)
Eosinophils Relative: 0 %
HCT: 31.2 % — ABNORMAL LOW (ref 39.0–52.0)
Hemoglobin: 10.3 g/dL — ABNORMAL LOW (ref 13.0–17.0)
Immature Granulocytes: 1 %
Lymphocytes Relative: 15 %
Lymphs Abs: 0.9 10*3/uL (ref 0.7–4.0)
MCH: 31.5 pg (ref 26.0–34.0)
MCHC: 33 g/dL (ref 30.0–36.0)
MCV: 95.4 fL (ref 80.0–100.0)
Monocytes Absolute: 0.5 10*3/uL (ref 0.1–1.0)
Monocytes Relative: 8 %
Neutro Abs: 4.6 10*3/uL (ref 1.7–7.7)
Neutrophils Relative %: 76 %
Platelets: 64 10*3/uL — ABNORMAL LOW (ref 150–400)
RBC: 3.27 MIL/uL — ABNORMAL LOW (ref 4.22–5.81)
RDW: 17.9 % — ABNORMAL HIGH (ref 11.5–15.5)
WBC: 6 10*3/uL (ref 4.0–10.5)
nRBC: 0 % (ref 0.0–0.2)

## 2020-12-29 MED ORDER — CIPROFLOXACIN HCL 500 MG PO TABS: 500.0000 mg | ORAL_TABLET | Freq: Two times a day (BID) | ORAL | 0 refills | Status: AC

## 2020-12-29 MED ORDER — PREDNISONE 20 MG PO TABS: 40.0000 mg | ORAL_TABLET | Freq: Every day | ORAL | 0 refills | Status: DC

## 2020-12-29 MED ORDER — THIAMINE HCL 100 MG PO TABS: 100.0000 mg | ORAL_TABLET | Freq: Every day | ORAL | 1 refills | Status: DC

## 2020-12-29 MED ORDER — FOLIC ACID 1 MG PO TABS: 1.0000 mg | ORAL_TABLET | Freq: Every day | ORAL | 1 refills | Status: DC

## 2020-12-29 NOTE — Telephone Encounter (Signed)
Spoke with pt and he is aware. Prep instructions and additional instructions sent to pt via mychart per his request.

## 2020-12-29 NOTE — Plan of Care (Signed)

## 2020-12-29 NOTE — Discharge Summary (Signed)
Physician Discharge Summary  Ian Hall NWG:[id number] DOB: 07-23-1966 DOA: 12/26/2020  PCP: Maximiano Coss, NP  Admit date: 12/26/2020 Discharge date: 12/29/2020  Admitted From: Home Disposition:  Home  Discharge Condition:Stable CODE STATUS:FULL Diet recommendation: Low sodium diet  Brief/Interim Summary:  Patient is a 55 year old male with history of chronic alcohol abuse with binge drinking, liver cirrhosis who presented with complaints of abdomen pain, diarrhea.  Last drink was around Christmas.He was recently seen by gastroenterology as an outpatient for postprandial diarrhea.  No history of fever, nausea or vomiting.  Recent GI work-up showed worsening of hyper bilirubinemia, autoimmune hepatitis, malignancy work-up.  Patient was sent to the emergency room by gastroenterology for worsening hyperbilirubinemia/concern of alcoholic hepatitis.  On presentation he had elevated liver enzymes, T bili of 10.6, INR of 1.7.  CT abdomen showed cirrhosis with portal hypertension and also possible colitis.  GI following here.  Started on prednisolone.  His diarrhea and abdominal pain have resolved.  GI pathogen panel showed enteroaggregate of E. coli, started on ciprofloxacin.  He feels great today.  He is medically stable for discharge.  GI recommended him to be discharged on prednisone for total of 28 days duration.  He will follow-up with GI as an outpatient.  Following problems were addressed during his hospitalization:   Acute alcoholic hepatitis: He was treated with  Prednisolone her for 4 days,will dc him on prednisone for next 24 days.  GI were following.  Liver enzymes have been stable. Mild elevated ammonia level.   He was suspected  to have ascites.  requested for ultrasound-guided paracentesis, but not done due to presence of insufficient fluid.  Given albumin infusion MELD score of 24, estimated 82-monthmortality is 19.6% He will follow-up with GI as an outpatient.  We have  recommended to check CMP in a week.  He has been counseled to stop alcohol consumption.  Acute on chronic diarrhea: CT abdomen showed possible colitis.  GI pathogen panel showed enteroaggregative E. coli.  Started on cipro, will continue for total of 5 days course.Plan for outpatient colonoscopy on 12/30/2020.  Diarrhea has stopped.  He denies any abdomen pain, nausea or vomiting today.   Pancytopenia: Secondary to chronic alcohol abuse/portal hypertension/splenomegaly.  Continue to monitor CBC.  Also has elevated INR.  Chronic alcohol abuse: Counseled cessation.  No signs of  Withdrawal  Morbid obesity: BMI of 37.2  Discharge Diagnoses:  Active Problems:   Alcoholic hepatitis with ascites   Hepatitis, alcoholic, acute    Discharge Instructions  Discharge Instructions    Diet - low sodium heart healthy   Complete by: As directed    Discharge instructions   Complete by: As directed    1)Please take prescribed medications as instructed. 2)Follow up with your PCP in a week.  Do a CBC, CMP test during the follow-up 3)Quit alcohol 4)Follow up with your gastroenterologist in 2 weeks.   Increase activity slowly   Complete by: As directed      Allergies as of 12/29/2020      Reactions   Hydrocodone Itching   Noted with hydrocodone cough syrup. Itching only, no hives.  02/03/16: denied itching with recent hydrocodone cough syrup. Only one episode of itching previously.       Medication List    STOP taking these medications   prednisoLONE 10 MG disintegrating tablet Commonly known as: ORAPRED ODT     TAKE these medications   ciprofloxacin 500 MG tablet Commonly known as: Cipro Take 1 tablet (500  mg total) by mouth 2 (two) times daily for 2 days. Start taking on: December 30, 2020   dicyclomine 10 MG capsule Commonly known as: BENTYL Take 1 capsule (10 mg total) by mouth 4 (four) times daily -  before meals and at bedtime.   folic acid 1 MG tablet Commonly known as:  FOLVITE Take 1 tablet (1 mg total) by mouth daily.   Metamucil 28.3 % Powd Generic drug: Psyllium Take 1 Dose by mouth 2 (two) times daily. What changed:   when to take this  additional instructions   predniSONE 20 MG tablet Commonly known as: DELTASONE Take 2 tablets (40 mg total) by mouth daily for 24 days. Start taking on: December 30, 2020   thiamine 100 MG tablet Take 1 tablet (100 mg total) by mouth daily.       Follow-up Information    Maximiano Coss, NP. Schedule an appointment as soon as possible for a visit in 1 week(s).   Specialty: Adult Health Nurse Practitioner Contact information: [place] [address] [phone]              Allergies  Allergen Reactions  . Hydrocodone Itching    Noted with hydrocodone cough syrup. Itching only, no hives.  02/03/16: denied itching with recent hydrocodone cough syrup. Only one episode of itching previously.     Consultations:  GI   Procedures/Studies: CT Abdomen Pelvis W Contrast  Addendum Date: 12/24/2020   ADDENDUM REPORT: 12/24/2020 17:19 ADDENDUM: These results were called by telephone at the time of interpretation on 12/24/2020 at 5:19 pm to provider Dr. Lucio Edward, Who verbally acknowledged these results. Electronically Signed   By: Markus Daft M.D.   On: 12/24/2020 17:19   Result Date: 12/24/2020 CLINICAL DATA:  55 year old with acute abdominal pain. Bloody stools. Blood in urine. Weight loss. Thrombocytopenia. EXAM: CT ABDOMEN AND PELVIS WITH CONTRAST TECHNIQUE: Multidetector CT imaging of the abdomen and pelvis was performed using the standard protocol following bolus administration of intravenous contrast. CONTRAST:  176m OMNIPAQUE IOHEXOL 300 MG/ML  SOLN COMPARISON:  Ultrasound 02/04/2017.  CT report from 05/25/2003 FINDINGS: Lower chest: Subtle patchy densities in the right middle lobe concerning for infectious or inflammatory process. No large pleural effusions. Hepatobiliary: Liver has a  nodular contour. Enlargement of the caudate lobe. Findings are compatible with cirrhosis. There is moderate amount of perihepatic ascites. No suspicious liver lesion. Main portal veins are patent. The gallbladder wall is either calcified or there is a very large stone in the gallbladder measuring up to 3.7 cm. 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 No significant biliary dilatation. Pancreas: Unremarkable. No pancreatic ductal dilatation or surrounding inflammatory changes. Spleen: Spleen is prominent for size and measures 14.1 cm in the craniocaudal dimension. No focal splenic lesion. There is perisplenic ascites. Adrenals/Urinary Tract: Normal appearance of the adrenal glands. Low-density structure of the right kidney upper pole likely represents a cyst. No suspicious renal lesion. No hydronephrosis. Fluid in the urinary bladder. Stomach/Bowel: Diverticula in the sigmoid colon and descending colon. Colonic wall thickening at the hepatic flexure and ascending colon. Colon wall thickening is well demonstrated on sequence 2 image 36. There is oral contrast throughout the colon and no evidence for obstruction. Few mildly dilated loops of small bowel throughout the abdomen. Small amount of fluid around the stomach without gross abnormality to the stomach. Vascular/Lymphatic: Normal caliber of the abdominal aorta without aneurysm or significant atherosclerotic disease. Main visceral arteries are patent. Bilateral iliac arteries  are patent without significant stenosis. Proximal femoral arteries are patent. Portal venous system is patent. SMV is patent. Splenic vein appears to be patent. No significant gastric or esophageal varices. Slightly prominent mesenteric lymph nodes. Overall, no significant lymph node enlargement in the abdomen or pelvis. Reproductive: Prostate is prominent measuring 4.6 cm in the transverse dimension. Other: Small to moderate amount ascites in the lower abdomen and  pelvis. Moderate amount ascites around the liver. Diffuse mesenteric edema. Subcutaneous edema around the umbilicus. Bilateral subcutaneous edema in the abdominal oblique regions. Musculoskeletal: No acute bone abnormality. IMPRESSION: 1. Cirrhosis with moderate amount ascites and splenomegaly. Findings are compatible with portal hypertension. 2. Wall thickening involving the ascending colon and hepatic flexure. Some of the wall thickening could be associated with the adjacent ascites but also concerning for colitis involving the right colon. 3. Subtle patchy densities in the right middle lobe could represent mild infection or inflammation. 4. Extensive calcifications involving the gallbladder. 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 There may be gallstones near the gallbladder base. Electronically Signed: By: Markus Daft M.D. On: 12/24/2020 17:02   US Abdomen Limited  Result Date: 12/26/2020 CLINICAL DATA:  Abdominal swelling EXAM: ULTRASOUND ABDOMEN LIMITED RIGHT UPPER QUADRANT COMPARISON:  CT abdomen 12/24/2020 FINDINGS: Gallbladder: Calcified gallbladder wall versus large gallstone in the gallbladder fossa. No significant interval change compared with the prior CT of 12/24/2020. No sonographic Murphy sign noted by sonographer. Common bile duct: Diameter: 4 mm Liver: No focal hepatic mass. Nodular contour of the liver with a heterogeneous echotexture. Portal vein is patent on color Doppler imaging with normal direction of blood flow towards the liver. Other: Abdominal ascites. IMPRESSION: 1. Calcified gallbladder wall versus large gallstone in the gallbladder fossa. No significant interval change compared with prior CT of 12/24/2020. 2. Cirrhosis. No focal hepatic mass.  Abdominal ascites. Electronically Signed   By: Kathreen Devoid   On: 12/26/2020 16:44   DG Chest Portable 1 View  Result Date: 12/26/2020 CLINICAL DATA:   Cough EXAM: PORTABLE CHEST 1 VIEW COMPARISON:  03/22/2018 FINDINGS: The heart size and mediastinal contours are within normal limits. Mild bibasilar interstitial prominence. No focal airspace consolidation, pleural effusion, or pneumothorax. The visualized skeletal structures are unremarkable. IMPRESSION: Mild bibasilar interstitial prominence, may reflect bronchitic type lung changes versus developing atypical/viral infection. Electronically Signed   By: Davina Poke D.O.   On: 12/26/2020 13:44   [place] ASCITES (ABDOMEN LIMITED)  Result Date: 12/27/2020 CLINICAL DATA:  55 year old male with ascites presenting for possible paracentesis. Recently started on Lasix. EXAM: LIMITED ABDOMEN ULTRASOUND FOR ASCITES TECHNIQUE: Limited ultrasound survey for ascites was performed in all four abdominal quadrants. COMPARISON:  Abdominal ultrasound 12/26/2020 FINDINGS: Marked interval reduction in the volume of ascites. Only trace ascites remains and is insufficient for paracentesis. IMPRESSION: Marked interval reduction in the volume of ascites. The small volume residual ascites is insufficient for paracentesis. Electronically Signed   By: Jacqulynn Cadet M.D.   On: 12/27/2020 12:44   VAS [place] LOWER EXTREMITY VENOUS (DVT)  Result Date: 12/27/2020  Lower Venous DVT Study Indications: Edema.  Risk Factors: Liver cirrhosis. Limitations: Edema. Comparison Study: No prior study Performing Technologist: Sharion Dove RVS  Examination Guidelines: A complete evaluation includes B-mode imaging, spectral Doppler, color Doppler, and power Doppler as needed of all accessible portions of each vessel. Bilateral testing is considered an integral part of a complete examination. Limited examinations for reoccurring  indications may be performed as noted. The reflux portion of the exam is performed with the patient in reverse Trendelenburg.  +---------+---------------+---------+-----------+----------+--------------+ RIGHT     CompressibilityPhasicitySpontaneityPropertiesThrombus Aging +---------+---------------+---------+-----------+----------+--------------+ CFV      Full           Yes      Yes                                 +---------+---------------+---------+-----------+----------+--------------+ SFJ      Full                                                        +---------+---------------+---------+-----------+----------+--------------+ FV Prox  Full                                                        +---------+---------------+---------+-----------+----------+--------------+ FV Mid   Full                                                        +---------+---------------+---------+-----------+----------+--------------+ FV DistalFull                                                        +---------+---------------+---------+-----------+----------+--------------+ PFV      Full                                                        +---------+---------------+---------+-----------+----------+--------------+ POP      Full           Yes      Yes                                 +---------+---------------+---------+-----------+----------+--------------+ PTV      Full                                                        +---------+---------------+---------+-----------+----------+--------------+ PERO     Full                                                        +---------+---------------+---------+-----------+----------+--------------+   +---------+---------------+---------+-----------+----------+--------------+ LEFT     CompressibilityPhasicitySpontaneityPropertiesThrombus Aging +---------+---------------+---------+-----------+----------+--------------+ CFV      Full  Yes      Yes                                 +---------+---------------+---------+-----------+----------+--------------+ SFJ      Full                                                         +---------+---------------+---------+-----------+----------+--------------+ FV Prox  Full                                                        +---------+---------------+---------+-----------+----------+--------------+ FV Mid   Full                                                        +---------+---------------+---------+-----------+----------+--------------+ FV DistalFull                                                        +---------+---------------+---------+-----------+----------+--------------+ PFV      Full                                                        +---------+---------------+---------+-----------+----------+--------------+ POP      Full           Yes      Yes                                 +---------+---------------+---------+-----------+----------+--------------+ PTV      Full                                                        +---------+---------------+---------+-----------+----------+--------------+ PERO     Full                                                        +---------+---------------+---------+-----------+----------+--------------+     Summary: BILATERAL: - No evidence of deep vein thrombosis seen in the lower extremities, bilaterally. -   *See table(s) above for measurements and observations. Electronically signed by Jamelle Haring on 12/27/2020 at 2:45:24 PM.    Final       Subjective: Patient seen and examined the bedside this morning.  Hemodynamically stable for discharge today.  Discharge Exam: Vitals:   12/28/20 2102  12/29/20 0500  BP: 132/78 125/76  Pulse: 67 69  Resp: 17 17  Temp: 98.4 F (36.9 C) 97.8 F (36.6 C)  SpO2: 99% 99%   Vitals:   12/28/20 0359 12/28/20 1502 12/28/20 2102 12/29/20 0500  BP: 106/60 125/71 132/78 125/76  Pulse: 79 76 67 69  Resp: 18 16 17 17   Temp: 98.7 F (37.1 C) 98 F (36.7 C) 98.4 F (36.9 C) 97.8 F (36.6 C)  TempSrc: Oral Oral Oral Oral   SpO2: 98% 99% 99% 99%  Weight:      Height:        General: Pt is alert, awake, not in acute distress Cardiovascular: RRR, S1/S2 +, no rubs, no gallops Respiratory: CTA bilaterally, no wheezing, no rhonchi Abdominal: Soft, NT, ND, bowel sounds + Extremities: no edema, no cyanosis    The results of significant diagnostics from this hospitalization (including imaging, microbiology, ancillary and laboratory) are listed below for reference.     Microbiology: Recent Results (from the past 240 hour(s))  SARS Coronavirus 2 by RT PCR (hospital order, performed in [facility] hospital lab) Nasopharyngeal Nasopharyngeal Swab     Status: None   Collection Time: 12/26/20 11:47 AM   Specimen: Nasopharyngeal Swab  Result Value Ref Range Status   SARS Coronavirus 2 NEGATIVE NEGATIVE Final    Comment: (NOTE) SARS-CoV-2 target nucleic acids are NOT DETECTED.  The SARS-CoV-2 RNA is generally detectable in upper and lower respiratory specimens during the acute phase of infection. The lowest concentration of SARS-CoV-2 viral copies this assay can detect is 250 copies / mL. A negative result does not preclude SARS-CoV-2 infection and should not be used as the sole basis for treatment or other patient management decisions.  A negative result may occur with improper specimen collection / handling, submission of specimen other than nasopharyngeal swab, presence of viral mutation(s) within the areas targeted by this assay, and inadequate number of viral copies (<250 copies / mL). A negative result must be combined with clinical observations, patient history, and epidemiological information.  Fact Sheet for Patients:   StrictlyIdeas.no  Fact Sheet for Healthcare Providers: BankingDealers.co.za  This test is not yet approved or  cleared by the [place] FDA and has been authorized for detection and/or diagnosis of SARS-CoV-2 by FDA under an  Emergency Use Authorization (EUA).  This EUA will remain in effect (meaning this test can be used) for the duration of the COVID-19 declaration under Section 564(b)(1) of the Act, 21 U.S.C. section 360bbb-3(b)(1), unless the authorization is terminated or revoked sooner.  Performed at [facility], [place] [address], [place], [place] [address]   Gastrointestinal Panel by PCR , Stool     Status: Abnormal   Collection Time: 12/27/20  1:20 AM   Specimen: Stool  Result Value Ref Range Status   Campylobacter species NOT DETECTED NOT DETECTED Final   Plesimonas shigelloides NOT DETECTED NOT DETECTED Final   Salmonella species NOT DETECTED NOT DETECTED Final   Yersinia enterocolitica NOT DETECTED NOT DETECTED Final   Vibrio species NOT DETECTED NOT DETECTED Final   Vibrio cholerae NOT DETECTED NOT DETECTED Final   Enteroaggregative E coli (EAEC) DETECTED (A) NOT DETECTED Final    Comment: RESULT CALLED TO, READ BACK BY AND VERIFIED WITH:  JOANNE HAMZE AT 2595 12/27/20 SDR    Enteropathogenic E coli (EPEC) NOT DETECTED NOT DETECTED Final   Enterotoxigenic E coli (ETEC) NOT DETECTED NOT DETECTED Final   Shiga like toxin producing E coli (STEC)  NOT DETECTED NOT DETECTED Final   Shigella/Enteroinvasive E coli (EIEC) NOT DETECTED NOT DETECTED Final   Cryptosporidium NOT DETECTED NOT DETECTED Final   Cyclospora cayetanensis NOT DETECTED NOT DETECTED Final   Entamoeba histolytica NOT DETECTED NOT DETECTED Final   Giardia lamblia NOT DETECTED NOT DETECTED Final   Adenovirus F40/41 NOT DETECTED NOT DETECTED Final   Astrovirus NOT DETECTED NOT DETECTED Final   Norovirus GI/GII NOT DETECTED NOT DETECTED Final   Rotavirus A NOT DETECTED NOT DETECTED Final   Sapovirus (I, II, IV, and V) NOT DETECTED NOT DETECTED Final    Comment: Performed at [facility], [address] [place], [place] [address]     Labs: BNP (last 3 results) No results for input(s): BNP in the last 8760  hours. Basic Metabolic Panel: Recent Labs  Lab 12/24/20 1333 12/26/20 1013 12/26/20 1516 12/27/20 0046 12/28/20 0918 12/29/20 0212  NA 135 134*  --  134* 135 138  K 3.8 3.8  --  4.0 3.8 4.4  CL 106 107  --  109 108 112*  CO2 23 19*  --  20* 20* 21*  GLUCOSE 104* 117*  --  136* 169* 124*  BUN 12 8  --  10 11 12   CREATININE 0.80 0.86  --  0.81 0.72 0.81  CALCIUM 8.3* 8.1*  --  8.3* 8.4* 8.4*  MG  --   --  1.6*  --   --   --   PHOS  --   --  3.7  --   --   --    Liver Function Tests: Recent Labs  Lab 12/24/20 1333 12/26/20 1013 12/27/20 0046 12/28/20 0918 12/29/20 0212  AST 138*  138* 167* 142* 113* 122*  ALT 39  39 48* 42 39 41  ALKPHOS 323*  323* 339* 277* 257* 230*  BILITOT 12.1*  12.1* 10.6* 11.7* 9.4* 8.7*  PROT 5.6*  5.6* 5.9* 5.8* 5.6* 5.5*  ALBUMIN 2.4*  2.4* 2.0* 2.0* 2.6* 2.4*   Recent Labs  Lab 12/26/20 1013  LIPASE 47   Recent Labs  Lab 12/26/20 1952  AMMONIA 39*   CBC: Recent Labs  Lab 12/22/20 1045 12/24/20 1333 12/26/20 1013 12/27/20 0046 12/28/20 0918 12/29/20 0212  WBC 5.5 5.2 6.2 3.8* 7.3 6.0  NEUTROABS 3.4 3.2  --   --  5.7 4.6  HGB 12.8* 12.5* 12.2* 11.1* 10.4* 10.3*  HCT 37.1* 35.8* 35.0* 32.4* 30.9* 31.2*  MCV 94.3 93.2 93.1 92.8 94.5 95.4  PLT 85.0 Repeated and verified X2.* 80.0* 81* 71* 63* 64*   Cardiac Enzymes: Recent Labs  Lab 12/26/20 1505  CKTOTAL 226   BNP: Invalid input(s): POCBNP CBG: No results for input(s): GLUCAP in the last 168 hours. D-Dimer No results for input(s): DDIMER in the last 72 hours. Hgb A1c No results for input(s): HGBA1C in the last 72 hours. Lipid Profile No results for input(s): CHOL, HDL, LDLCALC, TRIG, CHOLHDL, LDLDIRECT in the last 72 hours. Thyroid function studies No results for input(s): TSH, T4TOTAL, T3FREE, THYROIDAB in the last 72 hours.  Invalid input(s): FREET3 Anemia work up No results for input(s): VITAMINB12, FOLATE, FERRITIN, TIBC, IRON, RETICCTPCT in the last 72  hours. Urinalysis    Component Value Date/Time   COLORURINE AMBER (A) 12/26/2020 1115   APPEARANCEUR CLEAR 12/26/2020 1115   LABSPEC 1.016 12/26/2020 1115   PHURINE 6.0 12/26/2020 1115   GLUCOSEU NEGATIVE 12/26/2020 1115   HGBUR MODERATE (A) 12/26/2020 1115   BILIRUBINUR SMALL (A) 12/26/2020  [place] (A) 10/17/2020 1627   KETONESUR NEGATIVE 12/26/2020 1115   PROTEINUR NEGATIVE 12/26/2020 1115   UROBILINOGEN 1.0 10/17/2020 1627   NITRITE NEGATIVE 12/26/2020 1115   LEUKOCYTESUR NEGATIVE 12/26/2020 1115   Sepsis Labs Invalid input(s): PROCALCITONIN,  WBC,  LACTICIDVEN Microbiology Recent Results (from the past 240 hour(s))  SARS Coronavirus 2 by RT PCR (hospital order, performed in [facility] hospital lab) Nasopharyngeal Nasopharyngeal Swab     Status: None   Collection Time: 12/26/20 11:47 AM   Specimen: Nasopharyngeal Swab  Result Value Ref Range Status   SARS Coronavirus 2 NEGATIVE NEGATIVE Final    Comment: (NOTE) SARS-CoV-2 target nucleic acids are NOT DETECTED.  The SARS-CoV-2 RNA is generally detectable in upper and lower respiratory specimens during the acute phase of infection. The lowest concentration of SARS-CoV-2 viral copies this assay can detect is 250 copies / mL. A negative result does not preclude SARS-CoV-2 infection and should not be used as the sole basis for treatment or other patient management decisions.  A negative result may occur with improper specimen collection / handling, submission of specimen other than nasopharyngeal swab, presence of viral mutation(s) within the areas targeted by this assay, and inadequate number of viral copies (<250 copies / mL). A negative result must be combined with clinical observations, patient history, and epidemiological information.  Fact Sheet for Patients:   StrictlyIdeas.no  Fact Sheet for Healthcare Providers: BankingDealers.co.za  This test is  not yet approved or  cleared by the [place] FDA and has been authorized for detection and/or diagnosis of SARS-CoV-2 by FDA under an Emergency Use Authorization (EUA).  This EUA will remain in effect (meaning this test can be used) for the duration of the COVID-19 declaration under Section 564(b)(1) of the Act, 21 U.S.C. section 360bbb-3(b)(1), unless the authorization is terminated or revoked sooner.  Performed at [facility], [place] [address] [place], [place] [address]   Gastrointestinal Panel by PCR , Stool     Status: Abnormal   Collection Time: 12/27/20  1:20 AM   Specimen: Stool  Result Value Ref Range Status   Campylobacter species NOT DETECTED NOT DETECTED Final   Plesimonas shigelloides NOT DETECTED NOT DETECTED Final   Salmonella species NOT DETECTED NOT DETECTED Final   Yersinia enterocolitica NOT DETECTED NOT DETECTED Final   Vibrio species NOT DETECTED NOT DETECTED Final   Vibrio cholerae NOT DETECTED NOT DETECTED Final   Enteroaggregative E coli (EAEC) DETECTED (A) NOT DETECTED Final    Comment: RESULT CALLED TO, READ BACK BY AND VERIFIED WITH:  JOANNE HAMZE AT 2482 12/27/20 SDR    Enteropathogenic E coli (EPEC) NOT DETECTED NOT DETECTED Final   Enterotoxigenic E coli (ETEC) NOT DETECTED NOT DETECTED Final   Shiga like toxin producing E coli (STEC) NOT DETECTED NOT DETECTED Final   Shigella/Enteroinvasive E coli (EIEC) NOT DETECTED NOT DETECTED Final   Cryptosporidium NOT DETECTED NOT DETECTED Final   Cyclospora cayetanensis NOT DETECTED NOT DETECTED Final   Entamoeba histolytica NOT DETECTED NOT DETECTED Final   Giardia lamblia NOT DETECTED NOT DETECTED Final   Adenovirus F40/41 NOT DETECTED NOT DETECTED Final   Astrovirus NOT DETECTED NOT DETECTED Final   Norovirus GI/GII NOT DETECTED NOT DETECTED Final   Rotavirus A NOT DETECTED NOT DETECTED Final   Sapovirus (I, II, IV, and V) NOT DETECTED NOT DETECTED Final    Comment: Performed at [facility], [address] [place], [place] [address]  Please note: You were cared for by a hospitalist during your hospital stay. Once you are discharged, your primary care physician will handle any further medical issues. Please note that NO REFILLS for any discharge medications will be authorized once you are discharged, as it is imperative that you return to your primary care physician (or establish a relationship with a primary care physician if you do not have one) for your post hospital discharge needs so that they can reassess your need for medications and monitor your lab values.    Time coordinating discharge: 40 minutes  SIGNED:   Shelly Coss, MD  [facility] 12/29/2020, 10:30 AM Pager [phone]  If 7PM-7AM, please contact night-coverage [email or web address] Password [email or web address]

## 2020-12-29 NOTE — Telephone Encounter (Signed)
Following fax received from [facility] re: pt Rx for Prednisolone:   Co-pay $366.00 - Pharmacy requesting substitute  Routing this message to Dr. Orvan Falconer for further advice

## 2020-12-29 NOTE — Telephone Encounter (Signed)
Pt is requesting a call back from a nurse to discuss whether or not he still needs to come in for his procedure tomorrow since he was seen at the hospital.

## 2020-12-29 NOTE — Discharge Instructions (Signed)
Ascites  Ascites is a collection of too much fluid in the abdomen. Ascites can range from mild to severe. If ascites is not treated, it can get worse. What are the causes? This condition may be caused by:  A liver condition called cirrhosis. This is the most common cause of ascites.  Long-term (chronic) or alcoholic hepatitis.  Infection or inflammation in the abdomen.  Cancer in the abdomen.  Heart failure.  Kidney disease.  Inflammation of the pancreas.  Clots in the veins of the liver. What are the signs or symptoms? Symptoms of this condition include:  A feeling of fullness in the abdomen. This is common.  An increase in the size of the abdomen or waist.  Swelling in the legs.  Swelling of the scrotum.  Difficulty breathing.  Pain in the abdomen.  Sudden weight gain. If the condition is mild, you may not have symptoms. How is this diagnosed? This condition is diagnosed based on your medical history and a physical exam. Your health care provider may order imaging tests, such as an ultrasound or CT scan of your abdomen. How is this treated? Treatment for this condition depends on the cause of the ascites. It may include:  Taking a pill to make you urinate. This is called a water pill (diuretic pill).  Strictly reducing your salt (sodium) intake. Salt can cause extra fluid to be kept (retained) in the body, and this makes ascites worse.  Having a procedure to remove fluid from your abdomen (paracentesis).  Having a procedure that connects two of the major veins within your liver and relieves pressure on your liver. This is called a TIPS procedure (transjugular intrahepatic portosystemic shunt procedure).  Placement of a drainage catheter (peritoneovenous shunt) to manage the extra fluid in the abdomen. Ascites may go away or improve when the condition that caused it is treated. Follow these instructions at home: Eating and drinking  Keep track of your weight.  To do this, weigh yourself at the same time every day and write down your weight.  Try not to eat salty (high-sodium) foods.  Follow any instructions that your health care provider gives you about how much to drink.  Keep track of how much you drink and any changes in how much or how often you urinate. General instructions  Report any changes in your health to your health care provider, especially if you develop new symptoms or your symptoms get worse.  Take over-the-counter and prescription medicines only as told by your health care provider.  Keep all follow-up visits. This is important. Contact a health care provider if:  You gain more than 3 lb (1.36 kg) in 3 days.  Your waist size increases.  You have new swelling in your legs.  The swelling in your legs gets worse. Get help right away if:  You have a fever or chills.  You are confused.  You have new or worsening breathing trouble.  You have new or worsening pain in your abdomen.  You have new or worsening swelling in the scrotum. Summary  Ascites is a collection of too much fluid in the abdomen.  Ascites may be caused by various conditions, such as cirrhosis, hepatitis, cancer, or congestive heart failure.  Symptoms may include swelling of the abdomen and other areas due to extra fluid in the body.  Treatments may involve dietary changes, medicines, or procedures. This information is not intended to replace advice given to you by your health care provider. Make sure you   discuss any questions you have with your health care provider. Document Revised: 07/29/2020 Document Reviewed: 07/29/2020 Elsevier Patient Education  2021 Elsevier Inc.  

## 2020-12-29 NOTE — Telephone Encounter (Signed)
Pt was in the hospital over the weekend and is being discharged. He has already eaten today.  Pt is scheduled for an ECL tomorrow and wants to know if he still needs to keep that appt. Please advise.

## 2020-12-29 NOTE — Social Work (Signed)
CSW gave pt alcohol use educational handout and community resources in DC packet.   Ian Hall, Latanya Presser, [place] Social Worker [phone]

## 2020-12-29 NOTE — Telephone Encounter (Signed)
Patient is hospitalized. No need to work on this refill at this time. Thanks.

## 2020-12-29 NOTE — Telephone Encounter (Signed)
Yes. He needs to keep that appointment today. Please ask him to take Dulcolax 20 mg x 1 this afternoon and again at 10pm tonight in addition to the regular bowel prep. Thanks.

## 2020-12-30 ENCOUNTER — Telehealth: Payer: Self-pay

## 2020-12-30 ENCOUNTER — Encounter: Payer: Self-pay | Admitting: Gastroenterology

## 2020-12-30 ENCOUNTER — Ambulatory Visit (AMBULATORY_SURGERY_CENTER): Payer: No Typology Code available for payment source | Admitting: Gastroenterology

## 2020-12-30 ENCOUNTER — Other Ambulatory Visit: Payer: Self-pay

## 2020-12-30 VITALS — BP 120/64 | HR 64 | Temp 97.7°F | Resp 12

## 2020-12-30 DIAGNOSIS — K319 Disease of stomach and duodenum, unspecified: Secondary | ICD-10-CM

## 2020-12-30 DIAGNOSIS — R933 Abnormal findings on diagnostic imaging of other parts of digestive tract: Secondary | ICD-10-CM | POA: Diagnosis not present

## 2020-12-30 DIAGNOSIS — K219 Gastro-esophageal reflux disease without esophagitis: Secondary | ICD-10-CM

## 2020-12-30 DIAGNOSIS — K648 Other hemorrhoids: Secondary | ICD-10-CM

## 2020-12-30 DIAGNOSIS — K5730 Diverticulosis of large intestine without perforation or abscess without bleeding: Secondary | ICD-10-CM

## 2020-12-30 DIAGNOSIS — K529 Noninfective gastroenteritis and colitis, unspecified: Secondary | ICD-10-CM

## 2020-12-30 DIAGNOSIS — K259 Gastric ulcer, unspecified as acute or chronic, without hemorrhage or perforation: Secondary | ICD-10-CM

## 2020-12-30 DIAGNOSIS — R634 Abnormal weight loss: Secondary | ICD-10-CM

## 2020-12-30 MED ORDER — SODIUM CHLORIDE 0.9 % IV SOLN: 500.0000 mL | Freq: Once | INTRAVENOUS | Status: DC

## 2020-12-30 NOTE — Telephone Encounter (Signed)
Pt scheduled to see Dr. Tarri Glenn Monday 01/05/21@8 :50am. Pt aware of appt.

## 2020-12-30 NOTE — Progress Notes (Signed)
Report to PACU, RN, vss, BBS= Clear.  

## 2020-12-30 NOTE — Progress Notes (Signed)
Eyes sclera yellow bilateral.

## 2020-12-30 NOTE — Op Note (Signed)
[facility] Patient Name: Ian Hall Procedure Date: 12/30/2020 10:22 AM MRN: [id number] Endoscopist: Tressia Danas MD, MD Age: 54 Referring MD:  Date of Birth: 02-08-1966 Gender: Male Account #: [id number] Procedure:                Upper GI endoscopy Indications:              Abdominal pain, Cirrhosis rule out esophageal                            varices Medicines:                Monitored Anesthesia Care Procedure:                Pre-Anesthesia Assessment:                           - Prior to the procedure, a History and Physical                            was performed, and patient medications and                            allergies were reviewed. The patient's tolerance of                            previous anesthesia was also reviewed. The risks                            and benefits of the procedure and the sedation                            options and risks were discussed with the patient.                            All questions were answered, and informed consent                            was obtained. Prior Anticoagulants: The patient has                            taken no previous anticoagulant or antiplatelet                            agents. ASA Grade Assessment: III - A patient with                            severe systemic disease. After reviewing the risks                            and benefits, the patient was deemed in                            satisfactory condition to undergo the procedure.  After obtaining informed consent, the endoscope was                            passed under direct vision. Throughout the                            procedure, the patient's blood pressure, pulse, and                            oxygen saturations were monitored continuously. The                            Endoscope was introduced through the mouth, and                            advanced to the third part of duodenum. The upper                             GI endoscopy was accomplished without difficulty.                            The patient tolerated the procedure well. Scope In: Scope Out: Findings:                 Diffuse mild mucosal changes characterized by                            discoloration were found in the entire esophagus.                            Biopsies were taken with a cold forceps for                            histology. Estimated blood loss was minimal. No                            esophageal varices seen.                           A medium amount of food (residue) was found in the                            entire examined stomach.                           Patchy mildly erythematous mucosa without bleeding                            was found in the gastric body. Biopsies were taken                            with a cold forceps for histology. Estimated blood  loss was minimal.                           The stomach was normal. No obvious portal                            hypertensive gastropathy, although views limited                            due to food. No gastric varices.                           The cardia and gastric fundus were normal on                            retroflexion.                           The exam was otherwise without abnormality. Complications:            No immediate complications. Estimated blood loss:                            Minimal. Estimated Blood Loss:     Estimated blood loss was minimal. Impression:               - Discolored mucosa in the esophagus. Biopsied.                           - A medium amount of food (residue) in the stomach.                           - Erythematous mucosa in the gastric body. Biopsied.                           - Normal stomach.                           - The examination was otherwise normal. Recommendation:           - Patient has a contact number available for                             emergencies. The signs and symptoms of potential                            delayed complications were discussed with the                            patient. Return to normal activities tomorrow.                            Written discharge instructions were provided to the                            patient.                           -  Resume previous diet.                           - Continue present medications.                           - Await pathology results.                           - Proceed with colonoscopy as planned. Tressia Danas MD, MD 12/30/2020 10:55:30 AM This report has been signed electronically.

## 2020-12-30 NOTE — Progress Notes (Signed)
Work note generated writing patient out of work for one week, returning Tuesday 01/06/2021 per DR Orvan Falconer.

## 2020-12-30 NOTE — Progress Notes (Signed)
C.W. vital signs. 

## 2020-12-30 NOTE — Patient Instructions (Signed)
Thank you for allowing [place] to care for you today!  Continue to take the antibiotics as prescribed until finished.  Avoid any alcohol.  Stay out of work for the next week.  Work note provided for you to return to work Tuesday 01/06/2021.  Resume previous diet and other medications today.  Rest for the remainder of the day today, no driving or any strenuous activity.      YOU HAD AN ENDOSCOPIC PROCEDURE TODAY AT [facility]:   Refer to the procedure report that was given to you for any specific questions about what was found during the examination.  If the procedure report does not answer your questions, please call your gastroenterologist to clarify.  If you requested that your care partner not be given the details of your procedure findings, then the procedure report has been included in a sealed envelope for you to review at your convenience later.  YOU SHOULD EXPECT: Some feelings of bloating in the abdomen. Passage of more gas than usual.  Walking can help get rid of the air that was put into your GI tract during the procedure and reduce the bloating. If you had a lower endoscopy (such as a colonoscopy or flexible sigmoidoscopy) you may notice spotting of blood in your stool or on the toilet paper. If you underwent a bowel prep for your procedure, you may not have a normal bowel movement for a few days.  Please Note:  You might notice some irritation and congestion in your nose or some drainage.  This is from the oxygen used during your procedure.  There is no need for concern and it should clear up in a day or so.  SYMPTOMS TO REPORT IMMEDIATELY:   Following lower endoscopy (colonoscopy or flexible sigmoidoscopy):  Excessive amounts of blood in the stool  Significant tenderness or worsening of abdominal pains  Swelling of the abdomen that is new, acute  Fever of 100F or higher   Following upper endoscopy (EGD)  Vomiting of blood or coffee ground material  New  chest pain or pain under the shoulder blades  Painful or persistently difficult swallowing  New shortness of breath  Fever of 100F or higher  Black, tarry-looking stools  For urgent or emergent issues, a gastroenterologist can be reached at any hour by calling [phone]. Do not use MyChart messaging for urgent concerns.    DIET:  We do recommend a small meal at first, but then you may proceed to your regular diet.  Drink plenty of fluids but you should avoid alcoholic beverages for 24 hours.  ACTIVITY:  You should plan to take it easy for the rest of today and you should NOT DRIVE or use heavy machinery until tomorrow (because of the sedation medicines used during the test).    FOLLOW UP: Our staff will call the number listed on your records 48-72 hours following your procedure to check on you and address any questions or concerns that you may have regarding the information given to you following your procedure. If we do not reach you, we will leave a message.  We will attempt to reach you two times.  During this call, we will ask if you have developed any symptoms of COVID 19. If you develop any symptoms (ie: fever, flu-like symptoms, shortness of breath, cough etc.) before then, please call [phone].  If you test positive for Covid 19 in the 2 weeks post procedure, please call and report this information to [place].  If any biopsies were taken you will be contacted by phone or by letter within the next 1-3 weeks.  Please call us at [phone] if you have not heard about the biopsies in 3 weeks.    SIGNATURES/CONFIDENTIALITY: You and/or your care partner have signed paperwork which will be entered into your electronic medical record.  These signatures attest to the fact that that the information above on your After Visit Summary has been reviewed and is understood.  Full responsibility of the confidentiality of this discharge information lies with you and/or your care-partner.

## 2020-12-30 NOTE — Op Note (Signed)
[facility] Patient Name: Ian Hall Procedure Date: 12/30/2020 10:22 AM MRN: [id number] Endoscopist: Tressia Danas MD, MD Age: 55 Referring MD:  Date of Birth: [birth year] Gender: Male Account #: [id number] Procedure:                Colonoscopy Indications:              Abdominal pain, Abnormal CT of the GI tract Medicines:                Monitored Anesthesia Care Procedure:                Pre-Anesthesia Assessment:                           - Prior to the procedure, a History and Physical                            was performed, and patient medications and                            allergies were reviewed. The patient's tolerance of                            previous anesthesia was also reviewed. The risks                            and benefits of the procedure and the sedation                            options and risks were discussed with the patient.                            All questions were answered, and informed consent                            was obtained. Prior Anticoagulants: The patient has                            taken no previous anticoagulant or antiplatelet                            agents. ASA Grade Assessment: III - A patient with                            severe systemic disease. After reviewing the risks                            and benefits, the patient was deemed in                            satisfactory condition to undergo the procedure.                           After obtaining informed consent, the colonoscope  was passed under direct vision. Throughout the                            procedure, the patient's blood pressure, pulse, and                            oxygen saturations were monitored continuously. The                            Olympus CF-HQ190 [id number]) Colonoscope was                            introduced through the anus and advanced to the the                            cecum, identified  by appendiceal orifice and                            ileocecal valve. The colonoscopy was technically                            difficult and complex due to inadequate bowel prep.                            The patient tolerated the procedure well. The                            quality of the bowel preparation was poor. The                            ileocecal valve, appendiceal orifice, and rectum                            were photographed. Scope In: 10:39:21 AM Scope Out: 10:48:06 AM Scope Withdrawal Time: 0 hours 4 minutes 39 seconds  Total Procedure Duration: 0 hours 8 minutes 45 seconds  Findings:                 Hemorrhoids were found on perianal exam.                           A few diverticula were found in the sigmoid colon                            and descending colon.                           Extensive amounts of solid stool was found in the                            entire colon. Evaluation for polyps could not be                            performed.  A patchy area of mildly erythematous mucosa was                            found in the ascending colon and proximal                            transverse colon. Biopsies were taken with a cold                            forceps for histology. Estimated blood loss was                            minimal.                           The exam was otherwise without abnormality on                            direct and retroflexion views. Complications:            No immediate complications. Estimated blood loss:                            Minimal. Estimated Blood Loss:     Estimated blood loss was minimal. Impression:               - Preparation of the colon was poor. Formed stool                            present throughout the colon. No meaningful                            evaluation for polyps could be performed today.                           - Hemorrhoids found on perianal exam.                            - Diverticulosis in the sigmoid colon and in the                            descending colon.                           - Erythematous mucosa in the ascending colon and                            proximal transverse colon. Biopsied.                           - The examination was otherwise normal on direct                            and retroflexion views. Recommendation:           - Patient has a contact number available for  emergencies. The signs and symptoms of potential                            delayed complications were discussed with the                            patient. Return to normal activities tomorrow.                            Written discharge instructions were provided to the                            patient.                           - Follow a high fiber diet. Drink at least 64                            ounces of water daily. Add a daily stool bulking                            agent such as psyllium (an exampled would be                            Metamucil).                           - Continue present medications including Cipro as                            prescribed on hospital discharge.                           - Await pathology results.                           - Repeat colonoscopy in the future for colon cancer                            screening because the bowel preparation was poor.                           - Return to GI office. Tressia Danas MD, MD 12/30/2020 11:03:39 AM This report has been signed electronically.

## 2020-12-30 NOTE — Telephone Encounter (Signed)
-----   Message from [place], MD sent at 12/30/2020 12:16 PM EST ----- Patient needs follow-up with me or an APP next week.  Thanks.  KLB

## 2020-12-30 NOTE — Telephone Encounter (Signed)
Transition Care Management Follow-up Telephone Call  Date of discharge and from where: 12/29/2020 from [facility]  How have you been since you were released from the hospital? Pt states that he is feeling some better.   Any questions or concerns? No  Items Reviewed:  Did the pt receive and understand the discharge instructions provided? Yes   Medications obtained and verified? Yes   Other? No   Any new allergies since your discharge? No   Dietary orders reviewed? Dietary Fiber increased.   Do you have support at home? Yes   Functional Questionnaire: (I = Independent and D = Dependent) ADLs: I  Bathing/Dressing- I  Meal Prep- I  Eating- I  Maintaining continence- I  Transferring/Ambulation- I  Managing Meds- I  Follow up appointments reviewed:   PCP Hospital f/u appt confirmed? No  Pt will PCP office to schedule a follow up appointment.   Specialist Hospital f/u appt confirmed? Yes  Scheduled to see Tressia Danas, MD on 01/16/2021 @ 1:50pm.  Are transportation arrangements needed? No   If their condition worsens, is the pt aware to call PCP or go to the Emergency Dept.? Yes  Was the patient provided with contact information for the PCP's office or ED? Yes  Was to pt encouraged to call back with questions or concerns? Yes

## 2020-12-30 NOTE — Progress Notes (Signed)
Pt's states no medical or surgical changes since previsit or office visit. 

## 2020-12-30 NOTE — Progress Notes (Signed)
Called to room to assist during endoscopic procedure.  Patient ID and intended procedure confirmed with present staff. Received instructions for my participation in the procedure from the performing physician.  

## 2021-01-01 ENCOUNTER — Telehealth: Payer: Self-pay

## 2021-01-01 ENCOUNTER — Telehealth: Payer: Self-pay | Admitting: Gastroenterology

## 2021-01-01 NOTE — Telephone Encounter (Signed)
  Follow up Call-  Call back number 12/30/2020  Post procedure Call Back phone  # [phone]  Permission to leave phone message Yes  Some recent data might be hidden     Patient questions:  Do you have a fever, pain , or abdominal swelling? Yes.   Pain Score  6 *  Have you tolerated food without any problems? Yes.  But abdomen is swollen.  Have you been able to return to your normal activities? No.  Do you have any questions about your discharge instructions: Diet   No. Medications  No. Follow up visit  No.  Do you have questions or concerns about your Care? Yes.    Actions: * If pain score is 4 or above: No action needed, pain <4.   Swelling after procedure and the swelling has not gone down since the procedure.  Rating pain 6/10.  1. Have you developed a fever since your procedure? no  2.   Have you had an respiratory symptoms (SOB or cough) since your procedure? No   3.   Have you tested positive for COVID 19 since your procedure no  4.   Have you had any family members/close contacts diagnosed with the COVID 19 since your procedure?  no   If yes to any of these questions please route to Joylene John, RN and Joella Prince, RN

## 2021-01-01 NOTE — Telephone Encounter (Signed)
Patient dropped off Work Release form his employer is requesting to be filled out before he is seen on Monday. Patient states his job depends on this paperwork. Patient was very sweet and kind. Papers placed on Dr. Tarri Glenn desk.

## 2021-01-01 NOTE — Telephone Encounter (Signed)
Per Dr. Tarri Glenn pt was having abdominal swelling and abdominal pain pre-procedure and post procedure.  Pt should have a follow up with Dr. Tarri Glenn next week.  Per Dr. Tarri Glenn if pt's pain is worse then he should go to the ED.  Message was given to the pt.  He states he understands. maw

## 2021-01-05 ENCOUNTER — Encounter: Payer: Self-pay | Admitting: Gastroenterology

## 2021-01-05 ENCOUNTER — Ambulatory Visit (INDEPENDENT_AMBULATORY_CARE_PROVIDER_SITE_OTHER): Payer: No Typology Code available for payment source | Admitting: Gastroenterology

## 2021-01-05 ENCOUNTER — Other Ambulatory Visit (INDEPENDENT_AMBULATORY_CARE_PROVIDER_SITE_OTHER): Payer: No Typology Code available for payment source

## 2021-01-05 VITALS — BP 140/80 | HR 102 | Ht 67.0 in | Wt 250.8 lb

## 2021-01-05 DIAGNOSIS — R7989 Other specified abnormal findings of blood chemistry: Secondary | ICD-10-CM

## 2021-01-05 DIAGNOSIS — K7010 Alcoholic hepatitis without ascites: Secondary | ICD-10-CM

## 2021-01-05 DIAGNOSIS — D696 Thrombocytopenia, unspecified: Secondary | ICD-10-CM | POA: Diagnosis not present

## 2021-01-05 DIAGNOSIS — K7011 Alcoholic hepatitis with ascites: Secondary | ICD-10-CM

## 2021-01-05 LAB — COMPREHENSIVE METABOLIC PANEL
ALT: 66 U/L — ABNORMAL HIGH (ref 0–53)
AST: 170 U/L — ABNORMAL HIGH (ref 0–37)
Albumin: 2.9 g/dL — ABNORMAL LOW (ref 3.5–5.2)
Alkaline Phosphatase: 263 U/L — ABNORMAL HIGH (ref 39–117)
BUN: 14 mg/dL (ref 6–23)
CO2: 24 mEq/L (ref 19–32)
Calcium: 8.3 mg/dL — ABNORMAL LOW (ref 8.4–10.5)
Chloride: 106 mEq/L (ref 96–112)
Creatinine, Ser: 0.83 mg/dL (ref 0.40–1.50)
GFR: 99.39 mL/min (ref >60.00)
Glucose, Bld: 133 mg/dL — ABNORMAL HIGH (ref 70–99)
Potassium: 3.8 mEq/L (ref 3.5–5.1)
Sodium: 135 mEq/L (ref 135–145)
Total Bilirubin: 14.6 mg/dL — ABNORMAL HIGH (ref 0.2–1.2)
Total Protein: 5.5 g/dL — ABNORMAL LOW (ref 6.0–8.3)

## 2021-01-05 LAB — CBC
HCT: 36.5 % — ABNORMAL LOW (ref 39.0–52.0)
Hemoglobin: 12.6 g/dL — ABNORMAL LOW (ref 13.0–17.0)
MCHC: 34.6 g/dL (ref 30.0–36.0)
MCV: 95.9 fl (ref 78.0–100.0)
Platelets: 50 10*3/uL — ABNORMAL LOW (ref 150.0–400.0)
RBC: 3.81 Mil/uL — ABNORMAL LOW (ref 4.22–5.81)
RDW: 20 % — ABNORMAL HIGH (ref 11.5–15.5)
WBC: 8.3 10*3/uL (ref 4.0–10.5)

## 2021-01-05 LAB — PROTIME-INR
INR: 1.7 ratio — ABNORMAL HIGH (ref 0.8–1.0)
Prothrombin Time: 19.4 s — ABNORMAL HIGH (ref 9.6–13.1)

## 2021-01-05 MED ORDER — FUROSEMIDE 40 MG PO TABS: 40.0000 mg | ORAL_TABLET | Freq: Every day | ORAL | 0 refills | Status: DC

## 2021-01-05 MED ORDER — SPIRONOLACTONE 100 MG PO TABS: 100.0000 mg | ORAL_TABLET | Freq: Every day | ORAL | 0 refills | Status: DC

## 2021-01-05 NOTE — Progress Notes (Addendum)
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 Will need to establish care with liver transplant center. No varices seen on recent EGD. No hepatoma or portal vein clot seen on imaging. Would like to plan MRI for [facility] surveillance this summer given his weight loss.   Ascites and peripheral edema: 2000 mg sodium restricted diet, add diuretics. Labs today and again later this week to closely follow his renal function.   Suspected alcoholic hepatitis: Completing 28 days of steroids for presumed alcoholic hepatitis. Bilirubin is improving but his other liver enzymes are not. Will need a liver biopsy if no response.    Colitis?: Biopsies from the colonoscopy were reviewed with the patient today and are essentially normal.   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 High risk for cholecystectomy at this time. Discussed potential symptoms of symptomatic gallbladder disease.    PLAN: CMP, CBC, PT/INR 2000 mg sodium restricted diet Aldactone 100 mg daily Lasix 40 mg daily Labs in 4-5 days Follow-up with me or Colleen in 1-2 weeks, earlier if needed He was counseled to continue to abstain from all alcohol  Please see the "Patient Instructions" section for addition details about the plan.  HPI: Ian Hall is a 55 y.o. male who returns in follow-up for a new diagnosis of liver disease.  Primary complaint today is abdominal pain associated with increasing abdominal girth and lower extremity edema. He notes early  satiety and poor appetite.   He is not drinking any alcohol since Christmas. He follows no special diets, although he likes salads. He has been unable to work due to ongoing symptoms.   Events since his last office visit with me: - CT abd/pelvis with contrast 12/24/20: cirrhosis with perihepatic ascites and splenomegaly, patent portal vein, calcified gallbladder wall versus large stone in the GB (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), diverticulosis, colonic wall thickening, anasarca - Started steroids for possible alcoholic hepatitis - Abdominal ultrasound 12/26/20: calcified gallbladder wall versus large gallstone - Not enough ascites for paracentesis 12/27/20 - Stool + for Enteroaggregative E coli, treated with Cipro - EGD 12/30/20: food in the stomach, no varices or portal hypertensive gastropathy - Colonoscopy 12/30/20: patchy colitis, diverticulosis - Most recent labs from 12/29/20: TB now to 8.7, AST 122, ALT 41, alk phos down to 230, alb 2.4, hgb 10.3, MCV 95.4, RDW 17.9, platelets 64, INR 1.7     Past Medical History:  Diagnosis Date  . Anxiety   . COVID-19     Past Surgical History:  Procedure Laterality Date  . ANKLE SURGERY Right     Current Outpatient Medications  Medication Sig Dispense Refill  . dicyclomine (BENTYL) 10 MG capsule Take 1 capsule (10 mg total) by mouth 4 (four) times daily -  before meals and at bedtime. 536 capsule 2  . folic acid (FOLVITE) 1 MG tablet Take 1 tablet (1 mg total) by mouth daily. 30 tablet 1  . predniSONE (DELTASONE) 20 MG tablet Take 2 tablets (40 mg total) by mouth daily for 24 days. 48 tablet 0  . Psyllium (METAMUCIL) 28.3 %  POWD Take 1 Dose by mouth 2 (two) times daily. (Patient taking differently: Take 1 Dose by mouth 4 (four) times daily. Mix in 8 oz water and drink)    . thiamine 100 MG tablet Take 1 tablet (100 mg total) by mouth daily. 30 tablet 1   No current facility-administered medications for this visit.    Allergies  as of 01/05/2021 - Review Complete 12/30/2020  Allergen Reaction Noted  . Hydrocodone Itching 08/26/2015    Family History  Problem Relation Age of Onset  . Diabetes Mother   . Memory loss Mother   . Diabetes Father   . Diabetes Cousin      Physical Exam: General:   Alert, in NAD. Scleral icterus - although this is improving.  Heart:  Regular rate and rhythm; no murmurs Pulm: Clear anteriorly; no wheezing Abdomen:  Protuberent but soft. Nontender. Nondistended. Normal bowel sounds. No rebound or guarding. No fluid wave.  No hepatosplenomegaly. LAD: No inguinal or umbilical LAD Extremities:  2+ LE edema Neurologic:  Alert and  oriented x4;  grossly normal neurologically; no asterixis or clonus. Skin: No jaundice. Mild palmar erythema. Few spider angioma on the chest wall.  No Terry's nails. Psych:  Alert and cooperative. Normal mood and affect.     Destin Kittler L. Tarri Glenn, MD, MPH 01/05/2021, 8:46 AM

## 2021-01-05 NOTE — Patient Instructions (Addendum)
RECOMMENDATION(S):    Please continue to abstain from consumption of alcohol  LABS:    Your provider has requested that you go to the basement level for lab work before leaving today. Press "B" on the elevator. The lab is located at the first door on the left as you exit the elevator.   HEALTHCARE LAWS AND MY CHART RESULTS: Due to recent changes in healthcare laws, you may see the results of your imaging and laboratory studies on MyChart before your provider has had a chance to review them.  We understand that in some cases there may be results that are confusing or concerning to you. Not all laboratory results come back in the same time frame and the provider may be waiting for multiple results in order to interpret others.  Please give [place] 48 hours in order for your provider to thoroughly review all the results before contacting the office for clarification of your results.   PRESCRIPTION MEDICATION(S):    We have sent the following medication(s) to your pharmacy:  . Lasix - please take 40mg  by mouth daily . Aldactone - please take 100mg  by mouth daily  . NOTE: If your medication(s) requires a PRIOR AUTHORIZATION, we will receive notification from your pharmacy. Once received, the process to submit for approval may take up to 7-10 business days. You will be contacted about any denials we have received from your insurance company as well as alternatives recommended by your provider.  FOLLOW UP:   Dr. - Please keep your follow up appointment on Friday, 01/16/21 @ 1:50pm  Tuesday, NP - Please keep your follow up appointment on Friday, 01/09/21 @ 10:30am  FMLA - We have completed your Return to Work Release Form today for you to provide to your employer.  BMI:  If you are age 4 or younger, your body mass index should be between 19-25. Your There is no height or weight on file to calculate BMI. If this is out of the aformentioned range listed, please consider follow up  with your Primary Care Provider.   Thank you for trusting me with your gastrointestinal care!    77, MD, MPH

## 2021-01-06 ENCOUNTER — Telehealth: Payer: Self-pay

## 2021-01-06 ENCOUNTER — Other Ambulatory Visit: Payer: Self-pay

## 2021-01-06 ENCOUNTER — Inpatient Hospital Stay (HOSPITAL_COMMUNITY)
Admission: EM | Admit: 2021-01-06 | Discharge: 2021-01-09 | DRG: 433 | Disposition: A | Discharge status: Home / Self Care | Payer: No Typology Code available for payment source | Attending: Internal Medicine | Admitting: Internal Medicine

## 2021-01-06 ENCOUNTER — Other Ambulatory Visit (INDEPENDENT_AMBULATORY_CARE_PROVIDER_SITE_OTHER): Payer: No Typology Code available for payment source

## 2021-01-06 DIAGNOSIS — K7011 Alcoholic hepatitis with ascites: Secondary | ICD-10-CM

## 2021-01-06 DIAGNOSIS — K7010 Alcoholic hepatitis without ascites: Secondary | ICD-10-CM | POA: Diagnosis present

## 2021-01-06 DIAGNOSIS — F1010 Alcohol abuse, uncomplicated: Secondary | ICD-10-CM | POA: Diagnosis present

## 2021-01-06 DIAGNOSIS — D6959 Other secondary thrombocytopenia: Secondary | ICD-10-CM | POA: Diagnosis present

## 2021-01-06 DIAGNOSIS — R109 Unspecified abdominal pain: Secondary | ICD-10-CM

## 2021-01-06 DIAGNOSIS — K828 Other specified diseases of gallbladder: Secondary | ICD-10-CM | POA: Diagnosis present

## 2021-01-06 DIAGNOSIS — Z79899 Other long term (current) drug therapy: Secondary | ICD-10-CM

## 2021-01-06 DIAGNOSIS — R17 Unspecified jaundice: Secondary | ICD-10-CM

## 2021-01-06 DIAGNOSIS — D696 Thrombocytopenia, unspecified: Secondary | ICD-10-CM

## 2021-01-06 DIAGNOSIS — E669 Obesity, unspecified: Secondary | ICD-10-CM | POA: Diagnosis present

## 2021-01-06 DIAGNOSIS — K7460 Unspecified cirrhosis of liver: Secondary | ICD-10-CM

## 2021-01-06 DIAGNOSIS — Z8616 Personal history of COVID-19: Secondary | ICD-10-CM

## 2021-01-06 DIAGNOSIS — R7989 Other specified abnormal findings of blood chemistry: Secondary | ICD-10-CM | POA: Diagnosis not present

## 2021-01-06 DIAGNOSIS — R188 Other ascites: Secondary | ICD-10-CM

## 2021-01-06 DIAGNOSIS — Z87891 Personal history of nicotine dependence: Secondary | ICD-10-CM

## 2021-01-06 DIAGNOSIS — K7031 Alcoholic cirrhosis of liver with ascites: Secondary | ICD-10-CM | POA: Diagnosis not present

## 2021-01-06 DIAGNOSIS — D638 Anemia in other chronic diseases classified elsewhere: Secondary | ICD-10-CM | POA: Diagnosis present

## 2021-01-06 DIAGNOSIS — K7581 Nonalcoholic steatohepatitis (NASH): Secondary | ICD-10-CM | POA: Diagnosis present

## 2021-01-06 DIAGNOSIS — K766 Portal hypertension: Secondary | ICD-10-CM | POA: Diagnosis present

## 2021-01-06 DIAGNOSIS — K7290 Hepatic failure, unspecified without coma: Secondary | ICD-10-CM

## 2021-01-06 LAB — CBC
HCT: 34.6 % — ABNORMAL LOW (ref 39.0–52.0)
Hemoglobin: 12.2 g/dL — ABNORMAL LOW (ref 13.0–17.0)
MCH: 32.9 pg (ref 26.0–34.0)
MCHC: 35.3 g/dL (ref 30.0–36.0)
MCV: 93.3 fL (ref 80.0–100.0)
Platelets: 47 10*3/uL — ABNORMAL LOW (ref 150–400)
RBC: 3.71 MIL/uL — ABNORMAL LOW (ref 4.22–5.81)
RDW: 20.8 % — ABNORMAL HIGH (ref 11.5–15.5)
WBC: 9.6 10*3/uL (ref 4.0–10.5)
nRBC: 0 % (ref 0.0–0.2)

## 2021-01-06 LAB — LIPASE, BLOOD: Lipase: 51 U/L (ref 11–51)

## 2021-01-06 LAB — URINALYSIS, ROUTINE W REFLEX MICROSCOPIC
Glucose, UA: NEGATIVE mg/dL
Ketones, ur: NEGATIVE mg/dL
Leukocytes,Ua: NEGATIVE
Nitrite: NEGATIVE
Protein, ur: NEGATIVE mg/dL
Specific Gravity, Urine: 1.012 (ref 1.005–1.030)
pH: 6 (ref 5.0–8.0)

## 2021-01-06 LAB — COMPREHENSIVE METABOLIC PANEL
ALT: 60 U/L — ABNORMAL HIGH (ref 0–44)
AST: 153 U/L — ABNORMAL HIGH (ref 15–41)
Albumin: 2.4 g/dL — ABNORMAL LOW (ref 3.5–5.0)
Alkaline Phosphatase: 264 U/L — ABNORMAL HIGH (ref 38–126)
Anion gap: 9 (ref 5–15)
BUN: 10 mg/dL (ref 6–20)
CO2: 22 mmol/L (ref 22–32)
Calcium: 8 mg/dL — ABNORMAL LOW (ref 8.9–10.3)
Chloride: 101 mmol/L (ref 98–111)
Creatinine, Ser: 0.83 mg/dL (ref 0.61–1.24)
GFR, Estimated: >60 mL/min (ref >60)
Glucose, Bld: 95 mg/dL (ref 70–99)
Potassium: 3.9 mmol/L (ref 3.5–5.1)
Sodium: 132 mmol/L — ABNORMAL LOW (ref 135–145)
Total Bilirubin: 15.4 mg/dL — ABNORMAL HIGH (ref 0.3–1.2)
Total Protein: 5.9 g/dL — ABNORMAL LOW (ref 6.5–8.1)

## 2021-01-06 LAB — BILIRUBIN, DIRECT: Bilirubin, Direct: 6.3 mg/dL — ABNORMAL HIGH (ref 0.0–0.3)

## 2021-01-06 NOTE — Telephone Encounter (Signed)
Upon completing chart review after visit from [place] 01/05/21, appears Dr. Tarri Glenn requested Direct Bilirubin be added on for completion. Add on lab order form completed and faxed to [facility]. Confirmation received.

## 2021-01-06 NOTE — ED Triage Notes (Signed)
Pt reports having liver disease, was seen recently for same. Reports dr office calling him and telling him to go to ED due to increase in lab values. Pt appears jaundice at triage. Has swelling to abd and legs and sob.

## 2021-01-07 ENCOUNTER — Observation Stay (HOSPITAL_COMMUNITY): Payer: No Typology Code available for payment source

## 2021-01-07 ENCOUNTER — Emergency Department (HOSPITAL_COMMUNITY): Payer: No Typology Code available for payment source

## 2021-01-07 ENCOUNTER — Inpatient Hospital Stay (HOSPITAL_COMMUNITY): Payer: No Typology Code available for payment source

## 2021-01-07 DIAGNOSIS — K7011 Alcoholic hepatitis with ascites: Secondary | ICD-10-CM | POA: Diagnosis present

## 2021-01-07 DIAGNOSIS — K7290 Hepatic failure, unspecified without coma: Secondary | ICD-10-CM | POA: Diagnosis not present

## 2021-01-07 DIAGNOSIS — D638 Anemia in other chronic diseases classified elsewhere: Secondary | ICD-10-CM | POA: Diagnosis present

## 2021-01-07 DIAGNOSIS — F1010 Alcohol abuse, uncomplicated: Secondary | ICD-10-CM

## 2021-01-07 DIAGNOSIS — D696 Thrombocytopenia, unspecified: Secondary | ICD-10-CM | POA: Diagnosis present

## 2021-01-07 DIAGNOSIS — K7460 Unspecified cirrhosis of liver: Secondary | ICD-10-CM

## 2021-01-07 DIAGNOSIS — R188 Other ascites: Secondary | ICD-10-CM

## 2021-01-07 DIAGNOSIS — K828 Other specified diseases of gallbladder: Secondary | ICD-10-CM | POA: Diagnosis present

## 2021-01-07 DIAGNOSIS — R17 Unspecified jaundice: Secondary | ICD-10-CM | POA: Diagnosis present

## 2021-01-07 DIAGNOSIS — D6959 Other secondary thrombocytopenia: Secondary | ICD-10-CM | POA: Diagnosis present

## 2021-01-07 DIAGNOSIS — K7010 Alcoholic hepatitis without ascites: Secondary | ICD-10-CM

## 2021-01-07 DIAGNOSIS — Z87891 Personal history of nicotine dependence: Secondary | ICD-10-CM | POA: Diagnosis not present

## 2021-01-07 DIAGNOSIS — K766 Portal hypertension: Secondary | ICD-10-CM | POA: Diagnosis present

## 2021-01-07 DIAGNOSIS — Z79899 Other long term (current) drug therapy: Secondary | ICD-10-CM | POA: Diagnosis not present

## 2021-01-07 DIAGNOSIS — E669 Obesity, unspecified: Secondary | ICD-10-CM | POA: Diagnosis present

## 2021-01-07 DIAGNOSIS — Z8616 Personal history of COVID-19: Secondary | ICD-10-CM | POA: Diagnosis not present

## 2021-01-07 DIAGNOSIS — K7581 Nonalcoholic steatohepatitis (NASH): Secondary | ICD-10-CM | POA: Diagnosis present

## 2021-01-07 DIAGNOSIS — K7031 Alcoholic cirrhosis of liver with ascites: Secondary | ICD-10-CM | POA: Diagnosis present

## 2021-01-07 HISTORY — DX: Alcoholic hepatitis without ascites: K70.10

## 2021-01-07 HISTORY — PX: IR PARACENTESIS: IMG2679

## 2021-01-07 HISTORY — DX: Other specified diseases of gallbladder: K82.8

## 2021-01-07 HISTORY — DX: Thrombocytopenia, unspecified: D69.6

## 2021-01-07 HISTORY — DX: Alcohol abuse, uncomplicated: F10.10

## 2021-01-07 LAB — RESP PANEL BY RT-PCR (FLU A&B, COVID) ARPGX2
Influenza A by PCR: NEGATIVE
Influenza B by PCR: NEGATIVE
SARS Coronavirus 2 by RT PCR: NEGATIVE

## 2021-01-07 LAB — CBC WITH DIFFERENTIAL/PLATELET
Abs Immature Granulocytes: 0.08 10*3/uL — ABNORMAL HIGH (ref 0.00–0.07)
Basophils Absolute: 0 10*3/uL (ref 0.0–0.1)
Basophils Relative: 1 %
Eosinophils Absolute: 0.2 10*3/uL (ref 0.0–0.5)
Eosinophils Relative: 3 %
HCT: 32.9 % — ABNORMAL LOW (ref 39.0–52.0)
Hemoglobin: 11.7 g/dL — ABNORMAL LOW (ref 13.0–17.0)
Immature Granulocytes: 1 %
Lymphocytes Relative: 23 %
Lymphs Abs: 1.5 10*3/uL (ref 0.7–4.0)
MCH: 33.3 pg (ref 26.0–34.0)
MCHC: 35.6 g/dL (ref 30.0–36.0)
MCV: 93.7 fL (ref 80.0–100.0)
Monocytes Absolute: 0.8 10*3/uL (ref 0.1–1.0)
Monocytes Relative: 12 %
Neutro Abs: 4 10*3/uL (ref 1.7–7.7)
Neutrophils Relative %: 60 %
Platelets: 43 10*3/uL — ABNORMAL LOW (ref 150–400)
RBC: 3.51 MIL/uL — ABNORMAL LOW (ref 4.22–5.81)
RDW: 21.2 % — ABNORMAL HIGH (ref 11.5–15.5)
WBC: 6.6 10*3/uL (ref 4.0–10.5)
nRBC: 0 % (ref 0.0–0.2)

## 2021-01-07 LAB — ALBUMIN, PLEURAL OR PERITONEAL FLUID: Albumin, Fluid: <1 g/dL

## 2021-01-07 LAB — COMPREHENSIVE METABOLIC PANEL
ALT: 56 U/L — ABNORMAL HIGH (ref 0–44)
AST: 143 U/L — ABNORMAL HIGH (ref 15–41)
Albumin: 2.3 g/dL — ABNORMAL LOW (ref 3.5–5.0)
Alkaline Phosphatase: 213 U/L — ABNORMAL HIGH (ref 38–126)
Anion gap: 9 (ref 5–15)
BUN: 11 mg/dL (ref 6–20)
CO2: 23 mmol/L (ref 22–32)
Calcium: 8.1 mg/dL — ABNORMAL LOW (ref 8.9–10.3)
Chloride: 104 mmol/L (ref 98–111)
Creatinine, Ser: 0.76 mg/dL (ref 0.61–1.24)
GFR, Estimated: >60 mL/min (ref >60)
Glucose, Bld: 92 mg/dL (ref 70–99)
Potassium: 4 mmol/L (ref 3.5–5.1)
Sodium: 136 mmol/L (ref 135–145)
Total Bilirubin: 15.3 mg/dL — ABNORMAL HIGH (ref 0.3–1.2)
Total Protein: 5.7 g/dL — ABNORMAL LOW (ref 6.5–8.1)

## 2021-01-07 LAB — MAGNESIUM: Magnesium: 1.6 mg/dL — ABNORMAL LOW (ref 1.7–2.4)

## 2021-01-07 LAB — PROTEIN, PLEURAL OR PERITONEAL FLUID: Total protein, fluid: <3 g/dL

## 2021-01-07 LAB — BODY FLUID CELL COUNT WITH DIFFERENTIAL
Eos, Fluid: 0 %
Lymphs, Fluid: 17 %
Monocyte-Macrophage-Serous Fluid: 49 % — ABNORMAL LOW (ref 50–90)
Neutrophil Count, Fluid: 34 % — ABNORMAL HIGH (ref 0–25)
Total Nucleated Cell Count, Fluid: 304 cu mm (ref 0–1000)

## 2021-01-07 MED ORDER — THIAMINE HCL 100 MG PO TABS
100.0000 mg | ORAL_TABLET | Freq: Every day | ORAL | Status: DC
  Administered 2021-01-07 – 2021-01-09 (×3): 100 mg via ORAL
  Filled 2021-01-07 (×3): qty 1

## 2021-01-07 MED ORDER — ONDANSETRON HCL 4 MG PO TABS: 4.0000 mg | ORAL_TABLET | Freq: Four times a day (QID) | ORAL | Status: DC | PRN

## 2021-01-07 MED ORDER — ALPRAZOLAM 0.5 MG PO TABS
0.5000 mg | ORAL_TABLET | Freq: Once | ORAL | Status: AC
  Administered 2021-01-07: 0.5 mg via ORAL
  Filled 2021-01-07: qty 1

## 2021-01-07 MED ORDER — FUROSEMIDE 40 MG PO TABS
40.0000 mg | ORAL_TABLET | Freq: Every day | ORAL | Status: DC
  Administered 2021-01-07 – 2021-01-09 (×3): 40 mg via ORAL
  Filled 2021-01-07: qty 1
  Filled 2021-01-07: qty 2
  Filled 2021-01-07: qty 1

## 2021-01-07 MED ORDER — PSYLLIUM 28 % PO PACK: 1.0000 | PACK | Freq: Two times a day (BID) | ORAL | Status: DC

## 2021-01-07 MED ORDER — DICYCLOMINE HCL 10 MG PO CAPS
10.0000 mg | ORAL_CAPSULE | Freq: Three times a day (TID) | ORAL | Status: DC
Start: 2021-01-07 — End: 2021-01-09
  Administered 2021-01-07 – 2021-01-09 (×7): 10 mg via ORAL
  Filled 2021-01-07 (×8): qty 1

## 2021-01-07 MED ORDER — ONDANSETRON HCL 4 MG/2ML IJ SOLN: 4.0000 mg | Freq: Four times a day (QID) | INTRAMUSCULAR | Status: DC | PRN

## 2021-01-07 MED ORDER — FUROSEMIDE 10 MG/ML IJ SOLN
20.0000 mg | Freq: Once | INTRAMUSCULAR | Status: AC
  Administered 2021-01-07: 20 mg via INTRAVENOUS

## 2021-01-07 MED ORDER — LIDOCAINE HCL 1 % IJ SOLN
INTRAMUSCULAR | Status: AC
  Filled 2021-01-07: qty 20

## 2021-01-07 MED ORDER — ALBUMIN HUMAN 25 % IV SOLN
50.0000 g | Freq: Once | INTRAVENOUS | Status: DC
  Filled 2021-01-07: qty 200

## 2021-01-07 MED ORDER — SPIRONOLACTONE 25 MG PO TABS
100.0000 mg | ORAL_TABLET | Freq: Every day | ORAL | Status: DC
  Administered 2021-01-07 – 2021-01-09 (×3): 100 mg via ORAL
  Filled 2021-01-07 (×4): qty 4
  Filled 2021-01-07: qty 1

## 2021-01-07 MED ORDER — GADOBUTROL 1 MMOL/ML IV SOLN
9.0000 mL | Freq: Once | INTRAVENOUS | Status: AC | PRN
  Administered 2021-01-07: 9 mL via INTRAVENOUS

## 2021-01-07 MED ORDER — LIDOCAINE HCL 1 % IJ SOLN
INTRAMUSCULAR | Status: AC | PRN
  Administered 2021-01-07: 10 mL

## 2021-01-07 MED ORDER — PREDNISONE 20 MG PO TABS
40.0000 mg | ORAL_TABLET | Freq: Every day | ORAL | Status: DC
  Administered 2021-01-07: 40 mg via ORAL
  Filled 2021-01-07: qty 2

## 2021-01-07 MED ORDER — FOLIC ACID 1 MG PO TABS
1.0000 mg | ORAL_TABLET | Freq: Every day | ORAL | Status: DC
  Administered 2021-01-07 – 2021-01-09 (×3): 1 mg via ORAL
  Filled 2021-01-07 (×2): qty 1

## 2021-01-07 MED ORDER — PSYLLIUM 95 % PO PACK
1.0000 | PACK | Freq: Two times a day (BID) | ORAL | Status: DC
  Administered 2021-01-07 – 2021-01-09 (×4): 1 via ORAL
  Filled 2021-01-07 (×7): qty 1

## 2021-01-07 MED ORDER — FUROSEMIDE 10 MG/ML IJ SOLN
40.0000 mg | Freq: Once | INTRAMUSCULAR | Status: DC
  Filled 2021-01-07: qty 4

## 2021-01-07 NOTE — H&P (Signed)
History and Physical    Ian Hall JZP:[id number] DOB: Nov 08, 1966 DOA: 01/06/2021  PCP: Maximiano Coss, NP  Patient coming from: Home  I have personally briefly reviewed patient's old medical records in [place]  Chief Complaint: Worsening liver labs  HPI: Ian Hall is a 55 y.o. male with medical history significant of episodic EtOH abuse (binge drinking).  Pt was admitted at end of Jan for acute alcoholic hepatitis.  He last drank over Christmas, no EtOH since then.  He underwent paracentesis and was put on diuretics.  Due to elevated maddrey's score he was started on steroids.  Imaging was also concerning for presence of porcelain gallbladder.  Although no mass was demonstrated, GI remains concerned for cholangiocarcinoma given a recent 40lb unintentional wt loss.  He was of course felt too high risk for cholecystectomy at this time due to acute alcoholic hepatitis.  Yesterday followed up with Dr. Tarri Glenn ([facility]).  Labs drawn.  Liver labs looked significantly worse than on discharge.  Pt sent in to ED for admission as a result.   ED Course: In the ED pt actually doesn't feel too terrible.  Only complains of generalized weakness, dyspnea on exertion.  Does have some increased abdominal distention.  T.bili 15.4, INR 1.7 (yesterday).  Platelets 47.  EDP spoke with Dr. Loletha Carrow: seems they want gentle IV diuresis.  EDP ordered 58m IV lasix, hospitalist asked to admit.   Review of Systems: As per HPI, otherwise all review of systems negative.  Past Medical History:  Diagnosis Date  . Anxiety   . COVID-19     Past Surgical History:  Procedure Laterality Date  . ANKLE SURGERY Right      reports that he quit smoking about 18 years ago. His smoking use included cigarettes. He has never used smokeless tobacco. He reports previous alcohol use. He reports that he does not use drugs.  Allergies  Allergen Reactions  . Hydrocodone Itching    Noted with hydrocodone  cough syrup. Itching only, no hives.  02/03/16: denied itching with recent hydrocodone cough syrup. Only one episode of itching previously.     Family History  Problem Relation Age of Onset  . Diabetes Mother   . Memory loss Mother   . Diabetes Father   . Diabetes Cousin      Prior to Admission medications   Medication Sig Start Date End Date Taking? Authorizing Provider  dicyclomine (BENTYL) 10 MG capsule Take 1 capsule (10 mg total) by mouth 4 (four) times daily -  before meals and at bedtime. 12/16/20  Yes BThornton Park MD  folic acid (FOLVITE) 1 MG tablet Take 1 tablet (1 mg total) by mouth daily. 12/29/20  Yes AShelly Coss MD  furosemide (LASIX) 40 MG tablet Take 1 tablet (40 mg total) by mouth daily. 01/05/21  Yes BThornton Park MD  predniSONE (DELTASONE) 20 MG tablet Take 40 mg by mouth See admin instructions. Qd x 24 days   Yes [provider]  psyllium (METAMUCIL SMOOTH TEXTURE) 28 % packet Take 1 packet by mouth 2 (two) times daily.   Yes [provider]  spironolactone (ALDACTONE) 100 MG tablet Take 1 tablet (100 mg total) by mouth daily. 01/05/21  Yes BThornton Park MD  thiamine 100 MG tablet Take 1 tablet (100 mg total) by mouth daily. Patient not taking: Reported on 01/07/2021 12/29/20   AShelly Coss MD    Physical Exam: Vitals:   01/06/21 1905 01/06/21 2213 01/06/21 2215 01/07/21  0109  BP: (!) 142/90 (!) 149/79 (!) 149/79 (!) 147/81  Pulse: (!) 101 96 96 88  Resp: 18  16 18   Temp: 98.5 F (36.9 C)   98.5 F (36.9 C)  SpO2: 96% 99% 99% 99%    Constitutional: NAD, calm, comfortable Eyes: PERRL, lids and conjunctivae normal ENMT: Mucous membranes are moist. Posterior pharynx clear of any exudate or lesions.Normal dentition.  Neck: normal, supple, no masses, no thyromegaly Respiratory: clear to auscultation bilaterally, no wheezing, no crackles. Normal respiratory effort. No accessory muscle use.  Cardiovascular: Regular rate and  rhythm, no murmurs / rubs / gallops. No extremity edema. 2+ pedal pulses. No carotid bruits.  Abdomen: Distended with some ascites, no TTP, not taut. Musculoskeletal: no clubbing / cyanosis. No joint deformity upper and lower extremities. Good ROM, no contractures. Normal muscle tone.  Skin: no rashes, lesions, ulcers. No induration Neurologic: CN 2-12 grossly intact. Sensation intact, DTR normal. Strength 5/5 in all 4.  Psychiatric: Normal judgment and insight. Alert and oriented x 3. Normal mood.    Labs on Admission: I have personally reviewed following labs and imaging studies  CBC: Recent Labs  Lab 01/05/21 0940 01/06/21 1926  WBC 8.3 9.6  HGB 12.6* 12.2*  HCT 36.5* 34.6*  MCV 95.9 93.3  PLT 50.0 Repeated and verified X2.* 47*   Basic Metabolic Panel: Recent Labs  Lab 01/05/21 0940 01/06/21 1926  NA 135 132*  K 3.8 3.9  CL 106 101  CO2 24 22  GLUCOSE 133* 95  BUN 14 10  CREATININE 0.83 0.83  CALCIUM 8.3* 8.0*   GFR: Estimated Creatinine Clearance: 122.6 mL/min (by C-G formula based on SCr of 0.83 mg/dL). Liver Function Tests: Recent Labs  Lab 01/05/21 0940 01/06/21 1926  AST 170* 153*  ALT 66* 60*  ALKPHOS 263* 264*  BILITOT 14.6* 15.4*  PROT 5.5* 5.9*  ALBUMIN 2.9* 2.4*   Recent Labs  Lab 01/06/21 1926  LIPASE 51   No results for input(s): AMMONIA in the last 168 hours. Coagulation Profile: Recent Labs  Lab 01/05/21 0940  INR 1.7*   Cardiac Enzymes: No results for input(s): CKTOTAL, CKMB, CKMBINDEX, TROPONINI in the last 168 hours. BNP (last 3 results) No results for input(s): PROBNP in the last 8760 hours. HbA1C: No results for input(s): HGBA1C in the last 72 hours. CBG: No results for input(s): GLUCAP in the last 168 hours. Lipid Profile: No results for input(s): CHOL, HDL, LDLCALC, TRIG, CHOLHDL, LDLDIRECT in the last 72 hours. Thyroid Function Tests: No results for input(s): TSH, T4TOTAL, FREET4, T3FREE, THYROIDAB in the last 72  hours. Anemia Panel: No results for input(s): VITAMINB12, FOLATE, FERRITIN, TIBC, IRON, RETICCTPCT in the last 72 hours. Urine analysis:    Component Value Date/Time   COLORURINE AMBER (A) 01/06/2021 1901   APPEARANCEUR CLEAR 01/06/2021 1901   LABSPEC 1.012 01/06/2021 1901   PHURINE 6.0 01/06/2021 1901   GLUCOSEU NEGATIVE 01/06/2021 1901   HGBUR MODERATE (A) 01/06/2021 1901   BILIRUBINUR SMALL (A) 01/06/2021 1901   BILIRUBINUR small (A) 10/17/2020 1627   KETONESUR NEGATIVE 01/06/2021 1901   PROTEINUR NEGATIVE 01/06/2021 1901   UROBILINOGEN 1.0 10/17/2020 1627   NITRITE NEGATIVE 01/06/2021 1901   LEUKOCYTESUR NEGATIVE 01/06/2021 1901    Radiological Exams on Admission: [place] ASCITES (ABDOMEN LIMITED)  Result Date: 01/07/2021 CLINICAL DATA:  Ascites EXAM: LIMITED ABDOMEN ULTRASOUND FOR ASCITES TECHNIQUE: Limited ultrasound survey for ascites was performed in all four abdominal quadrants. COMPARISON:  03/26/2021 FINDINGS: There is a small  to moderate volume of abdominal ascites, primarily in the upper abdomen. The liver is cirrhotic. IMPRESSION: Small to moderate volume of ascites, primarily in the upper abdomen. Electronically Signed   By: Constance Holster M.D.   On: 01/07/2021 01:51   US Abdomen Limited RUQ (LIVER/GB)  Result Date: 01/07/2021 CLINICAL DATA:  Abdominal pain EXAM: ULTRASOUND ABDOMEN LIMITED RIGHT UPPER QUADRANT COMPARISON:  December 27, 2020 FINDINGS: Gallbladder: There is a large gallstone within the gallbladder that limits evaluation. There appears to be some gallbladder wall thickening with the gallbladder wall measuring 4 mm in thickness. The sonographic Percell Miller sign is negative. Common bile duct: Diameter: 5 mm Liver: The liver surface appears nodular suggestive of underlying cirrhosis. Portal vein is patent on color Doppler imaging with normal direction of blood flow towards the liver. Other: Ascites is noted. IMPRESSION: 1. Cholelithiasis with nonspecific gallbladder wall  thickening. This can be seen in the presence of ascites or acute cholecystitis. Consider further evaluation with HIDA scan as clinically indicated. 2. Cirrhotic appearing liver with moderate volume ascites. Electronically Signed   By: Constance Holster M.D.   On: 01/07/2021 01:50    EKG: Independently reviewed.  Assessment/Plan Principal Problem:   Alcoholic hepatitis with ascites Active Problems:   Hepatitis, alcoholic, acute   Porcelain gallbladder   Thrombocytopenia (HCC)   Alcohol abuse, episodic    1. Acute EtOH hepatitis with ascites - 1. Gentle diuresis: got 54m IV lasix in ED 2. Will keep him on 439mPO daily and aldactone for the moment, defer need for further IV lasix to GI when they see him in consult in hospital 3. Repeat CMP in AM 4. Continue steroids 5. US[place] shows small to moderate ascites 6. Bigger concern to me is the worsening of his liver tests (Bili and INR and platelets). 7. GI consulted by EDP. 8. Will get SW consult re: FMLA vs disability options / discussion. 2. EtOH abuse - 1. Cont abstinence, pt reports no EtOH since christmas. 2. Cont folate and thiamine 3. Porcelain gallbladder - 1. A pre-cancerous condition at a minimum. 2. If we can get him through the EtOH hepatitis, then plan to talk with gen surg about cholecystectomy.  Too high risk at the moment with acute hepatitis though. 3. See also GI note 4. No visible mass on imaging 5. But high index of suspicion given Wt loss.  DVT prophylaxis: SCDs Code Status: Full Family Communication: No family in room Disposition Plan: Home after hepatitis stabilized Consults called: EDP spoke with Dr. DaLoletha Carrowdmission status: Place in obs    Illya Gienger, JAAustinospitalists  How to contact the [facility]ending or Consulting provider 7AWarsawr covering provider during after hours 7PBenzoniafor this patient?  1. Check the care team in [facility] look for a) attending/consulting TRH provider listed and b)  the TRFlambeau Hsptleam listed 2. Log into [email or web address]  Amion Physician Scheduling and messaging for groups and whole hospitals  On call and physician scheduling software for group practices, residents, hospitalists and other medical providers for call, clinic, rotation and shift schedules. OnCall [employer] is a hospital-wide system for scheduling doctors and paging doctors on call. EasyPlot is for scientific plotting and data analysis.  [email or web address]  and use [place]'s universal password to access. If you do not have the password, please contact the hospital operator.  3. Locate the TRMarie [facility] Center - P H Frovider you are looking for under [facility] and page to a number that you can be directly  reached. 4. If you still have difficulty reaching the provider, please page the [facility] (Director on Call) for the Hospitalists listed on amion for assistance.  01/07/2021, 3:23 AM

## 2021-01-07 NOTE — ED Provider Notes (Signed)
Emergency Department Provider Note  I have reviewed the triage vital signs and the nursing notes.  HISTORY  Chief Complaint Abdominal Pain, Shortness of Breath, and Jaundice   HPI Ian Hall is a 55 y.o. male w/ h/o alcoholic hepatitis, recovering but then had some alcohol again and now with acute worsening of liver enzymes (seen by Dr. Tarri Glenn) to include bilirubin and direct bilirubin. Also states worsening DOE, distension and abdominal discomfort for same. LE edema as well. Apparently someone from [place] called and told him to come to the ED for eval.    Review of records does not offer any hints.   No other associated or modifying symptoms.    Past Medical History:  Diagnosis Date  . Anxiety   . COVID-19     Patient Active Problem List   Diagnosis Date Noted  . Alcoholic hepatitis with ascites 12/26/2020  . Hepatitis, alcoholic, acute 54/00/8676  . 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 ANKLE SURGERY Right     Current Outpatient Rx  . Order #: [id number] Class: Normal  . Order #: [id number] Class: Normal  . Order #: [id number] Class: Normal  . Order #: [id number] Class: Historical Med  . Order #: [id number] Class: Normal  . Order #: [id number] Class: Normal    Allergies Hydrocodone  Family History  Problem Relation Age of Onset  . Diabetes Mother   . Memory loss Mother   . Diabetes Father   . Diabetes Cousin     Social History Social History   Tobacco Use  . Smoking status: Former Smoker    Types: Cigarettes    Quit date: 12/16/2002    Years since quitting: 18.0  . Smokeless tobacco: Never Used  Vaping Use  . Vaping Use: Never used  Substance Use Topics  . Alcohol use: Not Currently    Comment: none for 3-4 years (stated 12/16/2020)  . Drug use: No    Review of Systems  All other systems negative except as documented in the HPI. All pertinent positives and negatives as reviewed in the  HPI. ____________________________________________  PHYSICAL EXAM:  VITAL SIGNS: ED Triage Vitals  Enc Vitals Group     BP 01/06/21 1905 (!) 142/90     Pulse Rate 01/06/21 1905 (!) 101     Resp 01/06/21 1905 18     Temp 01/06/21 1905 98.5 F (36.9 C)     Temp src --      SpO2 01/06/21 1905 96 %    Constitutional: Alert and oriented. Well appearing and in no acute distress. Eyes: scleral icterus. PERRL. EOMI. Head: Atraumatic. Nose: No congestion/rhinnorhea. Mouth/Throat: Mucous membranes are moist.  Oropharynx non-erythematous. Neck: No stridor.  No meningeal signs.   Cardiovascular: tachycardic rate, regular rhythm. Good peripheral circulation. Grossly normal heart sounds.   Respiratory: Normal respiratory effort.  No retractions. Lungs CTAB. Gastrointestinal: Soft and nontender. No distention.  Musculoskeletal: No lower extremity tenderness nor edema. No gross deformities of extremities. Neurologic:  Normal speech and language. No gross focal neurologic deficits are appreciated.  Skin:  Skin is warm, dry and intact. No rash noted. Jaundice.   ____________________________________________   LABS (all labs ordered are listed, but only abnormal results are displayed)  Labs Reviewed  COMPREHENSIVE METABOLIC PANEL - Abnormal; Notable for the following components:      Result Value   Sodium 132 (*)    Calcium 8.0 (*)    Total Protein 5.9 (*)  Albumin 2.4 (*)    AST 153 (*)    ALT 60 (*)    Alkaline Phosphatase 264 (*)    Total Bilirubin 15.4 (*)    All other components within normal limits  CBC - Abnormal; Notable for the following components:   RBC 3.71 (*)    Hemoglobin 12.2 (*)    HCT 34.6 (*)    RDW 20.8 (*)    Platelets 47 (*)    All other components within normal limits  URINALYSIS, ROUTINE W REFLEX MICROSCOPIC - Abnormal; Notable for the following components:   Color, Urine AMBER (*)    Hgb urine dipstick MODERATE (*)    Bilirubin Urine SMALL (*)     Bacteria, UA RARE (*)    All other components within normal limits  LIPASE, BLOOD   ____________________________________________  EKG   EKG Interpretation  Date/Time:  Tuesday January 06 2021 19:07:05 EST Ventricular Rate:  100 PR Interval:  112 QRS Duration: 82 QT Interval:  326 QTC Calculation: 420 R Axis:   23 Text Interpretation: Normal sinus rhythm Minimal voltage criteria for LVH, may be normal variant ( R in aVL ) Inferior infarct , age undetermined Anterolateral infarct , age undetermined Abnormal ECG When compared with ECG of 05/22/2003, No significant change was found Confirmed by Delora Fuel ([id number]) on 01/06/2021 11:26:00 PM       ____________________________________________  RADIOLOGY  No results found. ____________________________________________  PROCEDURES  Procedure(s) performed:   Procedures ____________________________________________  INITIAL IMPRESSION / ASSESSMENT AND PLAN    This patient presents to the ED for concern of elevated bilirubin, this involves an extensive number of treatment options, and is a complaint that carries with it a high risk of complications and morbidity.  The differential diagnosis includes decompnesated cirrhosis, cancer.   Additional history obtained:   Additional history obtained from noone  Previous records obtained and reviewed in epic  ED Course      Medicines ordered:   Medications - No data to display  Consultations Obtained:   I consulted Dr Loletha Carrow with GI  and discussed lab and imaging findings, recommends admission, gentle diuresis and they will see him in AM.    CRITICAL INTERVENTIONS:  . none  Reevaluation:  After the interventions stated above, I reevaluated the patient and found same  FINAL IMPRESSION AND PLAN Final diagnoses:  None   Medical screening exam was performed and I feel the patient has had appropriate emergency department evaluation and work-up for their chief complaint  and is stable for ADMISSION to the hospitalist time.  I discussed with Dr. Alcario Drought with the hospitalist service and discussed labs, imaging and other work-up in the emergency room.  They agree to admission for further management and work-up of said condition.   ____________________________________________   NEW OUTPATIENT MEDICATIONS STARTED DURING THIS VISIT:  New Prescriptions   No medications on file    Note:  This note was prepared with assistance of Dragon voice recognition software. Occasional wrong-word or sound-a-like substitutions may have occurred due to the inherent limitations of voice recognition software.   Merrily Pew, MD 01/09/21 [phone]

## 2021-01-07 NOTE — ED Provider Notes (Signed)
[facility] EMERGENCY DEPARTMENT Provider Note   CSN: [id number] Arrival date & time: 01/06/21  1827     History Chief Complaint  Patient presents with  . Abdominal Pain  . Shortness of Breath  . Jaundice    Ian Hall is a 55 y.o. male.  55 yo M with a complicated hepatic history.  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  He now has acute hepatitis on top of that and has worsening peripheral edema with a 40 pound weight gain.  Also has worsening dyspnea on exertion and generalized weakness.  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  Soundly things got worse since then.  He had labs checked yesterday had an appoint with Dr. Tarri Glenn showing worsening bilirubin, platelets are going lower INR is going up concern for decompensated cirrhosis.  It is not documented however the patient states that Dr. Tarri Glenn office called him and told him to come to the hospital to be admitted for decompensated cirrhosis.  Patient's only complaint is weakness and dyspnea on exertion with some abdominal distention.   Abdominal Pain Associated symptoms: shortness of breath   Shortness of Breath Associated symptoms: abdominal pain        Past Medical History:  Diagnosis Date  . Anxiety   . COVID-19     Patient Active Problem List   Diagnosis Date Noted  . Alcoholic hepatitis with ascites 12/26/2020  . Hepatitis, alcoholic, acute 32/35/5732  . 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 ANKLE SURGERY Right        Family History  Problem Relation Age of Onset  . Diabetes Mother   . Memory loss Mother   . Diabetes Father   .  Diabetes Cousin     Social History   Tobacco Use  . Smoking status: Former Smoker    Types: Cigarettes    Quit date: 12/16/2002    Years since quitting: 18.0  . Smokeless tobacco: Never Used  Vaping Use  . Vaping Use: Never used  Substance Use Topics  . Alcohol use: Not Currently    Comment: none for 3-4 years (stated 12/16/2020)  . Drug use: No    Home Medications Prior to Admission medications   Medication Sig Start Date End Date Taking? Authorizing Provider  dicyclomine (BENTYL) 10 MG capsule Take 1 capsule (10 mg total) by mouth 4 (four) times daily -  before meals and at bedtime. 12/16/20   Thornton Park, MD  folic acid (FOLVITE) 1 MG tablet Take 1 tablet (1 mg total) by mouth daily. 12/29/20   Shelly Coss, MD  furosemide (LASIX) 40 MG tablet Take 1 tablet (40 mg total) by mouth daily. 01/05/21   Thornton Park, MD  psyllium (METAMUCIL SMOOTH TEXTURE) 28 % packet Take 1 packet by mouth 2 (two) times daily.    [provider]  spironolactone (ALDACTONE) 100 MG tablet Take 1 tablet (100 mg total) by mouth daily. 01/05/21   Thornton Park, MD  thiamine 100 MG tablet Take 1 tablet (100 mg total) by mouth daily. 12/29/20   Shelly Coss, MD    Allergies    Hydrocodone  Review of Systems   Review  of Systems  Respiratory: Positive for shortness of breath.   Gastrointestinal: Positive for abdominal pain.  All other systems reviewed and are negative.   Physical Exam Updated Vital Signs BP (!) 147/81   Pulse 88   Temp 98.5 F (36.9 C)   Resp 18   SpO2 99%   Physical Exam Vitals and nursing note reviewed.  Constitutional:      Appearance: He is well-developed and well-nourished.  HENT:     Head: Normocephalic and atraumatic.     Nose: Nose normal. No congestion or rhinorrhea.     Mouth/Throat:     Mouth: Mucous membranes are moist.     Pharynx: Oropharynx is clear.  Eyes:     Pupils: Pupils are equal, round, and reactive to light.   Cardiovascular:     Rate and Rhythm: Normal rate.  Pulmonary:     Effort: Pulmonary effort is normal. No respiratory distress.  Abdominal:     General: There is distension.     Tenderness: There is no abdominal tenderness.  Musculoskeletal:        General: Normal range of motion.     Cervical back: Normal range of motion.  Skin:    General: Skin is warm and dry.     Coloration: Skin is jaundiced.  Neurological:     General: No focal deficit present.     Mental Status: He is alert.     ED Results / Procedures / Treatments   Labs (all labs ordered are listed, but only abnormal results are displayed) Labs Reviewed  COMPREHENSIVE METABOLIC PANEL - Abnormal; Notable for the following components:      Result Value   Sodium 132 (*)    Calcium 8.0 (*)    Total Protein 5.9 (*)    Albumin 2.4 (*)    AST 153 (*)    ALT 60 (*)    Alkaline Phosphatase 264 (*)    Total Bilirubin 15.4 (*)    All other components within normal limits  CBC - Abnormal; Notable for the following components:   RBC 3.71 (*)    Hemoglobin 12.2 (*)    HCT 34.6 (*)    RDW 20.8 (*)    Platelets 47 (*)    All other components within normal limits  URINALYSIS, ROUTINE W REFLEX MICROSCOPIC - Abnormal; Notable for the following components:   Color, Urine AMBER (*)    Hgb urine dipstick MODERATE (*)    Bilirubin Urine SMALL (*)    Bacteria, UA RARE (*)    All other components within normal limits  RESP PANEL BY RT-PCR (FLU A&B, COVID) ARPGX2  LIPASE, BLOOD    EKG EKG Interpretation  Date/Time:  Tuesday January 06 2021 19:07:05 EST Ventricular Rate:  100 PR Interval:  112 QRS Duration: 82 QT Interval:  326 QTC Calculation: 420 R Axis:   23 Text Interpretation: Normal sinus rhythm Minimal voltage criteria for LVH, may be normal variant ( R in aVL ) Inferior infarct , age undetermined Anterolateral infarct , age undetermined Abnormal ECG When compared with ECG of 05/22/2003, No significant change was  found Confirmed by Delora Fuel ([id number]) on 01/06/2021 11:26:00 PM   Radiology No results found.  Procedures Procedures   Medications Ordered in ED Medications  furosemide (LASIX) injection 20 mg (has no administration in time range)    ED Course  I have reviewed the triage vital signs and the nursing notes.  Pertinent labs & imaging results that were available during my  care of the patient were reviewed by me and considered in my medical decision making (see chart for details).    MDM Rules/Calculators/A&P                          Patient is here with decompensated cirrhosis.  Overall patient appears well does not appear to be in distress.  There is unclear as to why the patient was sent to the hospital however after speaking with gastroenterology, Dr. Loletha Carrow, the became apparent that he is here for IV diuresis in the setting of worsening liver failure.  I specifically asked if he needed to be transferred for consideration of liver transplant however he stated that they could handle it here and asked me to admit him to get imaging and diuresis.  Discussed with hospitalist who will admit for same  Final Clinical Impression(s) / ED Diagnoses Final diagnoses:  Abdominal pain  Hyperbilirubinemia  Jaundice  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[place])    Rx / DC Orders ED Discharge Orders    None       Siren Porrata, Corene Cornea, MD 01/07/21 [phone]

## 2021-01-07 NOTE — Progress Notes (Signed)
RN has been attempting to get a hold of GI all day to see if they still want the albumin adminstered for this patient still no call back. I have called the opffice 3x

## 2021-01-07 NOTE — ED Notes (Signed)
Breakfast order placed ?

## 2021-01-07 NOTE — Progress Notes (Signed)
Patient ID: Ian Hall, male   DOB: 08-16-1966, 55 y.o.   MRN: [id number] Patient admitted early this morning for worsening liver labs.  Patient seen and examined at bedside and plan of care discussed with him.  I have reviewed patient medical records including this morning's H&P, current vitals, labs, medications myself.  Awaiting GI consultation.  Repeat a.m. labs.

## 2021-01-07 NOTE — Procedures (Signed)
PROCEDURE SUMMARY:  Successful US guided paracentesis from right lateral abdomen.  Yielded 4.0 liters of bright yellow fluid.  No immediate complications.  Pt tolerated well.   Specimen was sent for labs.  EBL < 4m  KDocia BarrierPA-C 01/07/2021 1:23 PM

## 2021-01-07 NOTE — ED Notes (Signed)
Lunch Tray Ordered @ 1046. 

## 2021-01-07 NOTE — ED Notes (Signed)
Patient transported to Ultrasound 

## 2021-01-07 NOTE — Consult Note (Signed)
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 Sent by office to ED for progressive hyperbilirubinemia. Renal function is okay. Mental status normal. --Etiology of progressive hyperbilirubinemia unclear. He recently took course of Cipro which can uncommonly cause cholestatic liver injury. Doubt extrahepatic biliary obstruction. He has ascites, will arrange for LVP to rule out SBP especially since he recently had colitis.  No evidence for [facility] on CT scan with contrast in late January. He isn't drinking Etoh but is still on a prednisone taper for etoh hepatitis?  --INR stable at 1.7.  --No varices on EGD a few days ago. --[facility] surveillance ongoing by Dr. Tarri Glenn    # Recent Colitis on CT scan Stool path panel positive for Enteroaggregative E coli. Treated with cipro. BMs still not back to normal. Had ongoing loose stool several times a day. 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   # Mild normocytic anemia. Hgb 11. 7 at baseline     HPI:                                                                                                                             Chief Complaint: abdominal pain   Ian Hall is a 55 y.o. male with recently diagnosed cirrhosis, likely secondary to [place].  He is followed by Dr. Tarri Glenn in our office.  Patient was seen in the office 2 days ago with complaints of abdominal pain, abdominal distention, edema and early satiety.  Patient has a history of alcohol use but has not drank since Christmas.  Labs at our office  remarkable for significant jump in bilirubin from 8.7 in late January to 14.6.  Direct bilirubin 6.3, follow-up liver chemistries yesterday showed progressive hyperbilirubinemia with total bilirubin of 15.4.  Patient was sent to the ED for evaluation.   Patient describes ongoing loose stool several times a day despite treatment for infectious colitis the end of January. He had been having abdominal pain , mainly LUQ and left mid abdominal for several days. Pain mainly occurs when standing or moving around. He feels better when lying still. No fevers.   PREVIOUS ENDOSCOPIC EVALUATIONS / PERTINENT STUDIES   12/30/2020 EGD --Food in stomach, no varices or portal hypertensive gastropathy  12/30/2020 colonoscopy - A few diverticula were found in the sigmoid colon and descending colon. - Extensive amounts of solid stool was found in the entire colon. Evaluation  for polyps could not be performed. - A patchy area of mildly erythematous mucosa was found in the ascending colon and proximal transverse colon. Biopsies were taken with a cold forceps for histology. Estimated blood loss was minimal. - The exam was otherwise without abnormality on direct and retroflexion views.  Surgical [P], random gastric bx - REACTIVE GASTROPATHY WITH EROSIONS. [name] IS NEGATIVE FOR HELICOBACTER PYLORI. - NO INTESTINAL METAPLASIA, DYSPLASIA, OR MALIGNANCY. 2. Surgical [P], random esophageal bx - REFLUX CHANGES. - NO INTESTINAL METAPLASIA, DYSPLASIA, OR MALIGNANCY. 3. Surgical [P], right colon bx - BENIGN COLONIC MUCOSA. - NO ACTIVE INFLAMMATION OR EVIDENCE OF MICROSCOPIC COLITIS. - NO DYSPLASIA OR MALIGNANCY.   12/24/20 CT scan w/ contrast IMPRESSION: 1. Cirrhosis with moderate amount ascites and splenomegaly. Findings are compatible with portal hypertension. 2. Wall thickening involving the ascending colon and hepatic flexure. Some of the wall thickening could be associated with the adjacent ascites  but also concerning for colitis involving the right colon. 3. Subtle patchy densities in the right middle lobe could represent mild infection or inflammation. 4. Extensive calcifications involving the gallbladder. 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 There may be gallstones near the gallbladder base.    Past Medical History:  Diagnosis Date  . Anxiety   . COVID-19     Past Surgical History:  Procedure Laterality Date  . ANKLE SURGERY Right     Prior to Admission medications   Medication Sig Start Date End Date Taking? Authorizing Provider  dicyclomine (BENTYL) 10 MG capsule Take 1 capsule (10 mg total) by mouth 4 (four) times daily -  before meals and at bedtime. 12/16/20  Yes Thornton Park, MD  folic acid (FOLVITE) 1 MG tablet Take 1 tablet (1 mg total) by mouth daily. 12/29/20  Yes Shelly Coss, MD  furosemide (LASIX) 40 MG tablet Take 1 tablet (40 mg total) by mouth daily. 01/05/21  Yes Thornton Park, MD  predniSONE (DELTASONE) 20 MG tablet Take 40 mg by mouth See admin instructions. Qd x 24 days   Yes [provider]  psyllium (METAMUCIL SMOOTH TEXTURE) 28 % packet Take 1 packet by mouth 2 (two) times daily.   Yes [provider]  spironolactone (ALDACTONE) 100 MG tablet Take 1 tablet (100 mg total) by mouth daily. 01/05/21  Yes Thornton Park, MD  thiamine 100 MG tablet Take 1 tablet (100 mg total) by mouth daily. Patient not taking: Reported on 01/07/2021 12/29/20   Shelly Coss, MD    Current Facility-Administered Medications  Medication Dose Route Frequency Provider Last Rate Last Admin  . dicyclomine (BENTYL) capsule 10 mg  10 mg Oral TID AC & HS Etta Quill, DO      . folic acid (FOLVITE) tablet 1 mg  1 mg Oral Daily Alcario Drought, Jared M, DO      . furosemide (LASIX) tablet 40 mg  40 mg Oral Daily Alcario Drought, Jared M, DO      . ondansetron [facility]) tablet 4  mg  4 mg Oral Q6H PRN Etta Quill, DO       Or  . ondansetron [facility]) injection 4 mg  4 mg Intravenous Q6H PRN Etta Quill, DO      . predniSONE (DELTASONE) tablet 40 mg  40 mg Oral QAC breakfast Jennette Kettle M, DO      . psyllium (HYDROCIL/METAMUCIL) 1 packet  1 packet Oral BID Etta Quill, DO      .  spironolactone (ALDACTONE) tablet 100 mg  100 mg Oral Daily Alcario Drought, Jared M, DO      . thiamine tablet 100 mg  100 mg Oral Daily Etta Quill, DO       Current Outpatient Medications  Medication Sig Dispense Refill  . dicyclomine (BENTYL) 10 MG capsule Take 1 capsule (10 mg total) by mouth 4 (four) times daily -  before meals and at bedtime. 834 capsule 2  . folic acid (FOLVITE) 1 MG tablet Take 1 tablet (1 mg total) by mouth daily. 30 tablet 1  . furosemide (LASIX) 40 MG tablet Take 1 tablet (40 mg total) by mouth daily. 90 tablet 0  . predniSONE (DELTASONE) 20 MG tablet Take 40 mg by mouth See admin instructions. Qd x 24 days    . psyllium (METAMUCIL SMOOTH TEXTURE) 28 % packet Take 1 packet by mouth 2 (two) times daily.    [name] spironolactone (ALDACTONE) 100 MG tablet Take 1 tablet (100 mg total) by mouth daily. 90 tablet 0  . thiamine 100 MG tablet Take 1 tablet (100 mg total) by mouth daily. (Patient not taking: Reported on 01/07/2021) 30 tablet 1    Allergies as of 01/06/2021 - Review Complete 01/06/2021  Allergen Reaction Noted  . Hydrocodone Itching 08/26/2015    Family History  Problem Relation Age of Onset  . Diabetes Mother   . Memory loss Mother   . Diabetes Father   . Diabetes Cousin     Social History   Socioeconomic History  . Marital status: Married    Spouse name: Not on file  . Number of children: 0  . Years of education: Not on file  . Highest education level: Not on file  Occupational History  . Occupation: IT sales professional: [employer]  Tobacco Use  . Smoking status: Former Smoker    Types: Cigarettes    Quit date:  12/16/2002    Years since quitting: 18.0  . Smokeless tobacco: Never Used  Vaping Use  . Vaping Use: Never used  Substance and Sexual Activity  . Alcohol use: Not Currently    Comment: none for 3-4 years (stated 12/16/2020)  . Drug use: No  . Sexual activity: Yes    Birth control/protection: None  Other Topics Concern  . Not on file  Social History Narrative  . Not on file   Social Determinants of Health   Financial Resource Strain: Not on file  Food Insecurity: Not on file  Transportation Needs: Not on file  Physical Activity: Not on file  Stress: Not on file  Social Connections: Not on file  Intimate Partner Violence: Not on file    Review of Systems: All systems reviewed and negative except where noted in HPI.  OBJECTIVE:    Physical Exam: Vital signs in last 24 hours: Temp:  [98.2 F (36.8 C)-98.5 F (36.9 C)] 98.2 F (36.8 C) (02/09 0814) Pulse Rate:  [83-107] 83 (02/09 0814) Resp:  [16-20] 16 (02/09 0814) BP: (106-149)/(65-90) 133/87 (02/09 0814) SpO2:  [96 %-99 %] 97 % (02/09 1962)   General:   Alert  male in NAD Psych:  Pleasant, cooperative. Normal mood and affect. Eyes:  Pupils equal, icteric sclerae  Ears:  Normal auditory acuity. Nose:  No deformity, discharge,  or lesions. Neck:  Supple; no masses Lungs:  Clear throughout to auscultation.   No wheezes, crackles, or rhonchi.  Heart:  Regular rate and rhythm;BLE pitting edema Abdomen:  Soft, non-distended, mild diffuse tenderness,  BS active, no palp mass   Rectal:  Deferred  Msk:  Symmetrical without gross deformities. . Neurologic:  Alert and  oriented x4;  grossly normal neurologically. No asterixis.  Skin:  Intact without significant lesions or rashes.   Scheduled inpatient medications . dicyclomine  10 mg Oral TID AC & HS  . folic acid  1 mg Oral Daily  . furosemide  40 mg Oral Daily  . predniSONE  40 mg Oral QAC breakfast  . psyllium  1 packet Oral BID  . spironolactone  100 mg Oral Daily   . thiamine  100 mg Oral Daily      Intake/Output from previous day: 02/08 0701 - 02/09 0700 In: -  Out: 750 [Urine:750] Intake/Output this shift: No intake/output data recorded.   Lab Results: Recent Labs    01/05/21 0940 01/06/21 1926 01/07/21 0804  WBC 8.3 9.6 6.6  HGB 12.6* 12.2* 11.7*  HCT 36.5* 34.6* 32.9*  PLT 50.0 Repeated and verified X2.* 47* 43*   BMET Recent Labs    01/05/21 0940 01/06/21 1926 01/07/21 0804  NA 135 132* 136  K 3.8 3.9 4.0  CL 106 101 104  CO2 24 22 23   GLUCOSE 133* 95 92  BUN 14 10 11   CREATININE 0.83 0.83 0.76  CALCIUM 8.3* 8.0* 8.1*   LFT Recent Labs    01/06/21 1449 01/06/21 1926 01/07/21 0804  PROT  --    < > 5.7*  ALBUMIN  --    < > 2.3*  AST  --    < > 143*  ALT  --    < > 56*  ALKPHOS  --    < > 213*  BILITOT  --    < > 15.3*  BILIDIR 6.3*  --   --    < > = values in this interval not displayed.   PT/INR Recent Labs    01/05/21 0940  LABPROT 19.4*  INR 1.7*   Hepatitis Panel No results for input(s): HEPBSAG, HCVAB, HEPAIGM, HEPBIGM in the last 72 hours.   . CBC Latest Ref Rng & Units 01/07/2021 01/06/2021 01/05/2021  WBC 4.0 - 10.5 K/uL 6.6 9.6 8.3  Hemoglobin 13.0 - 17.0 g/dL 11.7(L) 12.2(L) 12.6(L)  Hematocrit 39.0 - 52.0 % 32.9(L) 34.6(L) 36.5(L)  Platelets 150 - 400 K/uL 43(L) 47(L) 50.0 Repeated and verified X2.(L)    . CMP Latest Ref Rng & Units 01/07/2021 01/06/2021 01/05/2021  Glucose 70 - 99 mg/dL 92 95 133(H)  BUN 6 - 20 mg/dL 11 10 14   Creatinine 0.61 - 1.24 mg/dL 0.76 0.83 0.83  Sodium 135 - 145 mmol/L 136 132(L) 135  Potassium 3.5 - 5.1 mmol/L 4.0 3.9 3.8  Chloride 98 - 111 mmol/L 104 101 106  CO2 22 - 32 mmol/L 23 22 24   Calcium 8.9 - 10.3 mg/dL 8.1(L) 8.0(L) 8.3(L)  Total Protein 6.5 - 8.1 g/dL 5.7(L) 5.9(L) 5.5(L)  Total Bilirubin 0.3 - 1.2 mg/dL 15.3(H) 15.4(H) 14.6(H)  Alkaline Phos 38 - 126 U/L 213(H) 264(H) 263(H)  AST 15 - 41 U/L 143(H) 153(H) 170(H)  ALT 0 - 44 U/L 56(H) 60(H) 66(H)    Studies/Results: [place] ASCITES (ABDOMEN LIMITED)  Result Date: 01/07/2021 CLINICAL DATA:  Ascites EXAM: LIMITED ABDOMEN ULTRASOUND FOR ASCITES TECHNIQUE: Limited ultrasound survey for ascites was performed in all four abdominal quadrants. COMPARISON:  03/26/2021 FINDINGS: There is a small to moderate volume of abdominal ascites, primarily in the upper abdomen. The liver is cirrhotic. IMPRESSION: Small to moderate  volume of ascites, primarily in the upper abdomen. Electronically Signed   By: Constance Holster M.D.   On: 01/07/2021 01:51   US Abdomen Limited RUQ (LIVER/GB)  Result Date: 01/07/2021 CLINICAL DATA:  Abdominal pain EXAM: ULTRASOUND ABDOMEN LIMITED RIGHT UPPER QUADRANT COMPARISON:  December 27, 2020 FINDINGS: Gallbladder: There is a large gallstone within the gallbladder that limits evaluation. There appears to be some gallbladder wall thickening with the gallbladder wall measuring 4 mm in thickness. The sonographic Percell Miller sign is negative. Common bile duct: Diameter: 5 mm Liver: The liver surface appears nodular suggestive of underlying cirrhosis. Portal vein is patent on color Doppler imaging with normal direction of blood flow towards the liver. Other: Ascites is noted. IMPRESSION: 1. Cholelithiasis with nonspecific gallbladder wall thickening. This can be seen in the presence of ascites or acute cholecystitis. Consider further evaluation with HIDA scan as clinically indicated. 2. Cirrhotic appearing liver with moderate volume ascites. Electronically Signed   By: Constance Holster M.D.   On: 01/07/2021 01:50    Principal Problem:   Alcoholic hepatitis with ascites Active Problems:   Hepatitis, alcoholic, acute   Porcelain gallbladder   Thrombocytopenia ([place])   Alcohol abuse, episodic    Tye Savoy, NP-C @  01/07/2021, 9:42 AM

## 2021-01-08 DIAGNOSIS — R17 Unspecified jaundice: Secondary | ICD-10-CM

## 2021-01-08 LAB — CBC WITH DIFFERENTIAL/PLATELET
Abs Immature Granulocytes: 0.04 10*3/uL (ref 0.00–0.07)
Basophils Absolute: 0 10*3/uL (ref 0.0–0.1)
Basophils Relative: 0 %
Eosinophils Absolute: 0 10*3/uL (ref 0.0–0.5)
Eosinophils Relative: 0 %
HCT: 30.5 % — ABNORMAL LOW (ref 39.0–52.0)
Hemoglobin: 10.9 g/dL — ABNORMAL LOW (ref 13.0–17.0)
Immature Granulocytes: 1 %
Lymphocytes Relative: 12 %
Lymphs Abs: 0.9 10*3/uL (ref 0.7–4.0)
MCH: 32.9 pg (ref 26.0–34.0)
MCHC: 35.7 g/dL (ref 30.0–36.0)
MCV: 92.1 fL (ref 80.0–100.0)
Monocytes Absolute: 0.7 10*3/uL (ref 0.1–1.0)
Monocytes Relative: 10 %
Neutro Abs: 6 10*3/uL (ref 1.7–7.7)
Neutrophils Relative %: 77 %
Platelets: 41 10*3/uL — ABNORMAL LOW (ref 150–400)
RBC: 3.31 MIL/uL — ABNORMAL LOW (ref 4.22–5.81)
RDW: 20.3 % — ABNORMAL HIGH (ref 11.5–15.5)
WBC: 7.7 10*3/uL (ref 4.0–10.5)
nRBC: 0 % (ref 0.0–0.2)

## 2021-01-08 LAB — HCV RNA QUANT: HCV Quantitative: NOT DETECTED IU/mL (ref >50)

## 2021-01-08 LAB — COMPREHENSIVE METABOLIC PANEL
ALT: 45 U/L — ABNORMAL HIGH (ref 0–44)
AST: 119 U/L — ABNORMAL HIGH (ref 15–41)
Albumin: 2 g/dL — ABNORMAL LOW (ref 3.5–5.0)
Alkaline Phosphatase: 231 U/L — ABNORMAL HIGH (ref 38–126)
Anion gap: 8 (ref 5–15)
BUN: 15 mg/dL (ref 6–20)
CO2: 24 mmol/L (ref 22–32)
Calcium: 8 mg/dL — ABNORMAL LOW (ref 8.9–10.3)
Chloride: 103 mmol/L (ref 98–111)
Creatinine, Ser: 0.94 mg/dL (ref 0.61–1.24)
GFR, Estimated: >60 mL/min (ref >60)
Glucose, Bld: 113 mg/dL — ABNORMAL HIGH (ref 70–99)
Potassium: 4.4 mmol/L (ref 3.5–5.1)
Sodium: 135 mmol/L (ref 135–145)
Total Bilirubin: 12.9 mg/dL — ABNORMAL HIGH (ref 0.3–1.2)
Total Protein: 5 g/dL — ABNORMAL LOW (ref 6.5–8.1)

## 2021-01-08 LAB — MAGNESIUM: Magnesium: 1.8 mg/dL (ref 1.7–2.4)

## 2021-01-08 LAB — GRAM STAIN

## 2021-01-08 LAB — CYTOLOGY - NON PAP

## 2021-01-08 NOTE — Progress Notes (Signed)
Patient ID: Ian Hall, male   DOB: 11-29-1966, 55 y.o.   MRN: [id number]  PROGRESS NOTE    WAYLAND BAIK  RWE:[id number] DOB: [birth year] DOA: 01/06/2021 PCP: Maximiano Coss, NP   Brief Narrative:  55 year old male with history of episodic alcohol abuse/binge drinking, cirrhosis of liver recent admission and discharge from 12/27/2019-12/30/2019 for worsening LFTs for which he was started on oral prednisone and he was also found to have colitis on CT of the abdomen and GI pathogen panel showed enteroaggregative E. coli which was treated with ciprofloxacin was referred for admission by GI for worsening liver function.  On presentation, T bili was 15.4.  GI was consulted.  Assessment & Plan:   Decompensated cirrhosis of liver with ascites and portal hypertension with increasing LFTs and hyperbilirubinemia Recent colitis on CT scan with stool panel positive for enteroaggregative E. coli treated with Cipro -Status post ultrasound-guided paracentesis by IR on 01/07/2021 with removal of 4 L fluid.  Peritoneal fluid did not show signs suggestive of SBP -Prednisolone stopped by GI on 01/07/2021.  Bilirubin improving down to 12.9 today.  AST and ALT are also slightly improving.  MRI abdomen/MRCP showed cirrhosis but no liver mass with cholelithiasis and contracted gallbladder but no choledocholithiasis or biliary ductal dilatation -Patient had recent outpatient EGD and colonoscopy which did not show any varices or portal hypertensive gastropathy and there was erythematous mucosa in the ascending colon and proximal transverse colon -Strict input and output.  Daily weights.  Fluid restriction.  Continue spironolactone and Lasix. -Repeat a.m. 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  No signs of bleeding.  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  Monitor  Obesity -Outpatient  follow-up  DVT prophylaxis: SCDs Code Status: Full Family Communication: None Disposition Plan: Status is: Inpatient  Remains inpatient appropriate because:Inpatient level of care appropriate due to severity of illness.  Bilirubin still significantly elevated.   Dispo: The patient is from: Home              Anticipated d/c is to: Home              Anticipated d/c date is: 1 day              Patient currently is not medically stable to d/c.   Difficult to place patient No   Consultants: GI  Procedures: Paracentesis on 01/07/2021  Antimicrobials: None   Subjective: Patient seen and examined at bedside.  Feels slightly better.  Feels that his abdomen is not that swollen.  No overnight fever, vomiting, worsening abdominal pain.  Objective: Vitals:   01/07/21 2045 01/08/21 0107 01/08/21 0537 01/08/21 0824  BP: 134/73 131/65 121/73 128/70  Pulse: 83 89 83 72  Resp: 16 18 18 20   Temp: 98 F (36.7 C) 97.7 F (36.5 C) 97.9 F (36.6 C) 97.6 F (36.4 C)  TempSrc: Oral Oral Tympanic Oral  SpO2: 98% 96% 95% 97%  Weight:   101.8 kg    No intake or output data in the 24 hours ending 01/08/21 1038 Filed Weights   01/08/21 0537  Weight: 101.8 kg    Examination:  General exam: Appears calm and comfortable.  Currently on room air Respiratory system: Bilateral decreased breath sounds at bases with scattered crackles Cardiovascular system: S1 & S2 heard, Rate controlled Gastrointestinal system: Abdomen is distended, soft and nontender. Normal bowel sounds heard. Extremities: No cyanosis, clubbing; lower extremity edema present  Central nervous system: Alert and oriented. No focal neurological deficits. Moving extremities Skin: No rashes, lesions or ulcers Psychiatry: Judgement and insight appear normal. Mood & affect appropriate.     Data Reviewed: I have personally reviewed following labs and imaging studies  CBC: Recent Labs  Lab 01/05/21 0940 01/06/21 1926  01/07/21 0804 01/08/21 0213  WBC 8.3 9.6 6.6 7.7  NEUTROABS  --   --  4.0 6.0  HGB 12.6* 12.2* 11.7* 10.9*  HCT 36.5* 34.6* 32.9* 30.5*  MCV 95.9 93.3 93.7 92.1  PLT 50.0 Repeated and verified X2.* 47* 43* 41*   Basic Metabolic Panel: Recent Labs  Lab 01/05/21 0940 01/06/21 1926 01/07/21 0804 01/08/21 0213  NA 135 132* 136 135  K 3.8 3.9 4.0 4.4  CL 106 101 104 103  CO2 24 22 23 24   GLUCOSE 133* 95 92 113*  BUN 14 10 11 15   CREATININE 0.83 0.83 0.76 0.94  CALCIUM 8.3* 8.0* 8.1* 8.0*  MG  --   --  1.6* 1.8   GFR: Estimated Creatinine Clearance: 102.2 mL/min (by C-G formula based on SCr of 0.94 mg/dL). Liver Function Tests: Recent Labs  Lab 01/05/21 0940 01/06/21 1926 01/07/21 0804 01/08/21 0213  AST 170* 153* 143* 119*  ALT 66* 60* 56* 45*  ALKPHOS 263* 264* 213* 231*  BILITOT 14.6* 15.4* 15.3* 12.9*  PROT 5.5* 5.9* 5.7* 5.0*  ALBUMIN 2.9* 2.4* 2.3* 2.0*   Recent Labs  Lab 01/06/21 1926  LIPASE 51   No results for input(s): AMMONIA in the last 168 hours. Coagulation Profile: Recent Labs  Lab 01/05/21 0940  INR 1.7*   Cardiac Enzymes: No results for input(s): CKTOTAL, CKMB, CKMBINDEX, TROPONINI in the last 168 hours. BNP (last 3 results) No results for input(s): PROBNP in the last 8760 hours. HbA1C: No results for input(s): HGBA1C in the last 72 hours. CBG: No results for input(s): GLUCAP in the last 168 hours. Lipid Profile: No results for input(s): CHOL, HDL, LDLCALC, TRIG, CHOLHDL, LDLDIRECT in the last 72 hours. Thyroid Function Tests: No results for input(s): TSH, T4TOTAL, FREET4, T3FREE, THYROIDAB in the last 72 hours. Anemia Panel: No results for input(s): VITAMINB12, FOLATE, FERRITIN, TIBC, IRON, RETICCTPCT in the last 72 hours. Sepsis Labs: No results for input(s): PROCALCITON, LATICACIDVEN in the last 168 hours.  Recent Results (from the past 240 hour(s))  Resp Panel by RT-PCR (Flu A&B, Covid) Nasopharyngeal Swab     Status: None    Collection Time: 01/07/21 12:58 AM   Specimen: Nasopharyngeal Swab; Nasopharyngeal(NP) swabs in vial transport medium  Result Value Ref Range Status   SARS Coronavirus 2 by RT PCR NEGATIVE NEGATIVE Final    Comment: (NOTE) SARS-CoV-2 target nucleic acids are NOT DETECTED.  The SARS-CoV-2 RNA is generally detectable in upper respiratory specimens during the acute phase of infection. The lowest concentration of SARS-CoV-2 viral copies this assay can detect is 138 copies/mL. A negative result does not preclude SARS-Cov-2 infection and should not be used as the sole basis for treatment or other patient management decisions. A negative result may occur with  improper specimen collection/handling, submission of specimen other than nasopharyngeal swab, presence of viral mutation(s) within the areas targeted by this assay, and inadequate number of viral copies(<138 copies/mL). A negative result must be combined with clinical observations, patient history, and epidemiological information. The expected result is Negative.  Fact Sheet for Patients:  EntrepreneurPulse.com.au  Fact Sheet for Healthcare Providers:  IncredibleEmployment.be  This test is no t yet approved  or cleared by the [place] and  has been authorized for detection and/or diagnosis of SARS-CoV-2 by FDA under an Emergency Use Authorization (EUA). This EUA will remain  in effect (meaning this test can be used) for the duration of the COVID-19 declaration under Section 564(b)(1) of the Act, 21 U.S.C.section 360bbb-3(b)(1), unless the authorization is terminated  or revoked sooner.       Influenza A by PCR NEGATIVE NEGATIVE Final   Influenza B by PCR NEGATIVE NEGATIVE Final    Comment: (NOTE) The Xpert Xpress SARS-CoV-2/FLU/RSV plus assay is intended as an aid in the diagnosis of influenza from Nasopharyngeal swab specimens and should not be used as a sole basis for treatment.  Nasal washings and aspirates are unacceptable for Xpert Xpress SARS-CoV-2/FLU/RSV testing.  Fact Sheet for Patients: EntrepreneurPulse.com.au  Fact Sheet for Healthcare Providers: IncredibleEmployment.be  This test is not yet approved or cleared by the [place] FDA and has been authorized for detection and/or diagnosis of SARS-CoV-2 by FDA under an Emergency Use Authorization (EUA). This EUA will remain in effect (meaning this test can be used) for the duration of the COVID-19 declaration under Section 564(b)(1) of the Act, 21 U.S.C. section 360bbb-3(b)(1), unless the authorization is terminated or revoked.  Performed at [facility], [place] [address] [place], [place] [address]   Gram stain     Status: None (Preliminary result)   Collection Time: 01/07/21 12:45 PM   Specimen: Abdomen; Peritoneal Fluid  Result Value Ref Range Status   Specimen Description PERITONEAL FLUID  Final   Special Requests ABDOMEN  Final   Gram Stain   Final    RARE WBC PRESENT, PREDOMINANTLY PMN NO ORGANISMS SEEN Performed at [facility], [place] [address], [place], [place] [address]    Report Status PENDING  Incomplete  Culture, body fluid-bottle     Status: None (Preliminary result)   Collection Time: 01/07/21 12:45 PM   Specimen: Peritoneal Washings  Result Value Ref Range Status   Specimen Description PERITONEAL FLUID  Final   Special Requests ABDOMEN  Final   Gram Stain PENDING  Incomplete   Culture   Final    NO GROWTH < 24 HOURS Performed at [facility], [place] [address] [place], [place] [address]    Report Status PENDING  Incomplete         Radiology Studies: MR ABDOMEN MRCP W WO CONTAST  Result Date: 01/07/2021 CLINICAL DATA:  Inpatient. Jaundice. Cirrhosis with ascites, status post paracentesis. EXAM: MRI ABDOMEN WITHOUT AND WITH CONTRAST (INCLUDING MRCP) TECHNIQUE: Multiplanar multisequence MR imaging of the abdomen was  performed both before and after the administration of intravenous contrast. Heavily T2-weighted images of the biliary and pancreatic ducts were obtained, and three-dimensional MRCP images were rendered by post processing. CONTRAST:  52m GADAVIST GADOBUTROL 1 MMOL/ML IV SOLN COMPARISON:  12/24/2020 CT abdomen/pelvis. FINDINGS: Lower chest: No acute abnormality at the lung bases. Hepatobiliary: Diffusely irregular liver surface and relatively atrophic liver compatible with cirrhosis. No hepatic steatosis. No liver mass. There is a 3.6 cm gallstone within the contracted gallbladder. Borderline mild diffuse gallbladder wall thickening. No biliary ductal dilatation. Common bile duct diameter 2 mm. No evidence of choledocholithiasis. No biliary masses, strictures or beading. Pancreas: No pancreatic mass or duct dilation.  No pancreas divisum. Spleen: Mild splenomegaly. Craniocaudal splenic length 14.1 cm. No splenic masses. Adrenals/Urinary Tract: Normal adrenals. No hydronephrosis. Several scattered subcentimeter simple renal cysts in both kidneys. No suspicious renal masses.  Stomach/Bowel: Normal non-distended stomach. No dilated small or large bowel loops. Diffuse colonic diverticulosis. Generalized mild wall thickening in the small and large bowel, similar to the recent CT. Vascular/Lymphatic: Normal caliber abdominal aorta. Patent portal, splenic, hepatic and renal veins. Small gastroesophageal and paraumbilical varices. Mild porta hepatis adenopathy up to 1.2 cm (series 26/image 54). Other: Small volume ascites, most prominent in the perihepatic space. No focal fluid collections. Musculoskeletal: No aggressive appearing focal osseous lesions. Small right L2 vertebral hemangioma. IMPRESSION: 1. Cirrhosis. No liver mass. 2. Mild splenomegaly. Small volume ascites. Small gastroesophageal and paraumbilical varices. 3. Mild porta hepatis adenopathy, nonspecific, more likely reactive. 4. Cholelithiasis with contracted  gallbladder. Borderline mild diffuse gallbladder wall thickening, probably due to noninflammatory edema. 5. No biliary ductal dilatation. No evidence of choledocholithiasis. 6. Diffuse colonic diverticulosis. Generalized mild wall thickening in the small and large bowel, similar to the recent CT, favor noninflammatory edema. Electronically Signed   By: Ilona Sorrel M.D.   On: 01/07/2021 20:58   [place] ASCITES (ABDOMEN LIMITED)  Result Date: 01/07/2021 CLINICAL DATA:  Ascites EXAM: LIMITED ABDOMEN ULTRASOUND FOR ASCITES TECHNIQUE: Limited ultrasound survey for ascites was performed in all four abdominal quadrants. COMPARISON:  03/26/2021 FINDINGS: There is a small to moderate volume of abdominal ascites, primarily in the upper abdomen. The liver is cirrhotic. IMPRESSION: Small to moderate volume of ascites, primarily in the upper abdomen. Electronically Signed   By: Constance Holster M.D.   On: 01/07/2021 01:51   US Abdomen Limited RUQ (LIVER/GB)  Result Date: 01/07/2021 CLINICAL DATA:  Abdominal pain EXAM: ULTRASOUND ABDOMEN LIMITED RIGHT UPPER QUADRANT COMPARISON:  December 27, 2020 FINDINGS: Gallbladder: There is a large gallstone within the gallbladder that limits evaluation. There appears to be some gallbladder wall thickening with the gallbladder wall measuring 4 mm in thickness. The sonographic Percell Miller sign is negative. Common bile duct: Diameter: 5 mm Liver: The liver surface appears nodular suggestive of underlying cirrhosis. Portal vein is patent on color Doppler imaging with normal direction of blood flow towards the liver. Other: Ascites is noted. IMPRESSION: 1. Cholelithiasis with nonspecific gallbladder wall thickening. This can be seen in the presence of ascites or acute cholecystitis. Consider further evaluation with HIDA scan as clinically indicated. 2. Cirrhotic appearing liver with moderate volume ascites. Electronically Signed   By: Constance Holster M.D.   On: 01/07/2021 01:50   IR  Paracentesis  Result Date: 01/07/2021 INDICATION: Patient with history of alcoholic cirrhosis, ascites. Request is made for diagnostic and therapeutic paracentesis. EXAM: ULTRASOUND GUIDED DIAGNOSTIC AND THERAPEUTIC PARACENTESIS MEDICATIONS: 10 mL 1% lidocaine COMPLICATIONS: None immediate. PROCEDURE: Informed written consent was obtained from the patient after a discussion of the risks, benefits and alternatives to treatment. A timeout was performed prior to the initiation of the procedure. Initial ultrasound scanning demonstrates a moderate amount of ascites within the right lower abdominal quadrant. The right lower abdomen was prepped and draped in the usual sterile fashion. 1% lidocaine was used for local anesthesia. Following this, a 19 gauge, 7-cm, Yueh catheter was introduced. An ultrasound image was saved for documentation purposes. The paracentesis was performed. The catheter was removed and a dressing was applied. The patient tolerated the procedure well without immediate post procedural complication. FINDINGS: A total of approximately 4.0 liters of bright yellow fluid was removed. IMPRESSION: Successful ultrasound-guided diagnostic and therapeutic paracentesis yielding 4.0 liters of peritoneal fluid. Read by: Brynda Greathouse PA-C Electronically Signed   By: Miachel Roux M.D.   On: 01/07/2021 13:45  Scheduled Meds: . dicyclomine  10 mg Oral TID AC & HS  . folic acid  1 mg Oral Daily  . furosemide  40 mg Oral Daily  . psyllium  1 packet Oral BID  . spironolactone  100 mg Oral Daily  . thiamine  100 mg Oral Daily   Continuous Infusions: . albumin human            Aline August, MD [facility] 01/08/2021, 10:38 AM

## 2021-01-08 NOTE — Progress Notes (Signed)
Interventional Radiology Brief Note:  IR consulted for liver biopsy at the request of Dr. Havery Moros.  Case discussed with both Dr. Earleen Newport and Dr. Dwaine Gale.  Plan to proceed as able with transjugular liver biopsy tomorrow in IR.  No additional platelets needed at this time.  NPO order placed.  Patient not on anticoagulation.  Formal consult to follow.   Brynda Greathouse, MS RD PA-C

## 2021-01-08 NOTE — Progress Notes (Addendum)
Progress Note   Subjective  Chief Complaint: Decompensated cirrhosis, elevated LFTs  Today, the patient tells me he is feeling better.  He tells me he actually does not have any abdominal pain anymore and tolerated a regular diet.  He passed a stool this morning and tells me he saw the hospitalist who told him he could get up and try and walk around the room and see how he felt.  Tells me prior to hospitalization he would get short of breath when walking around.  He is asking how long he will be out of work.  Apparently is on his feet for 10 hours at a time and lifting over 50 pounds.   Objective   Vital signs in last 24 hours: Temp:  [97.6 F (36.4 C)-98.1 F (36.7 C)] 97.6 F (36.4 C) (02/10 0824) Pulse Rate:  [72-89] 72 (02/10 0824) Resp:  [16-20] 20 (02/10 0824) BP: (121-134)/(65-77) 128/70 (02/10 0824) SpO2:  [95 %-98 %] 97 % (02/10 0824) Weight:  [101.8 kg] 101.8 kg (02/10 0537)   General:    Hispanic male in NAD +icteris Heart:  Regular rate and rhythm; no murmurs Lungs: Respirations even and unlabored, lungs CTA bilaterally Abdomen:  Soft, nontender and moderate distension. Normal bowel sounds. Extremities:  +pitting edema b/l lower ext Neurologic:  Alert and oriented,  grossly normal neurologically. Psych:  Cooperative. Normal mood and affect.  Lab Results: Recent Labs    01/06/21 1926 01/07/21 0804 01/08/21 0213  WBC 9.6 6.6 7.7  HGB 12.2* 11.7* 10.9*  HCT 34.6* 32.9* 30.5*  PLT 47* 43* 41*   BMET Recent Labs    01/06/21 1926 01/07/21 0804 01/08/21 0213  NA 132* 136 135  K 3.9 4.0 4.4  CL 101 104 103  CO2 22 23 24   GLUCOSE 95 92 113*  BUN 10 11 15   CREATININE 0.83 0.76 0.94  CALCIUM 8.0* 8.1* 8.0*   LFT Recent Labs    01/06/21 1449 01/06/21 1926 01/08/21 0213  PROT  --    < > 5.0*  ALBUMIN  --    < > 2.0*  AST  --    < > 119*  ALT  --    < > 45*  ALKPHOS  --    < > 231*  BILITOT  --    < > 12.9*  BILIDIR 6.3*  --   --    < > = values  in this interval not displayed.   PT/INR Recent Labs    01/05/21 0940  LABPROT 19.4*  INR 1.7*    Studies/Results: MR ABDOMEN MRCP W WO CONTAST  Result Date: 01/07/2021 CLINICAL DATA:  Inpatient. Jaundice. Cirrhosis with ascites, status post paracentesis. EXAM: MRI ABDOMEN WITHOUT AND WITH CONTRAST (INCLUDING MRCP) TECHNIQUE: Multiplanar multisequence MR imaging of the abdomen was performed both before and after the administration of intravenous contrast. Heavily T2-weighted images of the biliary and pancreatic ducts were obtained, and three-dimensional MRCP images were rendered by post processing. CONTRAST:  70m GADAVIST GADOBUTROL 1 MMOL/ML IV SOLN COMPARISON:  12/24/2020 CT abdomen/pelvis. FINDINGS: Lower chest: No acute abnormality at the lung bases. Hepatobiliary: Diffusely irregular liver surface and relatively atrophic liver compatible with cirrhosis. No hepatic steatosis. No liver mass. There is a 3.6 cm gallstone within the contracted gallbladder. Borderline mild diffuse gallbladder wall thickening. No biliary ductal dilatation. Common bile duct diameter 2 mm. No evidence of choledocholithiasis. No biliary masses, strictures or beading. Pancreas: No pancreatic mass or duct dilation.  No pancreas  divisum. Spleen: Mild splenomegaly. Craniocaudal splenic length 14.1 cm. No splenic masses. Adrenals/Urinary Tract: Normal adrenals. No hydronephrosis. Several scattered subcentimeter simple renal cysts in both kidneys. No suspicious renal masses. Stomach/Bowel: Normal non-distended stomach. No dilated small or large bowel loops. Diffuse colonic diverticulosis. Generalized mild wall thickening in the small and large bowel, similar to the recent CT. Vascular/Lymphatic: Normal caliber abdominal aorta. Patent portal, splenic, hepatic and renal veins. Small gastroesophageal and paraumbilical varices. Mild porta hepatis adenopathy up to 1.2 cm (series 26/image 54). Other: Small volume ascites, most prominent  in the perihepatic space. No focal fluid collections. Musculoskeletal: No aggressive appearing focal osseous lesions. Small right L2 vertebral hemangioma. IMPRESSION: 1. Cirrhosis. No liver mass. 2. Mild splenomegaly. Small volume ascites. Small gastroesophageal and paraumbilical varices. 3. Mild porta hepatis adenopathy, nonspecific, more likely reactive. 4. Cholelithiasis with contracted gallbladder. Borderline mild diffuse gallbladder wall thickening, probably due to noninflammatory edema. 5. No biliary ductal dilatation. No evidence of choledocholithiasis. 6. Diffuse colonic diverticulosis. Generalized mild wall thickening in the small and large bowel, similar to the recent CT, favor noninflammatory edema. Electronically Signed   By: Ilona Sorrel M.D.   On: 01/07/2021 20:58   [place] ASCITES (ABDOMEN LIMITED)  Result Date: 01/07/2021 CLINICAL DATA:  Ascites EXAM: LIMITED ABDOMEN ULTRASOUND FOR ASCITES TECHNIQUE: Limited ultrasound survey for ascites was performed in all four abdominal quadrants. COMPARISON:  03/26/2021 FINDINGS: There is a small to moderate volume of abdominal ascites, primarily in the upper abdomen. The liver is cirrhotic. IMPRESSION: Small to moderate volume of ascites, primarily in the upper abdomen. Electronically Signed   By: Constance Holster M.D.   On: 01/07/2021 01:51   US Abdomen Limited RUQ (LIVER/GB)  Result Date: 01/07/2021 CLINICAL DATA:  Abdominal pain EXAM: ULTRASOUND ABDOMEN LIMITED RIGHT UPPER QUADRANT COMPARISON:  December 27, 2020 FINDINGS: Gallbladder: There is a large gallstone within the gallbladder that limits evaluation. There appears to be some gallbladder wall thickening with the gallbladder wall measuring 4 mm in thickness. The sonographic Percell Miller sign is negative. Common bile duct: Diameter: 5 mm Liver: The liver surface appears nodular suggestive of underlying cirrhosis. Portal vein is patent on color Doppler imaging with normal direction of blood flow towards the  liver. Other: Ascites is noted. IMPRESSION: 1. Cholelithiasis with nonspecific gallbladder wall thickening. This can be seen in the presence of ascites or acute cholecystitis. Consider further evaluation with HIDA scan as clinically indicated. 2. Cirrhotic appearing liver with moderate volume ascites. Electronically Signed   By: Constance Holster M.D.   On: 01/07/2021 01:50   IR Paracentesis  Result Date: 01/07/2021 INDICATION: Patient with history of alcoholic cirrhosis, ascites. Request is made for diagnostic and therapeutic paracentesis. EXAM: ULTRASOUND GUIDED DIAGNOSTIC AND THERAPEUTIC PARACENTESIS MEDICATIONS: 10 mL 1% lidocaine COMPLICATIONS: None immediate. PROCEDURE: Informed written consent was obtained from the patient after a discussion of the risks, benefits and alternatives to treatment. A timeout was performed prior to the initiation of the procedure. Initial ultrasound scanning demonstrates a moderate amount of ascites within the right lower abdominal quadrant. The right lower abdomen was prepped and draped in the usual sterile fashion. 1% lidocaine was used for local anesthesia. Following this, a 19 gauge, 7-cm, Yueh catheter was introduced. An ultrasound image was saved for documentation purposes. The paracentesis was performed. The catheter was removed and a dressing was applied. The patient tolerated the procedure well without immediate post procedural complication. FINDINGS: A total of approximately 4.0 liters of bright yellow fluid was removed. IMPRESSION: Successful ultrasound-guided  diagnostic and therapeutic paracentesis yielding 4.0 liters of peritoneal fluid. Read by: Brynda Greathouse PA-C Electronically Signed   By: Miachel Roux M.D.   On: 01/07/2021 13:45     Assessment / Plan:   Assessment: 1.  Decompensated cirrhosis with portal hypertension: recently diagnosed INR stable, LFTs decreasing overnight, MRCP as above 2.  Hyperbilirubinemia 3.  Recent colitis on CT scan: stool path  panel positive for enteroelaborative E. coli, treated with Cipro, mild erythema of the ascending colon and proximal transverse colon on colonoscopy a few days ago biopsies were negative 4.  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  Mild normocytic anemia  Plan: 1.  MRCP as above, no sign of HCC 2.  LFTs are trending down, again could consider liver biopsy pending his course 3.  HCV RNA pending 4.  Continue to monitor LFTs and continue supportive measures 5.  Please await any further recommendations from Dr. Havery Moros later today  Thank you for kind consultation, we will continue to follow.   LOS: 1 day   Ian Hall  01/08/2021, 9:04 AM     ATTENDING ATTESTATION: Agree with assessment as outlined with the following thoughts. MRCP / MRI liver done. No evidence of HCC. Changes of cirrhosis with portal hypertension noted. Large gallstone appreciated but no biliary ductal dilation. He has no abdominal pain and eating well, no clinical evidence for cholecystitis. He has a large gallstone in the gallbladder but a poor surgical candidate for cholecystectomy (would normally consider cholecystectomy electively to reduce long term risk for gallbladder cancer). He does not have evidence of SBP. His abdominal pain is much better post paracentesis of 4 liters, now resolved. His bilirubin remains elevated but slightly improved. We discussed ddx for his jaundice. I suspect this is more than likely represents progression of decompensated cirrhosis, while component of alcoholic hepatitis possible (he did not improve with prednisolone so it was stopped), DILI (recent cipro use), vs. other. He historically drank heavily many years ago but states he had no alcohol consumption other than new years day during this time. Alcoholic hepatitis from this would be unusual. Given his recent worsening of jaundice and to help clarify what is driving this we discussed utility of a liver biopsy. In his case, this would  need to be done via transjugular approach in light of his ascites. Main risk in his case is bleeding given his coagulopathy and baseline thrombocytopenia. We will have IR evaluate him for this and discuss if they are comfortable proceeding as is, or would want platelet transfusion for biopsy, or would rather hold off right now. Otherwise continue diet, and trend LAEs.    [place], MD [facility]

## 2021-01-09 ENCOUNTER — Telehealth: Payer: Self-pay | Admitting: Registered Nurse

## 2021-01-09 ENCOUNTER — Inpatient Hospital Stay (HOSPITAL_COMMUNITY): Payer: No Typology Code available for payment source

## 2021-01-09 ENCOUNTER — Ambulatory Visit: Payer: No Typology Code available for payment source | Admitting: Registered Nurse

## 2021-01-09 ENCOUNTER — Encounter (HOSPITAL_COMMUNITY): Payer: Self-pay | Admitting: Internal Medicine

## 2021-01-09 ENCOUNTER — Other Ambulatory Visit: Payer: Self-pay

## 2021-01-09 DIAGNOSIS — R17 Unspecified jaundice: Secondary | ICD-10-CM

## 2021-01-09 DIAGNOSIS — K7010 Alcoholic hepatitis without ascites: Secondary | ICD-10-CM

## 2021-01-09 HISTORY — PX: IR TRANSCATHETER BX: IMG713

## 2021-01-09 HISTORY — PX: IR US GUIDE VASC ACCESS RIGHT: IMG2390

## 2021-01-09 HISTORY — PX: IR VENOGRAM HEPATIC W HEMODYNAMIC EVALUATION: IMG692

## 2021-01-09 LAB — COMPREHENSIVE METABOLIC PANEL
ALT: 50 U/L — ABNORMAL HIGH (ref 0–44)
AST: 146 U/L — ABNORMAL HIGH (ref 15–41)
Albumin: 2.1 g/dL — ABNORMAL LOW (ref 3.5–5.0)
Alkaline Phosphatase: 225 U/L — ABNORMAL HIGH (ref 38–126)
Anion gap: 8 (ref 5–15)
BUN: 15 mg/dL (ref 6–20)
CO2: 24 mmol/L (ref 22–32)
Calcium: 8.1 mg/dL — ABNORMAL LOW (ref 8.9–10.3)
Chloride: 104 mmol/L (ref 98–111)
Creatinine, Ser: 0.95 mg/dL (ref 0.61–1.24)
GFR, Estimated: >60 mL/min (ref >60)
Glucose, Bld: 87 mg/dL (ref 70–99)
Potassium: 4.3 mmol/L (ref 3.5–5.1)
Sodium: 136 mmol/L (ref 135–145)
Total Bilirubin: 12.2 mg/dL — ABNORMAL HIGH (ref 0.3–1.2)
Total Protein: 5.3 g/dL — ABNORMAL LOW (ref 6.5–8.1)

## 2021-01-09 LAB — MAGNESIUM: Magnesium: 1.6 mg/dL — ABNORMAL LOW (ref 1.7–2.4)

## 2021-01-09 MED ORDER — MAGNESIUM SULFATE 2 GM/50ML IV SOLN
2.0000 g | Freq: Once | INTRAVENOUS | Status: AC
  Administered 2021-01-09: 2 g via INTRAVENOUS
  Filled 2021-01-09: qty 50

## 2021-01-09 MED ORDER — MIDAZOLAM HCL 2 MG/2ML IJ SOLN
INTRAMUSCULAR | Status: AC | PRN
  Administered 2021-01-09: 1 mg via INTRAVENOUS
  Administered 2021-01-09: 0.5 mg via INTRAVENOUS

## 2021-01-09 MED ORDER — LIDOCAINE HCL 1 % IJ SOLN
INTRAMUSCULAR | Status: AC | PRN
Start: 2021-01-09 — End: 2021-01-09
  Administered 2021-01-09: 10 mL

## 2021-01-09 MED ORDER — SODIUM CHLORIDE 0.9 % IV SOLN
INTRAVENOUS | Status: AC | PRN
  Administered 2021-01-09: 10 mL/h via INTRAVENOUS

## 2021-01-09 MED ORDER — IOHEXOL 300 MG/ML  SOLN
50.0000 mL | Freq: Once | INTRAMUSCULAR | Status: AC | PRN
  Administered 2021-01-09: 15 mL via INTRA_ARTERIAL

## 2021-01-09 MED ORDER — FENTANYL CITRATE (PF) 100 MCG/2ML IJ SOLN
INTRAMUSCULAR | Status: AC
  Filled 2021-01-09: qty 2

## 2021-01-09 MED ORDER — FENTANYL CITRATE (PF) 100 MCG/2ML IJ SOLN
INTRAMUSCULAR | Status: AC | PRN
  Administered 2021-01-09: 50 ug via INTRAVENOUS
  Administered 2021-01-09: 25 ug via INTRAVENOUS

## 2021-01-09 MED ORDER — LIDOCAINE HCL 1 % IJ SOLN
INTRAMUSCULAR | Status: AC
  Filled 2021-01-09: qty 20

## 2021-01-09 MED ORDER — MIDAZOLAM HCL 2 MG/2ML IJ SOLN
INTRAMUSCULAR | Status: AC
  Filled 2021-01-09: qty 2

## 2021-01-09 NOTE — Procedures (Signed)
Interventional Radiology Procedure Note  Procedure: Transjugular liver biopsy  Indication: Liver failure  Findings: Please refer to procedural dictation for full description.  Complications: None  EBL: < 10 mL  Miachel Roux, MD [phone]

## 2021-01-09 NOTE — Discharge Summary (Signed)
Physician Discharge Summary  Ian Hall VFM:[id number] DOB: 1966-02-17 DOA: 01/06/2021  PCP: Maximiano Coss, NP  Admit date: 01/06/2021 Discharge date: 01/09/2021  Admitted From: Home Disposition: Home  Recommendations for Outpatient Follow-up:  1. Follow up with PCP in 1 week with repeat CBC/CMP 2. Outpatient followup with GI 3. Follow up in ED if symptoms worsen or new appear   Home Health: No Equipment/Devices: None  Discharge Condition: Guarded CODE STATUS: Full Diet recommendation: Heart healthy/fluid restriction of upto 1251m per day  Brief/Interim Summary: 55year old male with history of episodic alcohol abuse/binge drinking, cirrhosis of liver recent admission and discharge from 12/27/2019-12/30/2019 for worsening LFTs for which he was started on oral prednisone and he was also found to have colitis on CT of the abdomen and GI pathogen panel showed enteroaggregative E. coli which was treated with ciprofloxacin was referred for admission by GI for worsening liver function.  On presentation, T bili was 15.4.  GI was consulted. He underwent IR guided transjugular liver biopsy today. IR and GI have cleared him for discharge. He will be discharged today with outpatient followup with GI.   Discharge Diagnoses:   Decompensated cirrhosis of liver with ascites and portal hypertension with increasing LFTs and hyperbilirubinemia Recent colitis on CT scan with stool panel positive for enteroaggregative E. coli treated with Cipro -Status post ultrasound-guided paracentesis by IR on 01/07/2021 with removal of 4 L fluid.  Peritoneal fluid did not show signs suggestive of SBP -Prednisolone stopped by GI on 01/07/2021.  Bilirubin improving down to 12.9 today.  AST and ALT are also slightly improving.  MRI abdomen/MRCP showed cirrhosis but no liver mass with cholelithiasis and contracted gallbladder but no choledocholithiasis or biliary ductal dilatation -Patient had recent outpatient EGD and  colonoscopy which did not show any varices or portal hypertensive gastropathy and there was erythematous mucosa in the ascending colon and proximal transverse colon - Continue spironolactone and Lasix. -Outpatient followup of LFTs -He underwent IR guided transjugular liver biopsy today. IR and GI have cleared him for discharge. He will be discharged today with outpatient followup with GI.  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  No signs of bleeding.  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  Monitor  Obesity -Outpatient follow-up  Discharge Instructions  Discharge Instructions    Ambulatory referral to Gastroenterology   Complete by: As directed    Diet - low sodium heart healthy   Complete by: As directed    Fluid restriction of upto 12048mper day   Increase activity slowly   Complete by: As directed    No wound care   Complete by: As directed      Allergies as of 01/09/2021      Reactions   Hydrocodone Itching   Noted with hydrocodone cough syrup. Itching only, no hives.  02/03/16: denied itching with recent hydrocodone cough syrup. Only one episode of itching previously.       Medication List    STOP taking these medications   predniSONE 20 MG tablet Commonly known as: DELTASONE     TAKE these medications   dicyclomine 10 MG capsule Commonly known as: BENTYL Take 1 capsule (10 mg total) by mouth 4 (four) times daily -  before meals and at bedtime.   folic acid 1 MG tablet Commonly known as: FOLVITE Take 1 tablet (1 mg total) by mouth daily.   furosemide 40 MG tablet Commonly known as: Lasix  Take 1 tablet (40 mg total) by mouth daily.   psyllium 28 % packet Commonly known as: METAMUCIL SMOOTH TEXTURE Take 1 packet by mouth 2 (two) times daily.   spironolactone 100 MG tablet Commonly known as: Aldactone Take 1 tablet (100 mg  total) by mouth daily.   thiamine 100 MG tablet Take 1 tablet (100 mg total) by mouth daily.       Follow-up Information    Maximiano Coss, NP. Schedule an appointment as soon as possible for a visit in 1 week(s).   Specialty: Adult Health Nurse Practitioner Why: with repeat cbc/cmp Contact information: [place] [address] [phone]              Allergies  Allergen Reactions  . Hydrocodone Itching    Noted with hydrocodone cough syrup. Itching only, no hives.  02/03/16: denied itching with recent hydrocodone cough syrup. Only one episode of itching previously.     Consultations:  GI/IR   Procedures/Studies: CT Abdomen Pelvis W Contrast  Addendum Date: 12/24/2020   ADDENDUM REPORT: 12/24/2020 17:19 ADDENDUM: These results were called by telephone at the time of interpretation on 12/24/2020 at 5:19 pm to provider Dr. Lucio Edward, Who verbally acknowledged these results. Electronically Signed   By: Markus Daft M.D.   On: 12/24/2020 17:19   Result Date: 12/24/2020 CLINICAL DATA:  55 year old with acute abdominal pain. Bloody stools. Blood in urine. Weight loss. Thrombocytopenia. EXAM: CT ABDOMEN AND PELVIS WITH CONTRAST TECHNIQUE: Multidetector CT imaging of the abdomen and pelvis was performed using the standard protocol following bolus administration of intravenous contrast. CONTRAST:  135m OMNIPAQUE IOHEXOL 300 MG/ML  SOLN COMPARISON:  Ultrasound 02/04/2017.  CT report from 05/25/2003 FINDINGS: Lower chest: Subtle patchy densities in the right middle lobe concerning for infectious or inflammatory process. No large pleural effusions. Hepatobiliary: Liver has a nodular contour. Enlargement of the caudate lobe. Findings are compatible with cirrhosis. There is moderate amount of perihepatic ascites. No suspicious liver lesion. Main portal veins are patent. The gallbladder wall is either calcified or there is a very large stone in the gallbladder measuring up to 3.7  cm. 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 No significant biliary dilatation. Pancreas: Unremarkable. No pancreatic ductal dilatation or surrounding inflammatory changes. Spleen: Spleen is prominent for size and measures 14.1 cm in the craniocaudal dimension. No focal splenic lesion. There is perisplenic ascites. Adrenals/Urinary Tract: Normal appearance of the adrenal glands. Low-density structure of the right kidney upper pole likely represents a cyst. No suspicious renal lesion. No hydronephrosis. Fluid in the urinary bladder. Stomach/Bowel: Diverticula in the sigmoid colon and descending colon. Colonic wall thickening at the hepatic flexure and ascending colon. Colon wall thickening is well demonstrated on sequence 2 image 36. There is oral contrast throughout the colon and no evidence for obstruction. Few mildly dilated loops of small bowel throughout the abdomen. Small amount of fluid around the stomach without gross abnormality to the stomach. Vascular/Lymphatic: Normal caliber of the abdominal aorta without aneurysm or significant atherosclerotic disease. Main visceral arteries are patent. Bilateral iliac arteries are patent without significant stenosis. Proximal femoral arteries are patent. Portal venous system is patent. SMV is patent. Splenic vein appears to be patent. No significant gastric or esophageal varices. Slightly prominent mesenteric lymph nodes. Overall, no significant lymph node enlargement in the abdomen or pelvis. Reproductive: Prostate is prominent measuring 4.6 cm in the transverse dimension. Other: Small to moderate amount ascites in the lower abdomen and pelvis. Moderate  amount ascites around the liver. Diffuse mesenteric edema. Subcutaneous edema around the umbilicus. Bilateral subcutaneous edema in the abdominal oblique regions. Musculoskeletal: No acute bone abnormality. IMPRESSION: 1. Cirrhosis with moderate amount ascites and splenomegaly. Findings are  compatible with portal hypertension. 2. Wall thickening involving the ascending colon and hepatic flexure. Some of the wall thickening could be associated with the adjacent ascites but also concerning for colitis involving the right colon. 3. Subtle patchy densities in the right middle lobe could represent mild infection or inflammation. 4. Extensive calcifications involving the gallbladder. 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 There may be gallstones near the gallbladder base. Electronically Signed: By: Markus Daft M.D. On: 12/24/2020 17:02   US Abdomen Limited  Result Date: 12/26/2020 CLINICAL DATA:  Abdominal swelling EXAM: ULTRASOUND ABDOMEN LIMITED RIGHT UPPER QUADRANT COMPARISON:  CT abdomen 12/24/2020 FINDINGS: Gallbladder: Calcified gallbladder wall versus large gallstone in the gallbladder fossa. No significant interval change compared with the prior CT of 12/24/2020. No sonographic Murphy sign noted by sonographer. Common bile duct: Diameter: 4 mm Liver: No focal hepatic mass. Nodular contour of the liver with a heterogeneous echotexture. Portal vein is patent on color Doppler imaging with normal direction of blood flow towards the liver. Other: Abdominal ascites. IMPRESSION: 1. Calcified gallbladder wall versus large gallstone in the gallbladder fossa. No significant interval change compared with prior CT of 12/24/2020. 2. Cirrhosis. No focal hepatic mass.  Abdominal ascites. Electronically Signed   By: Kathreen Devoid   On: 12/26/2020 16:44   DG Chest Portable 1 View  Result Date: 12/26/2020 CLINICAL DATA:  Cough EXAM: PORTABLE CHEST 1 VIEW COMPARISON:  03/22/2018 FINDINGS: The heart size and mediastinal contours are within normal limits. Mild bibasilar interstitial prominence. No focal airspace consolidation, pleural effusion, or pneumothorax. The visualized skeletal structures are unremarkable. IMPRESSION: Mild  bibasilar interstitial prominence, may reflect bronchitic type lung changes versus developing atypical/viral infection. Electronically Signed   By: Davina Poke D.O.   On: 12/26/2020 13:44   MR ABDOMEN MRCP W WO CONTAST  Result Date: 01/07/2021 CLINICAL DATA:  Inpatient. Jaundice. Cirrhosis with ascites, status post paracentesis. EXAM: MRI ABDOMEN WITHOUT AND WITH CONTRAST (INCLUDING MRCP) TECHNIQUE: Multiplanar multisequence MR imaging of the abdomen was performed both before and after the administration of intravenous contrast. Heavily T2-weighted images of the biliary and pancreatic ducts were obtained, and three-dimensional MRCP images were rendered by post processing. CONTRAST:  39m GADAVIST GADOBUTROL 1 MMOL/ML IV SOLN COMPARISON:  12/24/2020 CT abdomen/pelvis. FINDINGS: Lower chest: No acute abnormality at the lung bases. Hepatobiliary: Diffusely irregular liver surface and relatively atrophic liver compatible with cirrhosis. No hepatic steatosis. No liver mass. There is a 3.6 cm gallstone within the contracted gallbladder. Borderline mild diffuse gallbladder wall thickening. No biliary ductal dilatation. Common bile duct diameter 2 mm. No evidence of choledocholithiasis. No biliary masses, strictures or beading. Pancreas: No pancreatic mass or duct dilation.  No pancreas divisum. Spleen: Mild splenomegaly. Craniocaudal splenic length 14.1 cm. No splenic masses. Adrenals/Urinary Tract: Normal adrenals. No hydronephrosis. Several scattered subcentimeter simple renal cysts in both kidneys. No suspicious renal masses. Stomach/Bowel: Normal non-distended stomach. No dilated small or large bowel loops. Diffuse colonic diverticulosis. Generalized mild wall thickening in the small and large bowel, similar to the recent CT. Vascular/Lymphatic: Normal caliber abdominal aorta. Patent portal, splenic, hepatic and renal veins. Small gastroesophageal and paraumbilical varices. Mild porta hepatis adenopathy up to  1.2 cm (  series 26/image 54). Other: Small volume ascites, most prominent in the perihepatic space. No focal fluid collections. Musculoskeletal: No aggressive appearing focal osseous lesions. Small right L2 vertebral hemangioma. IMPRESSION: 1. Cirrhosis. No liver mass. 2. Mild splenomegaly. Small volume ascites. Small gastroesophageal and paraumbilical varices. 3. Mild porta hepatis adenopathy, nonspecific, more likely reactive. 4. Cholelithiasis with contracted gallbladder. Borderline mild diffuse gallbladder wall thickening, probably due to noninflammatory edema. 5. No biliary ductal dilatation. No evidence of choledocholithiasis. 6. Diffuse colonic diverticulosis. Generalized mild wall thickening in the small and large bowel, similar to the recent CT, favor noninflammatory edema. Electronically Signed   By: Ilona Sorrel M.D.   On: 01/07/2021 20:58   [place] ASCITES (ABDOMEN LIMITED)  Result Date: 01/07/2021 CLINICAL DATA:  Ascites EXAM: LIMITED ABDOMEN ULTRASOUND FOR ASCITES TECHNIQUE: Limited ultrasound survey for ascites was performed in all four abdominal quadrants. COMPARISON:  03/26/2021 FINDINGS: There is a small to moderate volume of abdominal ascites, primarily in the upper abdomen. The liver is cirrhotic. IMPRESSION: Small to moderate volume of ascites, primarily in the upper abdomen. Electronically Signed   By: Constance Holster M.D.   On: 01/07/2021 01:51   [place] ASCITES (ABDOMEN LIMITED)  Result Date: 12/27/2020 CLINICAL DATA:  55 year old male with ascites presenting for possible paracentesis. Recently started on Lasix. EXAM: LIMITED ABDOMEN ULTRASOUND FOR ASCITES TECHNIQUE: Limited ultrasound survey for ascites was performed in all four abdominal quadrants. COMPARISON:  Abdominal ultrasound 12/26/2020 FINDINGS: Marked interval reduction in the volume of ascites. Only trace ascites remains and is insufficient for paracentesis. IMPRESSION: Marked interval reduction in the volume of ascites. The small  volume residual ascites is insufficient for paracentesis. Electronically Signed   By: Jacqulynn Cadet M.D.   On: 12/27/2020 12:44   VAS [place] LOWER EXTREMITY VENOUS (DVT)  Result Date: 12/27/2020  Lower Venous DVT Study Indications: Edema.  Risk Factors: Liver cirrhosis. Limitations: Edema. Comparison Study: No prior study Performing Technologist: Sharion Dove RVS  Examination Guidelines: A complete evaluation includes B-mode imaging, spectral Doppler, color Doppler, and power Doppler as needed of all accessible portions of each vessel. Bilateral testing is considered an integral part of a complete examination. Limited examinations for reoccurring indications may be performed as noted. The reflux portion of the exam is performed with the patient in reverse Trendelenburg.  +---------+---------------+---------+-----------+----------+--------------+ RIGHT    CompressibilityPhasicitySpontaneityPropertiesThrombus Aging +---------+---------------+---------+-----------+----------+--------------+ CFV      Full           Yes      Yes                                 +---------+---------------+---------+-----------+----------+--------------+ SFJ      Full                                                        +---------+---------------+---------+-----------+----------+--------------+ FV Prox  Full                                                        +---------+---------------+---------+-----------+----------+--------------+ FV Mid   Full                                                        +---------+---------------+---------+-----------+----------+--------------+  FV DistalFull                                                        +---------+---------------+---------+-----------+----------+--------------+ PFV      Full                                                        +---------+---------------+---------+-----------+----------+--------------+ POP      Full            Yes      Yes                                 +---------+---------------+---------+-----------+----------+--------------+ PTV      Full                                                        +---------+---------------+---------+-----------+----------+--------------+ PERO     Full                                                        +---------+---------------+---------+-----------+----------+--------------+   +---------+---------------+---------+-----------+----------+--------------+ LEFT     CompressibilityPhasicitySpontaneityPropertiesThrombus Aging +---------+---------------+---------+-----------+----------+--------------+ CFV      Full           Yes      Yes                                 +---------+---------------+---------+-----------+----------+--------------+ SFJ      Full                                                        +---------+---------------+---------+-----------+----------+--------------+ FV Prox  Full                                                        +---------+---------------+---------+-----------+----------+--------------+ FV Mid   Full                                                        +---------+---------------+---------+-----------+----------+--------------+ FV DistalFull                                                        +---------+---------------+---------+-----------+----------+--------------+  PFV      Full                                                        +---------+---------------+---------+-----------+----------+--------------+ POP      Full           Yes      Yes                                 +---------+---------------+---------+-----------+----------+--------------+ PTV      Full                                                        +---------+---------------+---------+-----------+----------+--------------+ PERO     Full                                                         +---------+---------------+---------+-----------+----------+--------------+     Summary: BILATERAL: - No evidence of deep vein thrombosis seen in the lower extremities, bilaterally. -   *See table(s) above for measurements and observations. Electronically signed by Jamelle Haring on 12/27/2020 at 2:45:24 PM.    Final    US Abdomen Limited RUQ (LIVER/GB)  Result Date: 01/07/2021 CLINICAL DATA:  Abdominal pain EXAM: ULTRASOUND ABDOMEN LIMITED RIGHT UPPER QUADRANT COMPARISON:  December 27, 2020 FINDINGS: Gallbladder: There is a large gallstone within the gallbladder that limits evaluation. There appears to be some gallbladder wall thickening with the gallbladder wall measuring 4 mm in thickness. The sonographic Percell Miller sign is negative. Common bile duct: Diameter: 5 mm Liver: The liver surface appears nodular suggestive of underlying cirrhosis. Portal vein is patent on color Doppler imaging with normal direction of blood flow towards the liver. Other: Ascites is noted. IMPRESSION: 1. Cholelithiasis with nonspecific gallbladder wall thickening. This can be seen in the presence of ascites or acute cholecystitis. Consider further evaluation with HIDA scan as clinically indicated. 2. Cirrhotic appearing liver with moderate volume ascites. Electronically Signed   By: Constance Holster M.D.   On: 01/07/2021 01:50   IR Paracentesis  Result Date: 01/07/2021 INDICATION: Patient with history of alcoholic cirrhosis, ascites. Request is made for diagnostic and therapeutic paracentesis. EXAM: ULTRASOUND GUIDED DIAGNOSTIC AND THERAPEUTIC PARACENTESIS MEDICATIONS: 10 mL 1% lidocaine COMPLICATIONS: None immediate. PROCEDURE: Informed written consent was obtained from the patient after a discussion of the risks, benefits and alternatives to treatment. A timeout was performed prior to the initiation of the procedure. Initial ultrasound scanning demonstrates a moderate amount of ascites within the right lower abdominal quadrant.  The right lower abdomen was prepped and draped in the usual sterile fashion. 1% lidocaine was used for local anesthesia. Following this, a 19 gauge, 7-cm, Yueh catheter was introduced. An ultrasound image was saved for documentation purposes. The paracentesis was performed. The catheter was removed and a dressing was applied. The patient tolerated the procedure well without immediate post procedural complication. FINDINGS: A total of approximately 4.0 liters of bright yellow  fluid was removed. IMPRESSION: Successful ultrasound-guided diagnostic and therapeutic paracentesis yielding 4.0 liters of peritoneal fluid. Read by: Brynda Greathouse PA-C Electronically Signed   By: Miachel Roux M.D.   On: 01/07/2021 13:45       Subjective: Patient seen and examined at bedside.  Feels slightly better.  Feels that his abdomen is not that swollen.  No overnight fever, vomiting, worsening abdominal pain.   Discharge Exam: Vitals:   01/09/21 0940 01/09/21 1307  BP: 126/73 135/76  Pulse: 62 65  Resp: 14 14  Temp:  98.2 F (36.8 C)  SpO2: 96% 99%     General exam: Appears calm and comfortable.  Currently on room air Respiratory system: Bilateral decreased breath sounds at bases with scattered crackles Cardiovascular system: S1 & S2 heard, Rate controlled Gastrointestinal system: Abdomen is distended, soft and nontender. Normal bowel sounds heard. Extremities: No cyanosis, clubbing; lower extremity edema present   The results of significant diagnostics from this hospitalization (including imaging, microbiology, ancillary and laboratory) are listed below for reference.     Microbiology: Recent Results (from the past 240 hour(s))  Resp Panel by RT-PCR (Flu A&B, Covid) Nasopharyngeal Swab     Status: None   Collection Time: 01/07/21 12:58 AM   Specimen: Nasopharyngeal Swab; Nasopharyngeal(NP) swabs in vial transport medium  Result Value Ref Range Status   SARS Coronavirus 2 by RT PCR NEGATIVE  NEGATIVE Final    Comment: (NOTE) SARS-CoV-2 target nucleic acids are NOT DETECTED.  The SARS-CoV-2 RNA is generally detectable in upper respiratory specimens during the acute phase of infection. The lowest concentration of SARS-CoV-2 viral copies this assay can detect is 138 copies/mL. A negative result does not preclude SARS-Cov-2 infection and should not be used as the sole basis for treatment or other patient management decisions. A negative result may occur with  improper specimen collection/handling, submission of specimen other than nasopharyngeal swab, presence of viral mutation(s) within the areas targeted by this assay, and inadequate number of viral copies(<138 copies/mL). A negative result must be combined with clinical observations, patient history, and epidemiological information. The expected result is Negative.  Fact Sheet for Patients:  EntrepreneurPulse.com.au  Fact Sheet for Healthcare Providers:  IncredibleEmployment.be  This test is no t yet approved or cleared by the [place] FDA and  has been authorized for detection and/or diagnosis of SARS-CoV-2 by FDA under an Emergency Use Authorization (EUA). This EUA will remain  in effect (meaning this test can be used) for the duration of the COVID-19 declaration under Section 564(b)(1) of the Act, 21 U.S.C.section 360bbb-3(b)(1), unless the authorization is terminated  or revoked sooner.       Influenza A by PCR NEGATIVE NEGATIVE Final   Influenza B by PCR NEGATIVE NEGATIVE Final    Comment: (NOTE) The Xpert Xpress SARS-CoV-2/FLU/RSV plus assay is intended as an aid in the diagnosis of influenza from Nasopharyngeal swab specimens and should not be used as a sole basis for treatment. Nasal washings and aspirates are unacceptable for Xpert Xpress SARS-CoV-2/FLU/RSV testing.  Fact Sheet for Patients: EntrepreneurPulse.com.au  Fact Sheet for Healthcare  Providers: IncredibleEmployment.be  This test is not yet approved or cleared by the [place] FDA and has been authorized for detection and/or diagnosis of SARS-CoV-2 by FDA under an Emergency Use Authorization (EUA). This EUA will remain in effect (meaning this test can be used) for the duration of the COVID-19 declaration under Section 564(b)(1) of the Act, 21 U.S.C. section 360bbb-3(b)(1), unless the authorization is terminated or  revoked.  Performed at [facility], [place] [address], [place], [place] [address]   Gram stain     Status: None   Collection Time: 01/07/21 12:45 PM   Specimen: Abdomen; Peritoneal Fluid  Result Value Ref Range Status   Specimen Description PERITONEAL FLUID  Final   Special Requests ABDOMEN  Final   Gram Stain   Final    RARE WBC PRESENT, PREDOMINANTLY PMN NO ORGANISMS SEEN Performed at [facility], [place] [address], [place], [place] [address]    Report Status 01/08/2021 FINAL  Final  Culture, body fluid-bottle     Status: None (Preliminary result)   Collection Time: 01/07/21 12:45 PM   Specimen: Peritoneal Washings  Result Value Ref Range Status   Specimen Description PERITONEAL FLUID  Final   Special Requests ABDOMEN  Final   Gram Stain PENDING  Incomplete   Culture   Final    NO GROWTH 2 DAYS Performed at [facility] [address] [place], [place] [address]    Report Status PENDING  Incomplete     Labs: BNP (last 3 results) No results for input(s): BNP in the last 8760 hours. Basic Metabolic Panel: Recent Labs  Lab 01/05/21 0940 01/06/21 1926 01/07/21 0804 01/08/21 0213 01/09/21 0325  NA 135 132* 136 135 136  K 3.8 3.9 4.0 4.4 4.3  CL 106 101 104 103 104  CO2 24 22 23 24 24   GLUCOSE 133* 95 92 113* 87  BUN 14 10 11 15 15   CREATININE 0.83 0.83 0.76 0.94 0.95  CALCIUM 8.3* 8.0* 8.1* 8.0* 8.1*  MG  --   --  1.6* 1.8 1.6*   Liver Function Tests: Recent Labs  Lab 01/05/21 0940  01/06/21 1926 01/07/21 0804 01/08/21 0213 01/09/21 0325  AST 170* 153* 143* 119* 146*  ALT 66* 60* 56* 45* 50*  ALKPHOS 263* 264* 213* 231* 225*  BILITOT 14.6* 15.4* 15.3* 12.9* 12.2*  PROT 5.5* 5.9* 5.7* 5.0* 5.3*  ALBUMIN 2.9* 2.4* 2.3* 2.0* 2.1*   Recent Labs  Lab 01/06/21 1926  LIPASE 51   No results for input(s): AMMONIA in the last 168 hours. CBC: Recent Labs  Lab 01/05/21 0940 01/06/21 1926 01/07/21 0804 01/08/21 0213  WBC 8.3 9.6 6.6 7.7  NEUTROABS  --   --  4.0 6.0  HGB 12.6* 12.2* 11.7* 10.9*  HCT 36.5* 34.6* 32.9* 30.5*  MCV 95.9 93.3 93.7 92.1  PLT 50.0 Repeated and verified X2.* 47* 43* 41*   Cardiac Enzymes: No results for input(s): CKTOTAL, CKMB, CKMBINDEX, TROPONINI in the last 168 hours. BNP: Invalid input(s): POCBNP CBG: No results for input(s): GLUCAP in the last 168 hours. D-Dimer No results for input(s): DDIMER in the last 72 hours. Hgb A1c No results for input(s): HGBA1C in the last 72 hours. Lipid Profile No results for input(s): CHOL, HDL, LDLCALC, TRIG, CHOLHDL, LDLDIRECT in the last 72 hours. Thyroid function studies No results for input(s): TSH, T4TOTAL, T3FREE, THYROIDAB in the last 72 hours.  Invalid input(s): FREET3 Anemia work up No results for input(s): VITAMINB12, FOLATE, FERRITIN, TIBC, IRON, RETICCTPCT in the last 72 hours. Urinalysis    Component Value Date/Time   COLORURINE AMBER (A) 01/06/2021 1901   APPEARANCEUR CLEAR 01/06/2021 1901   LABSPEC 1.012 01/06/2021 1901   PHURINE 6.0 01/06/2021 1901   GLUCOSEU NEGATIVE 01/06/2021 1901   HGBUR MODERATE (A) 01/06/2021 1901   BILIRUBINUR SMALL (A) 01/06/2021 1901   BILIRUBINUR small (A) 10/17/2020 1627  KETONESUR NEGATIVE 01/06/2021 1901   PROTEINUR NEGATIVE 01/06/2021 1901   UROBILINOGEN 1.0 10/17/2020 1627   NITRITE NEGATIVE 01/06/2021 1901   LEUKOCYTESUR NEGATIVE 01/06/2021 1901   Sepsis Labs Invalid input(s): PROCALCITONIN,  WBC,  LACTICIDVEN Microbiology Recent  Results (from the past 240 hour(s))  Resp Panel by RT-PCR (Flu A&B, Covid) Nasopharyngeal Swab     Status: None   Collection Time: 01/07/21 12:58 AM   Specimen: Nasopharyngeal Swab; Nasopharyngeal(NP) swabs in vial transport medium  Result Value Ref Range Status   SARS Coronavirus 2 by RT PCR NEGATIVE NEGATIVE Final    Comment: (NOTE) SARS-CoV-2 target nucleic acids are NOT DETECTED.  The SARS-CoV-2 RNA is generally detectable in upper respiratory specimens during the acute phase of infection. The lowest concentration of SARS-CoV-2 viral copies this assay can detect is 138 copies/mL. A negative result does not preclude SARS-Cov-2 infection and should not be used as the sole basis for treatment or other patient management decisions. A negative result may occur with  improper specimen collection/handling, submission of specimen other than nasopharyngeal swab, presence of viral mutation(s) within the areas targeted by this assay, and inadequate number of viral copies(<138 copies/mL). A negative result must be combined with clinical observations, patient history, and epidemiological information. The expected result is Negative.  Fact Sheet for Patients:  EntrepreneurPulse.com.au  Fact Sheet for Healthcare Providers:  IncredibleEmployment.be  This test is no t yet approved or cleared by the [place] FDA and  has been authorized for detection and/or diagnosis of SARS-CoV-2 by FDA under an Emergency Use Authorization (EUA). This EUA will remain  in effect (meaning this test can be used) for the duration of the COVID-19 declaration under Section 564(b)(1) of the Act, 21 U.S.C.section 360bbb-3(b)(1), unless the authorization is terminated  or revoked sooner.       Influenza A by PCR NEGATIVE NEGATIVE Final   Influenza B by PCR NEGATIVE NEGATIVE Final    Comment: (NOTE) The Xpert Xpress SARS-CoV-2/FLU/RSV plus assay is intended as an aid in the  diagnosis of influenza from Nasopharyngeal swab specimens and should not be used as a sole basis for treatment. Nasal washings and aspirates are unacceptable for Xpert Xpress SARS-CoV-2/FLU/RSV testing.  Fact Sheet for Patients: EntrepreneurPulse.com.au  Fact Sheet for Healthcare Providers: IncredibleEmployment.be  This test is not yet approved or cleared by the [place] FDA and has been authorized for detection and/or diagnosis of SARS-CoV-2 by FDA under an Emergency Use Authorization (EUA). This EUA will remain in effect (meaning this test can be used) for the duration of the COVID-19 declaration under Section 564(b)(1) of the Act, 21 U.S.C. section 360bbb-3(b)(1), unless the authorization is terminated or revoked.  Performed at [facility], [place] [address], [place], [place] [address]   Gram stain     Status: None   Collection Time: 01/07/21 12:45 PM   Specimen: Abdomen; Peritoneal Fluid  Result Value Ref Range Status   Specimen Description PERITONEAL FLUID  Final   Special Requests ABDOMEN  Final   Gram Stain   Final    RARE WBC PRESENT, PREDOMINANTLY PMN NO ORGANISMS SEEN Performed at [facility], [place] [address] [place], [place] [address]    Report Status 01/08/2021 FINAL  Final  Culture, body fluid-bottle     Status: None (Preliminary result)   Collection Time: 01/07/21 12:45 PM   Specimen: Peritoneal Washings  Result Value Ref Range Status   Specimen Description PERITONEAL FLUID  Final   Special Requests ABDOMEN  Final   Gram Stain PENDING  Incomplete   Culture   Final    NO GROWTH 2 DAYS Performed at [facility], [place] [address], [place], [place] [address]    Report Status PENDING  Incomplete     Time coordinating discharge: 35 minutes  SIGNED:   Aline August, MD  [facility] 01/09/2021, 2:30 PM

## 2021-01-09 NOTE — Telephone Encounter (Signed)
Scanned to e-mail. Hardcopy in fmla box

## 2021-01-09 NOTE — Telephone Encounter (Signed)
Received a fax from [facility] for paperwork to be filled out by provider. Pt has appt with provider on 2/16. Paperwork will be placed with Yoss[facility]. Please advise.

## 2021-01-09 NOTE — Sedation Documentation (Signed)
Pressures Hepatic vein mean15 Wedge mean 30 IVC mean 13 Right atrial mean 13

## 2021-01-09 NOTE — Progress Notes (Signed)
Patient ID: Ian Hall, male   DOB: Mar 30, 1966, 55 y.o.   MRN: [id number]  PROGRESS NOTE    GIANPAOLO MINDEL  EVO:[id number] DOB: 01-Jun-1966 DOA: 01/06/2021 PCP: Maximiano Coss, NP   Brief Narrative:  55 year old male with history of episodic alcohol abuse/binge drinking, cirrhosis of liver recent admission and discharge from 12/27/2019-12/30/2019 for worsening LFTs for which he was started on oral prednisone and he was also found to have colitis on CT of the abdomen and GI pathogen panel showed enteroaggregative E. coli which was treated with ciprofloxacin was referred for admission by GI for worsening liver function.  On presentation, T bili was 15.4.  GI was consulted.  Assessment & Plan:   Decompensated cirrhosis of liver with ascites and portal hypertension with increasing LFTs and hyperbilirubinemia Recent colitis on CT scan with stool panel positive for enteroaggregative E. coli treated with Cipro -Status post ultrasound-guided paracentesis by IR on 01/07/2021 with removal of 4 L fluid.  Peritoneal fluid did not show signs suggestive of SBP -Prednisolone stopped by GI on 01/07/2021.  Bilirubin still at 12.2 today.  AST and ALT are also elevated slightly compared to yesterday.  MRI abdomen/MRCP showed cirrhosis but no liver mass with cholelithiasis and contracted gallbladder but no choledocholithiasis or biliary ductal dilatation -Patient had recent outpatient EGD and colonoscopy which did not show any varices or portal hypertensive gastropathy and there was erythematous mucosa in the ascending colon and proximal transverse colon -Strict input and output.  Daily weights.  Fluid restriction.  Continue spironolactone and Lasix. -IR is planning for transjugular liver biopsy as per GI recommendations -Repeat a.m. 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  Repeat a.m. labs  Thrombocytopenia -Probably from cirrhosis.  No signs of  bleeding.  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  Monitor  Obesity -Outpatient follow-up  DVT prophylaxis: SCDs Code Status: Full Family Communication: None Disposition Plan: Status is: Inpatient  Remains inpatient appropriate because:Inpatient level of care appropriate due to severity of illness.  Bilirubin still significantly elevated.   Dispo: The patient is from: Home              Anticipated d/c is to: Home              Anticipated d/c date is: 1 day              Patient currently is not medically stable to d/c.   Difficult to place patient No   Consultants: GI  Procedures: Paracentesis on 01/07/2021  Antimicrobials: None   Subjective: Patient seen and examined at bedside.  Denies worsening abdominal pain, shortness of breath, nausea, vomiting or fever. Objective: Vitals:   01/08/21 1108 01/08/21 1626 01/09/21 0103 01/09/21 0548  BP: 99/81 131/72 119/70 118/74  Pulse: 68 67 79 74  Resp: 18 18 18 18   Temp: 97.9 F (36.6 C) 98.4 F (36.9 C) 99.2 F (37.3 C) 98.6 F (37 C)  TempSrc: Oral Oral Oral Oral  SpO2: 99% 98% 96% 96%  Weight:    99.2 kg    Intake/Output Summary (Last 24 hours) at 01/09/2021 0714 Last data filed at 01/08/2021 2200 Gross per 24 hour  Intake 240 ml  Output 1 ml  Net 239 ml   Filed Weights   01/08/21 0537 01/09/21 0548  Weight: 101.8 kg 99.2 kg    Examination:  General exam: No acute distress.  Still on room air. Respiratory system: Decreased breath sounds  at bases bilaterally with some scattered crackles Cardiovascular system: Rate controlled, S1-S2 heard  gastrointestinal system: Abdomen is distended, soft and nontender.  Bowel sounds are heard  extremities: Bilateral lower extremity edema present.  No clubbing  Central nervous system: Awake and alert.  No focal neurological deficits.  Moves extremities  skin: No obvious ecchymosis/lesions Psychiatry: Mood, affect and judgment  are normal    Data Reviewed: I have personally reviewed following labs and imaging studies  CBC: Recent Labs  Lab 01/05/21 0940 01/06/21 1926 01/07/21 0804 01/08/21 0213  WBC 8.3 9.6 6.6 7.7  NEUTROABS  --   --  4.0 6.0  HGB 12.6* 12.2* 11.7* 10.9*  HCT 36.5* 34.6* 32.9* 30.5*  MCV 95.9 93.3 93.7 92.1  PLT 50.0 Repeated and verified X2.* 47* 43* 41*   Basic Metabolic Panel: Recent Labs  Lab 01/05/21 0940 01/06/21 1926 01/07/21 0804 01/08/21 0213 01/09/21 0325  NA 135 132* 136 135 136  K 3.8 3.9 4.0 4.4 4.3  CL 106 101 104 103 104  CO2 24 22 23 24 24   GLUCOSE 133* 95 92 113* 87  BUN 14 10 11 15 15   CREATININE 0.83 0.83 0.76 0.94 0.95  CALCIUM 8.3* 8.0* 8.1* 8.0* 8.1*  MG  --   --  1.6* 1.8 1.6*   GFR: Estimated Creatinine Clearance: 99.7 mL/min (by C-G formula based on SCr of 0.95 mg/dL). Liver Function Tests: Recent Labs  Lab 01/05/21 0940 01/06/21 1926 01/07/21 0804 01/08/21 0213 01/09/21 0325  AST 170* 153* 143* 119* 146*  ALT 66* 60* 56* 45* 50*  ALKPHOS 263* 264* 213* 231* 225*  BILITOT 14.6* 15.4* 15.3* 12.9* 12.2*  PROT 5.5* 5.9* 5.7* 5.0* 5.3*  ALBUMIN 2.9* 2.4* 2.3* 2.0* 2.1*   Recent Labs  Lab 01/06/21 1926  LIPASE 51   No results for input(s): AMMONIA in the last 168 hours. Coagulation Profile: Recent Labs  Lab 01/05/21 0940  INR 1.7*   Cardiac Enzymes: No results for input(s): CKTOTAL, CKMB, CKMBINDEX, TROPONINI in the last 168 hours. BNP (last 3 results) No results for input(s): PROBNP in the last 8760 hours. HbA1C: No results for input(s): HGBA1C in the last 72 hours. CBG: No results for input(s): GLUCAP in the last 168 hours. Lipid Profile: No results for input(s): CHOL, HDL, LDLCALC, TRIG, CHOLHDL, LDLDIRECT in the last 72 hours. Thyroid Function Tests: No results for input(s): TSH, T4TOTAL, FREET4, T3FREE, THYROIDAB in the last 72 hours. Anemia Panel: No results for input(s): VITAMINB12, FOLATE, FERRITIN, TIBC, IRON,  RETICCTPCT in the last 72 hours. Sepsis Labs: No results for input(s): PROCALCITON, LATICACIDVEN in the last 168 hours.  Recent Results (from the past 240 hour(s))  Resp Panel by RT-PCR (Flu A&B, Covid) Nasopharyngeal Swab     Status: None   Collection Time: 01/07/21 12:58 AM   Specimen: Nasopharyngeal Swab; Nasopharyngeal(NP) swabs in vial transport medium  Result Value Ref Range Status   SARS Coronavirus 2 by RT PCR NEGATIVE NEGATIVE Final    Comment: (NOTE) SARS-CoV-2 target nucleic acids are NOT DETECTED.  The SARS-CoV-2 RNA is generally detectable in upper respiratory specimens during the acute phase of infection. The lowest concentration of SARS-CoV-2 viral copies this assay can detect is 138 copies/mL. A negative result does not preclude SARS-Cov-2 infection and should not be used as the sole basis for treatment or other patient management decisions. A negative result may occur with  improper specimen collection/handling, submission of specimen other than nasopharyngeal swab, presence of viral mutation(s) within the  areas targeted by this assay, and inadequate number of viral copies(<138 copies/mL). A negative result must be combined with clinical observations, patient history, and epidemiological information. The expected result is Negative.  Fact Sheet for Patients:  EntrepreneurPulse.com.au  Fact Sheet for Healthcare Providers:  IncredibleEmployment.be  This test is no t yet approved or cleared by the [place] FDA and  has been authorized for detection and/or diagnosis of SARS-CoV-2 by FDA under an Emergency Use Authorization (EUA). This EUA will remain  in effect (meaning this test can be used) for the duration of the COVID-19 declaration under Section 564(b)(1) of the Act, 21 U.S.C.section 360bbb-3(b)(1), unless the authorization is terminated  or revoked sooner.       Influenza A by PCR NEGATIVE NEGATIVE Final   Influenza  B by PCR NEGATIVE NEGATIVE Final    Comment: (NOTE) The Xpert Xpress SARS-CoV-2/FLU/RSV plus assay is intended as an aid in the diagnosis of influenza from Nasopharyngeal swab specimens and should not be used as a sole basis for treatment. Nasal washings and aspirates are unacceptable for Xpert Xpress SARS-CoV-2/FLU/RSV testing.  Fact Sheet for Patients: EntrepreneurPulse.com.au  Fact Sheet for Healthcare Providers: IncredibleEmployment.be  This test is not yet approved or cleared by the [place] FDA and has been authorized for detection and/or diagnosis of SARS-CoV-2 by FDA under an Emergency Use Authorization (EUA). This EUA will remain in effect (meaning this test can be used) for the duration of the COVID-19 declaration under Section 564(b)(1) of the Act, 21 U.S.C. section 360bbb-3(b)(1), unless the authorization is terminated or revoked.  Performed at [facility], [place] [address], [place], [place] [address]   Gram stain     Status: None   Collection Time: 01/07/21 12:45 PM   Specimen: Abdomen; Peritoneal Fluid  Result Value Ref Range Status   Specimen Description PERITONEAL FLUID  Final   Special Requests ABDOMEN  Final   Gram Stain   Final    RARE WBC PRESENT, PREDOMINANTLY PMN NO ORGANISMS SEEN Performed at [facility], [place] [address], [place], [place] [address]    Report Status 01/08/2021 FINAL  Final  Culture, body fluid-bottle     Status: None (Preliminary result)   Collection Time: 01/07/21 12:45 PM   Specimen: Peritoneal Washings  Result Value Ref Range Status   Specimen Description PERITONEAL FLUID  Final   Special Requests ABDOMEN  Final   Gram Stain PENDING  Incomplete   Culture   Final    NO GROWTH < 24 HOURS Performed at [facility], [place] [address], [place], [place] [address]    Report Status PENDING  Incomplete         Radiology Studies: MR ABDOMEN MRCP W WO CONTAST  Result Date:  01/07/2021 CLINICAL DATA:  Inpatient. Jaundice. Cirrhosis with ascites, status post paracentesis. EXAM: MRI ABDOMEN WITHOUT AND WITH CONTRAST (INCLUDING MRCP) TECHNIQUE: Multiplanar multisequence MR imaging of the abdomen was performed both before and after the administration of intravenous contrast. Heavily T2-weighted images of the biliary and pancreatic ducts were obtained, and three-dimensional MRCP images were rendered by post processing. CONTRAST:  54m GADAVIST GADOBUTROL 1 MMOL/ML IV SOLN COMPARISON:  12/24/2020 CT abdomen/pelvis. FINDINGS: Lower chest: No acute abnormality at the lung bases. Hepatobiliary: Diffusely irregular liver surface and relatively atrophic liver compatible with cirrhosis. No hepatic steatosis. No liver mass. There is a 3.6 cm gallstone within the contracted gallbladder. Borderline mild diffuse gallbladder wall thickening. No biliary ductal dilatation. Common bile duct diameter  2 mm. No evidence of choledocholithiasis. No biliary masses, strictures or beading. Pancreas: No pancreatic mass or duct dilation.  No pancreas divisum. Spleen: Mild splenomegaly. Craniocaudal splenic length 14.1 cm. No splenic masses. Adrenals/Urinary Tract: Normal adrenals. No hydronephrosis. Several scattered subcentimeter simple renal cysts in both kidneys. No suspicious renal masses. Stomach/Bowel: Normal non-distended stomach. No dilated small or large bowel loops. Diffuse colonic diverticulosis. Generalized mild wall thickening in the small and large bowel, similar to the recent CT. Vascular/Lymphatic: Normal caliber abdominal aorta. Patent portal, splenic, hepatic and renal veins. Small gastroesophageal and paraumbilical varices. Mild porta hepatis adenopathy up to 1.2 cm (series 26/image 54). Other: Small volume ascites, most prominent in the perihepatic space. No focal fluid collections. Musculoskeletal: No aggressive appearing focal osseous lesions. Small right L2 vertebral hemangioma. IMPRESSION: 1.  Cirrhosis. No liver mass. 2. Mild splenomegaly. Small volume ascites. Small gastroesophageal and paraumbilical varices. 3. Mild porta hepatis adenopathy, nonspecific, more likely reactive. 4. Cholelithiasis with contracted gallbladder. Borderline mild diffuse gallbladder wall thickening, probably due to noninflammatory edema. 5. No biliary ductal dilatation. No evidence of choledocholithiasis. 6. Diffuse colonic diverticulosis. Generalized mild wall thickening in the small and large bowel, similar to the recent CT, favor noninflammatory edema. Electronically Signed   By: Ilona Sorrel M.D.   On: 01/07/2021 20:58   IR Paracentesis  Result Date: 01/07/2021 INDICATION: Patient with history of alcoholic cirrhosis, ascites. Request is made for diagnostic and therapeutic paracentesis. EXAM: ULTRASOUND GUIDED DIAGNOSTIC AND THERAPEUTIC PARACENTESIS MEDICATIONS: 10 mL 1% lidocaine COMPLICATIONS: None immediate. PROCEDURE: Informed written consent was obtained from the patient after a discussion of the risks, benefits and alternatives to treatment. A timeout was performed prior to the initiation of the procedure. Initial ultrasound scanning demonstrates a moderate amount of ascites within the right lower abdominal quadrant. The right lower abdomen was prepped and draped in the usual sterile fashion. 1% lidocaine was used for local anesthesia. Following this, a 19 gauge, 7-cm, Yueh catheter was introduced. An ultrasound image was saved for documentation purposes. The paracentesis was performed. The catheter was removed and a dressing was applied. The patient tolerated the procedure well without immediate post procedural complication. FINDINGS: A total of approximately 4.0 liters of bright yellow fluid was removed. IMPRESSION: Successful ultrasound-guided diagnostic and therapeutic paracentesis yielding 4.0 liters of peritoneal fluid. Read by: Brynda Greathouse PA-C Electronically Signed   By: Miachel Roux M.D.   On: 01/07/2021  13:45        Scheduled Meds: . dicyclomine  10 mg Oral TID AC & HS  . folic acid  1 mg Oral Daily  . furosemide  40 mg Oral Daily  . psyllium  1 packet Oral BID  . spironolactone  100 mg Oral Daily  . thiamine  100 mg Oral Daily   Continuous Infusions: . albumin human            Aline August, MD [facility] 01/09/2021, 7:14 AM

## 2021-01-09 NOTE — Progress Notes (Signed)
Progress Note   Subjective  Chief Complaint: Decompensated cirrhosis, elevated LFTs  He has just returned from transjugular liver biopsy.  Patient reports doing well this morning.  He was up and walking around his room yesterday and tells me he is still getting some abdominal pain after exercise, no further abdominal pain, passed 2 stools this morning.  His main concern is that he gets a doctor's note after he leaves the hospital because he does not feel like he will be able to do his job for at least another few weeks.   Objective   Vital signs in last 24 hours: Temp:  [97.9 F (36.6 C)-99.2 F (37.3 C)] 98.6 F (37 C) (02/11 0548) Pulse Rate:  [60-79] 62 (02/11 0940) Resp:  [12-18] 14 (02/11 0940) BP: (99-131)/(62-81) 126/73 (02/11 0940) SpO2:  [96 %-100 %] 96 % (02/11 0940) FiO2 (%):  [100 %] 100 % (02/11 0840) Weight:  [99.2 kg] 99.2 kg (02/11 0548)   General:    hispanic male in NAD, + clean bandage over neck where a recent transjugular biopsy was done Heart:  Regular rate and rhythm; no murmurs Lungs: Respirations even and unlabored, lungs CTA bilaterally Abdomen:  Soft, nontender and nondistended. Normal bowel sounds. Psych:  Cooperative. Normal mood and affect.  Intake/Output from previous day: 02/10 0701 - 02/11 0700 In: 240 [P.O.:240] Out: 1 [Stool:1]  Lab Results: Recent Labs    01/06/21 1926 01/07/21 0804 01/08/21 0213  WBC 9.6 6.6 7.7  HGB 12.2* 11.7* 10.9*  HCT 34.6* 32.9* 30.5*  PLT 47* 43* 41*   BMET Recent Labs    01/07/21 0804 01/08/21 0213 01/09/21 0325  NA 136 135 136  K 4.0 4.4 4.3  CL 104 103 104  CO2 _0 GLUCOSE 92 113* 87  BUN _1 CREATININE 0.76 0.94 0.95  CALCIUM 8.1* 8.0* 8.1*   Hepatic Function Latest Ref Rng & Units 01/09/2021 01/08/2021 01/07/2021  Total Protein 6.5 - 8.1 g/dL 5.3(L) 5.0(L) 5.7(L)  Albumin 3.5 - 5.0 g/dL 2.1(L) 2.0(L) 2.3(L)  AST 15 - 41 U/L 146(H) 119(H) 143(H)  ALT 0 - 44 U/L 50(H) 45(H) 56(H)   Alk Phosphatase 38 - 126 U/L 225(H) 231(H) 213(H)  Total Bilirubin 0.3 - 1.2 mg/dL 12.2(H) 12.9(H) 15.3(H)  Bilirubin, Direct 0.0 - 0.3 mg/dL - - -   Studies/Results: MR ABDOMEN MRCP W WO CONTAST  Result Date: 01/07/2021 CLINICAL DATA:  Inpatient. Jaundice. Cirrhosis with ascites, status post paracentesis. EXAM: MRI ABDOMEN WITHOUT AND WITH CONTRAST (INCLUDING MRCP) TECHNIQUE: Multiplanar multisequence MR imaging of the abdomen was performed both before and after the administration of intravenous contrast. Heavily T2-weighted images of the biliary and pancreatic ducts were obtained, and three-dimensional MRCP images were rendered by post processing. CONTRAST:  90m GADAVIST GADOBUTROL 1 MMOL/ML IV SOLN COMPARISON:  12/24/2020 CT abdomen/pelvis. FINDINGS: Lower chest: No acute abnormality at the lung bases. Hepatobiliary: Diffusely irregular liver surface and relatively atrophic liver compatible with cirrhosis. No hepatic steatosis. No liver mass. There is a 3.6 cm gallstone within the contracted gallbladder. Borderline mild diffuse gallbladder wall thickening. No biliary ductal dilatation. Common bile duct diameter 2 mm. No evidence of choledocholithiasis. No biliary masses, strictures or beading. Pancreas: No pancreatic mass or duct dilation.  No pancreas divisum. Spleen: Mild splenomegaly. Craniocaudal splenic length 14.1 cm. No splenic masses. Adrenals/Urinary Tract: Normal adrenals. No hydronephrosis. Several scattered subcentimeter simple renal cysts in both kidneys. No suspicious renal masses. Stomach/Bowel: Normal non-distended stomach.  No dilated small or large bowel loops. Diffuse colonic diverticulosis. Generalized mild wall thickening in the small and large bowel, similar to the recent CT. Vascular/Lymphatic: Normal caliber abdominal aorta. Patent portal, splenic, hepatic and renal veins. Small gastroesophageal and paraumbilical varices. Mild porta hepatis adenopathy up to 1.2 cm (series 26/image  54). Other: Small volume ascites, most prominent in the perihepatic space. No focal fluid collections. Musculoskeletal: No aggressive appearing focal osseous lesions. Small right L2 vertebral hemangioma. IMPRESSION: 1. Cirrhosis. No liver mass. 2. Mild splenomegaly. Small volume ascites. Small gastroesophageal and paraumbilical varices. 3. Mild porta hepatis adenopathy, nonspecific, more likely reactive. 4. Cholelithiasis with contracted gallbladder. Borderline mild diffuse gallbladder wall thickening, probably due to noninflammatory edema. 5. No biliary ductal dilatation. No evidence of choledocholithiasis. 6. Diffuse colonic diverticulosis. Generalized mild wall thickening in the small and large bowel, similar to the recent CT, favor noninflammatory edema. Electronically Signed   By: Ilona Sorrel M.D.   On: 01/07/2021 20:58   IR Paracentesis  Result Date: 01/07/2021 INDICATION: Patient with history of alcoholic cirrhosis, ascites. Request is made for diagnostic and therapeutic paracentesis. EXAM: ULTRASOUND GUIDED DIAGNOSTIC AND THERAPEUTIC PARACENTESIS MEDICATIONS: 10 mL 1% lidocaine COMPLICATIONS: None immediate. PROCEDURE: Informed written consent was obtained from the patient after a discussion of the risks, benefits and alternatives to treatment. A timeout was performed prior to the initiation of the procedure. Initial ultrasound scanning demonstrates a moderate amount of ascites within the right lower abdominal quadrant. The right lower abdomen was prepped and draped in the usual sterile fashion. 1% lidocaine was used for local anesthesia. Following this, a 19 gauge, 7-cm, Yueh catheter was introduced. An ultrasound image was saved for documentation purposes. The paracentesis was performed. The catheter was removed and a dressing was applied. The patient tolerated the procedure well without immediate post procedural complication. FINDINGS: A total of approximately 4.0 liters of bright yellow fluid was  removed. IMPRESSION: Successful ultrasound-guided diagnostic and therapeutic paracentesis yielding 4.0 liters of peritoneal fluid. Read by: Brynda Greathouse PA-C Electronically Signed   By: Miachel Roux M.D.   On: 01/07/2021 13:45    Assessment / Plan:    Assessment: 1.  Decompensated cirrhosis with portal hypertension: Recently diagnosed, INR stable, LFTs slightly increased overnight, biopsy done today 2.  Hyperbilirubinemia 3.  Recent colitis on CT scan: Path panel positive for E. coli, treated with Cipro, mild erythema in the ascending colon and proximal transverse colon on colonoscopy a few days ago, biopsies were negative 4.  Porcelain gallbladder 5.  Mild normocytic anemia  Plan: 1.  Patient underwent transjugular liver biopsy today.  LFTs increased slightly overnight, likely patient can be discharged soon with close follow-up in our clinic for biopsy results and further recommendations. 2.  Please await any further recommendations from Dr. Havery Moros later today  Thank you for your kind consultation.   LOS: 2 days   Levin Erp  01/09/2021, 10:23 AM

## 2021-01-09 NOTE — Consult Note (Signed)
Chief Complaint: Patient was seen in consultation today for elevated LFTs  Referring Physician(s): Leshara  Supervising Physician: Mir, Sharen Heck  Patient Status: [facility] - In-pt  History of Present Illness: Ian Hall is a 55 y.o. male with recent diagnosis of cirrhosis admitted with decompsentation with ascites and jaundice.  Patient reports heavy EtOH use many years ago as a younger adult, however none recently, and no recent alcohol intake. Known to IR from paracentesis earlier this week. Fluid studies show no malignant cells, no SBP.  His liver-related labwork remains abnormal with Tbili 12.2, platelets 41, AST 146, ALT 50, INR 1.7.  IR consulted for random liver biopsy at the request of Dr. Havery Moros.   Patient assessed in IR.  Reports fatigue but otherwise feeling well.  States he did get significant relief with paracentesis several days ago and feels he may have reaccumulated a small amount of fluid.  Case and labwork reviewed by Dr. Dwaine Gale who approves patient for transjugular liver biopsy today.  Ian Hall has been NPO since yesterday afternoon.   Past Medical History:  Diagnosis Date  . Anxiety   . COVID-19     Past Surgical History:  Procedure Laterality Date  . ANKLE SURGERY Right   . IR PARACENTESIS  01/07/2021    Allergies: Hydrocodone  Medications: Prior to Admission medications   Medication Sig Start Date End Date Taking? Authorizing Provider  dicyclomine (BENTYL) 10 MG capsule Take 1 capsule (10 mg total) by mouth 4 (four) times daily -  before meals and at bedtime. 12/16/20  Yes Thornton Park, MD  folic acid (FOLVITE) 1 MG tablet Take 1 tablet (1 mg total) by mouth daily. 12/29/20  Yes Shelly Coss, MD  furosemide (LASIX) 40 MG tablet Take 1 tablet (40 mg total) by mouth daily. 01/05/21  Yes Thornton Park, MD  predniSONE (DELTASONE) 20 MG tablet Take 40 mg by mouth See admin instructions. Qd x 24 days   Yes [provider]  psyllium  (METAMUCIL SMOOTH TEXTURE) 28 % packet Take 1 packet by mouth 2 (two) times daily.   Yes [provider]  spironolactone (ALDACTONE) 100 MG tablet Take 1 tablet (100 mg total) by mouth daily. 01/05/21  Yes Thornton Park, MD  thiamine 100 MG tablet Take 1 tablet (100 mg total) by mouth daily. Patient not taking: Reported on 01/07/2021 12/29/20   Shelly Coss, MD     Family History  Problem Relation Age of Onset  . Diabetes Mother   . Memory loss Mother   . Diabetes Father   . Diabetes Cousin     Social History   Socioeconomic History  . Marital status: Married    Spouse name: Not on file  . Number of children: 0  . Years of education: Not on file  . Highest education level: Not on file  Occupational History  . Occupation: IT sales professional: [employer]  Tobacco Use  . Smoking status: Former Smoker    Types: Cigarettes    Quit date: 12/16/2002    Years since quitting: 18.0  . Smokeless tobacco: Never Used  Vaping Use  . Vaping Use: Never used  Substance and Sexual Activity  . Alcohol use: Not Currently    Comment: none for 3-4 years (stated 12/16/2020)  . Drug use: No  . Sexual activity: Yes    Birth control/protection: None  Other Topics Concern  . Not on file  Social History Narrative  . Not on file   Social  Determinants of Health   Financial Resource Strain: Not on file  Food Insecurity: Not on file  Transportation Needs: Not on file  Physical Activity: Not on file  Stress: Not on file  Social Connections: Not on file     Review of Systems: A 12 point ROS discussed and pertinent positives are indicated in the HPI above.  All other systems are negative.  Review of Systems  Constitutional: Positive for fatigue. Negative for fever.  Respiratory: Negative for cough and shortness of breath.   Cardiovascular: Negative for chest pain.  Gastrointestinal: Positive for abdominal distention. Negative for abdominal pain, nausea and  vomiting.  Genitourinary: Negative for dysuria.  Musculoskeletal: Negative for back pain.  Psychiatric/Behavioral: Negative for behavioral problems and confusion.    Vital Signs: BP 119/68 (BP Location: Right Arm)   Pulse 63   Temp 98.6 F (37 C) (Oral)   Resp 16   Wt 218 lb 12.8 oz (99.2 kg)   SpO2 100%   BMI 34.27 kg/m   Physical Exam Vitals and nursing note reviewed.  Constitutional:      General: He is not in acute distress.    Appearance: He is well-developed. He is not ill-appearing.  Cardiovascular:     Rate and Rhythm: Normal rate and regular rhythm.  Pulmonary:     Effort: Pulmonary effort is normal.     Breath sounds: Normal breath sounds.  Abdominal:     General: Abdomen is flat. There is no distension.  Skin:    General: Skin is warm and dry.  Neurological:     General: No focal deficit present.     Mental Status: He is alert and oriented to person, place, and time.      MD Evaluation Airway: WNL Heart: WNL Abdomen: WNL Chest/ Lungs: WNL ASA  Classification: 3 Mallampati/Airway Score: One   Imaging: CT Abdomen Pelvis W Contrast  Addendum Date: 12/24/2020   ADDENDUM REPORT: 12/24/2020 17:19 ADDENDUM: These results were called by telephone at the time of interpretation on 12/24/2020 at 5:19 pm to provider Dr. Lucio Edward, Who verbally acknowledged these results. Electronically Signed   By: Markus Daft M.D.   On: 12/24/2020 17:19   Result Date: 12/24/2020 CLINICAL DATA:  55 year old with acute abdominal pain. Bloody stools. Blood in urine. Weight loss. Thrombocytopenia. EXAM: CT ABDOMEN AND PELVIS WITH CONTRAST TECHNIQUE: Multidetector CT imaging of the abdomen and pelvis was performed using the standard protocol following bolus administration of intravenous contrast. CONTRAST:  199m OMNIPAQUE IOHEXOL 300 MG/ML  SOLN COMPARISON:  Ultrasound 02/04/2017.  CT report from 05/25/2003 FINDINGS: Lower chest: Subtle patchy densities in the right middle lobe  concerning for infectious or inflammatory process. No large pleural effusions. Hepatobiliary: Liver has a nodular contour. Enlargement of the caudate lobe. Findings are compatible with cirrhosis. There is moderate amount of perihepatic ascites. No suspicious liver lesion. Main portal veins are patent. The gallbladder wall is either calcified or there is a very large stone in the gallbladder measuring up to 3.7 cm. 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 No significant biliary dilatation. Pancreas: Unremarkable. No pancreatic ductal dilatation or surrounding inflammatory changes. Spleen: Spleen is prominent for size and measures 14.1 cm in the craniocaudal dimension. No focal splenic lesion. There is perisplenic ascites. Adrenals/Urinary Tract: Normal appearance of the adrenal glands. Low-density structure of the right kidney upper pole likely represents a cyst. No suspicious renal lesion. No hydronephrosis. Fluid in the urinary bladder. Stomach/Bowel: Diverticula in the sigmoid colon and  descending colon. Colonic wall thickening at the hepatic flexure and ascending colon. Colon wall thickening is well demonstrated on sequence 2 image 36. There is oral contrast throughout the colon and no evidence for obstruction. Few mildly dilated loops of small bowel throughout the abdomen. Small amount of fluid around the stomach without gross abnormality to the stomach. Vascular/Lymphatic: Normal caliber of the abdominal aorta without aneurysm or significant atherosclerotic disease. Main visceral arteries are patent. Bilateral iliac arteries are patent without significant stenosis. Proximal femoral arteries are patent. Portal venous system is patent. SMV is patent. Splenic vein appears to be patent. No significant gastric or esophageal varices. Slightly prominent mesenteric lymph nodes. Overall, no significant lymph node enlargement in the abdomen or pelvis. Reproductive: Prostate is prominent  measuring 4.6 cm in the transverse dimension. Other: Small to moderate amount ascites in the lower abdomen and pelvis. Moderate amount ascites around the liver. Diffuse mesenteric edema. Subcutaneous edema around the umbilicus. Bilateral subcutaneous edema in the abdominal oblique regions. Musculoskeletal: No acute bone abnormality. IMPRESSION: 1. Cirrhosis with moderate amount ascites and splenomegaly. Findings are compatible with portal hypertension. 2. Wall thickening involving the ascending colon and hepatic flexure. Some of the wall thickening could be associated with the adjacent ascites but also concerning for colitis involving the right colon. 3. Subtle patchy densities in the right middle lobe could represent mild infection or inflammation. 4. Extensive calcifications involving the gallbladder. 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 There may be gallstones near the gallbladder base. Electronically Signed: By: Markus Daft M.D. On: 12/24/2020 17:02   US Abdomen Limited  Result Date: 12/26/2020 CLINICAL DATA:  Abdominal swelling EXAM: ULTRASOUND ABDOMEN LIMITED RIGHT UPPER QUADRANT COMPARISON:  CT abdomen 12/24/2020 FINDINGS: Gallbladder: Calcified gallbladder wall versus large gallstone in the gallbladder fossa. No significant interval change compared with the prior CT of 12/24/2020. No sonographic Murphy sign noted by sonographer. Common bile duct: Diameter: 4 mm Liver: No focal hepatic mass. Nodular contour of the liver with a heterogeneous echotexture. Portal vein is patent on color Doppler imaging with normal direction of blood flow towards the liver. Other: Abdominal ascites. IMPRESSION: 1. Calcified gallbladder wall versus large gallstone in the gallbladder fossa. No significant interval change compared with prior CT of 12/24/2020. 2. Cirrhosis. No focal hepatic mass.  Abdominal ascites. Electronically Signed   By:  Kathreen Devoid   On: 12/26/2020 16:44   DG Chest Portable 1 View  Result Date: 12/26/2020 CLINICAL DATA:  Cough EXAM: PORTABLE CHEST 1 VIEW COMPARISON:  03/22/2018 FINDINGS: The heart size and mediastinal contours are within normal limits. Mild bibasilar interstitial prominence. No focal airspace consolidation, pleural effusion, or pneumothorax. The visualized skeletal structures are unremarkable. IMPRESSION: Mild bibasilar interstitial prominence, may reflect bronchitic type lung changes versus developing atypical/viral infection. Electronically Signed   By: Davina Poke D.O.   On: 12/26/2020 13:44   MR ABDOMEN MRCP W WO CONTAST  Result Date: 01/07/2021 CLINICAL DATA:  Inpatient. Jaundice. Cirrhosis with ascites, status post paracentesis. EXAM: MRI ABDOMEN WITHOUT AND WITH CONTRAST (INCLUDING MRCP) TECHNIQUE: Multiplanar multisequence MR imaging of the abdomen was performed both before and after the administration of intravenous contrast. Heavily T2-weighted images of the biliary and pancreatic ducts were obtained, and three-dimensional MRCP images were rendered by post processing. CONTRAST:  68m GADAVIST GADOBUTROL 1 MMOL/ML IV SOLN COMPARISON:  12/24/2020 CT abdomen/pelvis. FINDINGS: Lower chest: No acute abnormality at the lung bases. Hepatobiliary:  Diffusely irregular liver surface and relatively atrophic liver compatible with cirrhosis. No hepatic steatosis. No liver mass. There is a 3.6 cm gallstone within the contracted gallbladder. Borderline mild diffuse gallbladder wall thickening. No biliary ductal dilatation. Common bile duct diameter 2 mm. No evidence of choledocholithiasis. No biliary masses, strictures or beading. Pancreas: No pancreatic mass or duct dilation.  No pancreas divisum. Spleen: Mild splenomegaly. Craniocaudal splenic length 14.1 cm. No splenic masses. Adrenals/Urinary Tract: Normal adrenals. No hydronephrosis. Several scattered subcentimeter simple renal cysts in both kidneys. No  suspicious renal masses. Stomach/Bowel: Normal non-distended stomach. No dilated small or large bowel loops. Diffuse colonic diverticulosis. Generalized mild wall thickening in the small and large bowel, similar to the recent CT. Vascular/Lymphatic: Normal caliber abdominal aorta. Patent portal, splenic, hepatic and renal veins. Small gastroesophageal and paraumbilical varices. Mild porta hepatis adenopathy up to 1.2 cm (series 26/image 54). Other: Small volume ascites, most prominent in the perihepatic space. No focal fluid collections. Musculoskeletal: No aggressive appearing focal osseous lesions. Small right L2 vertebral hemangioma. IMPRESSION: 1. Cirrhosis. No liver mass. 2. Mild splenomegaly. Small volume ascites. Small gastroesophageal and paraumbilical varices. 3. Mild porta hepatis adenopathy, nonspecific, more likely reactive. 4. Cholelithiasis with contracted gallbladder. Borderline mild diffuse gallbladder wall thickening, probably due to noninflammatory edema. 5. No biliary ductal dilatation. No evidence of choledocholithiasis. 6. Diffuse colonic diverticulosis. Generalized mild wall thickening in the small and large bowel, similar to the recent CT, favor noninflammatory edema. Electronically Signed   By: Ilona Sorrel M.D.   On: 01/07/2021 20:58   [place] ASCITES (ABDOMEN LIMITED)  Result Date: 01/07/2021 CLINICAL DATA:  Ascites EXAM: LIMITED ABDOMEN ULTRASOUND FOR ASCITES TECHNIQUE: Limited ultrasound survey for ascites was performed in all four abdominal quadrants. COMPARISON:  03/26/2021 FINDINGS: There is a small to moderate volume of abdominal ascites, primarily in the upper abdomen. The liver is cirrhotic. IMPRESSION: Small to moderate volume of ascites, primarily in the upper abdomen. Electronically Signed   By: Constance Holster M.D.   On: 01/07/2021 01:51   [place] ASCITES (ABDOMEN LIMITED)  Result Date: 12/27/2020 CLINICAL DATA:  55 year old male with ascites presenting for possible  paracentesis. Recently started on Lasix. EXAM: LIMITED ABDOMEN ULTRASOUND FOR ASCITES TECHNIQUE: Limited ultrasound survey for ascites was performed in all four abdominal quadrants. COMPARISON:  Abdominal ultrasound 12/26/2020 FINDINGS: Marked interval reduction in the volume of ascites. Only trace ascites remains and is insufficient for paracentesis. IMPRESSION: Marked interval reduction in the volume of ascites. The small volume residual ascites is insufficient for paracentesis. Electronically Signed   By: Jacqulynn Cadet M.D.   On: 12/27/2020 12:44   VAS [place] LOWER EXTREMITY VENOUS (DVT)  Result Date: 12/27/2020  Lower Venous DVT Study Indications: Edema.  Risk Factors: Liver cirrhosis. Limitations: Edema. Comparison Study: No prior study Performing Technologist: Sharion Dove RVS  Examination Guidelines: A complete evaluation includes B-mode imaging, spectral Doppler, color Doppler, and power Doppler as needed of all accessible portions of each vessel. Bilateral testing is considered an integral part of a complete examination. Limited examinations for reoccurring indications may be performed as noted. The reflux portion of the exam is performed with the patient in reverse Trendelenburg.  +---------+---------------+---------+-----------+----------+--------------+ RIGHT    CompressibilityPhasicitySpontaneityPropertiesThrombus Aging +---------+---------------+---------+-----------+----------+--------------+ CFV      Full           Yes      Yes                                 +---------+---------------+---------+-----------+----------+--------------+  SFJ      Full                                                        +---------+---------------+---------+-----------+----------+--------------+ FV Prox  Full                                                        +---------+---------------+---------+-----------+----------+--------------+ FV Mid   Full                                                         +---------+---------------+---------+-----------+----------+--------------+ FV DistalFull                                                        +---------+---------------+---------+-----------+----------+--------------+ PFV      Full                                                        +---------+---------------+---------+-----------+----------+--------------+ POP      Full           Yes      Yes                                 +---------+---------------+---------+-----------+----------+--------------+ PTV      Full                                                        +---------+---------------+---------+-----------+----------+--------------+ PERO     Full                                                        +---------+---------------+---------+-----------+----------+--------------+   +---------+---------------+---------+-----------+----------+--------------+ LEFT     CompressibilityPhasicitySpontaneityPropertiesThrombus Aging +---------+---------------+---------+-----------+----------+--------------+ CFV      Full           Yes      Yes                                 +---------+---------------+---------+-----------+----------+--------------+ SFJ      Full                                                        +---------+---------------+---------+-----------+----------+--------------+  FV Prox  Full                                                        +---------+---------------+---------+-----------+----------+--------------+ FV Mid   Full                                                        +---------+---------------+---------+-----------+----------+--------------+ FV DistalFull                                                        +---------+---------------+---------+-----------+----------+--------------+ PFV      Full                                                         +---------+---------------+---------+-----------+----------+--------------+ POP      Full           Yes      Yes                                 +---------+---------------+---------+-----------+----------+--------------+ PTV      Full                                                        +---------+---------------+---------+-----------+----------+--------------+ PERO     Full                                                        +---------+---------------+---------+-----------+----------+--------------+     Summary: BILATERAL: - No evidence of deep vein thrombosis seen in the lower extremities, bilaterally. -   *See table(s) above for measurements and observations. Electronically signed by Jamelle Haring on 12/27/2020 at 2:45:24 PM.    Final    US Abdomen Limited RUQ (LIVER/GB)  Result Date: 01/07/2021 CLINICAL DATA:  Abdominal pain EXAM: ULTRASOUND ABDOMEN LIMITED RIGHT UPPER QUADRANT COMPARISON:  December 27, 2020 FINDINGS: Gallbladder: There is a large gallstone within the gallbladder that limits evaluation. There appears to be some gallbladder wall thickening with the gallbladder wall measuring 4 mm in thickness. The sonographic Percell Miller sign is negative. Common bile duct: Diameter: 5 mm Liver: The liver surface appears nodular suggestive of underlying cirrhosis. Portal vein is patent on color Doppler imaging with normal direction of blood flow towards the liver. Other: Ascites is noted. IMPRESSION: 1. Cholelithiasis with nonspecific gallbladder wall thickening. This can be seen in the presence of ascites or acute cholecystitis. Consider further evaluation with HIDA scan as clinically indicated. 2.  Cirrhotic appearing liver with moderate volume ascites. Electronically Signed   By: Constance Holster M.D.   On: 01/07/2021 01:50   IR Paracentesis  Result Date: 01/07/2021 INDICATION: Patient with history of alcoholic cirrhosis, ascites. Request is made for diagnostic and therapeutic  paracentesis. EXAM: ULTRASOUND GUIDED DIAGNOSTIC AND THERAPEUTIC PARACENTESIS MEDICATIONS: 10 mL 1% lidocaine COMPLICATIONS: None immediate. PROCEDURE: Informed written consent was obtained from the patient after a discussion of the risks, benefits and alternatives to treatment. A timeout was performed prior to the initiation of the procedure. Initial ultrasound scanning demonstrates a moderate amount of ascites within the right lower abdominal quadrant. The right lower abdomen was prepped and draped in the usual sterile fashion. 1% lidocaine was used for local anesthesia. Following this, a 19 gauge, 7-cm, Yueh catheter was introduced. An ultrasound image was saved for documentation purposes. The paracentesis was performed. The catheter was removed and a dressing was applied. The patient tolerated the procedure well without immediate post procedural complication. FINDINGS: A total of approximately 4.0 liters of bright yellow fluid was removed. IMPRESSION: Successful ultrasound-guided diagnostic and therapeutic paracentesis yielding 4.0 liters of peritoneal fluid. Read by: Brynda Greathouse PA-C Electronically Signed   By: Miachel Roux M.D.   On: 01/07/2021 13:45    Labs:  CBC: Recent Labs    01/05/21 0940 01/06/21 1926 01/07/21 0804 01/08/21 0213  WBC 8.3 9.6 6.6 7.7  HGB 12.6* 12.2* 11.7* 10.9*  HCT 36.5* 34.6* 32.9* 30.5*  PLT 50.0 Repeated and verified X2.* 47* 43* 41*    COAGS: Recent Labs    12/22/20 1045 12/24/20 1333 12/27/20 0046 01/05/21 0940  INR 1.7* 1.7* 1.7* 1.7*    BMP: Recent Labs    10/17/20 1716 10/29/20 1344 01/06/21 1926 01/07/21 0804 01/08/21 0213 01/09/21 0325  NA 138   < > 132* 136 135 136  K 3.6   < > 3.9 4.0 4.4 4.3  CL 106   < > 101 104 103 104  CO2 22   < > 22 23 24 24   GLUCOSE 143*   < > 95 92 113* 87  BUN 8   < > 10 11 15 15   CALCIUM 8.3*   < > 8.0* 8.1* 8.0* 8.1*  CREATININE 0.70*   < > 0.83 0.76 0.94 0.95  GFRNONAA 107   < > >60 >60 >60 >60   GFRAA 124  --   --   --   --   --    < > = values in this interval not displayed.    LIVER FUNCTION TESTS: Recent Labs    01/06/21 1926 01/07/21 0804 01/08/21 0213 01/09/21 0325  BILITOT 15.4* 15.3* 12.9* 12.2*  AST 153* 143* 119* 146*  ALT 60* 56* 45* 50*  ALKPHOS 264* 213* 231* 225*  PROT 5.9* 5.7* 5.0* 5.3*  ALBUMIN 2.4* 2.3* 2.0* 2.1*    TUMOR MARKERS: No results for input(s): AFPTM, CEA, CA199, CHROMGRNA in the last 8760 hours.  Assessment and Plan: Hepatitis vs. Decompensated cirrhosis Patient admitted with abnormal LFTs, elevated Tbili, elevated INR without known cause.  S/p paracentesis 2/9 with 4.0 liters removed.  After consultation with GI, plan made to proceed with liver biopsy today.  Case reviewed by Dr. Dwaine Gale who approves patient for transjugular liver biopsy.  No additional platelets needed at this time.  Ian Hall has been NPO. He is not on blood thinners; auto-anticoagulated with INR 1.7 in setting of liver dysfunction.   Risks and benefits of biopsy was discussed with  the patient and/or patient's family including, but not limited to bleeding, infection, damage to adjacent structures or low yield requiring additional tests.  All of the questions were answered and there is agreement to proceed.  Consent signed and in chart.  Thank you for this interesting consult.  I greatly enjoyed meeting Ian Hall and look forward to participating in their care.  A copy of this report was sent to the requesting provider on this date.  Electronically Signed: Docia Barrier, PA 01/09/2021, 8:45 AM   I spent a total of 40 Minutes    in face to face in clinical consultation, greater than 50% of which was counseling/coordinating care for jaundice, hepatitis.

## 2021-01-09 NOTE — Telephone Encounter (Signed)
Needs new OV not seen for disability reason within appropriate time frame

## 2021-01-09 NOTE — Telephone Encounter (Signed)
Patient already scheduled for HFU on 01/14/21.

## 2021-01-11 LAB — CULTURE, BODY FLUID W GRAM STAIN -BOTTLE: Culture: NO GROWTH

## 2021-01-12 ENCOUNTER — Telehealth: Payer: Self-pay | Admitting: Gastroenterology

## 2021-01-12 ENCOUNTER — Telehealth: Payer: Self-pay

## 2021-01-12 LAB — SURGICAL PATHOLOGY

## 2021-01-12 NOTE — Telephone Encounter (Signed)
Please review and advise. He was admitted to hospital so I am not able to answer this question. In addition, are we keeping follow up as scheduled?

## 2021-01-12 NOTE — Telephone Encounter (Signed)
dicyclomine 10 MG capsule Commonly known as: BENTYL Take 1 capsule (10 mg total) by mouth 4 (four) times daily -  before meals and at bedtime.    folic acid 1 MG tablet Commonly known as: FOLVITE Take 1 tablet (1 mg total) by mouth daily.    furosemide 40 MG tablet Commonly known as: Lasix Take 1 tablet (40 mg total) by mouth daily.    psyllium 28 % packet Commonly known as: METAMUCIL SMOOTH TEXTURE Take 1 packet by mouth 2 (two) times daily.    spironolactone 100 MG tablet Commonly known as: Aldactone Take 1 tablet (100 mg total) by mouth daily.    thiamine 100 MG tablet Take 1 tablet (100 mg total) by mouth daily    These are the medications listed on hospital discharge. Please find out what his questions might be. He also has a clinic appointment with me later this week.

## 2021-01-12 NOTE — Telephone Encounter (Signed)
Transition Care Management Follow-up Telephone Call  Date of discharge and from where: 01/09/2021 from [facility]  How have you been since you were released from the hospital? Pt states that he is still having some pain and has appointments this week with PCP and GI.   Any questions or concerns? No  Items Reviewed:  Did the pt receive and understand the discharge instructions provided? Yes   Medications obtained and verified? Yes   Other? No   Any new allergies since your discharge? No   Dietary orders reviewed? Heart Healthy  Do you have support at home? Yes    Functional Questionnaire: (I = Independent and D = Dependent) ADLs: I  Bathing/Dressing- I  Meal Prep- I  Eating- I  Maintaining continence- I  Transferring/Ambulation- I  Managing Meds- I  Follow up appointments reviewed:   PCP Hospital f/u appt confirmed? Yes  Scheduled to see Maximiano Coss, NP on 01/14/21 @ 3:30pm.  [facility] f/u appt confirmed? Yes  Scheduled to see Thornton Park, MD on 01/16/21 @ 1:50pm.  Are transportation arrangements needed? No  If their condition worsens, is the pt aware to call PCP or go to the Emergency Dept.? Yes Was the patient provided with contact information for the PCP's office or ED? Yes Was to pt encouraged to call back with questions or concerns? Yes

## 2021-01-12 NOTE — Telephone Encounter (Signed)
Attempted to call pt for him to elaborate further about his question/concern. Also sent to the following My Chart message:  Ian Hall,   I have attempted to reach you by phone but was unsuccessful in my attempt. To ensure your questions/concerns are addressed in a timely, efficient and effective manner, please elaborate further.   It appears you were provided with discharge instructions from the hospital with the following medication list provided:   dicyclomine 10 MG capsule Commonly known as: BENTYL Take 1 capsule (10 mg total) by mouth 4 (four) times daily -  before meals and at bedtime.    folic acid 1 MG tablet Commonly known as: FOLVITE Take 1 tablet (1 mg total) by mouth daily.    furosemide 40 MG tablet Commonly known as: Lasix Take 1 tablet (40 mg total) by mouth daily.    psyllium 28 % packet Commonly known as: METAMUCIL SMOOTH TEXTURE Take 1 packet by mouth 2 (two) times daily.    spironolactone 100 MG tablet Commonly known as: Aldactone Take 1 tablet (100 mg total) by mouth daily.    thiamine 100 MG tablet Take 1 tablet (100 mg total) by mouth daily    Are you in need of refills or did you have a specific question or concern pertaining to your symptoms post-discharge, your discharge instructions provided to you by the hospital staff and/or questions about your medication list above. In addition, you do have a follow up appointment with Dr. Orvan Falconer on 01/16/21 @ 1:50pm. Is your question regarding when you are to follow up with Dr. Orvan Falconer after being discharged from the hospital? We are happy to address your concerns and/or questions but we are just needing additional information to better support and assist your needs.   Thank you,   Dr. Orvan Falconer and [facility] Gastroenterology Team

## 2021-01-13 ENCOUNTER — Encounter (HOSPITAL_COMMUNITY): Payer: Self-pay

## 2021-01-14 ENCOUNTER — Other Ambulatory Visit: Payer: Self-pay

## 2021-01-14 ENCOUNTER — Ambulatory Visit: Payer: No Typology Code available for payment source | Admitting: Registered Nurse

## 2021-01-14 ENCOUNTER — Encounter: Payer: Self-pay | Admitting: Registered Nurse

## 2021-01-14 ENCOUNTER — Telehealth: Payer: Self-pay | Admitting: Gastroenterology

## 2021-01-14 VITALS — BP 132/72 | HR 89 | Temp 98.0°F | Resp 18 | Ht 67.0 in | Wt 221.2 lb

## 2021-01-14 DIAGNOSIS — D638 Anemia in other chronic diseases classified elsewhere: Secondary | ICD-10-CM

## 2021-01-14 DIAGNOSIS — K7460 Unspecified cirrhosis of liver: Secondary | ICD-10-CM

## 2021-01-14 DIAGNOSIS — R8271 Bacteriuria: Secondary | ICD-10-CM

## 2021-01-14 DIAGNOSIS — R768 Other specified abnormal immunological findings in serum: Secondary | ICD-10-CM | POA: Diagnosis not present

## 2021-01-14 DIAGNOSIS — K760 Fatty (change of) liver, not elsewhere classified: Secondary | ICD-10-CM

## 2021-01-14 DIAGNOSIS — K7290 Hepatic failure, unspecified without coma: Secondary | ICD-10-CM | POA: Diagnosis not present

## 2021-01-14 NOTE — Telephone Encounter (Signed)
Joelene Millin, forwarding liver biopsy results that just came back from this patient's recent inpatient stay.  FINAL MICROSCOPIC DIAGNOSIS:   A. 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 See comment  - Marked Cholestasis     COMMENT:   The liver biopsy shows a nodular architecture with areas of fibrotic  parenchymal collapse. Trichrome stain highlights fibrous septa that  divide the parenchyma into small nodules and reticulin stain highlights  the regenerative nodularity. Hepatic nodules show marked hepatocellular  and canalicular as well as ductular cholestasis. There is associated  feathery degeneration. Minimal steatosis is seen without significant  steatohepatitis. Numerous glycogenated nuclei are seen, suggestive of  insulin resistance. Portal tracts show moderate, mostly mononuclear  inflammation without significant interface hepatitis. A ductular  reaction, mild in degree, is present at the periphery of the septa. Iron  stain shows no stainable iron. PASD stain is negative for diastase  resistant inclusions in hepatocytes but highlights ceroid-related  macrophages.   The findings are consistent with cirrhosis of uncertain etiology.  Findings can be compatible with burnt out steatohepatitis, alcoholic  and/or non-alcoholic. Though cholestasis can be seen in cirrhosis, the  degree of cholestasis in this case may suggest overlapping drug-induced  liver injury. Clinical correlation is suggested.     It looks like he has follow up scheduled with you this Friday, and he was supposed to get repeat labs this week, I don't see that he has done that yet. Looks like your team has been trying to get ahold of him as well. Thanks

## 2021-01-14 NOTE — Patient Instructions (Signed)
° ° ° °  If you have lab work done today you will be contacted with your lab results within the next 2 weeks.  If you have not heard from us then please contact us. The fastest way to get your results is to register for My Chart. ° ° °IF you received an x-ray today, you will receive an invoice from [facility]. Please contact [facility] at [phone] with questions or concerns regarding your invoice.  ° °IF you received labwork today, you will receive an invoice from [facility]. Please contact [facility] at [phone] with questions or concerns regarding your invoice.  ° °Our billing staff will not be able to assist you with questions regarding bills from these companies. ° °You will be contacted with the lab results as soon as they are available. The fastest way to get your results is to activate your My Chart account. Instructions are located on the last page of this paperwork. If you have not heard from us regarding the results in 2 weeks, please contact this office. °  ° ° ° °

## 2021-01-14 NOTE — Telephone Encounter (Signed)
Thanks. I will review these results with him on Friday.

## 2021-01-15 ENCOUNTER — Telehealth: Payer: Self-pay | Admitting: Registered Nurse

## 2021-01-15 LAB — CBC WITH DIFFERENTIAL
Basophils Absolute: 0.1 10*3/uL (ref 0.0–0.2)
Basos: 1 %
EOS (ABSOLUTE): 0.1 10*3/uL (ref 0.0–0.4)
Eos: 2 %
Hematocrit: 33.5 % — ABNORMAL LOW (ref 37.5–51.0)
Hemoglobin: 12.3 g/dL — ABNORMAL LOW (ref 13.0–17.7)
Immature Grans (Abs): 0.1 10*3/uL (ref 0.0–0.1)
Immature Granulocytes: 1 %
Lymphocytes Absolute: 1.5 10*3/uL (ref 0.7–3.1)
Lymphs: 17 %
MCH: 32.4 pg (ref 26.6–33.0)
MCHC: 36.7 g/dL — ABNORMAL HIGH (ref 31.5–35.7)
MCV: 88 fL (ref 79–97)
Monocytes Absolute: 0.9 10*3/uL (ref 0.1–0.9)
Monocytes: 11 %
Neutrophils Absolute: 6.1 10*3/uL (ref 1.4–7.0)
Neutrophils: 68 %
RBC: 3.8 x10E6/uL — ABNORMAL LOW (ref 4.14–5.80)
RDW: 17.5 % — ABNORMAL HIGH (ref 11.6–15.4)
WBC: 8.8 10*3/uL (ref 3.4–10.8)

## 2021-01-15 LAB — URINALYSIS
Bilirubin, UA: NEGATIVE
Glucose, UA: NEGATIVE
Ketones, UA: NEGATIVE
Nitrite, UA: POSITIVE — AB
Specific Gravity, UA: 1.024 (ref 1.005–1.030)
Urobilinogen, Ur: 2 mg/dL — ABNORMAL HIGH (ref 0.2–1.0)
pH, UA: 5.5 (ref 5.0–7.5)

## 2021-01-15 LAB — ANA W/REFLEX: Anti Nuclear Antibody (ANA): NEGATIVE

## 2021-01-15 LAB — COMPREHENSIVE METABOLIC PANEL
ALT: 47 IU/L — ABNORMAL HIGH (ref 0–44)
AST: 139 IU/L — ABNORMAL HIGH (ref 0–40)
Albumin/Globulin Ratio: 1 — ABNORMAL LOW (ref 1.2–2.2)
Albumin: 2.9 g/dL — ABNORMAL LOW (ref 3.8–4.9)
Alkaline Phosphatase: 331 IU/L — ABNORMAL HIGH (ref 44–121)
BUN/Creatinine Ratio: 17 (ref 9–20)
BUN: 15 mg/dL (ref 6–24)
Bilirubin Total: 15.9 mg/dL (ref 0.0–1.2)
CO2: 19 mmol/L — ABNORMAL LOW (ref 20–29)
Calcium: 8.1 mg/dL — ABNORMAL LOW (ref 8.7–10.2)
Chloride: 97 mmol/L (ref 96–106)
Creatinine, Ser: 0.9 mg/dL (ref 0.76–1.27)
GFR calc Af Amer: 112 mL/min/{1.73_m2} (ref >59)
GFR calc non Af Amer: 96 mL/min/{1.73_m2} (ref >59)
Globulin, Total: 2.8 g/dL (ref 1.5–4.5)
Glucose: 100 mg/dL — ABNORMAL HIGH (ref 65–99)
Potassium: 4.5 mmol/L (ref 3.5–5.2)
Sodium: 131 mmol/L — ABNORMAL LOW (ref 134–144)
Total Protein: 5.7 g/dL — ABNORMAL LOW (ref 6.0–8.5)

## 2021-01-15 LAB — IRON,TIBC AND FERRITIN PANEL
Ferritin: 1068 ng/mL — ABNORMAL HIGH (ref 30–400)
Iron Saturation: 55 % (ref 15–55)
Iron: 97 ug/dL (ref 38–169)
Total Iron Binding Capacity: 175 ug/dL — ABNORMAL LOW (ref 250–450)
UIBC: 78 ug/dL — ABNORMAL LOW (ref 111–343)

## 2021-01-15 LAB — LIPID PANEL
Chol/HDL Ratio: 11 ratio — ABNORMAL HIGH (ref 0.0–5.0)
Cholesterol, Total: 132 mg/dL (ref 100–199)
HDL: 12 mg/dL — ABNORMAL LOW (ref >39)
LDL Chol Calc (NIH): 96 mg/dL (ref 0–99)
Triglycerides: 128 mg/dL (ref 0–149)
VLDL Cholesterol Cal: 24 mg/dL (ref 5–40)

## 2021-01-15 LAB — VITAMIN B12: Vitamin B-12: 1801 pg/mL — ABNORMAL HIGH (ref 232–1245)

## 2021-01-15 LAB — VITAMIN D 25 HYDROXY (VIT D DEFICIENCY, FRACTURES): Vit D, 25-Hydroxy: 9 ng/mL — ABNORMAL LOW (ref 30.0–100.0)

## 2021-01-15 LAB — PROTIME-INR
INR: 1.4 — ABNORMAL HIGH (ref 0.9–1.2)
Prothrombin Time: 14.6 s — ABNORMAL HIGH (ref 9.1–12.0)

## 2021-01-15 LAB — PLATELET COUNT: Platelets: 60 10*3/uL — CL (ref 150–450)

## 2021-01-15 LAB — TSH: TSH: 2.16 u[IU]/mL (ref 0.450–4.500)

## 2021-01-15 LAB — MAGNESIUM: Magnesium: 1.5 mg/dL — ABNORMAL LOW (ref 1.6–2.3)

## 2021-01-15 NOTE — Telephone Encounter (Addendum)
01/15/2021 - PATIENT SAW RICH MORROW IN THE OFFICE ON Wednesday 01/14/2021. HE HAD SOME PAPERWORK FOR RICH TO FILL OUT BUT DIDN'T BRING THE FORMS WITH HIM. RICH TOLD HIM TO DROP THEM OFF ON Thursday AND HE WOULD COMPLETE FOR HIM. PATIENT BROUGHT THEM BY AND I HAVE PLACED THEM AT THE NURSE'S STATION IN [place]. PLEASE CALL HIM FOR PICK-UP.  BEST PHONE [phone] (CELL)  MBC

## 2021-01-15 NOTE — Telephone Encounter (Signed)
Filled some sections, placed in providers box to be signed

## 2021-01-16 ENCOUNTER — Encounter: Payer: Self-pay | Admitting: Gastroenterology

## 2021-01-16 ENCOUNTER — Ambulatory Visit (INDEPENDENT_AMBULATORY_CARE_PROVIDER_SITE_OTHER): Payer: No Typology Code available for payment source | Admitting: Gastroenterology

## 2021-01-16 ENCOUNTER — Other Ambulatory Visit (INDEPENDENT_AMBULATORY_CARE_PROVIDER_SITE_OTHER): Payer: No Typology Code available for payment source

## 2021-01-16 ENCOUNTER — Other Ambulatory Visit: Payer: Self-pay

## 2021-01-16 ENCOUNTER — Telehealth: Payer: Self-pay

## 2021-01-16 VITALS — BP 148/70 | HR 89 | Ht 67.0 in | Wt 223.0 lb

## 2021-01-16 DIAGNOSIS — R1907 Generalized intra-abdominal and pelvic swelling, mass and lump: Secondary | ICD-10-CM

## 2021-01-16 DIAGNOSIS — D696 Thrombocytopenia, unspecified: Secondary | ICD-10-CM

## 2021-01-16 DIAGNOSIS — R7989 Other specified abnormal findings of blood chemistry: Secondary | ICD-10-CM

## 2021-01-16 DIAGNOSIS — K7031 Alcoholic cirrhosis of liver with ascites: Secondary | ICD-10-CM

## 2021-01-16 DIAGNOSIS — K7010 Alcoholic hepatitis without ascites: Secondary | ICD-10-CM | POA: Diagnosis not present

## 2021-01-16 LAB — COMPREHENSIVE METABOLIC PANEL
ALT: 40 U/L (ref 0–53)
AST: 116 U/L — ABNORMAL HIGH (ref 0–37)
Albumin: 2.7 g/dL — ABNORMAL LOW (ref 3.5–5.2)
Alkaline Phosphatase: 260 U/L — ABNORMAL HIGH (ref 39–117)
BUN: 17 mg/dL (ref 6–23)
CO2: 25 mEq/L (ref 19–32)
Calcium: 7.9 mg/dL — ABNORMAL LOW (ref 8.4–10.5)
Chloride: 99 mEq/L (ref 96–112)
Creatinine, Ser: 0.98 mg/dL (ref 0.40–1.50)
GFR: 87.55 mL/min (ref >60.00)
Glucose, Bld: 90 mg/dL (ref 70–99)
Potassium: 4.1 mEq/L (ref 3.5–5.1)
Sodium: 130 mEq/L — ABNORMAL LOW (ref 135–145)
Total Bilirubin: 18.1 mg/dL — ABNORMAL HIGH (ref 0.2–1.2)
Total Protein: 5.4 g/dL — ABNORMAL LOW (ref 6.0–8.3)

## 2021-01-16 LAB — HEPATIC FUNCTION PANEL
ALT: 40 U/L (ref 0–53)
AST: 116 U/L — ABNORMAL HIGH (ref 0–37)
Albumin: 2.7 g/dL — ABNORMAL LOW (ref 3.5–5.2)
Alkaline Phosphatase: 260 U/L — ABNORMAL HIGH (ref 39–117)
Bilirubin, Direct: 8.9 mg/dL — ABNORMAL HIGH (ref 0.0–0.3)
Total Bilirubin: 18.1 mg/dL — ABNORMAL HIGH (ref 0.2–1.2)
Total Protein: 5.4 g/dL — ABNORMAL LOW (ref 6.0–8.3)

## 2021-01-16 LAB — CBC
HCT: 34.5 % — ABNORMAL LOW (ref 39.0–52.0)
Hemoglobin: 11.8 g/dL — ABNORMAL LOW (ref 13.0–17.0)
MCHC: 34.3 g/dL (ref 30.0–36.0)
MCV: 98.2 fl (ref 78.0–100.0)
Platelets: 58 10*3/uL — ABNORMAL LOW (ref 150.0–400.0)
RBC: 3.52 Mil/uL — ABNORMAL LOW (ref 4.22–5.81)
RDW: 20.1 % — ABNORMAL HIGH (ref 11.5–15.5)
WBC: 7.4 10*3/uL (ref 4.0–10.5)

## 2021-01-16 LAB — BILIRUBIN, DIRECT: Bilirubin, Direct: 8.9 mg/dL — ABNORMAL HIGH (ref 0.0–0.3)

## 2021-01-16 LAB — URINE CULTURE

## 2021-01-16 NOTE — Telephone Encounter (Signed)
Pt called this morning and would like Richard to sign his papers today. Pt states his insurance company told him they were going to cancel his insurance if they do not get them back today.  Please call pt to pick up.

## 2021-01-16 NOTE — Progress Notes (Signed)
REFERRAL - [facility]  Referral, copy of ins card, demos, recent office notes, recent labs, recent imaging, recent paths and procedure reports have been faxed to [facility] (Total of 64 pages). Confirmation received. Records and confirmation will be held and will await response re: receipt of faxed records, as well as scheduling info.

## 2021-01-16 NOTE — Progress Notes (Signed)
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 Referral for liver transplant evaluation. No varices seen on recent EGD despite finding on cross-sectional imaging. No hepatoma or portal vein clot seen on imaging. Abdominal imaging every 6 months for [place] screening.  Ascites and peripheral edema: Continue 2000 mg sodium restricted diet, titrate diuretics as tolerated. Labs today to follow his renal function.   Suspected alcoholic hepatitis: Steroid stopped due to lack of response.  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 High risk for cholecystectomy at this time. Discussed potential symptoms of symptomatic gallbladder disease.    PLAN: CMP, CBC 2000 mg sodium restricted diet Aldactone 100 mg daily Lasix 40 mg daily Low threshold to add lactulose +/- Xifaxan Referral to [facility] for liver transplant evaluation Follow-up with me or Colleen in 1-2 weeks, earlier if needed He was counseled to continue to abstain from all alcohol  Please see the "Patient Instructions" section for addition details about the plan.  HPI: Ian Hall is a 55 y.o. male who returns in follow-up after his recent hospitalization. Discharged 01/09/21. Interval history  obtained through the patient and review of his electronic health record.   Steroids discontinued during his hospitalization given lack of improvement in liver enzymes after >7 days of treatment. He had a paracentesis, MRI, and liver biopsy while hospitalized. He was discharged 01/09/21.   Abdominal pain and girth are improving on diuretics. Continues to have some discomfort when walking. Some reversal in sleep wake cycle. He is having some confusion. No falls. No constipation or diarrhea. Having two bowel movements daily.   No alcohol since Christmas. He is careful with the sodium in his diet.  He denies the use of any medications prior to his initial consultation with me 12/16/20.  He has been unable to work due to ongoing symptoms.   Events since his last office visit with me: - CT abd/pelvis with contrast 12/24/20: cirrhosis with perihepatic ascites and splenomegaly, patent portal vein, calcified gallbladder wall versus large stone in the GB (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), diverticulosis, colonic wall thickening, anasarca - Started steroids for possible alcoholic hepatitis - Abdominal ultrasound 12/26/20: calcified gallbladder wall versus large gallstone - Not enough ascites for paracentesis 12/27/20 - Stool + for Enteroaggregative E coli, treated with Cipro - EGD 12/30/20: food in the stomach, no varices or portal hypertensive gastropathy - Colonoscopy 12/30/20: patchy colitis, diverticulosis - MRI 2/9/222: cirrhosis, splenomegaly, small volume ascites, small gastroesophageal and paraumbilical varices, mild porta hepatis adenopathy, cholelithiasis, colonic diverticulosis - Liver biopsy 01/09/21: Cirrhosis of uncertain etiology, marked cholestasis, no iron overload - suspected burnt out NASH or ASH, possible overlapping drug injury - Labs 01/14/21: Na 131, crt 0.90,  TB 15.9, AST 139, ALT 47, alk phos 331, iron 97, ferritin  1,060, hgb 12.3, platelets 60, MCV 88, RDW 17.5, INR 1.4, TSH  2.160 for a MELD of 25     Past Medical History:  Diagnosis Date  . Anxiety   . COVID-19     Past Surgical History:  Procedure Laterality Date  . ANKLE SURGERY Right   . IR PARACENTESIS  01/07/2021  . IR TRANSCATHETER BX  01/09/2021  . IR US GUIDE VASC ACCESS RIGHT  01/09/2021  . IR VENOGRAM HEPATIC W HEMODYNAMIC EVALUATION  01/09/2021    Current Outpatient Medications  Medication Sig Dispense Refill  . dicyclomine (BENTYL) 10 MG capsule Take 1 capsule (10 mg total) by mouth 4 (four) times daily -  before meals and at bedtime. 300 capsule 2  . folic acid (FOLVITE) 1 MG tablet Take 1 tablet (1 mg total) by mouth daily. 30 tablet 1  . furosemide (LASIX) 40 MG tablet Take 1 tablet (40 mg total) by mouth daily. 90 tablet 0  . psyllium (METAMUCIL SMOOTH TEXTURE) 28 % packet Take 1 packet by mouth 2 (two) times daily.    [name] spironolactone (ALDACTONE) 100 MG tablet Take 1 tablet (100 mg total) by mouth daily. 90 tablet 0  . thiamine 100 MG tablet Take 1 tablet (100 mg total) by mouth daily. 30 tablet 1   No current facility-administered medications for this visit.    Allergies as of 01/16/2021 - Review Complete 01/16/2021  Allergen Reaction Noted  . Hydrocodone Itching 08/26/2015      Physical Exam: General:   Alert, in NAD. Scleral icterus - although this is improving.  Heart:  Regular rate and rhythm; no murmurs Pulm: Clear anteriorly; no wheezing Abdomen:  Protuberent but soft. Nontender. Nondistended. Normal bowel sounds. No rebound or guarding. No fluid wave.  No hepatosplenomegaly. LAD: No inguinal or umbilical LAD Extremities:  1+ LE edema Neurologic:  Alert and  oriented x4;  grossly normal neurologically; no asterixis or clonus. Skin: No jaundice. Mild palmar erythema. Few spider angioma on the chest wall.  No Terry's nails. Psych:  Alert and cooperative. Normal mood and affect.     Alezandra Egli L. Tarri Glenn, MD, MPH 01/16/2021, 5:39 PM

## 2021-01-16 NOTE — Patient Instructions (Addendum)
RECOMMENDATION(S):   LABS:   . Your provider has requested that you go to the basement level for lab work before leaving today. Press "B" on the elevator. The lab is located at the first door on the left as you exit the elevator.  HEALTHCARE LAWS AND MY CHART RESULTS:   . Due to recent changes in healthcare laws, you may see the results of your imaging and laboratory studies on MyChart before your provider has had a chance to review them.  We understand that in some cases there may be results that are confusing or concerning to you. Not all laboratory results come back in the same time frame and the provider may be waiting for multiple results in order to interpret others.  Please give [place] 48 hours in order for your provider to thoroughly review all the results before contacting the office for clarification of your results.   REFERRAL:   I am referring you to the [facility]. You will receive a call from their office regarding your appointment date, time and location.  FMLA:   We are returning your FMLA paperwork to you for you to give to Dr. Orland Mustard for completion.  MEDICATIONS:   I did not make any changes to your medication regimen today. Please call the office when you are needing refills on your medications  FOLLOW UP:   Please follow up with Carl Best, NP as scheduled  BMI:  If you are age 82 or younger, your body mass index should be between 19-25. Your There is no height or weight on file to calculate BMI. If this is out of the aformentioned range listed, please consider follow up with your Primary Care Provider.   Thank you for trusting me with your gastrointestinal care!    Thornton Park, MD, MPH

## 2021-01-16 NOTE — Telephone Encounter (Signed)
Pt came by office and picked up his paperwork. I faxed off our copy and got a confirmation.

## 2021-01-16 NOTE — Telephone Encounter (Signed)
Attempted to call the patient to let him know that the fax machine was having some problems. I also left a detailed voicemail.

## 2021-01-16 NOTE — Telephone Encounter (Signed)
Do have these papers? If you have completed them then I can fax them.  Please let me know

## 2021-01-20 NOTE — Progress Notes (Signed)
Received incoming fax from [facility] indicating pt has been scheduled for appt on 02/11/21 @ 2:15pm.

## 2021-01-21 ENCOUNTER — Telehealth: Payer: Self-pay | Admitting: Hematology and Oncology

## 2021-01-21 NOTE — Telephone Encounter (Signed)
Scheduled per 02/23 scheduled message, patient has been called and notified of 03/02 appointment.

## 2021-01-27 ENCOUNTER — Other Ambulatory Visit: Payer: Self-pay | Admitting: Hematology and Oncology

## 2021-01-27 DIAGNOSIS — D696 Thrombocytopenia, unspecified: Secondary | ICD-10-CM

## 2021-01-27 NOTE — Progress Notes (Signed)
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 Lorenso Courier   Interval History:  Ian Hall 55 y.o. 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 The patient's last visit was on 10/29/2020 at which time he established care. 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  On exam today Ian Hall reports that he has an appointment [facility] approximately [facility] weeks time.  Unfortunately has been having some painful swelling in his abdomen and some loose stools.  He reports that he has about 3-4 loose stools per day he does occasionally see a little bit of blood in the stool.  He reports that his eyes are jaundiced but they do occasionally clear.  He has lost weight and is down to about 220 pounds.  Otherwise he denies any fevers, chills, sweats, nausea, vomiting or diarrhea.  He has had no major sources of bleeding, bruising, or dark stools.  A full 10 point ROS is listed below.  MEDICAL HISTORY:  Past Medical History:  Diagnosis Date  . Anxiety   . COVID-19     SURGICAL HISTORY: Past Surgical History:  Procedure Laterality Date  . ANKLE SURGERY Right   . IR PARACENTESIS  01/07/2021  . IR TRANSCATHETER BX  01/09/2021  . IR US GUIDE VASC ACCESS RIGHT  01/09/2021  . IR VENOGRAM HEPATIC W HEMODYNAMIC EVALUATION  01/09/2021    SOCIAL HISTORY: Social History   Socioeconomic History  . Marital status: Married    Spouse name: Not on file  . Number  of children: 0  . Years of education: Not on file  . Highest education level: Not on file  Occupational History  . Occupation: IT sales professional: [employer]  Tobacco Use  . Smoking status: Former Smoker    Types: Cigarettes    Quit date: 12/16/2002    Years since quitting: 18.1  . Smokeless tobacco: Never Used  Vaping Use  . Vaping Use: Never used  Substance and Sexual Activity  . Alcohol use: Not Currently    Comment: none for 3-4 years (stated 12/16/2020)  . Drug use: No  . Sexual activity: Yes    Birth control/protection: None  Other Topics Concern  . Not on file  Social History Narrative  . Not on file   Social Determinants of Health   Financial Resource Strain: Not on file  Food Insecurity: Not on file  Transportation Needs: Not on file  Physical Activity: Not on file  Stress: Not on file  Social Connections: Not on file  Intimate Partner Violence: Not on file    FAMILY HISTORY: Family History  Problem Relation Age of Onset  . Diabetes Mother   . Memory loss Mother   . Diabetes Father   . Diabetes Cousin     ALLERGIES:  is allergic to hydrocodone.  MEDICATIONS:  Current Outpatient Medications  Medication Sig Dispense Refill  . dicyclomine (BENTYL) 10 MG capsule Take 1 capsule (10 mg total) by mouth 4 (four)  times daily -  before meals and at bedtime. 299 capsule 2  . folic acid (FOLVITE) 1 MG tablet Take 1 tablet (1 mg total) by mouth daily. 30 tablet 1  . furosemide (LASIX) 40 MG tablet Take 1 tablet (40 mg total) by mouth daily. 90 tablet 0  . psyllium (METAMUCIL SMOOTH TEXTURE) 28 % packet Take 1 packet by mouth 2 (two) times daily.    [name] spironolactone (ALDACTONE) 100 MG tablet Take 1 tablet (100 mg total) by mouth daily. 90 tablet 0  . thiamine 100 MG tablet Take 1 tablet (100 mg total) by mouth daily. (Patient not taking: Reported on 01/28/2021) 30 tablet 1   No current facility-administered medications for this visit.    REVIEW OF  SYSTEMS:   Constitutional: ( - ) fevers, ( - )  chills , ( - ) night sweats Eyes: ( - ) blurriness of vision, ( - ) double vision, ( - ) watery eyes Ears, nose, mouth, throat, and face: ( - ) mucositis, ( - ) sore throat Respiratory: ( - ) cough, ( - ) dyspnea, ( - ) wheezes Cardiovascular: ( - ) palpitation, ( - ) chest discomfort, ( - ) lower extremity swelling Gastrointestinal:  ( - ) nausea, ( - ) heartburn, ( - ) change in bowel habits Skin: ( - ) abnormal skin rashes Lymphatics: ( - ) new lymphadenopathy, ( - ) easy bruising Neurological: ( - ) numbness, ( - ) tingling, ( - ) new weaknesses Behavioral/Psych: ( - ) mood change, ( - ) new changes  All other systems were reviewed with the patient and are negative.  PHYSICAL EXAMINATION:  Vitals:   01/28/21 1121  BP: 121/63  Pulse: 90  Resp: 20  Temp: 97.8 F (36.6 C)  SpO2: 99%   Filed Weights   01/28/21 1121  Weight: 220 lb (99.8 kg)    GENERAL: chronically ill appearing middle aged male. alert, no distress and comfortable SKIN: skin color, texture, turgor are normal, no rashes or significant lesions EYES: conjunctiva are pink and non-injected, sclera jaundiced LUNGS: clear to auscultation and percussion with normal breathing effort HEART: regular rate & rhythm and no murmurs and no lower extremity edema ABDOMEN: soft, non-tender, distended with shifting dullness and fluid wave, normal bowel sounds Musculoskeletal: no cyanosis of digits and no clubbing  PSYCH: alert & oriented x 3, fluent speech NEURO: no focal motor/sensory deficits  LABORATORY DATA:  I have reviewed the data as listed CBC Latest Ref Rng & Units 01/28/2021 01/16/2021 01/14/2021  WBC 4.0 - 10.5 K/uL 8.9 7.4 8.8  Hemoglobin 13.0 - 17.0 g/dL 12.3(L) 11.8(L) 12.3(L)  Hematocrit 39.0 - 52.0 % 34.3(L) 34.5(L) 33.5(L)  Platelets 150 - 400 K/uL 92(L) 58.0(L) 60(LL)    CMP Latest Ref Rng & Units 01/16/2021 01/16/2021 01/14/2021  Glucose 70 - 99 mg/dL - 90 100(H)   BUN 6 - 23 mg/dL - 17 15  Creatinine 0.40 - 1.50 mg/dL - 0.98 0.90  Sodium 135 - 145 mEq/L - 130(L) 131(L)  Potassium 3.5 - 5.1 mEq/L - 4.1 4.5  Chloride 96 - 112 mEq/L - 99 97  CO2 19 - 32 mEq/L - 25 19(L)  Calcium 8.4 - 10.5 mg/dL - 7.9(L) 8.1(L)  Total Protein 6.0 - 8.3 g/dL 5.4(L) 5.4(L) 5.7(L)  Total Bilirubin 0.2 - 1.2 mg/dL 18.1(H) 18.1(H) 15.9(HH)  Alkaline Phos 39 - 117 U/L 260(H) 260(H) 331(H)  AST 0 - 37 U/L 116(H) 116(H) 139(H)  ALT 0 - 53  U/L 40 40 47(H)    RADIOGRAPHIC STUDIES: IR Venogram Hepatic W Hemodynamic Evaluation  Result Date: 01/09/2021 CLINICAL DATA:  Elevated liver function tests. Transjugular liver biopsy requested given thrombocytopenia and ascites. EXAM: Transjugular hepatic venogram, pressure measurements, and biopsy MEDICATIONS: MEDICATIONS None. ANESTHESIA/SEDATION: None COMPLICATIONS: None immediate. PROCEDURE: Informed written consent was obtained from the patient after a thorough discussion of the procedural risks, benefits and alternatives. All questions were addressed. Maximal Sterile Barrier Technique was utilized including caps, mask, sterile gowns, sterile gloves, sterile drape, hand hygiene and skin antiseptic. A timeout was performed prior to the initiation of the procedure. Patient positioned supine on the procedure table. Right neck skin was prepped and draped in usual sterile fashion. Sterile ultrasound probe cover and sterile gel utilized throughout the procedure. Ultrasound image documenting patency of the right internal jugular vein was obtained and placed in per medical record. Subcutaneous lidocaine administered for local anesthetic. Right internal jugular vein was accessed with a 21 gauge needle under continuous ultrasound guidance. 21 gauge needle over 0.018 inch guidewire and access upsized to a transitional dilator set. Transitional dilator set removed over 0.035 inch guidewire and 10 French 25 cm sheath inserted to the level of the inferior  vena cava under fluoroscopic guidance. The middle hepatic vein was selected with Sos Omni catheter and glidewire. Venogram was performed to confirm appropriate positioning within the middle hepatic vein. Sos omni catheter was removed over Amplatz guidewire and the 10 French sheath was advanced into the middle hepatic vein using the introducer. Four 18 gauge cores were obtained from the right hepatic lobe using fluoroscopic guidance. All samples were sent to pathology in formalin. Pull-back pressure measurements were performed: Wedged portal vein: 30 mm Hg Hepatic vein: 15 mm Hg IVC: 13 mm Hg Right atrium: 30 mm Hg Portosystemic gradient: 17 mm Hg Right IJ sheath was removed and hemostasis achieved with manual compression. IMPRESSION: 1. Transjugular liver biopsy as above. 2. Portosystemic gradient of 17 mm Hg consistent with portal hypertension. Electronically Signed   By: Miachel Roux M.D.   On: 01/09/2021 18:00   IR Transcatheter BX  Result Date: 01/09/2021 CLINICAL DATA:  Elevated liver function tests. Transjugular liver biopsy requested given thrombocytopenia and ascites. EXAM: Transjugular hepatic venogram, pressure measurements, and biopsy MEDICATIONS: MEDICATIONS None. ANESTHESIA/SEDATION: None COMPLICATIONS: None immediate. PROCEDURE: Informed written consent was obtained from the patient after a thorough discussion of the procedural risks, benefits and alternatives. All questions were addressed. Maximal Sterile Barrier Technique was utilized including caps, mask, sterile gowns, sterile gloves, sterile drape, hand hygiene and skin antiseptic. A timeout was performed prior to the initiation of the procedure. Patient positioned supine on the procedure table. Right neck skin was prepped and draped in usual sterile fashion. Sterile ultrasound probe cover and sterile gel utilized throughout the procedure. Ultrasound image documenting patency of the right internal jugular vein was obtained and placed in per  medical record. Subcutaneous lidocaine administered for local anesthetic. Right internal jugular vein was accessed with a 21 gauge needle under continuous ultrasound guidance. 21 gauge needle over 0.018 inch guidewire and access upsized to a transitional dilator set. Transitional dilator set removed over 0.035 inch guidewire and 10 French 25 cm sheath inserted to the level of the inferior vena cava under fluoroscopic guidance. The middle hepatic vein was selected with Sos Omni catheter and glidewire. Venogram was performed to confirm appropriate positioning within the middle hepatic vein. Sos omni catheter was removed over Amplatz guidewire and the 10 French sheath was  advanced into the middle hepatic vein using the introducer. Four 18 gauge cores were obtained from the right hepatic lobe using fluoroscopic guidance. All samples were sent to pathology in formalin. Pull-back pressure measurements were performed: Wedged portal vein: 30 mm Hg Hepatic vein: 15 mm Hg IVC: 13 mm Hg Right atrium: 30 mm Hg Portosystemic gradient: 17 mm Hg Right IJ sheath was removed and hemostasis achieved with manual compression. IMPRESSION: 1. Transjugular liver biopsy as above. 2. Portosystemic gradient of 17 mm Hg consistent with portal hypertension. Electronically Signed   By: Miachel Roux M.D.   On: 01/09/2021 18:00   IR US Guide Vasc Access Right  Result Date: 01/13/2021 CLINICAL DATA:  Elevated liver function tests. Transjugular liver biopsy requested given thrombocytopenia and ascites.  EXAM: Transjugular hepatic venogram, pressure measurements, and biopsy  MEDICATIONS: MEDICATIONS None.  ANESTHESIA/SEDATION: None  COMPLICATIONS: None immediate.  PROCEDURE: Informed written consent was obtained from the patient after a thorough discussion of the procedural risks, benefits and alternatives. All questions were addressed. Maximal Sterile Barrier Technique was utilized including caps, mask, sterile gowns, sterile gloves,  sterile drape, hand hygiene and skin antiseptic. A timeout was performed prior to the initiation of the procedure.  Patient positioned supine on the procedure table. Right neck skin was prepped and draped in usual sterile fashion. Sterile ultrasound probe cover and sterile gel utilized throughout the procedure.  Ultrasound image documenting patency of the right internal jugular vein was obtained and placed in per medical record. Subcutaneous lidocaine administered for local anesthetic. Right internal jugular vein was accessed with a 21 gauge needle under continuous ultrasound guidance. 21 gauge needle over 0.018 inch guidewire and access upsized to a transitional dilator set.  Transitional dilator set removed over 0.035 inch guidewire and 10 French 25 cm sheath inserted to the level of the inferior vena cava under fluoroscopic guidance. The middle hepatic vein was selected with Sos Omni catheter and glidewire.  Venogram was performed to confirm appropriate positioning within the middle hepatic vein. Sos omni catheter was removed over Amplatz guidewire and the 10 French sheath was advanced into the middle hepatic vein using the introducer.  Four 18 gauge cores were obtained from the right hepatic lobe using fluoroscopic guidance. All samples were sent to pathology in formalin.  Pull-back pressure measurements were performed:  Wedged portal vein: 30 mm Hg  Hepatic vein: 15 mm Hg  IVC: 13 mm Hg  Right atrium: 30 mm Hg  Portosystemic gradient: 17 mm Hg  Right IJ sheath was removed and hemostasis achieved with manual compression.  IMPRESSION: 1. Transjugular liver biopsy as above. 2. Portosystemic gradient of 17 mm Hg consistent with portal hypertension.   Electronically Signed   By: Miachel Roux M.D.   On: 01/09/2021 18:00   MR ABDOMEN MRCP W WO CONTAST  Result Date: 01/07/2021 CLINICAL DATA:  Inpatient. Jaundice. Cirrhosis with ascites, status post paracentesis. EXAM: MRI ABDOMEN WITHOUT AND WITH  CONTRAST (INCLUDING MRCP) TECHNIQUE: Multiplanar multisequence MR imaging of the abdomen was performed both before and after the administration of intravenous contrast. Heavily T2-weighted images of the biliary and pancreatic ducts were obtained, and three-dimensional MRCP images were rendered by post processing. CONTRAST:  51m GADAVIST GADOBUTROL 1 MMOL/ML IV SOLN COMPARISON:  12/24/2020 CT abdomen/pelvis. FINDINGS: Lower chest: No acute abnormality at the lung bases. Hepatobiliary: Diffusely irregular liver surface and relatively atrophic liver compatible with cirrhosis. No hepatic steatosis. No liver mass. There is a 3.6 cm gallstone within the contracted gallbladder. Borderline mild  diffuse gallbladder wall thickening. No biliary ductal dilatation. Common bile duct diameter 2 mm. No evidence of choledocholithiasis. No biliary masses, strictures or beading. Pancreas: No pancreatic mass or duct dilation.  No pancreas divisum. Spleen: Mild splenomegaly. Craniocaudal splenic length 14.1 cm. No splenic masses. Adrenals/Urinary Tract: Normal adrenals. No hydronephrosis. Several scattered subcentimeter simple renal cysts in both kidneys. No suspicious renal masses. Stomach/Bowel: Normal non-distended stomach. No dilated small or large bowel loops. Diffuse colonic diverticulosis. Generalized mild wall thickening in the small and large bowel, similar to the recent CT. Vascular/Lymphatic: Normal caliber abdominal aorta. Patent portal, splenic, hepatic and renal veins. Small gastroesophageal and paraumbilical varices. Mild porta hepatis adenopathy up to 1.2 cm (series 26/image 54). Other: Small volume ascites, most prominent in the perihepatic space. No focal fluid collections. Musculoskeletal: No aggressive appearing focal osseous lesions. Small right L2 vertebral hemangioma. IMPRESSION: 1. Cirrhosis. No liver mass. 2. Mild splenomegaly. Small volume ascites. Small gastroesophageal and paraumbilical varices. 3. Mild porta  hepatis adenopathy, nonspecific, more likely reactive. 4. Cholelithiasis with contracted gallbladder. Borderline mild diffuse gallbladder wall thickening, probably due to noninflammatory edema. 5. No biliary ductal dilatation. No evidence of choledocholithiasis. 6. Diffuse colonic diverticulosis. Generalized mild wall thickening in the small and large bowel, similar to the recent CT, favor noninflammatory edema. Electronically Signed   By: Ilona Sorrel M.D.   On: 01/07/2021 20:58   [place] ASCITES (ABDOMEN LIMITED)  Result Date: 01/07/2021 CLINICAL DATA:  Ascites EXAM: LIMITED ABDOMEN ULTRASOUND FOR ASCITES TECHNIQUE: Limited ultrasound survey for ascites was performed in all four abdominal quadrants. COMPARISON:  03/26/2021 FINDINGS: There is a small to moderate volume of abdominal ascites, primarily in the upper abdomen. The liver is cirrhotic. IMPRESSION: Small to moderate volume of ascites, primarily in the upper abdomen. Electronically Signed   By: Constance Holster M.D.   On: 01/07/2021 01:51   US Abdomen Limited RUQ (LIVER/GB)  Result Date: 01/07/2021 CLINICAL DATA:  Abdominal pain EXAM: ULTRASOUND ABDOMEN LIMITED RIGHT UPPER QUADRANT COMPARISON:  December 27, 2020 FINDINGS: Gallbladder: There is a large gallstone within the gallbladder that limits evaluation. There appears to be some gallbladder wall thickening with the gallbladder wall measuring 4 mm in thickness. The sonographic Percell Miller sign is negative. Common bile duct: Diameter: 5 mm Liver: The liver surface appears nodular suggestive of underlying cirrhosis. Portal vein is patent on color Doppler imaging with normal direction of blood flow towards the liver. Other: Ascites is noted. IMPRESSION: 1. Cholelithiasis with nonspecific gallbladder wall thickening. This can be seen in the presence of ascites or acute cholecystitis. Consider further evaluation with HIDA scan as clinically indicated. 2. Cirrhotic appearing liver with moderate volume ascites.  Electronically Signed   By: Constance Holster M.D.   On: 01/07/2021 01:50   IR Paracentesis  Result Date: 01/07/2021 INDICATION: Patient with history of alcoholic cirrhosis, ascites. Request is made for diagnostic and therapeutic paracentesis. EXAM: ULTRASOUND GUIDED DIAGNOSTIC AND THERAPEUTIC PARACENTESIS MEDICATIONS: 10 mL 1% lidocaine COMPLICATIONS: None immediate. PROCEDURE: Informed written consent was obtained from the patient after a discussion of the risks, benefits and alternatives to treatment. A timeout was performed prior to the initiation of the procedure. Initial ultrasound scanning demonstrates a moderate amount of ascites within the right lower abdominal quadrant. The right lower abdomen was prepped and draped in the usual sterile fashion. 1% lidocaine was used for local anesthesia. Following this, a 19 gauge, 7-cm, Yueh catheter was introduced. An ultrasound image was saved for documentation purposes. The paracentesis was performed. The catheter  was removed and a dressing was applied. The patient tolerated the procedure well without immediate post procedural complication. FINDINGS: A total of approximately 4.0 liters of bright yellow fluid was removed. IMPRESSION: Successful ultrasound-guided diagnostic and therapeutic paracentesis yielding 4.0 liters of peritoneal fluid. Read by: Brynda Greathouse PA-C Electronically Signed   By: Miachel Roux M.D.   On: 01/07/2021 13:45    ASSESSMENT & PLAN Ian Hall 55 y.o. 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   After review the labs, the records, and discussion with the patient the findings most consistent with thrombocytopenia secondary to cirrhosis.  Fortunately this time the patient has no evidence of bleeding, bruising, or dark stools.  His platelet count is currently elevated above the transfusion threshold and above the level we would require TPO therapy.  At this time I  do not believe there is any more our service can offer and therefore I recommend he continue to follow with hepatology.  In the event that our services were required we would be happy to see him back in our clinic.  Otherwise his care is best managed now with hepatology.  # Thrombocytopenia 2/2 to Cirrhosis  --Findings are most consistent with thrombocytopenia and elevated alkaline phosphatase due to liver disease. He has established with [facility] and is in process for referral to [facility] for consideration of a liver transplant.  --Hepatitis work-up negative. Hep B and C serologies -- peripheral blood film shows no evidence pseudothrombocytopenia --Return to clinic as needed. Defer to the care of hepatology  No orders of the defined types were placed in this encounter.   All questions were answered. The patient knows to call the clinic with any problems, questions or concerns.  A total of more than 30 minutes were spent on this encounter and over half of that time was spent on counseling and coordination of care as outlined above.   Ledell Peoples, MD Department of Hematology/Oncology [place] [facility] Phone: [phone] Pager: [phone] Email: [email or web address]  01/28/2021 1:00 PM

## 2021-01-28 ENCOUNTER — Inpatient Hospital Stay: Payer: No Typology Code available for payment source | Admitting: Hematology and Oncology

## 2021-01-28 ENCOUNTER — Inpatient Hospital Stay: Payer: No Typology Code available for payment source | Attending: Hematology and Oncology

## 2021-01-28 ENCOUNTER — Encounter: Payer: Self-pay | Admitting: Hematology and Oncology

## 2021-01-28 ENCOUNTER — Other Ambulatory Visit: Payer: Self-pay

## 2021-01-28 VITALS — BP 121/63 | HR 90 | Temp 97.8°F | Resp 20 | Ht 65.0 in | Wt 220.0 lb

## 2021-01-28 DIAGNOSIS — R634 Abnormal weight loss: Secondary | ICD-10-CM | POA: Diagnosis not present

## 2021-01-28 DIAGNOSIS — K7460 Unspecified cirrhosis of liver: Secondary | ICD-10-CM | POA: Insufficient documentation

## 2021-01-28 DIAGNOSIS — K5730 Diverticulosis of large intestine without perforation or abscess without bleeding: Secondary | ICD-10-CM | POA: Insufficient documentation

## 2021-01-28 DIAGNOSIS — D696 Thrombocytopenia, unspecified: Secondary | ICD-10-CM | POA: Diagnosis not present

## 2021-01-28 DIAGNOSIS — D6959 Other secondary thrombocytopenia: Secondary | ICD-10-CM | POA: Insufficient documentation

## 2021-01-28 DIAGNOSIS — Z87891 Personal history of nicotine dependence: Secondary | ICD-10-CM | POA: Diagnosis not present

## 2021-01-28 DIAGNOSIS — Z79899 Other long term (current) drug therapy: Secondary | ICD-10-CM | POA: Insufficient documentation

## 2021-01-28 DIAGNOSIS — R7989 Other specified abnormal findings of blood chemistry: Secondary | ICD-10-CM | POA: Insufficient documentation

## 2021-01-28 DIAGNOSIS — R188 Other ascites: Secondary | ICD-10-CM | POA: Diagnosis not present

## 2021-01-28 DIAGNOSIS — K921 Melena: Secondary | ICD-10-CM | POA: Diagnosis not present

## 2021-01-28 DIAGNOSIS — Z8616 Personal history of COVID-19: Secondary | ICD-10-CM | POA: Diagnosis not present

## 2021-01-28 LAB — CBC WITH DIFFERENTIAL (CANCER CENTER ONLY)
Abs Immature Granulocytes: 0.14 10*3/uL — ABNORMAL HIGH (ref 0.00–0.07)
Basophils Absolute: 0.1 10*3/uL (ref 0.0–0.1)
Basophils Relative: 1 %
Eosinophils Absolute: 0.1 10*3/uL (ref 0.0–0.5)
Eosinophils Relative: 1 %
HCT: 34.3 % — ABNORMAL LOW (ref 39.0–52.0)
Hemoglobin: 12.3 g/dL — ABNORMAL LOW (ref 13.0–17.0)
Immature Granulocytes: 2 %
Lymphocytes Relative: 18 %
Lymphs Abs: 1.6 10*3/uL (ref 0.7–4.0)
MCH: 33.9 pg (ref 26.0–34.0)
MCHC: 35.9 g/dL (ref 30.0–36.0)
MCV: 94.5 fL (ref 80.0–100.0)
Monocytes Absolute: 1 10*3/uL (ref 0.1–1.0)
Monocytes Relative: 11 %
Neutro Abs: 6 10*3/uL (ref 1.7–7.7)
Neutrophils Relative %: 67 %
Platelet Count: 92 10*3/uL — ABNORMAL LOW (ref 150–400)
RBC: 3.63 MIL/uL — ABNORMAL LOW (ref 4.22–5.81)
RDW: 16.9 % — ABNORMAL HIGH (ref 11.5–15.5)
WBC Count: 8.9 10*3/uL (ref 4.0–10.5)
nRBC: 0 % (ref 0.0–0.2)

## 2021-01-28 LAB — SAVE SMEAR(SSMR), FOR PROVIDER SLIDE REVIEW

## 2021-01-29 ENCOUNTER — Ambulatory Visit: Payer: No Typology Code available for payment source | Admitting: Physician Assistant

## 2021-01-29 ENCOUNTER — Encounter: Payer: Self-pay | Admitting: Physician Assistant

## 2021-01-29 ENCOUNTER — Other Ambulatory Visit (INDEPENDENT_AMBULATORY_CARE_PROVIDER_SITE_OTHER): Payer: No Typology Code available for payment source

## 2021-01-29 VITALS — BP 118/70 | HR 86 | Ht 67.0 in | Wt 220.4 lb

## 2021-01-29 DIAGNOSIS — K7682 Hepatic encephalopathy: Secondary | ICD-10-CM

## 2021-01-29 DIAGNOSIS — K7031 Alcoholic cirrhosis of liver with ascites: Secondary | ICD-10-CM | POA: Diagnosis not present

## 2021-01-29 DIAGNOSIS — R5383 Other fatigue: Secondary | ICD-10-CM

## 2021-01-29 DIAGNOSIS — K7290 Hepatic failure, unspecified without coma: Secondary | ICD-10-CM

## 2021-01-29 DIAGNOSIS — K7460 Unspecified cirrhosis of liver: Secondary | ICD-10-CM

## 2021-01-29 LAB — CBC WITH DIFFERENTIAL/PLATELET
Basophils Absolute: 0.1 10*3/uL (ref 0.0–0.1)
Basophils Relative: 0.9 % (ref 0.0–3.0)
Eosinophils Absolute: 0.1 10*3/uL (ref 0.0–0.7)
Eosinophils Relative: 1.1 % (ref 0.0–5.0)
HCT: 38.1 % — ABNORMAL LOW (ref 39.0–52.0)
Hemoglobin: 13.4 g/dL (ref 13.0–17.0)
Lymphocytes Relative: 18.4 % (ref 12.0–46.0)
Lymphs Abs: 1.2 10*3/uL (ref 0.7–4.0)
MCHC: 35.3 g/dL (ref 30.0–36.0)
MCV: 98.8 fl (ref 78.0–100.0)
Monocytes Absolute: 0.8 10*3/uL (ref 0.1–1.0)
Monocytes Relative: 12.3 % — ABNORMAL HIGH (ref 3.0–12.0)
Neutro Abs: 4.5 10*3/uL (ref 1.4–7.7)
Neutrophils Relative %: 67.3 % (ref 43.0–77.0)
Platelets: 80 10*3/uL — ABNORMAL LOW (ref 150.0–400.0)
RBC: 3.86 Mil/uL — ABNORMAL LOW (ref 4.22–5.81)
RDW: 17.8 % — ABNORMAL HIGH (ref 11.5–15.5)
WBC: 6.7 10*3/uL (ref 4.0–10.5)

## 2021-01-29 LAB — COMPREHENSIVE METABOLIC PANEL
ALT: 49 U/L (ref 0–53)
AST: 154 U/L — ABNORMAL HIGH (ref 0–37)
Albumin: 2.7 g/dL — ABNORMAL LOW (ref 3.5–5.2)
Alkaline Phosphatase: 323 U/L — ABNORMAL HIGH (ref 39–117)
BUN: 18 mg/dL (ref 6–23)
CO2: 23 mEq/L (ref 19–32)
Calcium: 8.1 mg/dL — ABNORMAL LOW (ref 8.4–10.5)
Chloride: 96 mEq/L (ref 96–112)
Creatinine, Ser: 0.91 mg/dL (ref 0.40–1.50)
GFR: 95.67 mL/min (ref >60.00)
Glucose, Bld: 114 mg/dL — ABNORMAL HIGH (ref 70–99)
Potassium: 3.9 mEq/L (ref 3.5–5.1)
Sodium: 124 mEq/L — ABNORMAL LOW (ref 135–145)
Total Bilirubin: 16.5 mg/dL — ABNORMAL HIGH (ref 0.2–1.2)
Total Protein: 5.8 g/dL — ABNORMAL LOW (ref 6.0–8.3)

## 2021-01-29 LAB — AMMONIA: Ammonia: 74 umol/L — ABNORMAL HIGH (ref 11–35)

## 2021-01-29 LAB — PROTIME-INR
INR: 1.6 ratio — ABNORMAL HIGH (ref 0.8–1.0)
Prothrombin Time: 18.1 s — ABNORMAL HIGH (ref 9.6–13.1)

## 2021-01-29 MED ORDER — SPIRONOLACTONE 100 MG PO TABS: 100.0000 mg | ORAL_TABLET | Freq: Every day | ORAL | 0 refills | Status: DC

## 2021-01-29 MED ORDER — FUROSEMIDE 40 MG PO TABS: 40.0000 mg | ORAL_TABLET | Freq: Every day | ORAL | 3 refills | Status: DC

## 2021-01-29 MED ORDER — ALBUMIN HUMAN 5 % IV SOLN: 75.0000 g | Freq: Once | INTRAVENOUS | Status: DC

## 2021-01-29 MED ORDER — LACTULOSE 10 G PO PACK: 30.0000 g | PACK | Freq: Three times a day (TID) | ORAL | 4 refills | Status: DC

## 2021-01-29 MED ORDER — RIFAXIMIN 550 MG PO TABS: 550.0000 mg | ORAL_TABLET | Freq: Two times a day (BID) | ORAL | 11 refills | Status: DC

## 2021-01-29 NOTE — Patient Instructions (Addendum)
If you are age 55 or older, your body mass index should be between 23-30. Your Body mass index is 34.52 kg/m. If this is out of the aforementioned range listed, please consider follow up with your Primary Care Provider.  If you are age 65 or younger, your body mass index should be between 19-25. Your Body mass index is 34.52 kg/m. If this is out of the aformentioned range listed, please consider follow up with your Primary Care Provider.   Your provider has requested that you go to the basement level for lab work before leaving today. Press "B" on the elevator. The lab is located at the first door on the left as you exit the elevator.  You have been scheduled for an abdominal paracentesis at [facility] radiology (1st floor of hospital) on 01/30/2021 at 1:00 pm. Please arrive at least 15 minutes prior to your appointment time for registration. Should you need to reschedule this appointment for any reason, please call our office at [phone].  Continue Furosemide 40 mg 1 tablet every morning. Continue Spironolactone 100 mg 1 tablet every morning.  START Lactulose grams every morning  We have sent your demographic information and a prescription for Xifaxan to [facility]. This pharmacy is able to get medication approved through insurance and get you the lowest copay possible. If you have not heard from them within 1 week, please call our office at [phone] to let us know.  You have been scheduled to follow up with Dr. Tarri Glenn on March 04, 2021 at 11:10 am.  Thank you for entrusting me with your care and choosing [facility].  Amy Esterwood, PA-C

## 2021-01-29 NOTE — Progress Notes (Signed)
Subjective:    Patient ID: Ian Hall, male    DOB: [birth year], 55 y.o.   MRN: [id number]  HPI Hussien is a pleasant 55 year old Hispanic male, recently established with Dr. 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 As of 01/14/2019 45M ELD 25.  He had been on a trial of steroids which were discontinued due to lack of response. He had large volume paracentesis done on 01/07/2021.  No history of SBP EGD 12/30/2020 with no varices noted though had been noted on cross-sectional imaging. 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 He comes in today for follow-up, last office visit about 2 weeks ago.  He is recently initiated a 2 g sodium diet and has been on Lasix 40 mg p.o. every morning and Aldactone 100 mg p.o. daily.  He says that he remains abstinent from alcohol since December.  He continues to complain of generalized fatigue and has been out of work.  He is short of breath with exertion.  He does feel that he has reaccumulated fluid in his abdomen which he says feels fairly tight.  He feels somewhat bloated and uncomfortable after eating.  He has significantly less edema in his lower extremities.  Apparently at his highest he was 252 pounds and is 220 pounds today stable from 2 weeks ago. He says he still is not sleeping great and when asked how he feels he is mentating he says not good I am forgetful. He has upcoming appointment with [facility] later this month.  Review of Systems Pertinent positive and negative review of systems were noted in the above HPI section.  All other review of systems was otherwise negative.  Outpatient Encounter Medications as of 01/29/2021  Medication Sig  . dicyclomine (BENTYL) 10 MG capsule Take 1 capsule (10 mg total) by mouth 4 (four) times daily -  before meals and at  bedtime.  . folic acid (FOLVITE) 1 MG tablet Take 1 tablet (1 mg total) by mouth daily.  [name] lactulose (CEPHULAC) 10 g packet Take 3 packets (30 g total) by mouth 3 (three) times daily.  . psyllium (METAMUCIL SMOOTH TEXTURE) 28 % packet Take 1 packet by mouth 2 (two) times daily.  . rifaximin (XIFAXAN) 550 MG TABS tablet Take 1 tablet (550 mg total) by mouth 2 (two) times daily.  [name] thiamine 100 MG tablet Take 1 tablet (100 mg total) by mouth daily.  . [DISCONTINUED] furosemide (LASIX) 40 MG tablet Take 1 tablet (40 mg total) by mouth daily.  . [DISCONTINUED] spironolactone (ALDACTONE) 100 MG tablet Take 1 tablet (100 mg total) by mouth daily.  . furosemide (LASIX) 40 MG tablet Take 1 tablet (40 mg total) by mouth daily.  [name] spironolactone (ALDACTONE) 100 MG tablet Take 1 tablet (100 mg total) by mouth daily.   Facility-Administered Encounter Medications as of 01/29/2021  Medication  . albumin human 5 % solution 75 g   Allergies  Allergen Reactions  . Hydrocodone Itching    Noted with hydrocodone cough syrup. Itching only, no hives.  02/03/16: denied itching with recent hydrocodone cough syrup. Only one episode of itching previously.    Patient Active Problem List   Diagnosis Date Noted  . Abdominal swelling, generalized   . Ascites due to alcoholic cirrhosis ([place])   . Jaundice   . 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 Thrombocytopenia ([place]) 01/07/2021  . Alcohol abuse, episodic 01/07/2021  . Alcoholic hepatitis 85/46/2703  . Cirrhosis of liver with ascites ([place])   . Hyperbilirubinemia   . Alcoholic hepatitis with ascites 12/26/2020  . Hepatitis, alcoholic, acute 50/07/3817  . Other viral warts 03/22/2018   Social History   Socioeconomic History  . Marital status: Married    Spouse name: Not on file  . Number of children: 0  . Years of education: Not on file  . Highest education level: Not on file  Occupational History  . Occupation: IT sales professional: [employer]  Tobacco Use  . Smoking status: Former Smoker    Types: Cigarettes    Quit date: 12/16/2002    Years since quitting: 18.1  . Smokeless tobacco: Never Used  Vaping Use  . Vaping Use: Never used  Substance and Sexual Activity  . Alcohol use: Not Currently    Comment: none for 3-4 years (stated 12/16/2020)  . Drug use: No  . Sexual activity: Yes    Birth control/protection: None  Other Topics Concern  . Not on file  Social History Narrative  . Not on file   Social Determinants of Health   Financial Resource Strain: Not on file  Food Insecurity: Not on file  Transportation Needs: Not on file  Physical Activity: Not on file  Stress: Not on file  Social Connections: Not on file  Intimate Partner Violence: Not on file    Mr. Guthmiller family history includes Diabetes in his cousin, father, and mother; Memory loss in his mother.      Objective:    Vitals:   01/29/21 0945  BP: 118/70  Pulse: 86  SpO2: 98%    Physical Exam Well-developed well-nourished older Hispanic male in no acute distress.  Deeply icteric height, Weight, 220, stable BMI 34.5  HEENT; nontraumatic normocephalic, EOMI, PE R LA, sclera icteric. Oropharynx; not examined today Neck; supple, no JVD Cardiovascular; regular rate and rhythm with S1-S2, no murmur rub or gallop Pulmonary; Clear bilaterally Abdomen; protuberant with significant ascites and fluid wave not tightly tense, nontender, no palpable mass or hepatosplenomegaly, bowel sounds are active Rectal; not done today Skin; benign exam, jaundiced Extremities; 1+ edema bilateral lower extremities Neuro/Psych; alert and oriented x4, grossly nonfocal mood and affect appropriate.  No asterixis       Assessment & Plan:   #65  55 year old Hispanic male with decompensated NASH/EtOH cirrhosis, and alcoholic hepatitis.  He did receive a trial of steroids at initial diagnosis but this was discontinued due to lack of response. He continues  abstinence from alcohol since December. He has had a diuresis of about 30 pounds overall though remains stable over the past 2 weeks on Lasix 40 mg daily and Aldactone 100 mg daily.  Most recent M ELD equals 25, reassess pending today's labs  He has had increase in abdominal girth, and has complaints of exertional dyspnea.  #2 altered sleep and daytime forgetfulness-suspect secondary to low-grade hepatic encephalopathy #3 HCC surveillance-up-to-date with ultrasound and CT within the past 2 months, will need every 6 month surveillance #4 previously documented porcelain gallbladder-not a surgical candidate at present-continue to monitor with imaging every 6 months along with [facility] surveillance  #5 colon cancer surveillance-up-to-date with recent colonoscopy February 2022, though poor prep  Plan; reviewed importance of strict 2 g sodium diet For now continue Lasix 40 mg p.o. every morning and Aldactone 100 mg p.o. every morning.  If today's  labs are stable may benefit from increasing diuretics  Will schedule for large-volume paracentesis remove up to 5 L, IV albumin to be given at time of paracentesis. Start Xifaxan 550 mg p.o. twice daily Start lactulose 30 cc p.o. once daily.  Indication for both of these medications was discussed with the patient. CBC with differential, c-Met, venous ammonia pro time/INR today Office follow-up with Dr. Tarri Glenn in about 3 weeks.  Patient knows to call for problems in the interim. He has appointment scheduled with [facility] on 02/12/2020.  We discussed today that no pretransplant evaluation will be considered until he has been abstinent for at least 6 months.[place] Nurse [place] PA-C 01/29/2021   Cc: Maximiano Coss, NP

## 2021-01-30 ENCOUNTER — Ambulatory Visit (HOSPITAL_COMMUNITY)
Admission: RE | Admit: 2021-01-30 | Discharge: 2021-01-30 | Disposition: A | Discharge status: Home / Self Care | Payer: No Typology Code available for payment source | Source: Ambulatory Visit | Attending: Physician Assistant | Admitting: Physician Assistant

## 2021-01-30 ENCOUNTER — Other Ambulatory Visit: Payer: Self-pay

## 2021-01-30 DIAGNOSIS — K7290 Hepatic failure, unspecified without coma: Secondary | ICD-10-CM

## 2021-01-30 DIAGNOSIS — R5383 Other fatigue: Secondary | ICD-10-CM | POA: Diagnosis not present

## 2021-01-30 DIAGNOSIS — K7031 Alcoholic cirrhosis of liver with ascites: Secondary | ICD-10-CM | POA: Insufficient documentation

## 2021-01-30 DIAGNOSIS — K7682 Hepatic encephalopathy: Secondary | ICD-10-CM

## 2021-01-30 DIAGNOSIS — K7460 Unspecified cirrhosis of liver: Secondary | ICD-10-CM

## 2021-01-30 HISTORY — PX: IR PARACENTESIS: IMG2679

## 2021-01-30 MED ORDER — LIDOCAINE HCL 1 % IJ SOLN
INTRAMUSCULAR | Status: DC | PRN
  Administered 2021-01-30: 10 mL

## 2021-01-30 MED ORDER — LIDOCAINE HCL 1 % IJ SOLN
INTRAMUSCULAR | Status: AC
  Filled 2021-01-30: qty 20

## 2021-01-30 NOTE — Procedures (Signed)
PROCEDURE SUMMARY:  Successful image-guided paracentesis from the right lower abdomen.  Yielded 2.4 liters of clear yellow fluid.  No immediate complications.  EBL = trace. Patient tolerated well.   Specimen was not sent for labs.  Please see imaging section of Epic for full dictation.   [place] PA-C 01/30/2021 2:11 PM

## 2021-02-02 ENCOUNTER — Other Ambulatory Visit: Payer: Self-pay

## 2021-02-02 ENCOUNTER — Encounter: Payer: Self-pay | Admitting: Physical Therapy

## 2021-02-02 ENCOUNTER — Ambulatory Visit: Payer: No Typology Code available for payment source | Attending: Registered Nurse | Admitting: Physical Therapy

## 2021-02-02 DIAGNOSIS — Z7409 Other reduced mobility: Secondary | ICD-10-CM | POA: Insufficient documentation

## 2021-02-02 DIAGNOSIS — R262 Difficulty in walking, not elsewhere classified: Secondary | ICD-10-CM | POA: Diagnosis present

## 2021-02-02 DIAGNOSIS — M6281 Muscle weakness (generalized): Secondary | ICD-10-CM

## 2021-02-02 NOTE — Therapy (Signed)
[place] [place], [place], [address] Phone: [phone]   Fax:  [phone]  Physical Therapy Evaluation  Patient Details  Name: Ian Hall MRN: [id number] Date of Birth: 16-Sep-1966 Referring Provider (PT): Hettie Holstein Date: 02/02/2021   PT End of Session - 02/02/21 1352    Visit Number 1    Date for PT Re-Evaluation 04/04/21    PT Start Time 2979    PT Stop Time 8921    PT Time Calculation (min) 34 min    Activity Tolerance Patient tolerated treatment well;Patient limited by fatigue    Behavior During Therapy [facility] for tasks assessed/performed           Past Medical History:  Diagnosis Date  . Anxiety   . Cirrhosis ([place])   . COVID-19     Past Surgical History:  Procedure Laterality Date  . ANKLE SURGERY Right   . IR PARACENTESIS  01/07/2021  . IR PARACENTESIS  01/30/2021  . IR TRANSCATHETER BX  01/09/2021  . IR US GUIDE VASC ACCESS RIGHT  01/09/2021  . IR VENOGRAM HEPATIC W HEMODYNAMIC EVALUATION  01/09/2021    There were no vitals filed for this visit.    Subjective Assessment - 02/02/21 1316    Subjective Pt reports to clinic with debility and deconditioning secondary to diagnosis of alcoholic cirrhosis. Pt reports that since recent exacerbations of cirrhosis he has not been able to walk very far; states can only walk ~50-100 feet before getting short of breath and needing a break. Pt states that O2 sats have been a little low recently. Pt has pain in abdomen that gets worse when he walks. Pt reports that he was told to stay out of full-duty work and caution with heavy lifting.    Pertinent History cirrhosis    Limitations Standing;Walking    How long can you walk comfortably? ~10 min    Patient Stated Goals be able to return to walking and get back to work    Currently in Pain? Yes    Pain Score 4     Pain Location Abdomen    Pain Orientation Mid    Pain Descriptors / Indicators  Tightness;Tender;Pressure    Pain Type Acute pain    Pain Onset More than a month ago    Pain Frequency Intermittent    Aggravating Factors  prolonged standing, walking, lifting    Pain Relieving Factors rest, lying down              [facility] PT Assessment - 02/02/21 0001      Assessment   Medical Diagnosis deconditioning secondary to cirrhosis    Referring Provider (PT) Orland Mustard    Onset Date/Surgical Date --   2nd week in January recent exacerbation; cirrhosis for 2-3 years   Next MD Visit 02/04/21   3/9 with Orland Mustard, 3/16 for liver   Prior Therapy yes for ankle      Precautions   Precautions None      Restrictions   Weight Bearing Restrictions No    Other Position/Activity Restrictions caution with lifting      Balance Screen   Has the patient fallen in the past 6 months No    Has the patient had a decrease in activity level because of a fear of falling?  No    Is the patient reluctant to leave their home because of a fear of falling?  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, carrying      Sensation   Light Touch Appears Intact      Functional Tests   Functional tests Sit to Stand      Sit to Stand   Comments SOB, difficulty with eccentric control, no UE support for first rep but fatigues quickly      ROM / Strength   AROM / PROM / Strength AROM;Strength      Strength   Strength Assessment Site Hip;Knee    Right/Left Hip Right;Left    Right Hip Flexion 4+/5    Right Hip Extension 4/5    Right Hip ABduction 4/5    Left Hip Flexion 4+/5    Left Hip Extension 4/5    Left Hip ABduction 4/5    Right/Left Knee Right;Left    Right Knee Flexion 4+/5    Right Knee Extension 4+/5    Left Knee Flexion 4+/5    Left  Knee Extension 4+/5      Transfers   Five time sit to stand comments  30.74 sec; UEs on thighs after 3 reps   97% O2 after, reports SOB     Ambulation/Gait   Ambulation/Gait Yes    Ambulation/Gait Assistance 7: Independent    Ambulation Distance (Feet) 50 Feet    Assistive device None    Gait Pattern Decreased arm swing - left;Decreased arm swing - right;Decreased step length - right;Decreased step length - left    Gait velocity diminished    Stairs Yes    Stairs Assistance 7: Independent    Stair Management Technique One rail Right;Alternating pattern    Number of Stairs 8    Height of Stairs 6    Gait Comments decreased cadence, decreased trunk rotation, SOB walking across clinic      Standardized Balance Assessment   Standardized Balance Assessment Timed Up and Go Test      Timed Up and Go Test   Normal TUG (seconds) 18.34    TUG Comments no AD                      Objective measurements completed on examination: See above findings.       [facility] Adult PT Treatment/Exercise - 02/02/21 0001      Exercises   Exercises Knee/Hip      Knee/Hip Exercises: Standing   Hip Flexion Both;1 set;10 reps    Hip Abduction Both;1 set;10 reps    Hip Extension Both;1 set;10 reps      Knee/Hip Exercises: Seated   Sit to Sand 1 set;5 reps;without UE support;with UE support                  PT Education - 02/02/21 1351    Education Details Pt educated on POC and HEP    Person(s) Educated Patient    Methods Explanation;Demonstration;Handout    Comprehension Verbalized understanding;Returned demonstration            PT Short Term Goals - 02/02/21 1401      PT SHORT TERM GOAL #1   Title Pt will be I with initial HEP    Time 2    Period Weeks    Status  New    Target Date 02/16/21             PT Long Term Goals - 02/02/21 1401      PT LONG TERM GOAL #1   Title Pt will demo 3MWT with no standing rest breaks and RPE <5/10    Time 8    Period Weeks     Status New    Target Date 03/30/21      PT LONG TERM GOAL #2   Title Pt will demo 5xSTS <15 sec with no UE assist    Baseline 30 sec with UE assist for several reps    Time 8    Period Weeks    Status New    Target Date 03/30/21      PT LONG TERM GOAL #3   Title Pt will demo TUG <15 sec with <4/10 RPE    Baseline 18 sec 5/10 RPE    Time 8    Period Weeks    Status New    Target Date 03/30/21      PT LONG TERM GOAL #4   Title Pt will report able to complete ADLs with no increase in SOB    Time 8    Period Weeks    Status New    Target Date 03/30/21                  Plan - 02/02/21 1353    Clinical Impression Statement Pt presents to clinic with debility and deconditioning secondary to diagnosis of alcoholic cirrhosis. Pt demos significant endurance deficits with SOB walking >30 ft across clinic. Gait with decreased arm swing, decreased trunk rotation, and decreased cadence. Pt demos 5x STS >30 sec; quick to fatigue relying on UE support after 3 reps and SOB after. Required HR for ascending/descending stairs + SOB after activity. Demos LE strength deficits on MMT and functionally. O2 sats 97% throughout rx. Pt was working with [employer]; has had to go on leave from job because they do not have light duty options available. Job requires 10 hour days on feet along with frequent heavy lifting/pushing/pulling. Pt would benefit from skilled PT to return to PLOF.    Personal Factors and Comorbidities Comorbidity 1    Comorbidities cirrhosis    Examination-Activity Limitations Stand;Stairs;Locomotion Level    Examination-Participation Restrictions Occupation;Community Activity;Interpersonal Relationship    Stability/Clinical Decision Making Stable/Uncomplicated    Clinical Decision Making Low    Rehab Potential Good    PT Frequency 2x / week    PT Duration 8 weeks    PT Treatment/Interventions ADLs/Self Care Home Management;Gait training;Stair training;Therapeutic  activities;Therapeutic exercise;Neuromuscular re-education;Patient/family education;Manual techniques    PT Next Visit Plan caution with heavy lifting d/t abdominal pain, gentle progression of strengthening. Endurance ex's and functional strengthening. Monitor O2 as indicated.    PT Home Exercise Plan STS, standing hip abd/ext/flex, bridges    Consulted and Agree with Plan of Care Patient           Patient will benefit from skilled therapeutic intervention in order to improve the following deficits and impairments:  Abnormal gait,Difficulty walking,Decreased endurance,Decreased activity tolerance,Pain,Decreased strength,Decreased mobility  Visit Diagnosis: Muscle weakness (generalized)  Difficulty in walking, not elsewhere classified  Decreased functional mobility and endurance     Problem List Patient Active Problem List   Diagnosis Date Noted  . Abdominal swelling, generalized   . Ascites due to alcoholic cirrhosis ([place])   . Jaundice   . 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  Thrombocytopenia ([place]) 01/07/2021  . Alcohol abuse, episodic 01/07/2021  . Alcoholic hepatitis 35/59/7416  . Cirrhosis of liver with ascites ([place])   . Hyperbilirubinemia   . Alcoholic hepatitis with ascites 12/26/2020  . Hepatitis, alcoholic, acute 38/45/3646  . Other viral warts 03/22/2018   Amador Cunas, PT, DPT Donald Prose Sugg 02/02/2021, 2:04 PM  [address] [place] [place], [place], [address] Phone: [phone]   Fax:  [phone]  Name: Ian Hall MRN: [id number] Date of Birth: Jul 01, 1966

## 2021-02-02 NOTE — Patient Instructions (Signed)
Access Code: [email or web address] URL: [email or web address] Date: 02/02/2021 Prepared by: Amador Cunas  Exercises Supine Bridge - 1 x daily - 7 x weekly - 3 sets - 10 reps - 3 sec hold Sit to Stand Without Arm Support - 1 x daily - 7 x weekly - 3 sets - 5 reps Standing Hip Abduction with Counter Support - 1 x daily - 7 x weekly - 3 sets - 10 reps Standing Hip Extension with Counter Support - 1 x daily - 7 x weekly - 3 sets - 10 reps Standing March with Counter Support - 1 x daily - 7 x weekly - 3 sets - 10 reps

## 2021-02-04 ENCOUNTER — Other Ambulatory Visit: Payer: Self-pay

## 2021-02-04 ENCOUNTER — Ambulatory Visit: Payer: No Typology Code available for payment source | Admitting: Nurse Practitioner

## 2021-02-04 ENCOUNTER — Ambulatory Visit (INDEPENDENT_AMBULATORY_CARE_PROVIDER_SITE_OTHER): Payer: No Typology Code available for payment source | Admitting: Registered Nurse

## 2021-02-04 ENCOUNTER — Encounter: Payer: Self-pay | Admitting: Registered Nurse

## 2021-02-04 VITALS — BP 118/73 | HR 84 | Temp 98.0°F | Resp 18 | Ht 67.0 in | Wt 219.8 lb

## 2021-02-04 DIAGNOSIS — D638 Anemia in other chronic diseases classified elsewhere: Secondary | ICD-10-CM

## 2021-02-04 DIAGNOSIS — K7290 Hepatic failure, unspecified without coma: Secondary | ICD-10-CM | POA: Diagnosis not present

## 2021-02-04 DIAGNOSIS — K7460 Unspecified cirrhosis of liver: Secondary | ICD-10-CM | POA: Diagnosis not present

## 2021-02-04 DIAGNOSIS — L298 Other pruritus: Secondary | ICD-10-CM

## 2021-02-04 DIAGNOSIS — K760 Fatty (change of) liver, not elsewhere classified: Secondary | ICD-10-CM

## 2021-02-04 DIAGNOSIS — R252 Cramp and spasm: Secondary | ICD-10-CM

## 2021-02-04 DIAGNOSIS — G894 Chronic pain syndrome: Secondary | ICD-10-CM

## 2021-02-04 MED ORDER — HYDROXYZINE HCL 10 MG PO TABS: 5.0000 mg | ORAL_TABLET | Freq: Every evening | ORAL | 0 refills | Status: DC | PRN

## 2021-02-04 NOTE — Patient Instructions (Addendum)
[place], [place], [place] [address]     [place], [place] [address]        If you have lab work done today you will be contacted with your lab results within the next 2 weeks.  If you have not heard from [place] then please contact us. The fastest way to get your results is to register for My Chart.   IF you received an x-ray today, you will receive an invoice from [facility]. Please contact [facility] at [phone] with questions or concerns regarding your invoice.   IF you received labwork today, you will receive an invoice from [facility]. Please contact [facility] at [phone] with questions or concerns regarding your invoice.   Our billing staff will not be able to assist you with questions regarding bills from these companies.  You will be contacted with the lab results as soon as they are available. The fastest way to get your results is to activate your My Chart account. Instructions are located on the last page of this paperwork. If you have not heard from [place] regarding the results in 2 weeks, please contact this office.

## 2021-02-05 ENCOUNTER — Other Ambulatory Visit: Payer: Self-pay | Admitting: Family Medicine

## 2021-02-05 ENCOUNTER — Telehealth: Payer: Self-pay | Admitting: *Deleted

## 2021-02-05 LAB — COMPREHENSIVE METABOLIC PANEL
ALT: 61 IU/L — ABNORMAL HIGH (ref 0–44)
AST: 181 IU/L — ABNORMAL HIGH (ref 0–40)
Albumin/Globulin Ratio: 0.9 — ABNORMAL LOW (ref 1.2–2.2)
Albumin: 2.7 g/dL — ABNORMAL LOW (ref 3.8–4.9)
Alkaline Phosphatase: 420 IU/L — ABNORMAL HIGH (ref 44–121)
BUN/Creatinine Ratio: 12 (ref 9–20)
BUN: 10 mg/dL (ref 6–24)
Bilirubin Total: 13.5 mg/dL (ref 0.0–1.2)
CO2: 17 mmol/L — ABNORMAL LOW (ref 20–29)
Calcium: 8.1 mg/dL — ABNORMAL LOW (ref 8.7–10.2)
Chloride: 86 mmol/L — ABNORMAL LOW (ref 96–106)
Creatinine, Ser: 0.82 mg/dL (ref 0.76–1.27)
Globulin, Total: 2.9 g/dL (ref 1.5–4.5)
Glucose: 115 mg/dL — ABNORMAL HIGH (ref 65–99)
Potassium: 5.1 mmol/L (ref 3.5–5.2)
Sodium: 118 mmol/L — CL (ref 134–144)
Total Protein: 5.6 g/dL — ABNORMAL LOW (ref 6.0–8.5)
eGFR: 104 mL/min/{1.73_m2} (ref >59)

## 2021-02-05 LAB — CBC
Hematocrit: 34.5 % — ABNORMAL LOW (ref 37.5–51.0)
Hemoglobin: 13 g/dL (ref 13.0–17.7)
MCH: 34.1 pg — ABNORMAL HIGH (ref 26.6–33.0)
MCHC: 37.7 g/dL — ABNORMAL HIGH (ref 31.5–35.7)
MCV: 91 fL (ref 79–97)
Platelets: 93 10*3/uL — CL (ref 150–450)
RBC: 3.81 x10E6/uL — ABNORMAL LOW (ref 4.14–5.80)
RDW: 14.3 % (ref 11.6–15.4)
WBC: 11 10*3/uL — ABNORMAL HIGH (ref 3.4–10.8)

## 2021-02-05 LAB — HEMOGLOBIN A1C
Est. average glucose Bld gHb Est-mCnc: 77 mg/dL
Hgb A1c MFr Bld: 4.3 % — ABNORMAL LOW (ref 4.8–5.6)

## 2021-02-05 LAB — MAGNESIUM: Magnesium: 1.6 mg/dL (ref 1.6–2.3)

## 2021-02-05 NOTE — Telephone Encounter (Signed)
Thank you for letting me know. Agree with sending to the ED.

## 2021-02-05 NOTE — Telephone Encounter (Signed)
Santiago Glad, Customer Service Representative called to report Sodium 118 collected 02/04/21 at 1557; value repeated for accuracy; attempted to contact flow coordinator x 3 without success; will route to office high priority for notification.

## 2021-02-05 NOTE — Telephone Encounter (Signed)
Thanks for the update

## 2021-02-05 NOTE — Telephone Encounter (Signed)
Critical lab notification , please advise .

## 2021-02-05 NOTE — Progress Notes (Signed)
Notified patient of low sodium and potassium increasing Encouraged to go to the ED. He verbalized he would like to drive himself All questions answered.   Huston Foley Evann Koelzer, FNP-BC [facility] at [place],  [id number] [phone] - [phone]

## 2021-02-05 NOTE — Telephone Encounter (Signed)
FYI -  Labs showed hyponatremia to 118. Pt has been made aware and referred to ER by Huston Foley Just, NP.   Thank you  Rich

## 2021-02-06 ENCOUNTER — Inpatient Hospital Stay (HOSPITAL_COMMUNITY): Payer: No Typology Code available for payment source

## 2021-02-06 ENCOUNTER — Telehealth: Payer: Self-pay

## 2021-02-06 ENCOUNTER — Encounter (HOSPITAL_COMMUNITY): Payer: Self-pay

## 2021-02-06 ENCOUNTER — Inpatient Hospital Stay (HOSPITAL_COMMUNITY)
Admission: EM | Admit: 2021-02-06 | Discharge: 2021-02-12 | DRG: 640 | Disposition: A | Discharge status: Home / Self Care | Payer: No Typology Code available for payment source | Source: Ambulatory Visit | Attending: Internal Medicine | Admitting: Internal Medicine

## 2021-02-06 ENCOUNTER — Telehealth: Payer: Self-pay | Admitting: Registered Nurse

## 2021-02-06 DIAGNOSIS — Z87891 Personal history of nicotine dependence: Secondary | ICD-10-CM

## 2021-02-06 DIAGNOSIS — Z79899 Other long term (current) drug therapy: Secondary | ICD-10-CM

## 2021-02-06 DIAGNOSIS — R0600 Dyspnea, unspecified: Secondary | ICD-10-CM

## 2021-02-06 DIAGNOSIS — U071 COVID-19: Secondary | ICD-10-CM | POA: Diagnosis present

## 2021-02-06 DIAGNOSIS — E669 Obesity, unspecified: Secondary | ICD-10-CM | POA: Diagnosis present

## 2021-02-06 DIAGNOSIS — Z6834 Body mass index (BMI) 34.0-34.9, adult: Secondary | ICD-10-CM

## 2021-02-06 DIAGNOSIS — F419 Anxiety disorder, unspecified: Secondary | ICD-10-CM | POA: Diagnosis present

## 2021-02-06 DIAGNOSIS — R188 Other ascites: Secondary | ICD-10-CM | POA: Diagnosis not present

## 2021-02-06 DIAGNOSIS — I82441 Acute embolism and thrombosis of right tibial vein: Secondary | ICD-10-CM | POA: Diagnosis present

## 2021-02-06 DIAGNOSIS — K7581 Nonalcoholic steatohepatitis (NASH): Secondary | ICD-10-CM | POA: Diagnosis present

## 2021-02-06 DIAGNOSIS — F1010 Alcohol abuse, uncomplicated: Secondary | ICD-10-CM | POA: Diagnosis present

## 2021-02-06 DIAGNOSIS — K7031 Alcoholic cirrhosis of liver with ascites: Secondary | ICD-10-CM | POA: Diagnosis present

## 2021-02-06 DIAGNOSIS — D689 Coagulation defect, unspecified: Secondary | ICD-10-CM | POA: Diagnosis present

## 2021-02-06 DIAGNOSIS — D731 Hypersplenism: Secondary | ICD-10-CM | POA: Diagnosis present

## 2021-02-06 DIAGNOSIS — E871 Hypo-osmolality and hyponatremia: Secondary | ICD-10-CM | POA: Diagnosis not present

## 2021-02-06 DIAGNOSIS — E872 Acidosis: Secondary | ICD-10-CM | POA: Diagnosis present

## 2021-02-06 DIAGNOSIS — D6959 Other secondary thrombocytopenia: Secondary | ICD-10-CM | POA: Diagnosis present

## 2021-02-06 DIAGNOSIS — I82451 Acute embolism and thrombosis of right peroneal vein: Secondary | ICD-10-CM | POA: Diagnosis not present

## 2021-02-06 DIAGNOSIS — R0609 Other forms of dyspnea: Secondary | ICD-10-CM | POA: Insufficient documentation

## 2021-02-06 LAB — COMPREHENSIVE METABOLIC PANEL
ALT: 61 U/L — ABNORMAL HIGH (ref 0–44)
AST: 168 U/L — ABNORMAL HIGH (ref 15–41)
Albumin: 1.8 g/dL — ABNORMAL LOW (ref 3.5–5.0)
Alkaline Phosphatase: 324 U/L — ABNORMAL HIGH (ref 38–126)
Anion gap: 7 (ref 5–15)
BUN: 14 mg/dL (ref 6–20)
CO2: 21 mmol/L — ABNORMAL LOW (ref 22–32)
Calcium: 7.9 mg/dL — ABNORMAL LOW (ref 8.9–10.3)
Chloride: 90 mmol/L — ABNORMAL LOW (ref 98–111)
Creatinine, Ser: 1.06 mg/dL (ref 0.61–1.24)
GFR, Estimated: >60 mL/min (ref >60)
Glucose, Bld: 108 mg/dL — ABNORMAL HIGH (ref 70–99)
Potassium: 4.5 mmol/L (ref 3.5–5.1)
Sodium: 118 mmol/L — CL (ref 135–145)
Total Bilirubin: 13.6 mg/dL — ABNORMAL HIGH (ref 0.3–1.2)
Total Protein: 5.6 g/dL — ABNORMAL LOW (ref 6.5–8.1)

## 2021-02-06 LAB — PROTIME-INR
INR: 1.8 — ABNORMAL HIGH (ref 0.8–1.2)
Prothrombin Time: 19.9 seconds — ABNORMAL HIGH (ref 11.4–15.2)

## 2021-02-06 LAB — URINALYSIS, ROUTINE W REFLEX MICROSCOPIC
Glucose, UA: NEGATIVE mg/dL
Ketones, ur: NEGATIVE mg/dL
Leukocytes,Ua: NEGATIVE
Nitrite: NEGATIVE
Protein, ur: NEGATIVE mg/dL
Specific Gravity, Urine: 1.015 (ref 1.005–1.030)
pH: 5 (ref 5.0–8.0)

## 2021-02-06 LAB — RESP PANEL BY RT-PCR (FLU A&B, COVID) ARPGX2
Influenza A by PCR: NEGATIVE
Influenza B by PCR: NEGATIVE
SARS Coronavirus 2 by RT PCR: POSITIVE — AB

## 2021-02-06 LAB — LIPASE, BLOOD: Lipase: 61 U/L — ABNORMAL HIGH (ref 11–51)

## 2021-02-06 LAB — CBC
HCT: 35 % — ABNORMAL LOW (ref 39.0–52.0)
Hemoglobin: 12.6 g/dL — ABNORMAL LOW (ref 13.0–17.0)
MCH: 34.3 pg — ABNORMAL HIGH (ref 26.0–34.0)
MCHC: 36 g/dL (ref 30.0–36.0)
MCV: 95.4 fL (ref 80.0–100.0)
Platelets: DECREASED 10*3/uL (ref 150–400)
RBC: 3.67 MIL/uL — ABNORMAL LOW (ref 4.22–5.81)
RDW: 14.8 % (ref 11.5–15.5)
WBC: 9.4 10*3/uL (ref 4.0–10.5)
nRBC: 0 % (ref 0.0–0.2)

## 2021-02-06 LAB — AMMONIA: Ammonia: 71 umol/L — ABNORMAL HIGH (ref 9–35)

## 2021-02-06 LAB — SODIUM, URINE, RANDOM: Sodium, Ur: 10 mmol/L

## 2021-02-06 MED ORDER — ALBUMIN HUMAN 25 % IV SOLN
25.0000 g | Freq: Four times a day (QID) | INTRAVENOUS | Status: AC
  Administered 2021-02-06 – 2021-02-07 (×4): 25 g via INTRAVENOUS
  Filled 2021-02-06 (×4): qty 100

## 2021-02-06 MED ORDER — SODIUM CHLORIDE 0.9 % IV BOLUS
1000.0000 mL | Freq: Once | INTRAVENOUS | Status: AC
  Administered 2021-02-06: 1000 mL via INTRAVENOUS

## 2021-02-06 MED ORDER — SODIUM CHLORIDE 1 G PO TABS
1.0000 g | ORAL_TABLET | Freq: Two times a day (BID) | ORAL | Status: DC
  Filled 2021-02-06 (×2): qty 1

## 2021-02-06 MED ORDER — PHYTONADIONE 5 MG PO TABS
5.0000 mg | ORAL_TABLET | Freq: Once | ORAL | Status: AC
  Administered 2021-02-06: 5 mg via ORAL
  Filled 2021-02-06 (×2): qty 1

## 2021-02-06 MED ORDER — SPIRONOLACTONE 25 MG PO TABS
100.0000 mg | ORAL_TABLET | Freq: Every day | ORAL | Status: DC
  Administered 2021-02-07 – 2021-02-12 (×6): 100 mg via ORAL
  Filled 2021-02-06: qty 1
  Filled 2021-02-06 (×6): qty 4

## 2021-02-06 MED ORDER — DICYCLOMINE HCL 10 MG PO CAPS
10.0000 mg | ORAL_CAPSULE | Freq: Three times a day (TID) | ORAL | Status: DC
  Administered 2021-02-06 – 2021-02-12 (×22): 10 mg via ORAL
  Filled 2021-02-06 (×27): qty 1

## 2021-02-06 MED ORDER — FUROSEMIDE 40 MG PO TABS
40.0000 mg | ORAL_TABLET | Freq: Every day | ORAL | Status: DC
  Administered 2021-02-07 – 2021-02-12 (×6): 40 mg via ORAL
  Filled 2021-02-06: qty 1
  Filled 2021-02-06: qty 2
  Filled 2021-02-06 (×4): qty 1

## 2021-02-06 MED ORDER — LACTULOSE 10 GM/15ML PO SOLN
30.0000 g | Freq: Three times a day (TID) | ORAL | Status: DC
  Administered 2021-02-06 – 2021-02-12 (×18): 30 g via ORAL
  Filled 2021-02-06 (×11): qty 45
  Filled 2021-02-06: qty 60
  Filled 2021-02-06 (×8): qty 45

## 2021-02-06 MED ORDER — RIFAXIMIN 550 MG PO TABS
550.0000 mg | ORAL_TABLET | Freq: Two times a day (BID) | ORAL | Status: DC
  Administered 2021-02-06 – 2021-02-12 (×12): 550 mg via ORAL
  Filled 2021-02-06 (×15): qty 1

## 2021-02-06 MED ORDER — HYDROXYZINE HCL 10 MG PO TABS
10.0000 mg | ORAL_TABLET | Freq: Every evening | ORAL | Status: DC | PRN
  Filled 2021-02-06: qty 1

## 2021-02-06 MED ORDER — SODIUM BICARBONATE 650 MG PO TABS
650.0000 mg | ORAL_TABLET | Freq: Two times a day (BID) | ORAL | Status: DC
  Administered 2021-02-06 – 2021-02-12 (×12): 650 mg via ORAL
  Filled 2021-02-06 (×15): qty 1

## 2021-02-06 NOTE — Telephone Encounter (Signed)
Emergent lab call taken from [facility] [place], [place] informed Billie total 13.5, platelet count 93, message sent to provider. Pt is currently in ER

## 2021-02-06 NOTE — ED Provider Notes (Signed)
[facility] EMERGENCY DEPARTMENT Provider Note   CSN: [id number] Arrival date & time: 02/06/21  1329     History Chief Complaint  Patient presents with  . Abnormal Lab    Ian Hall is a 55 y.o. male.  HPI 55 year old male with history of cirrhosis and anxiety presents to the ER with reports of a very low sodium.  He states that he was told to come here by his PCP.  Sodium of 118 reviewed from 2 days ago, confirmed here with triage labs.  He does feel a level of fullness in his abdomen, denies any fevers, chills, no peritoneal signs.  Last paracentesis was 3 weeks ago.  He reports that he has also felt sleepy due to starting to take some itching medicine.  He states that he has been having itching in his abdomen and in his back.  Denies any nausea, vomiting, back pain, dysuria, hematuria or dizziness    Past Medical History:  Diagnosis Date  . Anxiety   . Cirrhosis ([place])   . COVID-19     Patient Active Problem List   Diagnosis Date Noted  . Abdominal swelling, generalized   . Ascites due to alcoholic cirrhosis ([place])   . Jaundice   . 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 Thrombocytopenia ([place]) 01/07/2021  . Alcohol abuse, episodic 01/07/2021  . Alcoholic hepatitis 38/25/0539  . Cirrhosis of liver with ascites ([place])   . Hyperbilirubinemia   . Alcoholic hepatitis with ascites 12/26/2020  . Hepatitis, alcoholic, acute 76/73/4193  . 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 ANKLE SURGERY Right   . IR PARACENTESIS  01/07/2021  . IR PARACENTESIS  01/30/2021  . IR TRANSCATHETER BX  01/09/2021  . IR US GUIDE VASC ACCESS RIGHT  01/09/2021  . IR VENOGRAM HEPATIC W HEMODYNAMIC EVALUATION  01/09/2021       Family History  Problem Relation Age of Onset  . Diabetes Mother   . Memory loss Mother   . Diabetes Father   . Diabetes Cousin     Social History   Tobacco Use  . Smoking status: Former Smoker    Types:  Cigarettes    Quit date: 12/16/2002    Years since quitting: 18.1  . Smokeless tobacco: Never Used  Vaping Use  . Vaping Use: Never used  Substance Use Topics  . Alcohol use: Not Currently    Comment: none for 3-4 years (stated 12/16/2020)  . Drug use: No    Home Medications Prior to Admission medications   Medication Sig Start Date End Date Taking? Authorizing Provider  dicyclomine (BENTYL) 10 MG capsule Take 1 capsule (10 mg total) by mouth 4 (four) times daily -  before meals and at bedtime. 12/16/20  Yes [place], MD  folic acid (FOLVITE) 1 MG tablet Take 1 tablet (1 mg total) by mouth daily. 12/29/20  Yes Shelly Coss, MD  furosemide (LASIX) 40 MG tablet Take 1 tablet (40 mg total) by mouth daily. 01/29/21  Yes Esterwood, Amy S, PA-C  hydrOXYzine (ATARAX/VISTARIL) 10 MG tablet Take 0.5-1 tablets (5-10 mg total) by mouth at bedtime as needed for itching. 02/04/21  Yes Maximiano Coss, NP  lactulose (CEPHULAC) 10 g packet Take 3 packets (30 g total) by mouth 3 (three) times daily. 01/29/21  Yes Esterwood, Amy S, PA-C  spironolactone (ALDACTONE) 100 MG tablet Take 1 tablet (100 mg total) by mouth daily. 01/29/21  Yes Esterwood, Amy S, PA-C  rifaximin (XIFAXAN) 550 MG TABS tablet Take 1 tablet (550 mg total) by mouth 2 (two) times daily. Patient not taking: No sig reported 01/29/21   Esterwood, Amy S, PA-C  thiamine 100 MG tablet Take 1 tablet (100 mg total) by mouth daily. Patient not taking: No sig reported 12/29/20   Shelly Coss, MD    Allergies    Hydrocodone  Review of Systems   Review of Systems  Constitutional: Negative for chills and fever.  HENT: Negative for ear pain and sore throat.   Eyes: Negative for pain and visual disturbance.  Respiratory: Negative for cough and shortness of breath.   Cardiovascular: Negative for chest pain and palpitations.  Gastrointestinal: Negative for abdominal pain and vomiting.  Genitourinary: Negative for dysuria and hematuria.   Musculoskeletal: Negative for arthralgias and back pain.  Skin: Negative for color change and rash.  Neurological: Negative for seizures and syncope.  All other systems reviewed and are negative.   Physical Exam Updated Vital Signs BP 124/76   Pulse 73   Temp 98.4 F (36.9 C)   Resp 16   SpO2 100%   Physical Exam Vitals and nursing note reviewed.  Constitutional:      General: He is not in acute distress.    Appearance: Normal appearance. He is well-developed. He is not ill-appearing, toxic-appearing or diaphoretic.  HENT:     Head: Normocephalic and atraumatic.  Eyes:     General: Scleral icterus present.     Conjunctiva/sclera: Conjunctivae normal.  Cardiovascular:     Rate and Rhythm: Normal rate and regular rhythm.     Pulses: Normal pulses.     Heart sounds: Normal heart sounds. No murmur heard.   Pulmonary:     Effort: Pulmonary effort is normal. No respiratory distress.     Breath sounds: Normal breath sounds.  Abdominal:     Palpations: Abdomen is soft.     Tenderness: There is no abdominal tenderness.     Comments: Abdomen is distended, soft, no peritoneal signs, no point tenderness  Musculoskeletal:        General: Normal range of motion.     Cervical back: Normal range of motion and neck supple.     Right lower leg: No edema.     Left lower leg: No edema.  Skin:    General: Skin is warm and dry.  Neurological:     General: No focal deficit present.     Mental Status: He is alert.  Psychiatric:        Mood and Affect: Mood normal.        Behavior: Behavior normal.     ED Results / Procedures / Treatments   Labs (all labs ordered are listed, but only abnormal results are displayed) Labs Reviewed  LIPASE, BLOOD - Abnormal; Notable for the following components:      Result Value   Lipase 61 (*)    All other components within normal limits  COMPREHENSIVE METABOLIC PANEL - Abnormal; Notable for the following components:   Sodium 118 (*)     Chloride 90 (*)    CO2 21 (*)    Glucose, Bld 108 (*)    Calcium 7.9 (*)    Total Protein 5.6 (*)    Albumin 1.8 (*)    AST 168 (*)    ALT 61 (*)    Alkaline Phosphatase 324 (*)    Total Bilirubin 13.6 (*)    All other components within normal limits  CBC -  Abnormal; Notable for the following components:   RBC 3.67 (*)    Hemoglobin 12.6 (*)    HCT 35.0 (*)    MCH 34.3 (*)    All other components within normal limits  AMMONIA - Abnormal; Notable for the following components:   Ammonia 71 (*)    All other components within normal limits  URINALYSIS, ROUTINE W REFLEX MICROSCOPIC - Abnormal; Notable for the following components:   Color, Urine AMBER (*)    APPearance HAZY (*)    Hgb urine dipstick MODERATE (*)    Bilirubin Urine MODERATE (*)    Bacteria, UA RARE (*)    All other components within normal limits  RESP PANEL BY RT-PCR (FLU A&B, COVID) ARPGX2  SODIUM, URINE, RANDOM    EKG None  Radiology No results found.  Procedures Procedures   Medications Ordered in ED Medications  sodium chloride 0.9 % bolus 1,000 mL (0 mLs Intravenous Stopped 02/06/21 1612)    ED Course  I have reviewed the triage vital signs and the nursing notes.  Pertinent labs & imaging results that were available during my care of the patient were reviewed by me and considered in my medical decision making (see chart for details).    MDM Rules/Calculators/A&P                          55 year old male who presents to the ER with hyponatremia, confirmed sodium of 118.  Likely secondary to liver failure.  He has a mildly distended abdomen on exam, though no evidence of SBP, no peritoneal signs.  His lab work show a bilirubin of 13, though this appears to be at baseline.  LFTs are elevated but also appear to be stable.  BC without leukocytosis, hemoglobin 12.6.  Ammonia of 71, not significant elevated from baseline.  UA with moderate hemoglobin, bilirubin which again appears to be at baseline.  EKG  without any significant changes.  Initiated fluid resuscitation, urine sodium added on.  Consulted hospitalist Dr. Roosevelt Locks who will admit the patient for further evaluation and treatment.  Patient is stable for admission at this time. Final Clinical Impression(s) / ED Diagnoses Final diagnoses:  Hyponatremia  Alcoholic cirrhosis of liver with ascites [facility])    Rx / DC Orders ED Discharge Orders    None       Lyndel Safe 02/06/21 1630    Tegeler, Gwenyth Allegra, MD 02/07/21 1505

## 2021-02-06 NOTE — Telephone Encounter (Signed)
I too called the patient, straight to voicemail. Emphasized that this may be life or death if sodium levels are continuing to drop - this cannot be something that is deferred for financial concerns.  Brooke - thank you for calling, no further action needed - just documenting that I followed up on this as well  Kathrin Ruddy, NP

## 2021-02-06 NOTE — ED Triage Notes (Signed)
Pt sent by PCP for further evaluation of critically low sodium level of 118. Pt is jaundice and has hx of liver disease, last paracentesis was 2 weeks ago. Pt c.o intermittent abd pain and tightness. Pt a.o at this time

## 2021-02-06 NOTE — Telephone Encounter (Signed)
Spoke to patient about his Critical labs that was sent from [facility] and he stated he would try to go to the ER today, the reason he did not go yesterday is because he does not have a lot of money to keep paying a doctor at this time.

## 2021-02-06 NOTE — Telephone Encounter (Signed)
Patient is calling to let is Provider know that he is in the ER now.

## 2021-02-06 NOTE — ED Notes (Signed)
Dinner Tray Ordered @ 1714. 

## 2021-02-06 NOTE — Telephone Encounter (Signed)
FYI

## 2021-02-06 NOTE — H&P (Addendum)
History and Physical    Ian Hall ZCH:[id number] DOB: 1965-12-27 DOA: 02/06/2021  PCP: Maximiano Coss, NP (Confirm with patient/family/NH records and if not entered, this has to be entered at [facility] point of entry) Patient coming from: Home  I have personally briefly reviewed patient's old medical records in [place]  Chief Complaint: Feeling weak  HPI: Ian Hall is a 55 y.o. 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  Patient has been feeling generalized weakness and exertional dyspnea for the last 3 to 4 weeks.  He has been losing weight, from 222 lbs 2 months ago to 219 lbs today.  He has been compliant with his versus medication including Lasix and Aldactone and lactulose.  He underwent to paracentesis every 2 to 3 weeks, and last paracentesis was 3 weeks ago.  He is used able to walk 20 to 30 minutes without any problem but lately he found he started to feel shortness of breath after less than 10 minutes walk, denied any cough, no chest pains.  No fever chills.  Today she went to see PCP who did lab work in the office showed sodium 118.  Patient reported that he drinks 4 bottles of 500 mL water every day, in addition he eats 5-6 servings of fruits and 2 serves of salad and oatmeal with milk, so I estimate his daily free water with everything including is ~3000 ml. He does enforcing 2 gram salt restriction.  ED Course: Repeat sodium by 18. Urine sodium<10.  Review of Systems: As per HPI otherwise 14 point review of systems negative.    Past Medical History:  Diagnosis Date  . Anxiety   . Cirrhosis ([place])   . COVID-19     Past Surgical History:  Procedure Laterality Date  . ANKLE SURGERY Right   . IR PARACENTESIS  01/07/2021  . IR PARACENTESIS  01/30/2021  . IR TRANSCATHETER BX  01/09/2021  . IR US GUIDE VASC ACCESS RIGHT  01/09/2021  . IR VENOGRAM HEPATIC W  HEMODYNAMIC EVALUATION  01/09/2021     reports that he quit smoking about 18 years ago. His smoking use included cigarettes. He has never used smokeless tobacco. He reports previous alcohol use. He reports that he does not use drugs.  Allergies  Allergen Reactions  . Hydrocodone Itching and Other (See Comments)    Noted with hydrocodone cough syrup. Itching only, no hives.  02/03/16: denied itching with recent hydrocodone cough syrup. Only one episode of itching previously.     Family History  Problem Relation Age of Onset  . Diabetes Mother   . Memory loss Mother   . Diabetes Father   . Diabetes Cousin      Prior to Admission medications   Medication Sig Start Date End Date Taking? Authorizing Provider  dicyclomine (BENTYL) 10 MG capsule Take 1 capsule (10 mg total) by mouth 4 (four) times daily -  before meals and at bedtime. 12/16/20  Yes Thornton Park, MD  folic acid (FOLVITE) 1 MG tablet Take 1 tablet (1 mg total) by mouth daily. 12/29/20  Yes Shelly Coss, MD  furosemide (LASIX) 40 MG tablet Take 1 tablet (40 mg total) by mouth daily. 01/29/21  Yes Esterwood, Amy S, PA-C  hydrOXYzine (ATARAX/VISTARIL) 10 MG tablet Take 0.5-1 tablets (5-10 mg total) by mouth at bedtime as needed for itching. 02/04/21  Yes Maximiano Coss, NP  lactulose ([place])  10 g packet Take 3 packets (30 g total) by mouth 3 (three) times daily. 01/29/21  Yes Esterwood, Amy S, PA-C  spironolactone (ALDACTONE) 100 MG tablet Take 1 tablet (100 mg total) by mouth daily. 01/29/21  Yes Esterwood, Amy S, PA-C  rifaximin (XIFAXAN) 550 MG TABS tablet Take 1 tablet (550 mg total) by mouth 2 (two) times daily. Patient not taking: No sig reported 01/29/21   Esterwood, Amy S, PA-C  thiamine 100 MG tablet Take 1 tablet (100 mg total) by mouth daily. Patient not taking: No sig reported 12/29/20   Shelly Coss, MD    Physical Exam: Vitals:   02/06/21 1530 02/06/21 1545 02/06/21 1600 02/06/21 1615  BP: 120/67 (!) 116/56  130/69 124/76  Pulse: 77 78 80 73  Resp: 14 20  16   Temp:      SpO2: 99% 100% 100% 100%    Constitutional: NAD, calm, comfortable Vitals:   02/06/21 1530 02/06/21 1545 02/06/21 1600 02/06/21 1615  BP: 120/67 (!) 116/56 130/69 124/76  Pulse: 77 78 80 73  Resp: 14 20  16   Temp:      SpO2: 99% 100% 100% 100%   Eyes: PERRL, lids and conjunctivae yellow ENMT: Mucous membranes are moist. Posterior pharynx clear of any exudate or lesions.Normal dentition.  Neck: normal, supple, no masses, no thyromegaly Respiratory: clear to auscultation bilaterally, no wheezing, no crackles. Normal respiratory effort. No accessory muscle use.  Cardiovascular: Regular rate and rhythm, no murmurs / rubs / gallops.  Minimum extremity edema. 2+ pedal pulses. No carotid bruits.  Abdomen: Distended, positive ascites sign, no masses palpated. No hepatosplenomegaly. Bowel sounds positive.  Musculoskeletal: no clubbing / cyanosis. No joint deformity upper and lower extremities. Good ROM, no contractures. Normal muscle tone.  Skin: no rashes, lesions, ulcers. No induration Neurologic: CN 2-12 grossly intact. Sensation intact, DTR normal. Strength 5/5 in all 4.  Psychiatric: Normal judgment and insight. Alert and oriented x 3. Normal mood.     Labs on Admission: I have personally reviewed following labs and imaging studies  CBC: Recent Labs  Lab 02/04/21 1557 02/06/21 1341  WBC 11.0* 9.4  HGB 13.0 12.6*  HCT 34.5* 35.0*  MCV 91 95.4  PLT 93* PLATELET CLUMPS NOTED ON SMEAR, COUNT APPEARS DECREASED   Basic Metabolic Panel: Recent Labs  Lab 02/04/21 1557 02/06/21 1341  NA 118* 118*  K 5.1 4.5  CL 86* 90*  CO2 17* 21*  GLUCOSE 115* 108*  BUN 10 14  CREATININE 0.82 1.06  CALCIUM 8.1* 7.9*  MG 1.6  --    GFR: Estimated Creatinine Clearance: 89.6 mL/min (by C-G formula based on SCr of 1.06 mg/dL). Liver Function Tests: Recent Labs  Lab 02/04/21 1557 02/06/21 1341  AST 181* 168*  ALT 61* 61*   ALKPHOS 420* 324*  BILITOT 13.5* 13.6*  PROT 5.6* 5.6*  ALBUMIN 2.7* 1.8*   Recent Labs  Lab 02/06/21 1341  LIPASE 61*   Recent Labs  Lab 02/06/21 1535  AMMONIA 71*   Coagulation Profile: No results for input(s): INR, PROTIME in the last 168 hours. Cardiac Enzymes: No results for input(s): CKTOTAL, CKMB, CKMBINDEX, TROPONINI in the last 168 hours. BNP (last 3 results) No results for input(s): PROBNP in the last 8760 hours. HbA1C: Recent Labs    02/04/21 1557  HGBA1C 4.3*   CBG: No results for input(s): GLUCAP in the last 168 hours. Lipid Profile: No results for input(s): CHOL, HDL, LDLCALC, TRIG, CHOLHDL, LDLDIRECT in the last 72 hours.  Thyroid Function Tests: No results for input(s): TSH, T4TOTAL, FREET4, T3FREE, THYROIDAB in the last 72 hours. Anemia Panel: No results for input(s): VITAMINB12, FOLATE, FERRITIN, TIBC, IRON, RETICCTPCT in the last 72 hours. Urine analysis:    Component Value Date/Time   COLORURINE AMBER (A) 02/06/2021 1524   APPEARANCEUR HAZY (A) 02/06/2021 1524   APPEARANCEUR Cloudy (A) 01/14/2021 1630   LABSPEC 1.015 02/06/2021 1524   PHURINE 5.0 02/06/2021 1524   GLUCOSEU NEGATIVE 02/06/2021 1524   HGBUR MODERATE (A) 02/06/2021 1524   BILIRUBINUR MODERATE (A) 02/06/2021 1524   BILIRUBINUR Negative 01/14/2021 [place] 02/06/2021 1524   PROTEINUR NEGATIVE 02/06/2021 1524   UROBILINOGEN 1.0 10/17/2020 1627   NITRITE NEGATIVE 02/06/2021 1524   LEUKOCYTESUR NEGATIVE 02/06/2021 1524    Radiological Exams on Admission: No results found.  EKG: Independently reviewed. Sinus, no acute ST changes  Assessment/Plan Active Problems:   Hyponatremia  (please populate well all problems here in Problem List. (For example, if patient is on BP meds at home and you resume or decide to hold them, it is a problem that needs to be her. Same for CAD, COPD, HLD and so on)  Hyponatremia, euvolemic -Likely dilutional from excessive water  intake -Educated patient at bedside, for him reasonable oral intake including everything to be around 2000 mL/day.  And patient agreed. -Bicarb pill to address both hyponatremia and nongap metabolic acidosis from diarrhea (Lactulose related) -BMP BID x2 days, expect correction less than 0.5 mEq/h and expect correction of hyponatremia in 2 days.  Dyspnea on exertion -Suspect restrictive lung issue due to large ascites, will check xray.  Chronic hepatic failure -Chronic elevation of LFTs and bilirubinemia, AST ALT and bili level stable trending to recent month results. -MELD score=29 today slight worsening than last month's 25. -To see hematology on [facility]. Next week. -Continue current lactulose, Lasix and Aldactone regimen.  Hyperalbuminemia -Worsening of liver synthetic function -Albumin infusion Q6H x2 days  Ascites -Consult IR for paracentesis tomorrow.  Elevated ammonia -No S/S of encephalopathy, continue current lactulose regimen  Non-anion gap Metabolic acidosis -From lactulose induced diarrhea, start bicarb p.o.    DVT prophylaxis: INR elevated Code Status: Full Code Family Communication: None at bedside Disposition Plan: Expect 2 days hospital stay to correct sodium level gradually. Consults called: None Admission status: Tele admit  Lequita Halt MD [facility] Pager [phone]  02/06/2021, 4:59 PM

## 2021-02-07 ENCOUNTER — Inpatient Hospital Stay (HOSPITAL_COMMUNITY): Payer: No Typology Code available for payment source

## 2021-02-07 ENCOUNTER — Other Ambulatory Visit: Payer: Self-pay

## 2021-02-07 ENCOUNTER — Encounter (HOSPITAL_COMMUNITY): Payer: Self-pay | Admitting: Internal Medicine

## 2021-02-07 DIAGNOSIS — K7031 Alcoholic cirrhosis of liver with ascites: Secondary | ICD-10-CM

## 2021-02-07 DIAGNOSIS — R188 Other ascites: Secondary | ICD-10-CM

## 2021-02-07 LAB — BASIC METABOLIC PANEL
Anion gap: 8 (ref 5–15)
Anion gap: 7 (ref 5–15)
BUN: 14 mg/dL (ref 6–20)
BUN: 12 mg/dL (ref 6–20)
CO2: 20 mmol/L — ABNORMAL LOW (ref 22–32)
CO2: 19 mmol/L — ABNORMAL LOW (ref 22–32)
Calcium: 8.3 mg/dL — ABNORMAL LOW (ref 8.9–10.3)
Calcium: 8.5 mg/dL — ABNORMAL LOW (ref 8.9–10.3)
Chloride: 93 mmol/L — ABNORMAL LOW (ref 98–111)
Chloride: 98 mmol/L (ref 98–111)
Creatinine, Ser: 0.75 mg/dL (ref 0.61–1.24)
Creatinine, Ser: 0.79 mg/dL (ref 0.61–1.24)
GFR, Estimated: >60 mL/min (ref >60)
GFR, Estimated: >60 mL/min (ref >60)
Glucose, Bld: 92 mg/dL (ref 70–99)
Glucose, Bld: 141 mg/dL — ABNORMAL HIGH (ref 70–99)
Potassium: 4.6 mmol/L (ref 3.5–5.1)
Potassium: 4.3 mmol/L (ref 3.5–5.1)
Sodium: 121 mmol/L — ABNORMAL LOW (ref 135–145)
Sodium: 124 mmol/L — ABNORMAL LOW (ref 135–145)

## 2021-02-07 LAB — BODY FLUID CELL COUNT WITH DIFFERENTIAL
Eos, Fluid: 0 %
Lymphs, Fluid: 44 %
Monocyte-Macrophage-Serous Fluid: 23 % — ABNORMAL LOW (ref 50–90)
Neutrophil Count, Fluid: 33 % — ABNORMAL HIGH (ref 0–25)
Total Nucleated Cell Count, Fluid: 482 cu mm (ref 0–1000)

## 2021-02-07 LAB — PROTIME-INR
INR: 1.7 — ABNORMAL HIGH (ref 0.8–1.2)
Prothrombin Time: 19.4 seconds — ABNORMAL HIGH (ref 11.4–15.2)

## 2021-02-07 MED ORDER — ZINC SULFATE 220 (50 ZN) MG PO CAPS
220.0000 mg | ORAL_CAPSULE | Freq: Every day | ORAL | Status: DC
  Administered 2021-02-07 – 2021-02-12 (×6): 220 mg via ORAL
  Filled 2021-02-07 (×6): qty 1

## 2021-02-07 MED ORDER — SODIUM CHLORIDE 0.9 % IV SOLN
200.0000 mg | Freq: Once | INTRAVENOUS | Status: AC
  Administered 2021-02-07: 200 mg via INTRAVENOUS
  Filled 2021-02-07: qty 40

## 2021-02-07 MED ORDER — SODIUM CHLORIDE 0.9 % IV SOLN: 2.0000 g | INTRAVENOUS | Status: DC

## 2021-02-07 MED ORDER — ASCORBIC ACID 500 MG PO TABS
500.0000 mg | ORAL_TABLET | Freq: Every day | ORAL | Status: DC
  Administered 2021-02-07 – 2021-02-12 (×6): 500 mg via ORAL
  Filled 2021-02-07 (×6): qty 1

## 2021-02-07 MED ORDER — LIDOCAINE HCL (PF) 1 % IJ SOLN
INTRAMUSCULAR | Status: AC
  Filled 2021-02-07: qty 30

## 2021-02-07 MED ORDER — SODIUM CHLORIDE 0.9 % IV SOLN
100.0000 mg | Freq: Every day | INTRAVENOUS | Status: AC
  Administered 2021-02-08 – 2021-02-09 (×2): 100 mg via INTRAVENOUS
  Filled 2021-02-07 (×2): qty 20

## 2021-02-07 MED ORDER — GUAIFENESIN-DM 100-10 MG/5ML PO SYRP: 10.0000 mL | ORAL_SOLUTION | ORAL | Status: DC | PRN

## 2021-02-07 MED ORDER — ALBUTEROL SULFATE HFA 108 (90 BASE) MCG/ACT IN AERS: 2.0000 | INHALATION_SPRAY | RESPIRATORY_TRACT | Status: DC | PRN

## 2021-02-07 NOTE — Progress Notes (Addendum)
PROGRESS NOTE                                                                             PROGRESS NOTE                                                                                                                                                                                                             Patient Demographics:    Ian Hall, is a 55 y.o. male, DOB - [birth year], KCM:[id number]  Outpatient Primary MD for the patient is Maximiano Coss, NP    LOS - 1  Admit date - 02/06/2021    Chief Complaint  Patient presents with  . Abnormal Lab       Brief Narrative     HPI: Ian Hall is a 55 y.o. 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  Patient has been feeling generalized weakness and exertional dyspnea for the last 3 to 4 weeks.  He has been losing weight, from 222 lbs 2 months ago to 219 lbs today.  He has been compliant with his versus medication including Lasix and Aldactone and lactulose.  He underwent to paracentesis every 2 to 3 weeks, and last paracentesis was 3 weeks ago.  He is used able to walk 20 to 30 minutes without any problem but lately he found he started to feel shortness of breath after less than 10 minutes walk, denied any cough, no chest pains.  No fever chills.  Today she went to see PCP who did lab work in the office showed sodium 118.  Patient reported that he drinks 4 bottles of 500 mL water every day, in addition he eats 5-6 servings of fruits and 2 serves of salad and oatmeal with milk, so I estimate his daily free water with everything including is ~3000 ml. He does enforcing 2 gram salt restriction.   Subjective:    Ian Hall today denies any dyspnea, chest pain,  abdominal pain, but he reports generalized weakness.     Assessment  & Plan :    Active Problems:   Hyponatremia     Hyponatremia, euvolemic -Likely dilutional from excessive water intake -Educated patient at bedside, limited free water intake to 2 L/day .  covid  19 infection -Patient is vaccinated, but not boosted, no hypoxia, will treat with 3 days of IV Remdesivir.  Liver cirrhosis related to [place]/alcohol abuse -No alcohol consumption since December -Appears to be compensated with mild ascites. -Continue with home dose Lasix and Aldactone. -With ascites, 2.6 L drained.  482 total cells, 33% segmented, no evidence of SBP. -Hyperammonemia with elevated ammonia level at 71, continue with lactulose, no evidence of encephalopathy -Evidence of coagulopathy and elevated INR, will give p.o. vitamin K x1 -Continue with 2 g sodium diet, -Continue with rifaximin. -Trend LFTs.   Hyperalbuminemia -Due to liver cirrhosis, on IV albumin especially he is receiving paracentesis.    Elevated ammonia -No S/S of encephalopathy, continue current lactulose regimen  Non-anion gap Metabolic acidosis -From lactulose induced diarrhea, start bicarb p.o.   SpO2: 100 %  Recent Labs  Lab 02/04/21 1557 02/06/21 1341 02/06/21 1612 02/06/21 1743 02/07/21 0222  WBC 11.0* 9.4  --   --   --   PLT 93* PLATELET CLUMPS NOTED ON SMEAR, COUNT APPEARS DECREASED  --   --   --   AST 181* 168*  --   --   --   ALT 61* 61*  --   --   --   ALKPHOS 420* 324*  --   --   --   BILITOT 13.5* 13.6*  --   --   --   ALBUMIN 2.7* 1.8*  --   --   --   INR  --   --   --  1.8* 1.7*  SARSCOV2NAA  --   --  POSITIVE*  --   --        ABG  No results found for: PHART, PCO2ART, PO2ART, HCO3, TCO2, ACIDBASEDEF, O2SAT       Condition - Extremely Guarded  Family Communication  : None at bedside  Code Status : Full  Consults  : Gastroenterology  Procedures  : Ultrasound paracentesis 3/12   Disposition Plan  :    Status is: Inpatient  Remains inpatient appropriate because:IV treatments appropriate due to intensity of  illness or inability to take PO   Dispo: The patient is from: Home              Anticipated d/c is to: Home              Patient currently is not medically stable to d/c.   Difficult to place patient No      DVT Prophylaxis  :  SCDs   Lab Results  Component Value Date   PLT  02/06/2021    PLATELET CLUMPS NOTED ON SMEAR, COUNT APPEARS DECREASED    Diet :  Diet Order            Diet regular Room service appropriate? Yes; Fluid consistency: Thin; Fluid restriction: 1800 mL Fluid  Diet effective now                  Inpatient Medications  Scheduled Meds: . vitamin C  500 mg Oral Daily  . dicyclomine  10 mg Oral TID AC & HS  . furosemide  40 mg Oral Daily  . lactulose  30 g Oral TID  .  lidocaine (PF)      . rifaximin  550 mg Oral BID  . sodium bicarbonate  650 mg Oral BID  . spironolactone  100 mg Oral Daily  . zinc sulfate  220 mg Oral Daily   Continuous Infusions: . albumin human Stopped (02/07/21 0700)  . albumin human    . [START ON 02/08/2021] remdesivir 100 mg in NS 100 mL     PRN Meds:.albuterol, guaiFENesin-dextromethorphan, hydrOXYzine  Antibiotics  :    Anti-infectives (From admission, onward)   Start     Dose/Rate Route Frequency Ordered Stop   02/08/21 1000  remdesivir 100 mg in sodium chloride 0.9 % 100 mL IVPB       "Followed by" Linked Group Details   100 mg 200 mL/hr over 30 Minutes Intravenous Daily 02/07/21 0058 02/10/21 0959   02/07/21 0130  remdesivir 200 mg in sodium chloride 0.9% 250 mL IVPB       "Followed by" Linked Group Details   200 mg 580 mL/hr over 30 Minutes Intravenous Once 02/07/21 0058 02/07/21 0421   02/06/21 2200  rifaximin (XIFAXAN) tablet 550 mg        550 mg Oral 2 times daily 02/06/21 1653          Meigan Pates M.D on 02/07/2021 at 11:36 AM  To page go to [email or web address]   [facility] -  Office  [phone]      Objective:   Vitals:   02/07/21 0925 02/07/21 0930 02/07/21 0935 02/07/21 1000  BP:  118/72 112/68 116/65 131/79  Pulse:    70  Resp:    12  Temp:    97.9 F (36.6 C)  TempSrc:    Oral  SpO2:    100%    Wt Readings from Last 3 Encounters:  02/04/21 99.7 kg  01/29/21 100 kg  01/28/21 99.8 kg     Intake/Output Summary (Last 24 hours) at 02/07/2021 1136 Last data filed at 02/07/2021 6226 Gross per 24 hour  Intake --  Output 400 ml  Net -400 ml     Physical Exam  Awake Alert, No new F.N deficits, Normal affect Symmetrical Chest wall movement, Good air movement bilaterally, CTAB RRR,No Gallops,Rubs or new Murmurs, No Parasternal Heave +ve B.Sounds, Abdomen mildly  distended with ascites,No rebound - guarding or rigidity. No Cyanosis, Clubbing or edema, No new Rash or bruise      Data Review:    CBC Recent Labs  Lab 02/04/21 1557 02/06/21 1341  WBC 11.0* 9.4  HGB 13.0 12.6*  HCT 34.5* 35.0*  PLT 93* PLATELET CLUMPS NOTED ON SMEAR, COUNT APPEARS DECREASED  MCV 91 95.4  MCH 34.1* 34.3*  MCHC 37.7* 36.0  RDW 14.3 14.8    Recent Labs  Lab 02/04/21 1557 02/06/21 1341 02/06/21 1535 02/06/21 1743 02/07/21 0222 02/07/21 0858  NA 118* 118*  --   --  121* 124*  K 5.1 4.5  --   --  4.6 4.3  CL 86* 90*  --   --  93* 98  CO2 17* 21*  --   --  20* 19*  GLUCOSE 115* 108*  --   --  92 141*  BUN 10 14  --   --  14 12  CREATININE 0.82 1.06  --   --  0.75 0.79  CALCIUM 8.1* 7.9*  --   --  8.3* 8.5*  AST 181* 168*  --   --   --   --   ALT 61* 61*  --   --   --   --  ALKPHOS 420* 324*  --   --   --   --   BILITOT 13.5* 13.6*  --   --   --   --   ALBUMIN 2.7* 1.8*  --   --   --   --   MG 1.6  --   --   --   --   --   INR  --   --   --  1.8* 1.7*  --   HGBA1C 4.3*  --   --   --   --   --   AMMONIA  --   --  71*  --   --   --     ------------------------------------------------------------------------------------------------------------------ No results for input(s): CHOL, HDL, LDLCALC, TRIG, CHOLHDL, LDLDIRECT in the last 72 hours.  Lab Results   Component Value Date   HGBA1C 4.3 (L) 02/04/2021   ------------------------------------------------------------------------------------------------------------------ No results for input(s): TSH, T4TOTAL, T3FREE, THYROIDAB in the last 72 hours.  Invalid input(s): FREET3  Cardiac Enzymes No results for input(s): CKMB, TROPONINI, MYOGLOBIN in the last 168 hours.  Invalid input(s): CK ------------------------------------------------------------------------------------------------------------------ No results found for: BNP  Micro Results Recent Results (from the past 240 hour(s))  Resp Panel by RT-PCR (Flu A&B, Covid) Nasopharyngeal Swab     Status: Abnormal   Collection Time: 02/06/21  4:12 PM   Specimen: Nasopharyngeal Swab; Nasopharyngeal(NP) swabs in vial transport medium  Result Value Ref Range Status   SARS Coronavirus 2 by RT PCR POSITIVE (A) NEGATIVE Final    Comment: RESULT CALLED TO, READ BACK BY AND VERIFIED WITH: Rick Duff RN 2024 02/06/21 A BROWNING (NOTE) SARS-CoV-2 target nucleic acids are DETECTED.  The SARS-CoV-2 RNA is generally detectable in upper respiratory specimens during the acute phase of infection. Positive results are indicative of the presence of the identified virus, but do not rule out bacterial infection or co-infection with other pathogens not detected by the test. Clinical correlation with patient history and other diagnostic information is necessary to determine patient infection status. The expected result is Negative.  Fact Sheet for Patients: EntrepreneurPulse.com.au  Fact Sheet for Healthcare Providers: IncredibleEmployment.be  This test is not yet approved or cleared by the [place] FDA and  has been authorized for detection and/or diagnosis of SARS-CoV-2 by FDA under an Emergency Use Authorization (EUA).  This EUA will remain in effect (meaning this test can b e used) for the duration of  the  COVID-19 declaration under Section 564(b)(1) of the Act, 21 U.S.C. section 360bbb-3(b)(1), unless the authorization is terminated or revoked sooner.     Influenza A by PCR NEGATIVE NEGATIVE Final   Influenza B by PCR NEGATIVE NEGATIVE Final    Comment: (NOTE) The Xpert Xpress SARS-CoV-2/FLU/RSV plus assay is intended as an aid in the diagnosis of influenza from Nasopharyngeal swab specimens and should not be used as a sole basis for treatment. Nasal washings and aspirates are unacceptable for Xpert Xpress SARS-CoV-2/FLU/RSV testing.  Fact Sheet for Patients: EntrepreneurPulse.com.au  Fact Sheet for Healthcare Providers: IncredibleEmployment.be  This test is not yet approved or cleared by the [place] FDA and has been authorized for detection and/or diagnosis of SARS-CoV-2 by FDA under an Emergency Use Authorization (EUA). This EUA will remain in effect (meaning this test can be used) for the duration of the COVID-19 declaration under Section 564(b)(1) of the Act, 21 U.S.C. section 360bbb-3(b)(1), unless the authorization is terminated or revoked.  Performed at [facility], [place] [address], [place], [place] [address]  Radiology Reports DG Chest 1 View  Result Date: 02/06/2021 CLINICAL DATA:  Dyspnea. EXAM: CHEST  1 VIEW COMPARISON:  12/26/2020 FINDINGS: Lower lung volumes from prior exam with mild bronchovascular crowding. Stable heart size and mediastinal contours. Streaky opacities at the left lung base favor atelectasis. Hazy opacity in the right costophrenic angle likely represents small effusion. No pneumothorax. Overlying monitoring devices project over the right chest. IMPRESSION: 1. Lower lung volumes from prior exam with bronchovascular crowding. 2. Hazy opacity in the right costophrenic angle likely small effusion. 3. Streaky left basilar opacities favor atelectasis. Electronically Signed   By: Keith Rake M.D.   On:  02/06/2021 17:43   IR Venogram Hepatic W Hemodynamic Evaluation  Result Date: 01/09/2021 CLINICAL DATA:  Elevated liver function tests. Transjugular liver biopsy requested given thrombocytopenia and ascites. EXAM: Transjugular hepatic venogram, pressure measurements, and biopsy MEDICATIONS: MEDICATIONS None. ANESTHESIA/SEDATION: None COMPLICATIONS: None immediate. PROCEDURE: Informed written consent was obtained from the patient after a thorough discussion of the procedural risks, benefits and alternatives. All questions were addressed. Maximal Sterile Barrier Technique was utilized including caps, mask, sterile gowns, sterile gloves, sterile drape, hand hygiene and skin antiseptic. A timeout was performed prior to the initiation of the procedure. Patient positioned supine on the procedure table. Right neck skin was prepped and draped in usual sterile fashion. Sterile ultrasound probe cover and sterile gel utilized throughout the procedure. Ultrasound image documenting patency of the right internal jugular vein was obtained and placed in per medical record. Subcutaneous lidocaine administered for local anesthetic. Right internal jugular vein was accessed with a 21 gauge needle under continuous ultrasound guidance. 21 gauge needle over 0.018 inch guidewire and access upsized to a transitional dilator set. Transitional dilator set removed over 0.035 inch guidewire and 10 French 25 cm sheath inserted to the level of the inferior vena cava under fluoroscopic guidance. The middle hepatic vein was selected with Sos Omni catheter and glidewire. Venogram was performed to confirm appropriate positioning within the middle hepatic vein. Sos omni catheter was removed over Amplatz guidewire and the 10 French sheath was advanced into the middle hepatic vein using the introducer. Four 18 gauge cores were obtained from the right hepatic lobe using fluoroscopic guidance. All samples were sent to pathology in formalin. Pull-back  pressure measurements were performed: Wedged portal vein: 30 mm Hg Hepatic vein: 15 mm Hg IVC: 13 mm Hg Right atrium: 30 mm Hg Portosystemic gradient: 17 mm Hg Right IJ sheath was removed and hemostasis achieved with manual compression. IMPRESSION: 1. Transjugular liver biopsy as above. 2. Portosystemic gradient of 17 mm Hg consistent with portal hypertension. Electronically Signed   By: Miachel Roux M.D.   On: 01/09/2021 18:00   IR Transcatheter BX  Result Date: 01/09/2021 CLINICAL DATA:  Elevated liver function tests. Transjugular liver biopsy requested given thrombocytopenia and ascites. EXAM: Transjugular hepatic venogram, pressure measurements, and biopsy MEDICATIONS: MEDICATIONS None. ANESTHESIA/SEDATION: None COMPLICATIONS: None immediate. PROCEDURE: Informed written consent was obtained from the patient after a thorough discussion of the procedural risks, benefits and alternatives. All questions were addressed. Maximal Sterile Barrier Technique was utilized including caps, mask, sterile gowns, sterile gloves, sterile drape, hand hygiene and skin antiseptic. A timeout was performed prior to the initiation of the procedure. Patient positioned supine on the procedure table. Right neck skin was prepped and draped in usual sterile fashion. Sterile ultrasound probe cover and sterile gel utilized throughout the procedure. Ultrasound image documenting patency of the right internal jugular vein was obtained and  placed in per medical record. Subcutaneous lidocaine administered for local anesthetic. Right internal jugular vein was accessed with a 21 gauge needle under continuous ultrasound guidance. 21 gauge needle over 0.018 inch guidewire and access upsized to a transitional dilator set. Transitional dilator set removed over 0.035 inch guidewire and 10 French 25 cm sheath inserted to the level of the inferior vena cava under fluoroscopic guidance. The middle hepatic vein was selected with Sos Omni catheter and  glidewire. Venogram was performed to confirm appropriate positioning within the middle hepatic vein. Sos omni catheter was removed over Amplatz guidewire and the 10 French sheath was advanced into the middle hepatic vein using the introducer. Four 18 gauge cores were obtained from the right hepatic lobe using fluoroscopic guidance. All samples were sent to pathology in formalin. Pull-back pressure measurements were performed: Wedged portal vein: 30 mm Hg Hepatic vein: 15 mm Hg IVC: 13 mm Hg Right atrium: 30 mm Hg Portosystemic gradient: 17 mm Hg Right IJ sheath was removed and hemostasis achieved with manual compression. IMPRESSION: 1. Transjugular liver biopsy as above. 2. Portosystemic gradient of 17 mm Hg consistent with portal hypertension. Electronically Signed   By: Miachel Roux M.D.   On: 01/09/2021 18:00   US Paracentesis  Result Date: 02/07/2021 INDICATION: History of cirrhosis. Recurrent ascites. Request for diagnostic and therapeutic paracentesis. EXAM: ULTRASOUND GUIDED RIGHT LOWER QUADRANT PARACENTESIS MEDICATIONS: None. COMPLICATIONS: None immediate. PROCEDURE: Informed written consent was obtained from the patient after a discussion of the risks, benefits and alternatives to treatment. A timeout was performed prior to the initiation of the procedure. Initial ultrasound scanning demonstrates a large amount of ascites within the right lower abdominal quadrant. The right lower abdomen was prepped and draped in the usual sterile fashion. 1% lidocaine was used for local anesthesia. Following this, a 19 gauge, 7-cm, Yueh catheter was introduced. An ultrasound image was saved for documentation purposes. The paracentesis was performed. The catheter was removed and a dressing was applied. The patient tolerated the procedure well without immediate post procedural complication. FINDINGS: A total of approximately 2.6 L of clear yellow fluid was removed. Samples were sent to the laboratory as requested by the  clinical team. IMPRESSION: Successful ultrasound-guided paracentesis yielding 2.6 liters of peritoneal fluid. Read by: Ascencion Dike PA-C Electronically Signed   By: Jacqulynn Cadet M.D.   On: 02/07/2021 10:00   IR US Guide Vasc Access Right  Result Date: 01/13/2021 CLINICAL DATA:  Elevated liver function tests. Transjugular liver biopsy requested given thrombocytopenia and ascites.  EXAM: Transjugular hepatic venogram, pressure measurements, and biopsy  MEDICATIONS: MEDICATIONS None.  ANESTHESIA/SEDATION: None  COMPLICATIONS: None immediate.  PROCEDURE: Informed written consent was obtained from the patient after a thorough discussion of the procedural risks, benefits and alternatives. All questions were addressed. Maximal Sterile Barrier Technique was utilized including caps, mask, sterile gowns, sterile gloves, sterile drape, hand hygiene and skin antiseptic. A timeout was performed prior to the initiation of the procedure.  Patient positioned supine on the procedure table. Right neck skin was prepped and draped in usual sterile fashion. Sterile ultrasound probe cover and sterile gel utilized throughout the procedure.  Ultrasound image documenting patency of the right internal jugular vein was obtained and placed in per medical record. Subcutaneous lidocaine administered for local anesthetic. Right internal jugular vein was accessed with a 21 gauge needle under continuous ultrasound guidance. 21 gauge needle over 0.018 inch guidewire and access upsized to a transitional dilator set.  Transitional dilator set removed over 0.035  inch guidewire and 10 French 25 cm sheath inserted to the level of the inferior vena cava under fluoroscopic guidance. The middle hepatic vein was selected with Sos Omni catheter and glidewire.  Venogram was performed to confirm appropriate positioning within the middle hepatic vein. Sos omni catheter was removed over Amplatz guidewire and the 10 French sheath was advanced  into the middle hepatic vein using the introducer.  Four 18 gauge cores were obtained from the right hepatic lobe using fluoroscopic guidance. All samples were sent to pathology in formalin.  Pull-back pressure measurements were performed:  Wedged portal vein: 30 mm Hg  Hepatic vein: 15 mm Hg  IVC: 13 mm Hg  Right atrium: 30 mm Hg  Portosystemic gradient: 17 mm Hg  Right IJ sheath was removed and hemostasis achieved with manual compression.  IMPRESSION: 1. Transjugular liver biopsy as above. 2. Portosystemic gradient of 17 mm Hg consistent with portal hypertension.   Electronically Signed   By: Miachel Roux M.D.   On: 01/09/2021 18:00   IR Paracentesis  Result Date: 01/30/2021 INDICATION: Patient with history of alcoholic cirrhosis, recurrent ascites. Request is made for diagnostic and therapeutic paracentesis. EXAM: ULTRASOUND GUIDED  PARACENTESIS MEDICATIONS: 10 mL 1 % lidocaine COMPLICATIONS: None immediate. PROCEDURE: Informed written consent was obtained from the patient after a discussion of the risks, benefits and alternatives to treatment. A timeout was performed prior to the initiation of the procedure. Initial ultrasound scanning demonstrates a large amount of ascites within the right lower abdominal quadrant. The right lower abdomen was prepped and draped in the usual sterile fashion. 1% lidocaine was used for local anesthesia. Following this, a 19 gauge, 7-cm, Yueh catheter was introduced. An ultrasound image was saved for documentation purposes. The paracentesis was performed. The catheter was removed and a dressing was applied. The patient tolerated the procedure well without immediate post procedural complication. FINDINGS: A total of approximately 2.4 L of clear yellow fluid was removed. IMPRESSION: Successful ultrasound-guided paracentesis yielding 2.4 liters of peritoneal fluid. Read by: Durenda Guthrie, PA-C Electronically Signed   By: Lucrezia Europe M.D.   On: 01/30/2021 14:14

## 2021-02-07 NOTE — ED Notes (Addendum)
Phone report called to [employer], all questions answered. Transport ordered.

## 2021-02-07 NOTE — Progress Notes (Addendum)
HOSPITAL MEDICINE OVERNIGHT EVENT NOTE    Notified by nursing that patient's Covid testing has come back positive.    Chart reviewed, charge of the patient has been complaining of some exertional dyspnea as of late although admitting provider thought this may be secondary to severe ascites.  Patient is currently 100% on room air.    I would say at this point this diagnosis is incidental and therefore will order a 3-day course of remdesivir.  Initially ordering as needed bronchodilator therapy, as needed antitussives and Daily zinc and vitamin C. Airborne and contact isolation for now.  Ian Emerald  MD [facility]

## 2021-02-07 NOTE — ED Notes (Signed)
Patient transported to Ultrasound 

## 2021-02-07 NOTE — ED Notes (Signed)
Patient is A/A/O, talking to family on phone, questions regarding paracentesis answered, remains on continuous cardiac and 02 sat monitoring, no distress.

## 2021-02-07 NOTE — Procedures (Signed)
PROCEDURE SUMMARY:  Successful US guided paracentesis from RLQ.  Yielded 2.6 L of clear yellow fluid.  No immediate complications.  Pt tolerated well.   Specimen was sent for labs.  EBL < 33m  KAscencion DikePA-C 02/07/2021 9:58 AM

## 2021-02-07 NOTE — ED Notes (Signed)
RN messaged pharmacy per due remdesivir

## 2021-02-08 ENCOUNTER — Inpatient Hospital Stay (HOSPITAL_COMMUNITY): Payer: No Typology Code available for payment source

## 2021-02-08 DIAGNOSIS — U071 COVID-19: Secondary | ICD-10-CM | POA: Diagnosis not present

## 2021-02-08 LAB — CBC WITH DIFFERENTIAL/PLATELET
Abs Immature Granulocytes: 0.05 10*3/uL (ref 0.00–0.07)
Basophils Absolute: 0.1 10*3/uL (ref 0.0–0.1)
Basophils Relative: 1 %
Eosinophils Absolute: 0.1 10*3/uL (ref 0.0–0.5)
Eosinophils Relative: 2 %
HCT: 31.6 % — ABNORMAL LOW (ref 39.0–52.0)
Hemoglobin: 11.6 g/dL — ABNORMAL LOW (ref 13.0–17.0)
Immature Granulocytes: 1 %
Lymphocytes Relative: 19 %
Lymphs Abs: 1.1 10*3/uL (ref 0.7–4.0)
MCH: 35.4 pg — ABNORMAL HIGH (ref 26.0–34.0)
MCHC: 36.7 g/dL — ABNORMAL HIGH (ref 30.0–36.0)
MCV: 96.3 fL (ref 80.0–100.0)
Monocytes Absolute: 0.8 10*3/uL (ref 0.1–1.0)
Monocytes Relative: 13 %
Neutro Abs: 3.8 10*3/uL (ref 1.7–7.7)
Neutrophils Relative %: 64 %
Platelets: 69 10*3/uL — ABNORMAL LOW (ref 150–400)
RBC: 3.28 MIL/uL — ABNORMAL LOW (ref 4.22–5.81)
RDW: 15.5 % (ref 11.5–15.5)
WBC: 5.8 10*3/uL (ref 4.0–10.5)
nRBC: 0 % (ref 0.0–0.2)

## 2021-02-08 LAB — COMPREHENSIVE METABOLIC PANEL
ALT: 44 U/L (ref 0–44)
AST: 124 U/L — ABNORMAL HIGH (ref 15–41)
Albumin: 2.5 g/dL — ABNORMAL LOW (ref 3.5–5.0)
Alkaline Phosphatase: 262 U/L — ABNORMAL HIGH (ref 38–126)
Anion gap: 8 (ref 5–15)
BUN: 14 mg/dL (ref 6–20)
CO2: 19 mmol/L — ABNORMAL LOW (ref 22–32)
Calcium: 8.6 mg/dL — ABNORMAL LOW (ref 8.9–10.3)
Chloride: 101 mmol/L (ref 98–111)
Creatinine, Ser: 0.84 mg/dL (ref 0.61–1.24)
GFR, Estimated: >60 mL/min (ref >60)
Glucose, Bld: 87 mg/dL (ref 70–99)
Potassium: 4.7 mmol/L (ref 3.5–5.1)
Sodium: 128 mmol/L — ABNORMAL LOW (ref 135–145)
Total Bilirubin: 11.8 mg/dL — ABNORMAL HIGH (ref 0.3–1.2)
Total Protein: 5.4 g/dL — ABNORMAL LOW (ref 6.5–8.1)

## 2021-02-08 LAB — GRAM STAIN

## 2021-02-08 LAB — MAGNESIUM: Magnesium: 1.7 mg/dL (ref 1.7–2.4)

## 2021-02-08 LAB — D-DIMER, QUANTITATIVE: D-Dimer, Quant: 8.41 ug/mL-FEU — ABNORMAL HIGH (ref 0.00–0.50)

## 2021-02-08 MED ORDER — APIXABAN 5 MG PO TABS: 10.0000 mg | ORAL_TABLET | Freq: Two times a day (BID) | ORAL | Status: DC

## 2021-02-08 MED ORDER — WARFARIN SODIUM 5 MG PO TABS
5.0000 mg | ORAL_TABLET | Freq: Once | ORAL | Status: AC
  Administered 2021-02-08: 5 mg via ORAL
  Filled 2021-02-08: qty 1

## 2021-02-08 MED ORDER — WARFARIN - PHARMACIST DOSING INPATIENT: Freq: Every day | Status: DC

## 2021-02-08 MED ORDER — APIXABAN 5 MG PO TABS: 5.0000 mg | ORAL_TABLET | Freq: Two times a day (BID) | ORAL | Status: DC

## 2021-02-08 MED ORDER — ENOXAPARIN SODIUM 100 MG/ML ~~LOC~~ SOLN
95.0000 mg | Freq: Once | SUBCUTANEOUS | Status: AC
  Administered 2021-02-08: 95 mg via SUBCUTANEOUS
  Filled 2021-02-08: qty 1

## 2021-02-08 MED ORDER — ENOXAPARIN SODIUM 100 MG/ML ~~LOC~~ SOLN
95.0000 mg | Freq: Two times a day (BID) | SUBCUTANEOUS | Status: DC
  Administered 2021-02-09: 95 mg via SUBCUTANEOUS
  Filled 2021-02-08: qty 1

## 2021-02-08 NOTE — Progress Notes (Addendum)
ANTICOAGULATION CONSULT NOTE - Initial Consult  Pharmacy Consult for Warfarin + Lovenox Bridging Indication: DVT  Allergies  Allergen Reactions  . Hydrocodone Itching and Other (See Comments)    Noted with hydrocodone cough syrup. Itching only, no hives.  02/03/16: denied itching with recent hydrocodone cough syrup. Only one episode of itching previously.     Patient Measurements: Height: 5' 5"  (165.1 cm) Weight: 93.3 kg (205 lb 9.6 oz) IBW/kg (Calculated) : 61.5  Vital Signs: Temp: 98.3 F (36.8 C) (03/13 0917) Temp Source: Oral (03/13 0917) BP: 124/69 (03/13 1339) Pulse Rate: 92 (03/13 0408)  Labs: Recent Labs    02/06/21 1341 02/06/21 1743 02/07/21 0222 02/07/21 0858 02/08/21 0412  HGB 12.6*  --   --   --  11.6*  HCT 35.0*  --   --   --  31.6*  PLT PLATELET CLUMPS NOTED ON SMEAR, COUNT APPEARS DECREASED  --   --   --  69*  LABPROT  --  19.9* 19.4*  --   --   INR  --  1.8* 1.7*  --   --   CREATININE 1.06  --  0.75 0.79 0.84    Estimated Creatinine Clearance: 105.5 mL/min (by C-G formula based on SCr of 0.84 mg/dL).   Medical History: Past Medical History:  Diagnosis Date  . Anxiety   . Cirrhosis ([place])   . COVID-19     Medications:  Facility-Administered Medications Prior to Admission  Medication Dose Route Frequency Provider Last Rate Last Admin  . albumin human 5 % solution 75 g  75 g Intravenous Once E[place], Amy S, PA-C       Medications Prior to Admission  Medication Sig Dispense Refill Last Dose  . dicyclomine (BENTYL) 10 MG capsule Take 1 capsule (10 mg total) by mouth 4 (four) times daily -  before meals and at bedtime. 120 capsule 2 02/06/2021 at Unknown time  . folic acid (FOLVITE) 1 MG tablet Take 1 tablet (1 mg total) by mouth daily. 30 tablet 1 02/06/2021 at Unknown time  . furosemide (LASIX) 40 MG tablet Take 1 tablet (40 mg total) by mouth daily. 90 tablet 3 02/06/2021 at Unknown time  . hydrOXYzine (ATARAX/VISTARIL) 10 MG tablet Take 0.5-1  tablets (5-10 mg total) by mouth at bedtime as needed for itching. 30 tablet 0 02/04/2021  . lactulose (CEPHULAC) 10 g packet Take 3 packets (30 g total) by mouth 3 (three) times daily. 90 each 4 02/05/2021 at Unknown time  . spironolactone (ALDACTONE) 100 MG tablet Take 1 tablet (100 mg total) by mouth daily. 90 tablet 0 02/06/2021 at Unknown time  . rifaximin (XIFAXAN) 550 MG TABS tablet Take 1 tablet (550 mg total) by mouth 2 (two) times daily. (Patient not taking: No sig reported) 60 tablet 11 Not Taking at Unknown time  . thiamine 100 MG tablet Take 1 tablet (100 mg total) by mouth daily. (Patient not taking: No sig reported) 30 tablet 1 Not Taking at Unknown time    Assessment: 55 year old male admitted for weakness secondary to liver cirrhosis, NASH, chronic hepatic failure with ascites, history of alcohol use. Lower extremity US found age indeterminate DVT in R LLE. Pharmacy consulted to dose warfarin with lovenox bridging (DOACs not recommended in severe hepatic impairment).  No anticoagulation prior to admission. Baseline INR elevated at 1.7 due to hepatic dysfunction, Vitamin K 5 mg PO was given 3/12 for INR 1.8. CrCl ~100 ml/min. Hgb 11.6 (baseline ~12-13), platelets 69 (baseline 80-90  recently). Watch CBC closely.  Goal of Therapy:  INR 2-3 Monitor platelets by anticoagulation protocol: Yes   Plan:  Lovenox 95 mg SUBQ q12h for at least 5 days and until INR at goal Warfarin 5 mg tonight Monitor daily INR, CBC, s/sx bleeding  Fara Olden, PharmD PGY-1 Pharmacy Resident 02/08/2021 1:55 PM Please see AMION for all pharmacy numbers

## 2021-02-08 NOTE — Progress Notes (Signed)
PROGRESS NOTE                                                                             PROGRESS NOTE                                                                                                                                                                                                             Patient Demographics:    Ian Hall, is a 55 y.o. male, DOB - May 17, 1966, NVB:[id number]  Outpatient Primary MD for the patient is Maximiano Coss, NP    LOS - 2  Admit date - 02/06/2021    Chief Complaint  Patient presents with  . Abnormal Lab       Brief Narrative     HPI: Ian Hall is a 55 y.o. 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  Patient has been feeling generalized weakness and exertional dyspnea for the last 3 to 4 weeks.  He has been losing weight, from 222 lbs 2 months ago to 219 lbs today.  He has been compliant with his versus medication including Lasix and Aldactone and lactulose.  He underwent to paracentesis every 2 to 3 weeks, and last paracentesis was 3 weeks ago.  He is used able to walk 20 to 30 minutes without any problem but lately he found he started to feel shortness of breath after less than 10 minutes walk, denied any cough, no chest pains.  No fever chills.  Today she went to see PCP who did lab work in the office showed sodium 118.  Patient reported that he drinks 4 bottles of 500 mL water every day, in addition he eats 5-6 servings of fruits and 2 serves of salad and oatmeal with milk, so I estimate his daily free water with everything including is ~3000 ml. He does enforcing 2 gram salt restriction.   Subjective:    Ian Hall today reports dyspnea has resolved, he  denies any chest pain, leg pain, no abdominal pain,     Assessment  & Plan :    Active Problems:   Hyponatremia     Hyponatremia, euvolemic -Likely dilutional from excessive water intake -Educated patient at bedside, limited free water intake to 2 L/day . -Significantly improved, it is 128 today.  covid  19 infection -Patient is vaccinated, but not boosted, no hypoxia, will treat with 3 days of IV Remdesivir.  Today is day 2 out of 3  Liver cirrhosis related to [place]/alcohol abuse -No alcohol consumption since December -Appears to be compensated with mild ascites. -Continue with home dose Lasix and Aldactone. -With ascites, 2.6 L drained.  482 total cells, 33% segmented, no evidence of SBP. -Hyperammonemia with elevated ammonia level at 71, continue with lactulose, no evidence of encephalopathy -Evidence of coagulopathy and elevated INR, will give p.o. vitamin K x1 -Continue with 2 g sodium diet, -Continue with rifaximin. -Trend LFTs.  D-dimers, acute DVT -D-dimers were obtained as part of COVID-19 work-up, it is elevated at 0.4, so venous Dopplers was obtained to rule out DVT, it is significant for acute DVT in the right posterior tibial vein. -Given his liver cirrhosis, it is a complicated issue about anticoagulation, I discussed with GI, it seems the best option for now is to bridge with Lovenox to warfarin, pharmacy were consulted.   Hyperalbuminemia -Due to liver cirrhosis, on IV albumin especially he is receiving paracentesis.    Elevated ammonia -No S/S of encephalopathy, continue current lactulose regimen  Non-anion gap Metabolic acidosis -From lactulose induced diarrhea, start bicarb p.o.   SpO2: 98 %  Recent Labs  Lab 02/04/21 1557 02/06/21 1341 02/06/21 1612 02/06/21 1743 02/07/21 0222 02/08/21 0412  WBC 11.0* 9.4  --   --   --  5.8  PLT 93* PLATELET CLUMPS NOTED ON SMEAR, COUNT APPEARS DECREASED  --   --   --  86*  DDIMER  --   --   --   --   --  8.41*  AST 181* 168*  --   --   --  124*  ALT 61* 61*  --   --   --  44  ALKPHOS 420* 324*  --   --   --  262*  BILITOT  13.5* 13.6*  --   --   --  11.8*  ALBUMIN 2.7* 1.8*  --   --   --  2.5*  INR  --   --   --  1.8* 1.7*  --   SARSCOV2NAA  --   --  POSITIVE*  --   --   --        ABG  No results found for: PHART, PCO2ART, PO2ART, HCO3, TCO2, ACIDBASEDEF, O2SAT       Condition - Extremely Guarded  Family Communication  : None at bedside  Code Status : Full  Consults  : Gastroenterology  Procedures  : Ultrasound paracentesis 3/12   Disposition Plan  :    Status is: Inpatient  Remains inpatient appropriate because:IV treatments appropriate due to intensity of illness or inability to take PO   Dispo: The patient is from: Home              Anticipated d/c is to: Home              Patient currently is not medically stable to d/c.   Difficult to place patient No      DVT Prophylaxis  :  SCDs   Lab Results  Component Value Date   PLT 69 (L) 02/08/2021    Diet :  Diet Order            Diet regular Room service appropriate? Yes; Fluid consistency: Thin; Fluid restriction: 1800 mL Fluid  Diet effective now                  Inpatient Medications  Scheduled Meds: . vitamin C  500 mg Oral Daily  . dicyclomine  10 mg Oral TID AC & HS  . [START ON 02/09/2021] enoxaparin (LOVENOX) injection  95 mg Subcutaneous Q12H  . furosemide  40 mg Oral Daily  . lactulose  30 g Oral TID  . rifaximin  550 mg Oral BID  . sodium bicarbonate  650 mg Oral BID  . spironolactone  100 mg Oral Daily  . warfarin  5 mg Oral ONCE-1600  . Warfarin - Pharmacist Dosing Inpatient   Does not apply q1600  . zinc sulfate  220 mg Oral Daily   Continuous Infusions: . remdesivir 100 mg in NS 100 mL 100 mg (02/08/21 1134)   PRN Meds:.albuterol, guaiFENesin-dextromethorphan, hydrOXYzine  Antibiotics  :    Anti-infectives (From admission, onward)   Start     Dose/Rate Route Frequency Ordered Stop   02/08/21 1000  remdesivir 100 mg in sodium chloride 0.9 % 100 mL IVPB       "Followed by" Linked Group  Details   100 mg 200 mL/hr over 30 Minutes Intravenous Daily 02/07/21 0058 02/10/21 0959   02/07/21 1200  cefTRIAXone (ROCEPHIN) 2 g in sodium chloride 0.9 % 100 mL IVPB  Status:  Discontinued        2 g 200 mL/hr over 30 Minutes Intravenous Every 24 hours 02/07/21 1151 02/07/21 1204   02/07/21 0130  remdesivir 200 mg in sodium chloride 0.9% 250 mL IVPB       "Followed by" Linked Group Details   200 mg 580 mL/hr over 30 Minutes Intravenous Once 02/07/21 0058 02/07/21 0421   02/06/21 2200  rifaximin (XIFAXAN) tablet 550 mg        550 mg Oral 2 times daily 02/06/21 1653          Brytani Voth M.D on 02/08/2021 at 3:04 PM  To page go to [email or web address]   [facility] -  Office  [phone]      Objective:   Vitals:   02/07/21 2350 02/08/21 0408 02/08/21 0917 02/08/21 1339  BP: 134/70 127/66 129/76 124/69  Pulse: 96 92    Resp: 17 18 18    Temp: 99.3 F (37.4 C) 98.6 F (37 C) 98.3 F (36.8 C)   TempSrc: Oral Oral Oral   SpO2: 97% 98%    Weight:  93.3 kg    Height:        Wt Readings from Last 3 Encounters:  02/08/21 93.3 kg  02/04/21 99.7 kg  01/29/21 100 kg     Intake/Output Summary (Last 24 hours) at 02/08/2021 1504 Last data filed at 02/08/2021 1247 Gross per 24 hour  Intake 100 ml  Output 300 ml  Net -200 ml     Physical Exam  Awake Alert, Oriented X 3, No new F.N deficits, Normal affect Symmetrical Chest wall movement, Good air movement bilaterally, CTAB RRR,No Gallops,Rubs or new Murmurs, No Parasternal Heave +ve B.Sounds, and is less distended today, No rebound - guarding or rigidity. No Cyanosis, Clubbing or edema, No new Rash or bruise  Data Review:    CBC Recent Labs  Lab 02/04/21 1557 02/06/21 1341 02/08/21 0412  WBC 11.0* 9.4 5.8  HGB 13.0 12.6* 11.6*  HCT 34.5* 35.0* 31.6*  PLT 93* PLATELET CLUMPS NOTED ON SMEAR, COUNT APPEARS DECREASED 69*  MCV 91 95.4 96.3  MCH 34.1* 34.3* 35.4*  MCHC 37.7* 36.0 36.7*  RDW 14.3  14.8 15.5  LYMPHSABS  --   --  1.1  MONOABS  --   --  0.8  EOSABS  --   --  0.1  BASOSABS  --   --  0.1    Recent Labs  Lab 02/04/21 1557 02/06/21 1341 02/06/21 1535 02/06/21 1743 02/07/21 0222 02/07/21 0858 02/08/21 0412  NA 118* 118*  --   --  121* 124* 128*  K 5.1 4.5  --   --  4.6 4.3 4.7  CL 86* 90*  --   --  93* 98 101  CO2 17* 21*  --   --  20* 19* 19*  GLUCOSE 115* 108*  --   --  92 141* 87  BUN 10 14  --   --  14 12 14   CREATININE 0.82 1.06  --   --  0.75 0.79 0.84  CALCIUM 8.1* 7.9*  --   --  8.3* 8.5* 8.6*  AST 181* 168*  --   --   --   --  124*  ALT 61* 61*  --   --   --   --  44  ALKPHOS 420* 324*  --   --   --   --  262*  BILITOT 13.5* 13.6*  --   --   --   --  11.8*  ALBUMIN 2.7* 1.8*  --   --   --   --  2.5*  MG 1.6  --   --   --   --   --  1.7  DDIMER  --   --   --   --   --   --  8.41*  INR  --   --   --  1.8* 1.7*  --   --   HGBA1C 4.3*  --   --   --   --   --   --   AMMONIA  --   --  71*  --   --   --   --     ------------------------------------------------------------------------------------------------------------------ No results for input(s): CHOL, HDL, LDLCALC, TRIG, CHOLHDL, LDLDIRECT in the last 72 hours.  Lab Results  Component Value Date   HGBA1C 4.3 (L) 02/04/2021   ------------------------------------------------------------------------------------------------------------------ No results for input(s): TSH, T4TOTAL, T3FREE, THYROIDAB in the last 72 hours.  Invalid input(s): FREET3  Cardiac Enzymes No results for input(s): CKMB, TROPONINI, MYOGLOBIN in the last 168 hours.  Invalid input(s): CK ------------------------------------------------------------------------------------------------------------------ No results found for: BNP  Micro Results Recent Results (from the past 240 hour(s))  Resp Panel by RT-PCR (Flu A&B, Covid) Nasopharyngeal Swab     Status: Abnormal   Collection Time: 02/06/21  4:12 PM   Specimen:  Nasopharyngeal Swab; Nasopharyngeal(NP) swabs in vial transport medium  Result Value Ref Range Status   SARS Coronavirus 2 by RT PCR POSITIVE (A) NEGATIVE Final    Comment: RESULT CALLED TO, READ BACK BY AND VERIFIED WITH: Rick Duff RN 2024 02/06/21 A BROWNING (NOTE) SARS-CoV-2 target nucleic acids are DETECTED.  The SARS-CoV-2 RNA is generally detectable in upper respiratory specimens during the acute phase of infection. Positive results are indicative  of the presence of the identified virus, but do not rule out bacterial infection or co-infection with other pathogens not detected by the test. Clinical correlation with patient history and other diagnostic information is necessary to determine patient infection status. The expected result is Negative.  Fact Sheet for Patients: EntrepreneurPulse.com.au  Fact Sheet for Healthcare Providers: IncredibleEmployment.be  This test is not yet approved or cleared by the [place] FDA and  has been authorized for detection and/or diagnosis of SARS-CoV-2 by FDA under an Emergency Use Authorization (EUA).  This EUA will remain in effect (meaning this test can b e used) for the duration of  the COVID-19 declaration under Section 564(b)(1) of the Act, 21 U.S.C. section 360bbb-3(b)(1), unless the authorization is terminated or revoked sooner.     Influenza A by PCR NEGATIVE NEGATIVE Final   Influenza B by PCR NEGATIVE NEGATIVE Final    Comment: (NOTE) The Xpert Xpress SARS-CoV-2/FLU/RSV plus assay is intended as an aid in the diagnosis of influenza from Nasopharyngeal swab specimens and should not be used as a sole basis for treatment. Nasal washings and aspirates are unacceptable for Xpert Xpress SARS-CoV-2/FLU/RSV testing.  Fact Sheet for Patients: EntrepreneurPulse.com.au  Fact Sheet for Healthcare Providers: IncredibleEmployment.be  This test is not yet approved  or cleared by the [place] FDA and has been authorized for detection and/or diagnosis of SARS-CoV-2 by FDA under an Emergency Use Authorization (EUA). This EUA will remain in effect (meaning this test can be used) for the duration of the COVID-19 declaration under Section 564(b)(1) of the Act, 21 U.S.C. section 360bbb-3(b)(1), unless the authorization is terminated or revoked.  Performed at [facility], [place] [address] [place], [place] [address]   Gram stain     Status: None (Preliminary result)   Collection Time: 02/07/21  9:21 AM   Specimen: Abdomen; Peritoneal Fluid  Result Value Ref Range Status   Specimen Description PERITONEAL FLUID  Final   Special Requests ABDOMEN  Final   Gram Stain   Final    CYTOSPIN SMEAR WBC PRESENT,BOTH PMN AND MONONUCLEAR NO ORGANISMS SEEN Performed at [facility], [place] [address] [place], [place] [address]    Report Status PENDING  Incomplete    Radiology Reports DG Chest 1 View  Result Date: 02/06/2021 CLINICAL DATA:  Dyspnea. EXAM: CHEST  1 VIEW COMPARISON:  12/26/2020 FINDINGS: Lower lung volumes from prior exam with mild bronchovascular crowding. Stable heart size and mediastinal contours. Streaky opacities at the left lung base favor atelectasis. Hazy opacity in the right costophrenic angle likely represents small effusion. No pneumothorax. Overlying monitoring devices project over the right chest. IMPRESSION: 1. Lower lung volumes from prior exam with bronchovascular crowding. 2. Hazy opacity in the right costophrenic angle likely small effusion. 3. Streaky left basilar opacities favor atelectasis. Electronically Signed   By: Keith Rake M.D.   On: 02/06/2021 17:43   US Paracentesis  Result Date: 02/07/2021 INDICATION: History of cirrhosis. Recurrent ascites. Request for diagnostic and therapeutic paracentesis. EXAM: ULTRASOUND GUIDED RIGHT LOWER QUADRANT PARACENTESIS MEDICATIONS: None. COMPLICATIONS: None immediate.  PROCEDURE: Informed written consent was obtained from the patient after a discussion of the risks, benefits and alternatives to treatment. A timeout was performed prior to the initiation of the procedure. Initial ultrasound scanning demonstrates a large amount of ascites within the right lower abdominal quadrant. The right lower abdomen was prepped and draped in the usual sterile fashion. 1% lidocaine was used for local anesthesia. Following this, a 43  gauge, 7-cm, Yueh catheter was introduced. An ultrasound image was saved for documentation purposes. The paracentesis was performed. The catheter was removed and a dressing was applied. The patient tolerated the procedure well without immediate post procedural complication. FINDINGS: A total of approximately 2.6 L of clear yellow fluid was removed. Samples were sent to the laboratory as requested by the clinical team. IMPRESSION: Successful ultrasound-guided paracentesis yielding 2.6 liters of peritoneal fluid. Read by: Ascencion Dike PA-C Electronically Signed   By: Jacqulynn Cadet M.D.   On: 02/07/2021 10:00   VAS [place] LOWER EXTREMITY VENOUS (DVT)  Result Date: 02/08/2021  Lower Venous DVT Study Other Indications: Covid. Risk Factors: None identified. Comparison Study: No previous Performing Technologist: Vonzell Schlatter RVT  Examination Guidelines: A complete evaluation includes B-mode imaging, spectral Doppler, color Doppler, and power Doppler as needed of all accessible portions of each vessel. Bilateral testing is considered an integral part of a complete examination. Limited examinations for reoccurring indications may be performed as noted. The reflux portion of the exam is performed with the patient in reverse Trendelenburg.  +---------+---------------+---------+-----------+----------+-----------------+ RIGHT    CompressibilityPhasicitySpontaneityPropertiesThrombus Aging    +---------+---------------+---------+-----------+----------+-----------------+  CFV      Full           Yes      Yes                                    +---------+---------------+---------+-----------+----------+-----------------+ SFJ      Full                                                           +---------+---------------+---------+-----------+----------+-----------------+ FV Prox  Full                                                           +---------+---------------+---------+-----------+----------+-----------------+ FV Mid   Full                                                           +---------+---------------+---------+-----------+----------+-----------------+ FV DistalFull                                                           +---------+---------------+---------+-----------+----------+-----------------+ PFV      Full                                                           +---------+---------------+---------+-----------+----------+-----------------+ POP      Full           Yes  Yes                                    +---------+---------------+---------+-----------+----------+-----------------+ PTV      Partial                                      Age Indeterminate +---------+---------------+---------+-----------+----------+-----------------+ PERO     Full                                                           +---------+---------------+---------+-----------+----------+-----------------+   +---------+---------------+---------+-----------+----------+--------------+ LEFT     CompressibilityPhasicitySpontaneityPropertiesThrombus Aging +---------+---------------+---------+-----------+----------+--------------+ CFV      Full           Yes      Yes                                 +---------+---------------+---------+-----------+----------+--------------+ SFJ      Full                                                         +---------+---------------+---------+-----------+----------+--------------+ FV Prox  Full                                                        +---------+---------------+---------+-----------+----------+--------------+ FV Mid   Full                                                        +---------+---------------+---------+-----------+----------+--------------+ FV DistalFull                                                        +---------+---------------+---------+-----------+----------+--------------+ PFV      Full                                                        +---------+---------------+---------+-----------+----------+--------------+ POP      Full           Yes      Yes                                 +---------+---------------+---------+-----------+----------+--------------+ PTV      Full                                                        +---------+---------------+---------+-----------+----------+--------------+  PERO     Full                                                        +---------+---------------+---------+-----------+----------+--------------+  Summary: RIGHT: - Findings consistent with age indeterminate deep vein thrombosis involving a short segment right posterior tibial vein proximal calf. (one ptv) - No cystic structure found in the popliteal fossa.  LEFT: - There is no evidence of deep vein thrombosis in the lower extremity. - There is no evidence of superficial venous thrombosis.  - No cystic structure found in the popliteal fossa.  *See table(s) above for measurements and observations.    Preliminary    IR Paracentesis  Result Date: 01/30/2021 INDICATION: Patient with history of alcoholic cirrhosis, recurrent ascites. Request is made for diagnostic and therapeutic paracentesis. EXAM: ULTRASOUND GUIDED  PARACENTESIS MEDICATIONS: 10 mL 1 % lidocaine COMPLICATIONS: None immediate. PROCEDURE: Informed written consent was obtained  from the patient after a discussion of the risks, benefits and alternatives to treatment. A timeout was performed prior to the initiation of the procedure. Initial ultrasound scanning demonstrates a large amount of ascites within the right lower abdominal quadrant. The right lower abdomen was prepped and draped in the usual sterile fashion. 1% lidocaine was used for local anesthesia. Following this, a 19 gauge, 7-cm, Yueh catheter was introduced. An ultrasound image was saved for documentation purposes. The paracentesis was performed. The catheter was removed and a dressing was applied. The patient tolerated the procedure well without immediate post procedural complication. FINDINGS: A total of approximately 2.4 L of clear yellow fluid was removed. IMPRESSION: Successful ultrasound-guided paracentesis yielding 2.4 liters of peritoneal fluid. Read by: Durenda Guthrie, PA-C Electronically Signed   By: Lucrezia Europe M.D.   On: 01/30/2021 14:14

## 2021-02-08 NOTE — Progress Notes (Signed)
Bilateral lower extremity venous study completed.     RN given results.     Please see CV Proc for preliminary results.   Vonzell Schlatter, RVT

## 2021-02-09 LAB — CBC WITH DIFFERENTIAL/PLATELET
Abs Immature Granulocytes: 0.04 10*3/uL (ref 0.00–0.07)
Basophils Absolute: 0 10*3/uL (ref 0.0–0.1)
Basophils Relative: 1 %
Eosinophils Absolute: 0.1 10*3/uL (ref 0.0–0.5)
Eosinophils Relative: 2 %
HCT: 30.6 % — ABNORMAL LOW (ref 39.0–52.0)
Hemoglobin: 11 g/dL — ABNORMAL LOW (ref 13.0–17.0)
Immature Granulocytes: 1 %
Lymphocytes Relative: 18 %
Lymphs Abs: 1 10*3/uL (ref 0.7–4.0)
MCH: 34.9 pg — ABNORMAL HIGH (ref 26.0–34.0)
MCHC: 35.9 g/dL (ref 30.0–36.0)
MCV: 97.1 fL (ref 80.0–100.0)
Monocytes Absolute: 0.8 10*3/uL (ref 0.1–1.0)
Monocytes Relative: 15 %
Neutro Abs: 3.3 10*3/uL (ref 1.7–7.7)
Neutrophils Relative %: 63 %
Platelets: 61 10*3/uL — ABNORMAL LOW (ref 150–400)
RBC: 3.15 MIL/uL — ABNORMAL LOW (ref 4.22–5.81)
RDW: 15.2 % (ref 11.5–15.5)
WBC: 5.3 10*3/uL (ref 4.0–10.5)
nRBC: 0 % (ref 0.0–0.2)

## 2021-02-09 LAB — COMPREHENSIVE METABOLIC PANEL
ALT: 39 U/L (ref 0–44)
AST: 120 U/L — ABNORMAL HIGH (ref 15–41)
Albumin: 2.3 g/dL — ABNORMAL LOW (ref 3.5–5.0)
Alkaline Phosphatase: 245 U/L — ABNORMAL HIGH (ref 38–126)
Anion gap: 5 (ref 5–15)
BUN: 15 mg/dL (ref 6–20)
CO2: 22 mmol/L (ref 22–32)
Calcium: 8.3 mg/dL — ABNORMAL LOW (ref 8.9–10.3)
Chloride: 103 mmol/L (ref 98–111)
Creatinine, Ser: 0.88 mg/dL (ref 0.61–1.24)
GFR, Estimated: >60 mL/min (ref >60)
Glucose, Bld: 100 mg/dL — ABNORMAL HIGH (ref 70–99)
Potassium: 4.4 mmol/L (ref 3.5–5.1)
Sodium: 130 mmol/L — ABNORMAL LOW (ref 135–145)
Total Bilirubin: 10.7 mg/dL — ABNORMAL HIGH (ref 0.3–1.2)
Total Protein: 4.8 g/dL — ABNORMAL LOW (ref 6.5–8.1)

## 2021-02-09 LAB — PATHOLOGIST SMEAR REVIEW

## 2021-02-09 LAB — D-DIMER, QUANTITATIVE: D-Dimer, Quant: 5.91 ug/mL-FEU — ABNORMAL HIGH (ref 0.00–0.50)

## 2021-02-09 LAB — MAGNESIUM: Magnesium: 1.5 mg/dL — ABNORMAL LOW (ref 1.7–2.4)

## 2021-02-09 LAB — PROTIME-INR
INR: 1.9 — ABNORMAL HIGH (ref 0.8–1.2)
Prothrombin Time: 21.4 seconds — ABNORMAL HIGH (ref 11.4–15.2)

## 2021-02-09 MED ORDER — MAGNESIUM SULFATE 2 GM/50ML IV SOLN
2.0000 g | Freq: Once | INTRAVENOUS | Status: AC
  Administered 2021-02-09: 2 g via INTRAVENOUS
  Filled 2021-02-09: qty 50

## 2021-02-09 MED ORDER — WARFARIN SODIUM 5 MG PO TABS
5.0000 mg | ORAL_TABLET | Freq: Once | ORAL | Status: AC
  Administered 2021-02-09: 5 mg via ORAL
  Filled 2021-02-09: qty 1

## 2021-02-09 NOTE — Progress Notes (Signed)
ANTICOAGULATION CONSULT NOTE  Pharmacy Consult for Warfarin  Indication: DVT  Allergies  Allergen Reactions  . Hydrocodone Itching and Other (See Comments)    Noted with hydrocodone cough syrup. Itching only, no hives.  02/03/16: denied itching with recent hydrocodone cough syrup. Only one episode of itching previously.     Patient Measurements: Height: 5\' 5"  (165.1 cm) Weight: 93.4 kg (205 lb 12.8 oz) IBW/kg (Calculated) : 61.5  Vital Signs: Temp: 99.1 F (37.3 C) (03/14 0627) Temp Source: Oral (03/14 0627) BP: 116/65 (03/14 0627) Pulse Rate: 87 (03/14 0627)  Labs: Recent Labs    02/06/21 1341 02/06/21 1743 02/07/21 0222 02/07/21 0858 02/08/21 0412 02/09/21 0248  HGB 12.6*  --   --   --  11.6* 11.0*  HCT 35.0*  --   --   --  31.6* 30.6*  PLT PLATELET CLUMPS NOTED ON SMEAR, COUNT APPEARS DECREASED  --   --   --  69* 61*  LABPROT  --  19.9* 19.4*  --   --  21.4*  INR  --  1.8* 1.7*  --   --  1.9*  CREATININE 1.06  --  0.75 0.79 0.84 0.88    Estimated Creatinine Clearance: 100.8 mL/min (by C-G formula based on SCr of 0.88 mg/dL).   Medical History: Past Medical History:  Diagnosis Date  . Anxiety   . Cirrhosis (HCC)   . COVID-19     Medications:  Facility-Administered Medications Prior to Admission  Medication Dose Route Frequency Provider Last Rate Last Admin  . albumin human 5 % solution 75 g  75 g Intravenous Once E[place], Amy S, PA-C       Medications Prior to Admission  Medication Sig Dispense Refill Last Dose  . dicyclomine (BENTYL) 10 MG capsule Take 1 capsule (10 mg total) by mouth 4 (four) times daily -  before meals and at bedtime. 120 capsule 2 02/06/2021 at Unknown time  . folic acid (FOLVITE) 1 MG tablet Take 1 tablet (1 mg total) by mouth daily. 30 tablet 1 02/06/2021 at Unknown time  . furosemide (LASIX) 40 MG tablet Take 1 tablet (40 mg total) by mouth daily. 90 tablet 3 02/06/2021 at Unknown time  . hydrOXYzine (ATARAX/VISTARIL) 10 MG tablet  Take 0.5-1 tablets (5-10 mg total) by mouth at bedtime as needed for itching. 30 tablet 0 02/04/2021  . lactulose (CEPHULAC) 10 g packet Take 3 packets (30 g total) by mouth 3 (three) times daily. 90 each 4 02/05/2021 at Unknown time  . spironolactone (ALDACTONE) 100 MG tablet Take 1 tablet (100 mg total) by mouth daily. 90 tablet 0 02/06/2021 at Unknown time  . rifaximin (XIFAXAN) 550 MG TABS tablet Take 1 tablet (550 mg total) by mouth 2 (two) times daily. (Patient not taking: No sig reported) 60 tablet 11 Not Taking at Unknown time  . thiamine 100 MG tablet Take 1 tablet (100 mg total) by mouth daily. (Patient not taking: No sig reported) 30 tablet 1 Not Taking at Unknown time    Assessment: 55 year old male admitted for weakness secondary to liver cirrhosis, NASH, chronic hepatic failure with ascites, history of alcohol use. Lower extremity 57 found age indeterminate DVT in R LLE. Pharmacy consulted to dose warfarin with lovenox bridging (DOACs not recommended in severe hepatic impairment). Baseline INR elevated at 1.7 due to hepatic dysfunction, Vitamin K 5 mg PO was given 3/12 for INR 1.8. -INR= 1.9 -hg= 11, plt= 61 -lovenox d/c 3/14 per MD  Goal of  Therapy:  INR 2-3 Monitor platelets by anticoagulation protocol: Yes   Plan:  Warfarin 5 mg tonight Monitor daily INR, CBC, s/sx bleeding  Harland German, PharmD Clinical Pharmacist **Pharmacist phone directory can now be found on [email or web address] ([email or web address]).  Listed under [facility].

## 2021-02-09 NOTE — Plan of Care (Signed)

## 2021-02-09 NOTE — Plan of Care (Signed)

## 2021-02-09 NOTE — Progress Notes (Signed)
PROGRESS NOTE                                                                             PROGRESS NOTE                                                                                                                                                                                                             Patient Demographics:    Ian Hall, is a 55 y.o. male, DOB - October 21, 1966, HWT:[id number]  Outpatient Primary MD for the patient is Maximiano Coss, NP    LOS - 3  Admit date - 02/06/2021    Chief Complaint  Patient presents with  . Abnormal Lab       Brief Narrative     HPI: Ian Hall is a 55 y.o. 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  Patient has been feeling generalized weakness and exertional dyspnea for the last 3 to 4 weeks.  He has been losing weight, from 222 lbs 2 months ago to 219 lbs today.  He has been compliant with his versus medication including Lasix and Aldactone and lactulose.  He underwent to paracentesis every 2 to 3 weeks, and last paracentesis was 3 weeks ago.  He is used able to walk 20 to 30 minutes without any problem but lately he found he started to feel shortness of breath after less than 10 minutes walk, denied any cough, no chest pains.  No fever chills.  Today she went to see PCP who did lab work in the office showed sodium 118.  Patient reported that he drinks 4 bottles of 500 mL water every day, in addition he eats 5-6 servings of fruits and 2 serves of salad and oatmeal with milk, so I estimate his daily free water with everything including is ~3000 ml. He does enforcing 2 gram salt restriction.   Subjective:    Ian Hall today denies abdominal pain, chest pain,  dyspnea    Assessment  & Plan :    Active Problems:   Hyponatremia    Hyponatremia, euvolemic -Likely dilutional from  excessive water intake -Educated patient at bedside, limited free water intake to 2 L/day . -Significantly improved, it is 130 today.  covid  19 infection -Patient is vaccinated, but not boosted, no hypoxia, will treat with 3 days of IV Remdesivir.  Treated with 3 days of IV Remdesivir  Liver cirrhosis related to [place]/alcohol abuse -No alcohol consumption since December -Appears to be compensated with mild ascites. -Continue with home dose Lasix and Aldactone. -With ascites, 2.6 L drained.  482 total cells, 33% segmented, no evidence of SBP. -Hyperammonemia with elevated ammonia level at 71, continue with lactulose, no evidence of encephalopathy -Evidence of coagulopathy and elevated INR, give vitamin K initially -Continue with 2 g sodium diet, -Continue with rifaximin. -Trend LFTs.  D-dimers, acute DVT -D-dimers were obtained as part of COVID-19 work-up, it is elevated at 0.4, so venous Dopplers was obtained to rule out DVT, it is significant for acute DVT in the right posterior tibial vein. -Given his liver cirrhosis, it is a complicated issue about anticoagulation, I discussed with GI, it seems the best option for now is to bridge with Lovenox to warfarin, pharmacy were consulted.  INR is 1.9 today, so I have stopped his Lovenox, will continue with warfarin, and will target INR in the lower range less than 2.5.  Hyperalbuminemia -Due to liver cirrhosis, on IV albumin especially he is receiving paracentesis.    Elevated ammonia -No S/S of encephalopathy, continue current lactulose regimen  Non-anion gap Metabolic acidosis -From lactulose induced diarrhea, start bicarb p.o.   SpO2: 98 %  Recent Labs  Lab 02/04/21 1557 02/06/21 1341 02/06/21 1612 02/06/21 1743 02/07/21 0222 02/08/21 0412 02/09/21 0248  WBC 11.0* 9.4  --   --   --  5.8 5.3  PLT 93* PLATELET CLUMPS NOTED ON SMEAR, COUNT APPEARS DECREASED  --   --   --  69* 61*  DDIMER  --   --   --   --   --  8.41* 5.91*   AST 181* 168*  --   --   --  124* 120*  ALT 61* 61*  --   --   --  44 39  ALKPHOS 420* 324*  --   --   --  262* 245*  BILITOT 13.5* 13.6*  --   --   --  11.8* 10.7*  ALBUMIN 2.7* 1.8*  --   --   --  2.5* 2.3*  INR  --   --   --  1.8* 1.7*  --  1.9*  SARSCOV2NAA  --   --  POSITIVE*  --   --   --   --        ABG  No results found for: PHART, PCO2ART, PO2ART, HCO3, TCO2, ACIDBASEDEF, O2SAT       Condition - Extremely Guarded  Family Communication  : None at bedside  Code Status : Full  Consults  : None  Procedures  : Ultrasound paracentesis 3/12   Disposition Plan  :    Status is: Inpatient  Remains inpatient appropriate because:IV treatments appropriate due to intensity of illness or inability to take PO   Dispo: The patient is from: Home              Anticipated d/c is to: Home  Patient currently is not medically stable to d/c.   Difficult to place patient No      DVT Prophylaxis  :  SCDs   Lab Results  Component Value Date   PLT 61 (L) 02/09/2021    Diet :  Diet Order            Diet regular Room service appropriate? Yes; Fluid consistency: Thin; Fluid restriction: 1800 mL Fluid  Diet effective now                  Inpatient Medications  Scheduled Meds: . vitamin C  500 mg Oral Daily  . dicyclomine  10 mg Oral TID AC & HS  . furosemide  40 mg Oral Daily  . lactulose  30 g Oral TID  . rifaximin  550 mg Oral BID  . sodium bicarbonate  650 mg Oral BID  . spironolactone  100 mg Oral Daily  . warfarin  5 mg Oral ONCE-1600  . Warfarin - Pharmacist Dosing Inpatient   Does not apply q1600  . zinc sulfate  220 mg Oral Daily   Continuous Infusions:  PRN Meds:.albuterol, guaiFENesin-dextromethorphan, hydrOXYzine  Antibiotics  :    Anti-infectives (From admission, onward)   Start     Dose/Rate Route Frequency Ordered Stop   02/08/21 1000  remdesivir 100 mg in sodium chloride 0.9 % 100 mL IVPB       "Followed by" Linked Group  Details   100 mg 200 mL/hr over 30 Minutes Intravenous Daily 02/07/21 0058 02/09/21 1023   02/07/21 1200  cefTRIAXone (ROCEPHIN) 2 g in sodium chloride 0.9 % 100 mL IVPB  Status:  Discontinued        2 g 200 mL/hr over 30 Minutes Intravenous Every 24 hours 02/07/21 1151 02/07/21 1204   02/07/21 0130  remdesivir 200 mg in sodium chloride 0.9% 250 mL IVPB       "Followed by" Linked Group Details   200 mg 580 mL/hr over 30 Minutes Intravenous Once 02/07/21 0058 02/07/21 0421   02/06/21 2200  rifaximin (XIFAXAN) tablet 550 mg        550 mg Oral 2 times daily 02/06/21 1653          Flavia Bruss M.D on 02/09/2021 at 2:04 PM  To page go to [email or web address]   [facility] -  Office  [phone]      Objective:   Vitals:   02/08/21 1339 02/08/21 1934 02/09/21 0042 02/09/21 0627  BP: 124/69 118/90 121/70 116/65  Pulse:  92 99 87  Resp:  18 17 18   Temp:  99.1 F (37.3 C) 99.3 F (37.4 C) 99.1 F (37.3 C)  TempSrc:  Oral Oral Oral  SpO2:  99% 97% 98%  Weight:    93.4 kg  Height:        Wt Readings from Last 3 Encounters:  02/09/21 93.4 kg  02/04/21 99.7 kg  01/29/21 100 kg     Intake/Output Summary (Last 24 hours) at 02/09/2021 1404 Last data filed at 02/09/2021 0840 Gross per 24 hour  Intake 240 ml  Output --  Net 240 ml     Physical Exam  Awake Alert, Oriented X 3, No new F.N deficits, Normal affect Symmetrical Chest wall movement, Good air movement bilaterally, CTAB RRR,No Gallops,Rubs or new Murmurs, No Parasternal Heave +ve B.Sounds, Abd mildly distended., No tenderness, No rebound - guarding or rigidity. No Cyanosis, Clubbing or edema, No new Rash or bruise  Data Review:    CBC Recent Labs  Lab 02/04/21 1557 02/06/21 1341 02/08/21 0412 02/09/21 0248  WBC 11.0* 9.4 5.8 5.3  HGB 13.0 12.6* 11.6* 11.0*  HCT 34.5* 35.0* 31.6* 30.6*  PLT 93* PLATELET CLUMPS NOTED ON SMEAR, COUNT APPEARS DECREASED 69* 61*  MCV 91 95.4 96.3 97.1  MCH  34.1* 34.3* 35.4* 34.9*  MCHC 37.7* 36.0 36.7* 35.9  RDW 14.3 14.8 15.5 15.2  LYMPHSABS  --   --  1.1 1.0  MONOABS  --   --  0.8 0.8  EOSABS  --   --  0.1 0.1  BASOSABS  --   --  0.1 0.0    Recent Labs  Lab 02/04/21 1557 02/06/21 1341 02/06/21 1535 02/06/21 1743 02/07/21 0222 02/07/21 0858 02/08/21 0412 02/09/21 0248  NA 118* 118*  --   --  121* 124* 128* 130*  K 5.1 4.5  --   --  4.6 4.3 4.7 4.4  CL 86* 90*  --   --  93* 98 101 103  CO2 17* 21*  --   --  20* 19* 19* 22  GLUCOSE 115* 108*  --   --  92 141* 87 100*  BUN 10 14  --   --  14 12 14 15   CREATININE 0.82 1.06  --   --  0.75 0.79 0.84 0.88  CALCIUM 8.1* 7.9*  --   --  8.3* 8.5* 8.6* 8.3*  AST 181* 168*  --   --   --   --  124* 120*  ALT 61* 61*  --   --   --   --  44 39  ALKPHOS 420* 324*  --   --   --   --  262* 245*  BILITOT 13.5* 13.6*  --   --   --   --  11.8* 10.7*  ALBUMIN 2.7* 1.8*  --   --   --   --  2.5* 2.3*  MG 1.6  --   --   --   --   --  1.7 1.5*  DDIMER  --   --   --   --   --   --  8.41* 5.91*  INR  --   --   --  1.8* 1.7*  --   --  1.9*  HGBA1C 4.3*  --   --   --   --   --   --   --   AMMONIA  --   --  71*  --   --   --   --   --     ------------------------------------------------------------------------------------------------------------------ No results for input(s): CHOL, HDL, LDLCALC, TRIG, CHOLHDL, LDLDIRECT in the last 72 hours.  Lab Results  Component Value Date   HGBA1C 4.3 (L) 02/04/2021   ------------------------------------------------------------------------------------------------------------------ No results for input(s): TSH, T4TOTAL, T3FREE, THYROIDAB in the last 72 hours.  Invalid input(s): FREET3  Cardiac Enzymes No results for input(s): CKMB, TROPONINI, MYOGLOBIN in the last 168 hours.  Invalid input(s): CK ------------------------------------------------------------------------------------------------------------------ No results found for: BNP  Micro  Results Recent Results (from the past 240 hour(s))  Resp Panel by RT-PCR (Flu A&B, Covid) Nasopharyngeal Swab     Status: Abnormal   Collection Time: 02/06/21  4:12 PM   Specimen: Nasopharyngeal Swab; Nasopharyngeal(NP) swabs in vial transport medium  Result Value Ref Range Status   SARS Coronavirus 2 by RT PCR POSITIVE (A) NEGATIVE Final    Comment: RESULT CALLED TO, READ BACK BY  AND VERIFIED WITH: Rick Duff RN 2024 02/06/21 A BROWNING (NOTE) SARS-CoV-2 target nucleic acids are DETECTED.  The SARS-CoV-2 RNA is generally detectable in upper respiratory specimens during the acute phase of infection. Positive results are indicative of the presence of the identified virus, but do not rule out bacterial infection or co-infection with other pathogens not detected by the test. Clinical correlation with patient history and other diagnostic information is necessary to determine patient infection status. The expected result is Negative.  Fact Sheet for Patients: EntrepreneurPulse.com.au  Fact Sheet for Healthcare Providers: IncredibleEmployment.be  This test is not yet approved or cleared by the [place] FDA and  has been authorized for detection and/or diagnosis of SARS-CoV-2 by FDA under an Emergency Use Authorization (EUA).  This EUA will remain in effect (meaning this test can b e used) for the duration of  the COVID-19 declaration under Section 564(b)(1) of the Act, 21 U.S.C. section 360bbb-3(b)(1), unless the authorization is terminated or revoked sooner.     Influenza A by PCR NEGATIVE NEGATIVE Final   Influenza B by PCR NEGATIVE NEGATIVE Final    Comment: (NOTE) The Xpert Xpress SARS-CoV-2/FLU/RSV plus assay is intended as an aid in the diagnosis of influenza from Nasopharyngeal swab specimens and should not be used as a sole basis for treatment. Nasal washings and aspirates are unacceptable for Xpert Xpress  SARS-CoV-2/FLU/RSV testing.  Fact Sheet for Patients: EntrepreneurPulse.com.au  Fact Sheet for Healthcare Providers: IncredibleEmployment.be  This test is not yet approved or cleared by the [place] FDA and has been authorized for detection and/or diagnosis of SARS-CoV-2 by FDA under an Emergency Use Authorization (EUA). This EUA will remain in effect (meaning this test can be used) for the duration of the COVID-19 declaration under Section 564(b)(1) of the Act, 21 U.S.C. section 360bbb-3(b)(1), unless the authorization is terminated or revoked.  Performed at [facility], [place] [address] [place], [place] [address]   Culture, blood (Routine X 2) w Reflex to ID Panel     Status: None (Preliminary result)   Collection Time: 02/07/21  2:25 AM   Specimen: BLOOD  Result Value Ref Range Status   Specimen Description BLOOD RIGHT HAND  Final   Special Requests   Final    BOTTLES DRAWN AEROBIC AND ANAEROBIC Blood Culture adequate volume   Culture   Final    NO GROWTH 2 DAYS Performed at [facility], [place] [address], [place], [place] [address]    Report Status PENDING  Incomplete  Culture, blood (Routine X 2) w Reflex to ID Panel     Status: None (Preliminary result)   Collection Time: 02/07/21  2:33 AM   Specimen: BLOOD  Result Value Ref Range Status   Specimen Description BLOOD LEFT HAND  Final   Special Requests   Final    BOTTLES DRAWN AEROBIC AND ANAEROBIC Blood Culture results may not be optimal due to an inadequate volume of blood received in culture bottles   Culture   Final    NO GROWTH 2 DAYS Performed at [facility], [place] [address] [place],  [address]    Report Status PENDING  Incomplete  Gram stain     Status: None   Collection Time: 02/07/21  9:21 AM   Specimen: Abdomen; Peritoneal Fluid  Result Value Ref Range Status   Specimen Description PERITONEAL FLUID  Final   Special Requests ABDOMEN  Final    Gram Stain   Final  CYTOSPIN SMEAR WBC PRESENT,BOTH PMN AND MONONUCLEAR NO ORGANISMS SEEN Performed at [facility], [place] [address], [place], [place] [address]    Report Status 02/08/2021 FINAL  Final  Culture, body fluid w Gram Stain-bottle     Status: None (Preliminary result)   Collection Time: 02/07/21  9:21 AM   Specimen: Peritoneal Washings  Result Value Ref Range Status   Specimen Description PERITONEAL FLUID  Final   Special Requests ABDOMEN  Final   Culture   Final    NO GROWTH 2 DAYS Performed at [place] [address] [place],  [address]    Report Status PENDING  Incomplete    Radiology Reports DG Chest 1 View  Result Date: 02/06/2021 CLINICAL DATA:  Dyspnea. EXAM: CHEST  1 VIEW COMPARISON:  12/26/2020 FINDINGS: Lower lung volumes from prior exam with mild bronchovascular crowding. Stable heart size and mediastinal contours. Streaky opacities at the left lung base favor atelectasis. Hazy opacity in the right costophrenic angle likely represents small effusion. No pneumothorax. Overlying monitoring devices project over the right chest. IMPRESSION: 1. Lower lung volumes from prior exam with bronchovascular crowding. 2. Hazy opacity in the right costophrenic angle likely small effusion. 3. Streaky left basilar opacities favor atelectasis. Electronically Signed   By: Keith Rake M.D.   On: 02/06/2021 17:43   US Paracentesis  Result Date: 02/07/2021 INDICATION: History of cirrhosis. Recurrent ascites. Request for diagnostic and therapeutic paracentesis. EXAM: ULTRASOUND GUIDED RIGHT LOWER QUADRANT PARACENTESIS MEDICATIONS: None. COMPLICATIONS: None immediate. PROCEDURE: Informed written consent was obtained from the patient after a discussion of the risks, benefits and alternatives to treatment. A timeout was performed prior to the initiation of the procedure. Initial ultrasound scanning demonstrates a large amount of ascites within the right lower  abdominal quadrant. The right lower abdomen was prepped and draped in the usual sterile fashion. 1% lidocaine was used for local anesthesia. Following this, a 19 gauge, 7-cm, Yueh catheter was introduced. An ultrasound image was saved for documentation purposes. The paracentesis was performed. The catheter was removed and a dressing was applied. The patient tolerated the procedure well without immediate post procedural complication. FINDINGS: A total of approximately 2.6 L of clear yellow fluid was removed. Samples were sent to the laboratory as requested by the clinical team. IMPRESSION: Successful ultrasound-guided paracentesis yielding 2.6 liters of peritoneal fluid. Read by: Ascencion Dike PA-C Electronically Signed   By: Jacqulynn Cadet M.D.   On: 02/07/2021 10:00   VAS [place] LOWER EXTREMITY VENOUS (DVT)  Result Date: 02/08/2021  Lower Venous DVT Study Other Indications: Covid. Risk Factors: None identified. Comparison Study: No previous Performing Technologist: Vonzell Schlatter RVT  Examination Guidelines: A complete evaluation includes B-mode imaging, spectral Doppler, color Doppler, and power Doppler as needed of all accessible portions of each vessel. Bilateral testing is considered an integral part of a complete examination. Limited examinations for reoccurring indications may be performed as noted. The reflux portion of the exam is performed with the patient in reverse Trendelenburg.  +---------+---------------+---------+-----------+----------+-----------------+ RIGHT    CompressibilityPhasicitySpontaneityPropertiesThrombus Aging    +---------+---------------+---------+-----------+----------+-----------------+ CFV      Full           Yes      Yes                                    +---------+---------------+---------+-----------+----------+-----------------+ SFJ      Full                                                            +---------+---------------+---------+-----------+----------+-----------------+  FV Prox  Full                                                           +---------+---------------+---------+-----------+----------+-----------------+ FV Mid   Full                                                           +---------+---------------+---------+-----------+----------+-----------------+ FV DistalFull                                                           +---------+---------------+---------+-----------+----------+-----------------+ PFV      Full                                                           +---------+---------------+---------+-----------+----------+-----------------+ POP      Full           Yes      Yes                                    +---------+---------------+---------+-----------+----------+-----------------+ PTV      Partial                                      Age Indeterminate +---------+---------------+---------+-----------+----------+-----------------+ PERO     Full                                                           +---------+---------------+---------+-----------+----------+-----------------+   +---------+---------------+---------+-----------+----------+--------------+ LEFT     CompressibilityPhasicitySpontaneityPropertiesThrombus Aging +---------+---------------+---------+-----------+----------+--------------+ CFV      Full           Yes      Yes                                 +---------+---------------+---------+-----------+----------+--------------+ SFJ      Full                                                        +---------+---------------+---------+-----------+----------+--------------+ FV Prox  Full                                                        +---------+---------------+---------+-----------+----------+--------------+  FV Mid   Full                                                         +---------+---------------+---------+-----------+----------+--------------+ FV DistalFull                                                        +---------+---------------+---------+-----------+----------+--------------+ PFV      Full                                                        +---------+---------------+---------+-----------+----------+--------------+ POP      Full           Yes      Yes                                 +---------+---------------+---------+-----------+----------+--------------+ PTV      Full                                                        +---------+---------------+---------+-----------+----------+--------------+ PERO     Full                                                        +---------+---------------+---------+-----------+----------+--------------+  Summary: RIGHT: - Findings consistent with age indeterminate deep vein thrombosis involving a short segment right posterior tibial vein proximal calf. (one ptv) - No cystic structure found in the popliteal fossa.  LEFT: - There is no evidence of deep vein thrombosis in the lower extremity. - There is no evidence of superficial venous thrombosis.  - No cystic structure found in the popliteal fossa.  *See table(s) above for measurements and observations. Electronically signed by Harold Barban MD on 02/08/2021 at 9:08:22 PM.    Final    IR Paracentesis  Result Date: 01/30/2021 INDICATION: Patient with history of alcoholic cirrhosis, recurrent ascites. Request is made for diagnostic and therapeutic paracentesis. EXAM: ULTRASOUND GUIDED  PARACENTESIS MEDICATIONS: 10 mL 1 % lidocaine COMPLICATIONS: None immediate. PROCEDURE: Informed written consent was obtained from the patient after a discussion of the risks, benefits and alternatives to treatment. A timeout was performed prior to the initiation of the procedure. Initial ultrasound scanning demonstrates a large amount of ascites within the right  lower abdominal quadrant. The right lower abdomen was prepped and draped in the usual sterile fashion. 1% lidocaine was used for local anesthesia. Following this, a 19 gauge, 7-cm, Yueh catheter was introduced. An ultrasound image was saved for documentation purposes. The paracentesis was performed. The catheter was removed and a dressing was applied. The patient tolerated the procedure well without  immediate post procedural complication. FINDINGS: A total of approximately 2.4 L of clear yellow fluid was removed. IMPRESSION: Successful ultrasound-guided paracentesis yielding 2.4 liters of peritoneal fluid. Read by: Durenda Guthrie, PA-C Electronically Signed   By: Lucrezia Europe M.D.   On: 01/30/2021 14:14

## 2021-02-09 NOTE — Plan of Care (Signed)
Problem: Education: Goal: Knowledge of General Education information will improve Description: Including pain rating scale, medication(s)/side effects and non-pharmacologic comfort measures 02/09/2021 2323 by Robley Fries, RN Outcome: Progressing 02/09/2021 2305 by Robley Fries, RN Outcome: Progressing   Problem: Health Behavior/Discharge Planning: Goal: Ability to manage health-related needs will improve 02/09/2021 2323 by Robley Fries, RN Outcome: Progressing 02/09/2021 2305 by Robley Fries, RN Outcome: Progressing   Problem: Clinical Measurements: Goal: Ability to maintain clinical measurements within normal limits will improve 02/09/2021 2323 by Robley Fries, RN Outcome: Progressing 02/09/2021 2305 by Robley Fries, RN Outcome: Progressing Goal: Will remain free from infection 02/09/2021 2323 by Robley Fries, RN Outcome: Progressing 02/09/2021 2305 by Robley Fries, RN Outcome: Progressing Goal: Diagnostic test results will improve 02/09/2021 2323 by Robley Fries, RN Outcome: Progressing 02/09/2021 2305 by Robley Fries, RN Outcome: Progressing Goal: Respiratory complications will improve 02/09/2021 2323 by Robley Fries, RN Outcome: Progressing 02/09/2021 2305 by Robley Fries, RN Outcome: Progressing Goal: Cardiovascular complication will be avoided 02/09/2021 2323 by Robley Fries, RN Outcome: Progressing 02/09/2021 2305 by Robley Fries, RN Outcome: Progressing   Problem: Activity: Goal: Risk for activity intolerance will decrease 02/09/2021 2323 by Robley Fries, RN Outcome: Progressing 02/09/2021 2305 by Robley Fries, RN Outcome: Progressing   Problem: Nutrition: Goal: Adequate nutrition will be maintained 02/09/2021 2323 by Robley Fries, RN Outcome: Progressing 02/09/2021 2305 by Robley Fries, RN Outcome: Progressing   Problem: Coping: Goal: Level of anxiety will decrease 02/09/2021 2323 by Robley Fries, RN Outcome: Progressing 02/09/2021 2305 by Robley Fries, RN Outcome: Progressing   Problem: Elimination: Goal: Will not experience complications related to bowel motility 02/09/2021 2323 by Robley Fries, RN Outcome: Progressing 02/09/2021 2305 by Robley Fries, RN Outcome: Progressing Goal: Will not experience complications related to urinary retention 02/09/2021 2323 by Robley Fries, RN Outcome: Progressing 02/09/2021 2305 by Robley Fries, RN Outcome: Progressing   Problem: Pain Managment: Goal: General experience of comfort will improve 02/09/2021 2323 by Robley Fries, RN Outcome: Progressing 02/09/2021 2305 by Robley Fries, RN Outcome: Progressing   Problem: Safety: Goal: Ability to remain free from injury will improve 02/09/2021 2323 by Robley Fries, RN Outcome: Progressing 02/09/2021 2305 by Robley Fries, RN Outcome: Progressing   Problem: Skin Integrity: Goal: Risk for impaired skin integrity will decrease 02/09/2021 2323 by Robley Fries, RN Outcome: Progressing 02/09/2021 2305 by Robley Fries, RN Outcome: Progressing   Problem: Education: Goal: Knowledge of Pancreatitis treatment and prevention will improve 02/09/2021 2323 by Robley Fries, RN Outcome: Progressing 02/09/2021 2305 by Robley Fries, RN Outcome: Progressing   Problem: Health Behavior/Discharge Planning: Goal: Ability to formulate a plan to maintain an alcohol-free life will improve 02/09/2021 2323 by Robley Fries, RN Outcome: Progressing 02/09/2021 2305 by Robley Fries, RN Outcome: Progressing   Problem: Nutritional: Goal: Ability to achieve adequate nutritional intake will improve 02/09/2021 2323 by Robley Fries, RN Outcome: Progressing 02/09/2021 2305 by Robley Fries, RN Outcome: Progressing   Problem: Clinical Measurements: Goal: Complications related to the disease process, condition or treatment will be avoided or  minimized 02/09/2021 2323 by Robley Fries, RN Outcome: Progressing 02/09/2021 2305 by Robley Fries, RN Outcome: Progressing   Problem: Education: Goal: Knowledge of risk factors and measures for prevention of condition will improve Outcome: Progressing   Problem: Coping: Goal: Psychosocial and  spiritual needs will be supported Outcome: Progressing   Problem: Respiratory: Goal: Will maintain a patent airway Outcome: Progressing Goal: Complications related to the disease process, condition or treatment will be avoided or minimized Outcome: Progressing

## 2021-02-10 ENCOUNTER — Ambulatory Visit: Payer: No Typology Code available for payment source | Admitting: Physical Therapy

## 2021-02-10 LAB — CBC WITH DIFFERENTIAL/PLATELET
Abs Immature Granulocytes: 0.04 K/uL (ref 0.00–0.07)
Basophils Absolute: 0 K/uL (ref 0.0–0.1)
Basophils Relative: 1 %
Eosinophils Absolute: 0.2 K/uL (ref 0.0–0.5)
Eosinophils Relative: 3 %
HCT: 30.8 % — ABNORMAL LOW (ref 39.0–52.0)
Hemoglobin: 10.8 g/dL — ABNORMAL LOW (ref 13.0–17.0)
Immature Granulocytes: 1 %
Lymphocytes Relative: 24 %
Lymphs Abs: 1.3 K/uL (ref 0.7–4.0)
MCH: 34.3 pg — ABNORMAL HIGH (ref 26.0–34.0)
MCHC: 35.1 g/dL (ref 30.0–36.0)
MCV: 97.8 fL (ref 80.0–100.0)
Monocytes Absolute: 0.8 K/uL (ref 0.1–1.0)
Monocytes Relative: 14 %
Neutro Abs: 3 K/uL (ref 1.7–7.7)
Neutrophils Relative %: 57 %
Platelets: 58 K/uL — ABNORMAL LOW (ref 150–400)
RBC: 3.15 MIL/uL — ABNORMAL LOW (ref 4.22–5.81)
RDW: 15.3 % (ref 11.5–15.5)
WBC: 5.3 K/uL (ref 4.0–10.5)
nRBC: 0 % (ref 0.0–0.2)

## 2021-02-10 LAB — PROTIME-INR
INR: 2.9 — ABNORMAL HIGH (ref 0.8–1.2)
Prothrombin Time: 29.3 seconds — ABNORMAL HIGH (ref 11.4–15.2)

## 2021-02-10 LAB — MAGNESIUM: Magnesium: 1.7 mg/dL (ref 1.7–2.4)

## 2021-02-10 MED ORDER — MAGNESIUM SULFATE IN D5W 1-5 GM/100ML-% IV SOLN
1.0000 g | Freq: Once | INTRAVENOUS | Status: AC
  Administered 2021-02-10: 1 g via INTRAVENOUS
  Filled 2021-02-10: qty 100

## 2021-02-10 NOTE — Progress Notes (Signed)
Called report to Randall Hiss, RN 5 [place] for room 21.

## 2021-02-10 NOTE — Progress Notes (Signed)
ANTICOAGULATION CONSULT NOTE - Follow Up Consult  Pharmacy Consult for Coumadin Indication: DVT  Allergies  Allergen Reactions  . Hydrocodone Itching and Other (See Comments)    Noted with hydrocodone cough syrup. Itching only, no hives.  02/03/16: denied itching with recent hydrocodone cough syrup. Only one episode of itching previously.     Patient Measurements: Height: 5' 5"  (165.1 cm) Weight: 93.4 kg (205 lb 12.8 oz) IBW/kg (Calculated) : 61.5  Vital Signs: Temp: 98.1 F (36.7 C) (03/15 0750) Temp Source: Oral (03/15 0750) BP: 121/68 (03/15 0750) Pulse Rate: 68 (03/15 0750)  Labs: Recent Labs    02/07/21 0858 02/08/21 0412 02/08/21 0412 02/09/21 0248 02/10/21 0147  HGB  --  11.6*   < > 11.0* 10.8*  HCT  --  31.6*  --  30.6* 30.8*  PLT  --  69*  --  61* 58*  LABPROT  --   --   --  21.4* 29.3*  INR  --   --   --  1.9* 2.9*  CREATININE 0.79 0.84  --  0.88  --    < > = values in this interval not displayed.    Estimated Creatinine Clearance: 100.8 mL/min (by C-G formula based on SCr of 0.88 mg/dL).   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 Warfarin started 3/13. Lovenox d/c 3/14 per MD. Ddimer 8.41>5.91.INR today 1.9>2.9. Hgb down to 10.8. Plts only 58. - 3/11: s/p vit K 8m po (INR 1.8 not on Coumadin yet)  Goal of Therapy:  INR 2-3 Monitor platelets by anticoagulation protocol: Yes   Plan:  Hold Coumadin today Daily INR   Terril Chestnut S. RAlford Highland PharmD, BCPS Clinical Staff Pharmacist [email or web address] RAlford Highland CBergman3/15/2022,8:46 AM

## 2021-02-10 NOTE — Progress Notes (Signed)
CSW received consult regarding questions about insurance. CSW spoke with patient. He requested help with paying the hospital bills. He stated he has already contacted the billing department to set up a payment plan but is not sure how much longer his job will help him pay. CSW directed him to apply for Medicaid through DSS; info placed on AVS. Patient confirmed his PCP and he will also follow up with them to see if they have a Education officer, museum. CSW also completed [facility] referral.   Cedric Fishman LCSW

## 2021-02-10 NOTE — Progress Notes (Addendum)
PROGRESS NOTE                                                                             PROGRESS NOTE                                                                                                                                                                                                             Patient Demographics:    Ian Hall, is a 55 y.o. male, DOB - Dec 09, 1965, IRJ:[id number]  Outpatient Primary MD for the patient is Ian Coss, NP    LOS - 4  Admit date - 02/06/2021    Chief Complaint  Patient presents with  . Abnormal Lab       Brief Narrative     HPI: Ian Hall is a 55 y.o. 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  Patient presents to his PCP with generalized weakness, work-up significant for hyponatremia of 118, he endorses drinking excessive water to stay hydrated, his work-up was significant for incidental COVID-19 positive, he was admitted for hyponatremia management, he was treated with Remdesivir x3 days, hyponatremia corrected with 2 g salt restriction and Procrit water intake, D-dimers was elevated, venous Dopplers was obtained, significant for acute DVT, for which she is started on warfarin.    Subjective:    Ian Hall today denies abdominal pain, nausea, vomiting, chest pain or shortness of breath.      Assessment  & Plan :    Active Problems:   Hyponatremia    Hyponatremia, euvolemic -Likely dilutional from excessive water intake -Educated patient at bedside, limited free water intake to 2 L/day . -Significantly improved, it is 130 today.  covid  19 infection -Patient is vaccinated, but not boosted, no hypoxia, will treat with 3 days of IV Remdesivir.  Treated with 3 days of IV Remdesivir  Liver cirrhosis related to [place]/alcohol abuse -No alcohol consumption since  December -Appears to be compensated with  mild ascites. -Continue with home dose Lasix and Aldactone. -With ascites, 2.6 L drained.  482 total cells, 33% segmented, no evidence of SBP. -Hyperammonemia with elevated ammonia level at 71, continue with lactulose, no evidence of encephalopathy -Evidence of coagulopathy and elevated INR, give vitamin K initially -Continue with 2 g sodium diet, -Continue with rifaximin. -Trend LFTs.  D-dimers, acute DVT -D-dimers were obtained as part of COVID-19 work-up, it is elevated at 0.4, so venous Dopplers was obtained to rule out DVT, it is significant for acute DVT in the right posterior tibial vein. -Given his liver cirrhosis, it is a complicated issue about anticoagulation, I discussed with GI, it seems the best option for now is to bridge with Lovenox to warfarin, pharmacy were consulted.  INR is 2.9 today, is not receiving any warfarin today, will repeat INR in a.m.,  Ideally would like to keep his INR in the lower therapeutic range in the setting of his coagulopathy and thrombocytopenia from liver cirrhosis.  -Patient will miss his follow-up appointment with liver transplant clinic in [facility] tomorrow, so it was rescheduled for next Tuesday 3/22 at 10:30 AM.  Hyperalbuminemia -Due to liver cirrhosis, recieved IV albumin  As he had paracentesis.    Elevated ammonia -No S/S of encephalopathy, continue current lactulose regimen  Non-anion gap Metabolic acidosis -From lactulose induced diarrhea, on bicarb p.o.   SpO2: 100 %  Recent Labs  Lab 02/04/21 1557 02/06/21 1341 02/06/21 1612 02/06/21 1743 02/07/21 0222 02/08/21 0412 02/09/21 0248 02/10/21 0147  WBC 11.0* 9.4  --   --   --  5.8 5.3 5.3  PLT 93* PLATELET CLUMPS NOTED ON SMEAR, COUNT APPEARS DECREASED  --   --   --  69* 61* 58*  DDIMER  --   --   --   --   --  8.41* 5.91*  --   AST 181* 168*  --   --   --  124* 120*  --   ALT 61* 61*  --   --   --  44 39  --   ALKPHOS 420* 324*   --   --   --  262* 245*  --   BILITOT 13.5* 13.6*  --   --   --  11.8* 10.7*  --   ALBUMIN 2.7* 1.8*  --   --   --  2.5* 2.3*  --   INR  --   --   --  1.8* 1.7*  --  1.9* 2.9*  SARSCOV2NAA  --   --  POSITIVE*  --   --   --   --   --        ABG  No results found for: PHART, PCO2ART, PO2ART, HCO3, TCO2, ACIDBASEDEF, O2SAT       Condition - Extremely Guarded  Family Communication  : None at bedside  Code Status : Full  Consults  : None  Procedures  : Ultrasound paracentesis 3/12   Disposition Plan  :    Status is: Inpatient  Remains inpatient appropriate because:IV treatments appropriate due to intensity of illness or inability to take PO   Dispo: The patient is from: Home              Anticipated d/c is to: Home              Patient currently is not medically stable to d/c.   Difficult to place patient No      DVT Prophylaxis  :  SCDs   Lab Results  Component Value Date   PLT 58 (L) 02/10/2021    Diet :  Diet Order            Diet regular Room service appropriate? Yes; Fluid consistency: Thin; Fluid restriction: 1800 mL Fluid  Diet effective now                  Inpatient Medications  Scheduled Meds: . vitamin C  500 mg Oral Daily  . dicyclomine  10 mg Oral TID AC & HS  . furosemide  40 mg Oral Daily  . lactulose  30 g Oral TID  . rifaximin  550 mg Oral BID  . sodium bicarbonate  650 mg Oral BID  . spironolactone  100 mg Oral Daily  . Warfarin - Pharmacist Dosing Inpatient   Does not apply q1600  . zinc sulfate  220 mg Oral Daily   Continuous Infusions:  PRN Meds:.albuterol, guaiFENesin-dextromethorphan, hydrOXYzine  Antibiotics  :    Anti-infectives (From admission, onward)   Start     Dose/Rate Route Frequency Ordered Stop   02/08/21 1000  remdesivir 100 mg in sodium chloride 0.9 % 100 mL IVPB       "Followed by" Linked Group Details   100 mg 200 mL/hr over 30 Minutes Intravenous Daily 02/07/21 0058 02/09/21 1028   02/07/21 1200   cefTRIAXone (ROCEPHIN) 2 g in sodium chloride 0.9 % 100 mL IVPB  Status:  Discontinued        2 g 200 mL/hr over 30 Minutes Intravenous Every 24 hours 02/07/21 1151 02/07/21 1204   02/07/21 0130  remdesivir 200 mg in sodium chloride 0.9% 250 mL IVPB       "Followed by" Linked Group Details   200 mg 580 mL/hr over 30 Minutes Intravenous Once 02/07/21 0058 02/07/21 0421   02/06/21 2200  rifaximin (XIFAXAN) tablet 550 mg        550 mg Oral 2 times daily 02/06/21 1653          Dawood Elgergawy M.D on 02/10/2021 at 5:10 PM  To page go to [email or web address]   [facility] -  Office  [phone]      Objective:   Vitals:   02/10/21 0137 02/10/21 0528 02/10/21 0750 02/10/21 1500  BP: 116/72 111/60 121/68 114/69  Pulse: 76 68 68 70  Resp: 18 18 18 18   Temp: 98.4 F (36.9 C) 98.2 F (36.8 C) 98.1 F (36.7 C) 98.7 F (37.1 C)  TempSrc: Oral Oral Oral Oral  SpO2: 100% 100% 100% 100%  Weight:      Height:        Wt Readings from Last 3 Encounters:  02/09/21 93.4 kg  02/04/21 99.7 kg  01/29/21 100 kg     Intake/Output Summary (Last 24 hours) at 02/10/2021 1710 Last data filed at 02/09/2021 1754 Gross per 24 hour  Intake 100 ml  Output -  Net 100 ml     Physical Exam  Awake Alert, Oriented X 3, No new F.N deficits, Normal affect Symmetrical Chest wall movement, Good air movement bilaterally, CTAB RRR,No Gallops,Rubs or new Murmurs, No Parasternal Heave +ve B.Sounds, Abd Soft, nondistended, No rebound - guarding or rigidity. No Cyanosis, Clubbing or edema, No new Rash or bruise        Data Review:    CBC Recent Labs  Lab 02/04/21 1557 02/06/21 1341 02/08/21 0412 02/09/21 0248 02/10/21 0147  WBC 11.0* 9.4 5.8 5.3 5.3  HGB 13.0 12.6* 11.6*  11.0* 10.8*  HCT 34.5* 35.0* 31.6* 30.6* 30.8*  PLT 93* PLATELET CLUMPS NOTED ON SMEAR, COUNT APPEARS DECREASED 69* 61* 58*  MCV 91 95.4 96.3 97.1 97.8  MCH 34.1* 34.3* 35.4* 34.9* 34.3*  MCHC 37.7* 36.0 36.7* 35.9  35.1  RDW 14.3 14.8 15.5 15.2 15.3  LYMPHSABS  --   --  1.1 1.0 1.3  MONOABS  --   --  0.8 0.8 0.8  EOSABS  --   --  0.1 0.1 0.2  BASOSABS  --   --  0.1 0.0 0.0    Recent Labs  Lab 02/04/21 1557 02/06/21 1341 02/06/21 1535 02/06/21 1743 02/07/21 0222 02/07/21 0858 02/08/21 0412 02/09/21 0248 02/10/21 0147  NA 118* 118*  --   --  121* 124* 128* 130*  --   K 5.1 4.5  --   --  4.6 4.3 4.7 4.4  --   CL 86* 90*  --   --  93* 98 101 103  --   CO2 17* 21*  --   --  20* 19* 19* 22  --   GLUCOSE 115* 108*  --   --  92 141* 87 100*  --   BUN 10 14  --   --  14 12 14 15   --   CREATININE 0.82 1.06  --   --  0.75 0.79 0.84 0.88  --   CALCIUM 8.1* 7.9*  --   --  8.3* 8.5* 8.6* 8.3*  --   AST 181* 168*  --   --   --   --  124* 120*  --   ALT 61* 61*  --   --   --   --  44 39  --   ALKPHOS 420* 324*  --   --   --   --  262* 245*  --   BILITOT 13.5* 13.6*  --   --   --   --  11.8* 10.7*  --   ALBUMIN 2.7* 1.8*  --   --   --   --  2.5* 2.3*  --   MG 1.6  --   --   --   --   --  1.7 1.5* 1.7  DDIMER  --   --   --   --   --   --  8.41* 5.91*  --   INR  --   --   --  1.8* 1.7*  --   --  1.9* 2.9*  HGBA1C 4.3*  --   --   --   --   --   --   --   --   AMMONIA  --   --  71*  --   --   --   --   --   --     ------------------------------------------------------------------------------------------------------------------ No results for input(s): CHOL, HDL, LDLCALC, TRIG, CHOLHDL, LDLDIRECT in the last 72 hours.  Lab Results  Component Value Date   HGBA1C 4.3 (L) 02/04/2021   ------------------------------------------------------------------------------------------------------------------ No results for input(s): TSH, T4TOTAL, T3FREE, THYROIDAB in the last 72 hours.  Invalid input(s): FREET3  Cardiac Enzymes No results for input(s): CKMB, TROPONINI, MYOGLOBIN in the last 168 hours.  Invalid input(s):  CK ------------------------------------------------------------------------------------------------------------------ No results found for: BNP  Micro Results Recent Results (from the past 240 hour(s))  Resp Panel by RT-PCR (Flu A&B, Covid) Nasopharyngeal Swab     Status: Abnormal   Collection Time: 02/06/21  4:12 PM   Specimen: Nasopharyngeal Swab;  Nasopharyngeal(NP) swabs in vial transport medium  Result Value Ref Range Status   SARS Coronavirus 2 by RT PCR POSITIVE (A) NEGATIVE Final    Comment: RESULT CALLED TO, READ BACK BY AND VERIFIED WITH: Rick Duff RN 2024 02/06/21 A BROWNING (NOTE) SARS-CoV-2 target nucleic acids are DETECTED.  The SARS-CoV-2 RNA is generally detectable in upper respiratory specimens during the acute phase of infection. Positive results are indicative of the presence of the identified virus, but do not rule out bacterial infection or co-infection with other pathogens not detected by the test. Clinical correlation with patient history and other diagnostic information is necessary to determine patient infection status. The expected result is Negative.  Fact Sheet for Patients: EntrepreneurPulse.com.au  Fact Sheet for Healthcare Providers: IncredibleEmployment.be  This test is not yet approved or cleared by the [place] FDA and  has been authorized for detection and/or diagnosis of SARS-CoV-2 by FDA under an Emergency Use Authorization (EUA).  This EUA will remain in effect (meaning this test can b e used) for the duration of  the COVID-19 declaration under Section 564(b)(1) of the Act, 21 U.S.C. section 360bbb-3(b)(1), unless the authorization is terminated or revoked sooner.     Influenza A by PCR NEGATIVE NEGATIVE Final   Influenza B by PCR NEGATIVE NEGATIVE Final    Comment: (NOTE) The Xpert Xpress SARS-CoV-2/FLU/RSV plus assay is intended as an aid in the diagnosis of influenza from Nasopharyngeal swab  specimens and should not be used as a sole basis for treatment. Nasal washings and aspirates are unacceptable for Xpert Xpress SARS-CoV-2/FLU/RSV testing.  Fact Sheet for Patients: EntrepreneurPulse.com.au  Fact Sheet for Healthcare Providers: IncredibleEmployment.be  This test is not yet approved or cleared by the [place] FDA and has been authorized for detection and/or diagnosis of SARS-CoV-2 by FDA under an Emergency Use Authorization (EUA). This EUA will remain in effect (meaning this test can be used) for the duration of the COVID-19 declaration under Section 564(b)(1) of the Act, 21 U.S.C. section 360bbb-3(b)(1), unless the authorization is terminated or revoked.  Performed at [facility], [place] [address], [place], [place] [address]   Culture, blood (Routine X 2) w Reflex to ID Panel     Status: None (Preliminary result)   Collection Time: 02/07/21  2:25 AM   Specimen: BLOOD  Result Value Ref Range Status   Specimen Description BLOOD RIGHT HAND  Final   Special Requests   Final    BOTTLES DRAWN AEROBIC AND ANAEROBIC Blood Culture adequate volume   Culture   Final    NO GROWTH 3 DAYS Performed at [facility], [address], [place], [place] [address]    Report Status PENDING  Incomplete  Culture, blood (Routine X 2) w Reflex to ID Panel     Status: None (Preliminary result)   Collection Time: 02/07/21  2:33 AM   Specimen: BLOOD  Result Value Ref Range Status   Specimen Description BLOOD LEFT HAND  Final   Special Requests   Final    BOTTLES DRAWN AEROBIC AND ANAEROBIC Blood Culture results may not be optimal due to an inadequate volume of blood received in culture bottles   Culture   Final    NO GROWTH 3 DAYS Performed at [facility], [place] [address] [place], [place] [address]    Report Status PENDING  Incomplete  Gram stain     Status: None   Collection Time: 02/07/21  9:21 AM   Specimen: Abdomen;  Peritoneal Fluid  Result Value Ref Range Status   Specimen Description PERITONEAL FLUID  Final   Special Requests ABDOMEN  Final   Gram Stain   Final    CYTOSPIN SMEAR WBC PRESENT,BOTH PMN AND MONONUCLEAR NO ORGANISMS SEEN Performed at [facility], [address] [place], [place] [address]    Report Status 02/08/2021 FINAL  Final  Culture, body fluid w Gram Stain-bottle     Status: None (Preliminary result)   Collection Time: 02/07/21  9:21 AM   Specimen: Peritoneal Washings  Result Value Ref Range Status   Specimen Description PERITONEAL FLUID  Final   Special Requests ABDOMEN  Final   Culture   Final    NO GROWTH 3 DAYS Performed at [facility] [address] [place], [place] [address]    Report Status PENDING  Incomplete    Radiology Reports DG Chest 1 View  Result Date: 02/06/2021 CLINICAL DATA:  Dyspnea. EXAM: CHEST  1 VIEW COMPARISON:  12/26/2020 FINDINGS: Lower lung volumes from prior exam with mild bronchovascular crowding. Stable heart size and mediastinal contours. Streaky opacities at the left lung base favor atelectasis. Hazy opacity in the right costophrenic angle likely represents small effusion. No pneumothorax. Overlying monitoring devices project over the right chest. IMPRESSION: 1. Lower lung volumes from prior exam with bronchovascular crowding. 2. Hazy opacity in the right costophrenic angle likely small effusion. 3. Streaky left basilar opacities favor atelectasis. Electronically Signed   By: Keith Rake M.D.   On: 02/06/2021 17:43   US Paracentesis  Result Date: 02/07/2021 INDICATION: History of cirrhosis. Recurrent ascites. Request for diagnostic and therapeutic paracentesis. EXAM: ULTRASOUND GUIDED RIGHT LOWER QUADRANT PARACENTESIS MEDICATIONS: None. COMPLICATIONS: None immediate. PROCEDURE: Informed written consent was obtained from the patient after a discussion of the risks, benefits and alternatives to treatment. A timeout was performed  prior to the initiation of the procedure. Initial ultrasound scanning demonstrates a large amount of ascites within the right lower abdominal quadrant. The right lower abdomen was prepped and draped in the usual sterile fashion. 1% lidocaine was used for local anesthesia. Following this, a 19 gauge, 7-cm, Yueh catheter was introduced. An ultrasound image was saved for documentation purposes. The paracentesis was performed. The catheter was removed and a dressing was applied. The patient tolerated the procedure well without immediate post procedural complication. FINDINGS: A total of approximately 2.6 L of clear yellow fluid was removed. Samples were sent to the laboratory as requested by the clinical team. IMPRESSION: Successful ultrasound-guided paracentesis yielding 2.6 liters of peritoneal fluid. Read by: Ascencion Dike PA-C Electronically Signed   By: Jacqulynn Cadet M.D.   On: 02/07/2021 10:00   VAS [place] LOWER EXTREMITY VENOUS (DVT)  Result Date: 02/08/2021  Lower Venous DVT Study Other Indications: Covid. Risk Factors: None identified. Comparison Study: No previous Performing Technologist: Vonzell Schlatter RVT  Examination Guidelines: A complete evaluation includes B-mode imaging, spectral Doppler, color Doppler, and power Doppler as needed of all accessible portions of each vessel. Bilateral testing is considered an integral part of a complete examination. Limited examinations for reoccurring indications may be performed as noted. The reflux portion of the exam is performed with the patient in reverse Trendelenburg.  +---------+---------------+---------+-----------+----------+-----------------+ RIGHT    CompressibilityPhasicitySpontaneityPropertiesThrombus Aging    +---------+---------------+---------+-----------+----------+-----------------+ CFV      Full           Yes      Yes                                    +---------+---------------+---------+-----------+----------+-----------------+  SFJ       Full                                                           +---------+---------------+---------+-----------+----------+-----------------+ FV Prox  Full                                                           +---------+---------------+---------+-----------+----------+-----------------+ FV Mid   Full                                                           +---------+---------------+---------+-----------+----------+-----------------+ FV DistalFull                                                           +---------+---------------+---------+-----------+----------+-----------------+ PFV      Full                                                           +---------+---------------+---------+-----------+----------+-----------------+ POP      Full           Yes      Yes                                    +---------+---------------+---------+-----------+----------+-----------------+ PTV      Partial                                      Age Indeterminate +---------+---------------+---------+-----------+----------+-----------------+ PERO     Full                                                           +---------+---------------+---------+-----------+----------+-----------------+   +---------+---------------+---------+-----------+----------+--------------+ LEFT     CompressibilityPhasicitySpontaneityPropertiesThrombus Aging +---------+---------------+---------+-----------+----------+--------------+ CFV      Full           Yes      Yes                                 +---------+---------------+---------+-----------+----------+--------------+ SFJ      Full                                                        +---------+---------------+---------+-----------+----------+--------------+  FV Prox  Full                                                        +---------+---------------+---------+-----------+----------+--------------+ FV  Mid   Full                                                        +---------+---------------+---------+-----------+----------+--------------+ FV DistalFull                                                        +---------+---------------+---------+-----------+----------+--------------+ PFV      Full                                                        +---------+---------------+---------+-----------+----------+--------------+ POP      Full           Yes      Yes                                 +---------+---------------+---------+-----------+----------+--------------+ PTV      Full                                                        +---------+---------------+---------+-----------+----------+--------------+ PERO     Full                                                        +---------+---------------+---------+-----------+----------+--------------+  Summary: RIGHT: - Findings consistent with age indeterminate deep vein thrombosis involving a short segment right posterior tibial vein proximal calf. (one ptv) - No cystic structure found in the popliteal fossa.  LEFT: - There is no evidence of deep vein thrombosis in the lower extremity. - There is no evidence of superficial venous thrombosis.  - No cystic structure found in the popliteal fossa.  *See table(s) above for measurements and observations. Electronically signed by Harold Barban MD on 02/08/2021 at 9:08:22 PM.    Final    IR Paracentesis  Result Date: 01/30/2021 INDICATION: Patient with history of alcoholic cirrhosis, recurrent ascites. Request is made for diagnostic and therapeutic paracentesis. EXAM: ULTRASOUND GUIDED  PARACENTESIS MEDICATIONS: 10 mL 1 % lidocaine COMPLICATIONS: None immediate. PROCEDURE: Informed written consent was obtained from the patient after a discussion of the risks, benefits and alternatives to treatment. A timeout was performed prior to the initiation of the procedure. Initial  ultrasound scanning demonstrates a large amount of ascites within the right lower  abdominal quadrant. The right lower abdomen was prepped and draped in the usual sterile fashion. 1% lidocaine was used for local anesthesia. Following this, a 19 gauge, 7-cm, Yueh catheter was introduced. An ultrasound image was saved for documentation purposes. The paracentesis was performed. The catheter was removed and a dressing was applied. The patient tolerated the procedure well without immediate post procedural complication. FINDINGS: A total of approximately 2.4 L of clear yellow fluid was removed. IMPRESSION: Successful ultrasound-guided paracentesis yielding 2.4 liters of peritoneal fluid. Read by: Durenda Guthrie, PA-C Electronically Signed   By: Lucrezia Europe M.D.   On: 01/30/2021 14:14

## 2021-02-10 NOTE — Progress Notes (Signed)
Transported pt via hospital bed to [facility] room 21. Called tele to update that pt has been moved to [facility] room 21.

## 2021-02-11 LAB — CBC WITH DIFFERENTIAL/PLATELET
Abs Immature Granulocytes: 0.03 10*3/uL (ref 0.00–0.07)
Basophils Absolute: 0 10*3/uL (ref 0.0–0.1)
Basophils Relative: 1 %
Eosinophils Absolute: 0.2 10*3/uL (ref 0.0–0.5)
Eosinophils Relative: 3 %
HCT: 31 % — ABNORMAL LOW (ref 39.0–52.0)
Hemoglobin: 11.2 g/dL — ABNORMAL LOW (ref 13.0–17.0)
Immature Granulocytes: 1 %
Lymphocytes Relative: 22 %
Lymphs Abs: 1.1 10*3/uL (ref 0.7–4.0)
MCH: 35 pg — ABNORMAL HIGH (ref 26.0–34.0)
MCHC: 36.1 g/dL — ABNORMAL HIGH (ref 30.0–36.0)
MCV: 96.9 fL (ref 80.0–100.0)
Monocytes Absolute: 0.7 10*3/uL (ref 0.1–1.0)
Monocytes Relative: 12 %
Neutro Abs: 3.3 10*3/uL (ref 1.7–7.7)
Neutrophils Relative %: 61 %
Platelets: 58 10*3/uL — ABNORMAL LOW (ref 150–400)
RBC: 3.2 MIL/uL — ABNORMAL LOW (ref 4.22–5.81)
RDW: 15.3 % (ref 11.5–15.5)
WBC: 5.3 10*3/uL (ref 4.0–10.5)
nRBC: 0 % (ref 0.0–0.2)

## 2021-02-11 LAB — COMPREHENSIVE METABOLIC PANEL
ALT: 36 U/L (ref 0–44)
AST: 117 U/L — ABNORMAL HIGH (ref 15–41)
Albumin: 2.2 g/dL — ABNORMAL LOW (ref 3.5–5.0)
Alkaline Phosphatase: 248 U/L — ABNORMAL HIGH (ref 38–126)
Anion gap: 6 (ref 5–15)
BUN: 15 mg/dL (ref 6–20)
CO2: 21 mmol/L — ABNORMAL LOW (ref 22–32)
Calcium: 8.4 mg/dL — ABNORMAL LOW (ref 8.9–10.3)
Chloride: 102 mmol/L (ref 98–111)
Creatinine, Ser: 0.91 mg/dL (ref 0.61–1.24)
GFR, Estimated: >60 mL/min (ref >60)
Glucose, Bld: 97 mg/dL (ref 70–99)
Potassium: 4.6 mmol/L (ref 3.5–5.1)
Sodium: 129 mmol/L — ABNORMAL LOW (ref 135–145)
Total Bilirubin: 8.4 mg/dL — ABNORMAL HIGH (ref 0.3–1.2)
Total Protein: 5.2 g/dL — ABNORMAL LOW (ref 6.5–8.1)

## 2021-02-11 LAB — MAGNESIUM: Magnesium: 1.7 mg/dL (ref 1.7–2.4)

## 2021-02-11 LAB — PROTIME-INR
INR: 5 (ref 0.8–1.2)
Prothrombin Time: 44.8 seconds — ABNORMAL HIGH (ref 11.4–15.2)

## 2021-02-11 NOTE — Progress Notes (Signed)
HOSPITAL MEDICINE OVERNIGHT EVENT NOTE    Notified by nursing that INR is 5.0 today. Nursing reports no evidence of bleeding.    Holding all anticoagulation for today.   Ian Emerald  MD [facility]

## 2021-02-11 NOTE — Progress Notes (Signed)
PROGRESS NOTE        PATIENT DETAILS Name: Ian Hall Age: 55 y.o. Sex: male Date of Birth: 04/09/1966 Admit Date: 02/06/2021 Admitting Physician Vernelle Emerald, MD EUM:PNTIRW, Richard, NP  Brief Narrative: Patient is a 55 y.o. male with history of liver cirrhosis-presented to the hospital with generalized weakness-he was found to have severe hyponatremia, incidental COVID-19 infection and right leg DVT.  Significant events: 3/11>> admit for severe hyponatremia.  Significant studies: 3/11>> chest x-ray: No obvious pneumonia 3/13>> lower extremity Doppler: DVT in the right posterior tibial vein  Antimicrobial therapy: None  Microbiology data: None  Procedures : 3/12>> paracentesis  Consults: None  DVT Prophylaxis : SCDs Start: 02/06/21 1655   Subjective: Some right leg swelling-but no major issues overnight.   Assessment/Plan: Hyponatremia: Due to excessive free water intake-sodium still on the lower side but slowly improving.  Follow.    COVID-19 infection: Asymptomatic-completed 3 days of Remdesivir.  No further therapeutics required.  Right leg DVT: Provoked by COVID-19 infection-prior MD discussed with GI-on overlapping Lovenox/Coumadin-INR supratherapeutic today-we will defer further to pharmacy team.  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  Obesity: Estimated body mass index is 34.25 kg/m as calculated from the following:   Height as of this encounter: 5' 5"  (1.651 m).   Weight as of this encounter: 93.4 kg.    Diet: Diet Order            Diet regular Room service appropriate? Yes; Fluid consistency: Thin; Fluid restriction: 1800 mL Fluid  Diet effective now                  Code Status: Full code   Family Communication: None at bedside-we will update over the next few days.  Disposition  Plan: Status is: Inpatient  Remains inpatient appropriate because:Inpatient level of care appropriate due to severity of illness   Dispo: The patient is from: Home              Anticipated d/c is to: Home              Patient currently is not medically stable to d/c.   Difficult to place patient No    Barriers to Discharge: Hyponatremia/supratherapeutic INR-needs ongoing inpatient medication management before consideration of discharge  Antimicrobial agents: Anti-infectives (From admission, onward)   Start     Dose/Rate Route Frequency Ordered Stop   02/08/21 1000  remdesivir 100 mg in sodium chloride 0.9 % 100 mL IVPB       "Followed by" Linked Group Details   100 mg 200 mL/hr over 30 Minutes Intravenous Daily 02/07/21 0058 02/09/21 1028   02/07/21 1200  cefTRIAXone (ROCEPHIN) 2 g in sodium chloride 0.9 % 100 mL IVPB  Status:  Discontinued        2 g 200 mL/hr over 30 Minutes Intravenous Every 24 hours 02/07/21 1151 02/07/21 1204   02/07/21 0130  remdesivir 200 mg in sodium chloride 0.9% 250 mL IVPB       "Followed by" Linked Group Details   200 mg 580 mL/hr over 30 Minutes Intravenous Once 02/07/21 0058 02/07/21 0421   02/06/21 2200  rifaximin (XIFAXAN) tablet 550 mg        550 mg Oral 2 times daily 02/06/21 1653  Time spent: 25- minutes-Greater than 50% of this time was spent in counseling, explanation of diagnosis, planning of further management, and coordination of care.  MEDICATIONS: Scheduled Meds: . vitamin C  500 mg Oral Daily  . dicyclomine  10 mg Oral TID AC & HS  . furosemide  40 mg Oral Daily  . lactulose  30 g Oral TID  . rifaximin  550 mg Oral BID  . sodium bicarbonate  650 mg Oral BID  . spironolactone  100 mg Oral Daily  . Warfarin - Pharmacist Dosing Inpatient   Does not apply q1600  . zinc sulfate  220 mg Oral Daily   Continuous Infusions: PRN Meds:.albuterol, guaiFENesin-dextromethorphan, hydrOXYzine   PHYSICAL EXAM: Vital  signs: Vitals:   02/10/21 0750 02/10/21 1500 02/10/21 2200 02/11/21 0542  BP: 121/68 114/69 114/69 (!) 114/58  Pulse: 68 70 74 72  Resp: 18 18 16 16   Temp: 98.1 F (36.7 C) 98.7 F (37.1 C) 98.4 F (36.9 C) 97.6 F (36.4 C)  TempSrc: Oral Oral Oral Oral  SpO2: 100% 100% 99% 98%  Weight:      Height:       Filed Weights   02/07/21 2017 02/08/21 0408 02/09/21 0627  Weight: 98.5 kg 93.3 kg 93.4 kg   Body mass index is 34.25 kg/m.   Gen Exam:Alert awake-not in any distress HEENT:atraumatic, normocephalic Chest: B/L clear to auscultation anteriorly CVS:S1S2 regular Abdomen:soft non tender, non distended Extremities:no edema Neurology: Non focal Skin: no rash  I have personally reviewed following labs and imaging studies  LABORATORY DATA: CBC: Recent Labs  Lab 02/06/21 1341 02/08/21 0412 02/09/21 0248 02/10/21 0147 02/11/21 0125  WBC 9.4 5.8 5.3 5.3 5.3  NEUTROABS  --  3.8 3.3 3.0 3.3  HGB 12.6* 11.6* 11.0* 10.8* 11.2*  HCT 35.0* 31.6* 30.6* 30.8* 31.0*  MCV 95.4 96.3 97.1 97.8 96.9  PLT PLATELET CLUMPS NOTED ON SMEAR, COUNT APPEARS DECREASED 69* 61* 58* 58*    Basic Metabolic Panel: Recent Labs  Lab 02/04/21 1557 02/06/21 1341 02/07/21 0222 02/07/21 0858 02/08/21 0412 02/09/21 0248 02/10/21 0147 02/11/21 0125  NA 118*   < > 121* 124* 128* 130*  --  129*  K 5.1   < > 4.6 4.3 4.7 4.4  --  4.6  CL 86*   < > 93* 98 101 103  --  102  CO2 17*   < > 20* 19* 19* 22  --  21*  GLUCOSE 115*   < > 92 141* 87 100*  --  97  BUN 10   < > 14 12 14 15   --  15  CREATININE 0.82   < > 0.75 0.79 0.84 0.88  --  0.91  CALCIUM 8.1*   < > 8.3* 8.5* 8.6* 8.3*  --  8.4*  MG 1.6  --   --   --  1.7 1.5* 1.7 1.7   < > = values in this interval not displayed.    GFR: Estimated Creatinine Clearance: 97.5 mL/min (by C-G formula based on SCr of 0.91 mg/dL).  Liver Function Tests: Recent Labs  Lab 02/04/21 1557 02/06/21 1341 02/08/21 0412 02/09/21 0248 02/11/21 0125  AST  181* 168* 124* 120* 117*  ALT 61* 61* 44 39 36  ALKPHOS 420* 324* 262* 245* 248*  BILITOT 13.5* 13.6* 11.8* 10.7* 8.4*  PROT 5.6* 5.6* 5.4* 4.8* 5.2*  ALBUMIN 2.7* 1.8* 2.5* 2.3* 2.2*   Recent Labs  Lab 02/06/21 1341  LIPASE 61*  Recent Labs  Lab 02/06/21 1535  AMMONIA 71*    Coagulation Profile: Recent Labs  Lab 02/06/21 1743 02/07/21 0222 02/09/21 0248 02/10/21 0147 02/11/21 0125  INR 1.8* 1.7* 1.9* 2.9* 5.0*    Cardiac Enzymes: No results for input(s): CKTOTAL, CKMB, CKMBINDEX, TROPONINI in the last 168 hours.  BNP (last 3 results) No results for input(s): PROBNP in the last 8760 hours.  Lipid Profile: No results for input(s): CHOL, HDL, LDLCALC, TRIG, CHOLHDL, LDLDIRECT in the last 72 hours.  Thyroid Function Tests: No results for input(s): TSH, T4TOTAL, FREET4, T3FREE, THYROIDAB in the last 72 hours.  Anemia Panel: No results for input(s): VITAMINB12, FOLATE, FERRITIN, TIBC, IRON, RETICCTPCT in the last 72 hours.  Urine analysis:    Component Value Date/Time   COLORURINE AMBER (A) 02/06/2021 1524   APPEARANCEUR HAZY (A) 02/06/2021 1524   APPEARANCEUR Cloudy (A) 01/14/2021 1630   LABSPEC 1.015 02/06/2021 1524   PHURINE 5.0 02/06/2021 1524   GLUCOSEU NEGATIVE 02/06/2021 1524   HGBUR MODERATE (A) 02/06/2021 1524   BILIRUBINUR MODERATE (A) 02/06/2021 1524   BILIRUBINUR Negative 01/14/2021 1630   KETONESUR NEGATIVE 02/06/2021 1524   PROTEINUR NEGATIVE 02/06/2021 1524   UROBILINOGEN 1.0 10/17/2020 1627   NITRITE NEGATIVE 02/06/2021 1524   LEUKOCYTESUR NEGATIVE 02/06/2021 1524    Sepsis Labs: Lactic Acid, Venous No results found for: LATICACIDVEN  MICROBIOLOGY: Recent Results (from the past 240 hour(s))  Resp Panel by RT-PCR (Flu A&B, Covid) Nasopharyngeal Swab     Status: Abnormal   Collection Time: 02/06/21  4:12 PM   Specimen: Nasopharyngeal Swab; Nasopharyngeal(NP) swabs in vial transport medium  Result Value Ref Range Status   SARS  Coronavirus 2 by RT PCR POSITIVE (A) NEGATIVE Final    Comment: RESULT CALLED TO, READ BACK BY AND VERIFIED WITH: Rick Duff RN 2024 02/06/21 A BROWNING (NOTE) SARS-CoV-2 target nucleic acids are DETECTED.  The SARS-CoV-2 RNA is generally detectable in upper respiratory specimens during the acute phase of infection. Positive results are indicative of the presence of the identified virus, but do not rule out bacterial infection or co-infection with other pathogens not detected by the test. Clinical correlation with patient history and other diagnostic information is necessary to determine patient infection status. The expected result is Negative.  Fact Sheet for Patients: EntrepreneurPulse.com.au  Fact Sheet for Healthcare Providers: IncredibleEmployment.be  This test is not yet approved or cleared by the [place] FDA and  has been authorized for detection and/or diagnosis of SARS-CoV-2 by FDA under an Emergency Use Authorization (EUA).  This EUA will remain in effect (meaning this test can b e used) for the duration of  the COVID-19 declaration under Section 564(b)(1) of the Act, 21 U.S.C. section 360bbb-3(b)(1), unless the authorization is terminated or revoked sooner.     Influenza A by PCR NEGATIVE NEGATIVE Final   Influenza B by PCR NEGATIVE NEGATIVE Final    Comment: (NOTE) The Xpert Xpress SARS-CoV-2/FLU/RSV plus assay is intended as an aid in the diagnosis of influenza from Nasopharyngeal swab specimens and should not be used as a sole basis for treatment. Nasal washings and aspirates are unacceptable for Xpert Xpress SARS-CoV-2/FLU/RSV testing.  Fact Sheet for Patients: EntrepreneurPulse.com.au  Fact Sheet for Healthcare Providers: IncredibleEmployment.be  This test is not yet approved or cleared by the [place] FDA and has been authorized for detection and/or diagnosis of SARS-CoV-2  by FDA under an Emergency Use Authorization (EUA). This EUA will remain in effect (meaning this test can be used)  for the duration of the COVID-19 declaration under Section 564(b)(1) of the Act, 21 U.S.C. section 360bbb-3(b)(1), unless the authorization is terminated or revoked.  Performed at [facility], [place] [address] [place], [place] [address]   Culture, blood (Routine X 2) w Reflex to ID Panel     Status: None (Preliminary result)   Collection Time: 02/07/21  2:25 AM   Specimen: BLOOD  Result Value Ref Range Status   Specimen Description BLOOD RIGHT HAND  Final   Special Requests   Final    BOTTLES DRAWN AEROBIC AND ANAEROBIC Blood Culture adequate volume   Culture   Final    NO GROWTH 4 DAYS Performed at [facility], [place] [address], [place], [place] [address]    Report Status PENDING  Incomplete  Culture, blood (Routine X 2) w Reflex to ID Panel     Status: None (Preliminary result)   Collection Time: 02/07/21  2:33 AM   Specimen: BLOOD  Result Value Ref Range Status   Specimen Description BLOOD LEFT HAND  Final   Special Requests   Final    BOTTLES DRAWN AEROBIC AND ANAEROBIC Blood Culture results may not be optimal due to an inadequate volume of blood received in culture bottles   Culture   Final    NO GROWTH 4 DAYS Performed at [facility], [place] [address] [place], [place] [address]    Report Status PENDING  Incomplete  Gram stain     Status: None   Collection Time: 02/07/21  9:21 AM   Specimen: Abdomen; Peritoneal Fluid  Result Value Ref Range Status   Specimen Description PERITONEAL FLUID  Final   Special Requests ABDOMEN  Final   Gram Stain   Final    CYTOSPIN SMEAR WBC PRESENT,BOTH PMN AND MONONUCLEAR NO ORGANISMS SEEN Performed at [facility], [address] [place], [place] [address]    Report Status 02/08/2021 FINAL  Final  Culture, body fluid w Gram Stain-bottle     Status: None (Preliminary result)   Collection Time:  02/07/21  9:21 AM   Specimen: Peritoneal Washings  Result Value Ref Range Status   Specimen Description PERITONEAL FLUID  Final   Special Requests ABDOMEN  Final   Culture   Final    NO GROWTH 4 DAYS Performed at [facility] [address], [place], [place] [address]    Report Status PENDING  Incomplete    RADIOLOGY STUDIES/RESULTS: No results found.   LOS: 5 days   Oren Binet, MD  [facility]    To contact the attending provider between 7A-7P or the covering provider during after hours 7P-7A, please log into the web site [email or web address] and access using universal [place] password for that web site. If you do not have the password, please call the hospital operator.  02/11/2021, 12:05 PM

## 2021-02-11 NOTE — Progress Notes (Signed)
ANTICOAGULATION CONSULT NOTE - Follow Up Consult  Pharmacy Consult for Coumadin Indication: DVT  Allergies  Allergen Reactions  . Hydrocodone Itching and Other (See Comments)    Noted with hydrocodone cough syrup. Itching only, no hives.  02/03/16: denied itching with recent hydrocodone cough syrup. Only one episode of itching previously.     Patient Measurements: Height: 5\' 5"  (165.1 cm) Weight: 93.4 kg (205 lb 12.8 oz) IBW/kg (Calculated) : 61.5  Vital Signs: Temp: 97.9 F (36.6 C) (03/16 1302) Temp Source: Oral (03/16 1302) BP: 115/69 (03/16 1302) Pulse Rate: 69 (03/16 1302)  Labs: Recent Labs    02/09/21 0248 02/10/21 0147 02/11/21 0125  HGB 11.0* 10.8* 11.2*  HCT 30.6* 30.8* 31.0*  PLT 61* 58* 58*  LABPROT 21.4* 29.3* 44.8*  INR 1.9* 2.9* 5.0*  CREATININE 0.88  --  0.91    Estimated Creatinine Clearance: 97.5 mL/min (by C-G formula based on SCr of 0.91 mg/dL).     Assessment: Presented to ED with weakness, SOB, weightloss, and hyponatremia. Was diagnosed with new R LLE DVT now on warfarin (no DOACs due to severe hepatic impairment). Lovenox d/c 3/4 per MD. Ddimer 8.41>5.91.  INR=5 Hgb= 11.2, Plts=58   Goal of Therapy:  INR 2-3 Monitor platelets by anticoagulation protocol: Yes  Plan:  Hold coumadin today Daily INR  02/13/21  Pharm-D Candidate 02/11/2021,2:15 PM

## 2021-02-12 ENCOUNTER — Other Ambulatory Visit: Payer: Self-pay | Admitting: Internal Medicine

## 2021-02-12 ENCOUNTER — Telehealth: Payer: Self-pay | Admitting: Registered Nurse

## 2021-02-12 ENCOUNTER — Other Ambulatory Visit: Payer: Self-pay | Admitting: Registered Nurse

## 2021-02-12 DIAGNOSIS — I82451 Acute embolism and thrombosis of right peroneal vein: Secondary | ICD-10-CM

## 2021-02-12 DIAGNOSIS — I82441 Acute embolism and thrombosis of right tibial vein: Secondary | ICD-10-CM

## 2021-02-12 HISTORY — DX: Acute embolism and thrombosis of right tibial vein: I82.441

## 2021-02-12 LAB — CULTURE, BLOOD (ROUTINE X 2)
Culture: NO GROWTH
Culture: NO GROWTH
Special Requests: ADEQUATE

## 2021-02-12 LAB — CBC WITH DIFFERENTIAL/PLATELET
Abs Immature Granulocytes: 0.03 10*3/uL (ref 0.00–0.07)
Basophils Absolute: 0 10*3/uL (ref 0.0–0.1)
Basophils Relative: 1 %
Eosinophils Absolute: 0.1 10*3/uL (ref 0.0–0.5)
Eosinophils Relative: 3 %
HCT: 33.1 % — ABNORMAL LOW (ref 39.0–52.0)
Hemoglobin: 11.4 g/dL — ABNORMAL LOW (ref 13.0–17.0)
Immature Granulocytes: 1 %
Lymphocytes Relative: 23 %
Lymphs Abs: 1.2 10*3/uL (ref 0.7–4.0)
MCH: 34 pg (ref 26.0–34.0)
MCHC: 34.4 g/dL (ref 30.0–36.0)
MCV: 98.8 fL (ref 80.0–100.0)
Monocytes Absolute: 0.7 10*3/uL (ref 0.1–1.0)
Monocytes Relative: 13 %
Neutro Abs: 3 10*3/uL (ref 1.7–7.7)
Neutrophils Relative %: 59 %
Platelets: 55 10*3/uL — ABNORMAL LOW (ref 150–400)
RBC: 3.35 MIL/uL — ABNORMAL LOW (ref 4.22–5.81)
RDW: 15.2 % (ref 11.5–15.5)
WBC: 5 10*3/uL (ref 4.0–10.5)
nRBC: 0 % (ref 0.0–0.2)

## 2021-02-12 LAB — CULTURE, BODY FLUID W GRAM STAIN -BOTTLE: Culture: NO GROWTH

## 2021-02-12 LAB — PROTIME-INR
INR: 4.6 (ref 0.8–1.2)
Prothrombin Time: 42.2 seconds — ABNORMAL HIGH (ref 11.4–15.2)

## 2021-02-12 LAB — MAGNESIUM: Magnesium: 1.6 mg/dL — ABNORMAL LOW (ref 1.7–2.4)

## 2021-02-12 MED ORDER — WARFARIN SODIUM 2 MG PO TABS: 2.0000 mg | ORAL_TABLET | Freq: Every day | ORAL | 0 refills | Status: DC

## 2021-02-12 MED ORDER — MAGNESIUM SULFATE 4 GM/100ML IV SOLN
4.0000 g | Freq: Once | INTRAVENOUS | Status: AC
  Administered 2021-02-12: 4 g via INTRAVENOUS
  Filled 2021-02-12: qty 100

## 2021-02-12 NOTE — Telephone Encounter (Signed)
Please advise on previous message.  "Received call from Dr. Sloan Leiter to follow up on patient: he stated that the patient needs an INR check tomorrow, 3/18. Dr. Nena Alexander cell is [phone]. Please advise him if additional information needed."

## 2021-02-12 NOTE — Plan of Care (Signed)
Pt ate 100% of dinner. Continues on room air. Pt reports 4/10 L abd pain with acitivty. Declined pain med interventions.   Problem: Education: Goal: Knowledge of General Education information will improve Description: Including pain rating scale, medication(s)/side effects and non-pharmacologic comfort measures Outcome: Progressing   Problem: Health Behavior/Discharge Planning: Goal: Ability to manage health-related needs will improve Outcome: Progressing   Problem: Clinical Measurements: Goal: Ability to maintain clinical measurements within normal limits will improve Outcome: Progressing Goal: Will remain free from infection Outcome: Progressing Goal: Diagnostic test results will improve Outcome: Progressing Goal: Respiratory complications will improve Outcome: Progressing Goal: Cardiovascular complication will be avoided Outcome: Progressing   Problem: Activity: Goal: Risk for activity intolerance will decrease Outcome: Progressing   Problem: Nutrition: Goal: Adequate nutrition will be maintained Outcome: Progressing   Problem: Coping: Goal: Level of anxiety will decrease Outcome: Progressing   Problem: Elimination: Goal: Will not experience complications related to bowel motility Outcome: Progressing Goal: Will not experience complications related to urinary retention Outcome: Progressing   Problem: Pain Managment: Goal: General experience of comfort will improve Outcome: Progressing   Problem: Safety: Goal: Ability to remain free from injury will improve Outcome: Progressing   Problem: Skin Integrity: Goal: Risk for impaired skin integrity will decrease Outcome: Progressing   Problem: Education: Goal: Knowledge of Pancreatitis treatment and prevention will improve Outcome: Progressing   Problem: Health Behavior/Discharge Planning: Goal: Ability to formulate a plan to maintain an alcohol-free life will improve Outcome: Progressing   Problem:  Nutritional: Goal: Ability to achieve adequate nutritional intake will improve Outcome: Progressing   Problem: Clinical Measurements: Goal: Complications related to the disease process, condition or treatment will be avoided or minimized Outcome: Progressing   Problem: Education: Goal: Knowledge of risk factors and measures for prevention of condition will improve Outcome: Progressing   Problem: Coping: Goal: Psychosocial and spiritual needs will be supported Outcome: Progressing   Problem: Respiratory: Goal: Will maintain a patent airway Outcome: Progressing Goal: Complications related to the disease process, condition or treatment will be avoided or minimized Outcome: Progressing

## 2021-02-12 NOTE — Plan of Care (Signed)
  Problem: Education: Goal: Knowledge of General Education information will improve Description: Including pain rating scale, medication(s)/side effects and non-pharmacologic comfort measures Outcome: Adequate for Discharge   Problem: Health Behavior/Discharge Planning: Goal: Ability to manage health-related needs will improve Outcome: Adequate for Discharge   Problem: Clinical Measurements: Goal: Ability to maintain clinical measurements within normal limits will improve Outcome: Adequate for Discharge Goal: Will remain free from infection Outcome: Adequate for Discharge Goal: Diagnostic test results will improve Outcome: Adequate for Discharge Goal: Respiratory complications will improve Outcome: Adequate for Discharge Goal: Cardiovascular complication will be avoided Outcome: Adequate for Discharge   Problem: Activity: Goal: Risk for activity intolerance will decrease Outcome: Adequate for Discharge   Problem: Nutrition: Goal: Adequate nutrition will be maintained Outcome: Adequate for Discharge   Problem: Coping: Goal: Level of anxiety will decrease Outcome: Adequate for Discharge   Problem: Elimination: Goal: Will not experience complications related to bowel motility Outcome: Adequate for Discharge Goal: Will not experience complications related to urinary retention Outcome: Adequate for Discharge   Problem: Pain Managment: Goal: General experience of comfort will improve Outcome: Adequate for Discharge   Problem: Safety: Goal: Ability to remain free from injury will improve Outcome: Adequate for Discharge   Problem: Skin Integrity: Goal: Risk for impaired skin integrity will decrease Outcome: Adequate for Discharge   Problem: Education: Goal: Knowledge of Pancreatitis treatment and prevention will improve Outcome: Adequate for Discharge   Problem: Health Behavior/Discharge Planning: Goal: Ability to formulate a plan to maintain an alcohol-free life will  improve Outcome: Adequate for Discharge   Problem: Nutritional: Goal: Ability to achieve adequate nutritional intake will improve Outcome: Adequate for Discharge   Problem: Clinical Measurements: Goal: Complications related to the disease process, condition or treatment will be avoided or minimized Outcome: Adequate for Discharge   Problem: Education: Goal: Knowledge of risk factors and measures for prevention of condition will improve Outcome: Adequate for Discharge   Problem: Coping: Goal: Psychosocial and spiritual needs will be supported Outcome: Adequate for Discharge   Problem: Respiratory: Goal: Will maintain a patent airway Outcome: Adequate for Discharge Goal: Complications related to the disease process, condition or treatment will be avoided or minimized Outcome: Adequate for Discharge

## 2021-02-12 NOTE — Telephone Encounter (Signed)
Received call from patient. Patient stated provider sent him to the ED; patient advised by provider at ED to have [facility] repeat the same blood tests he received last time.   ED physician stated blood work must be completed Friday, 02/13/21. Patient wants to go to the lab on [facility]. Please advise patient when orders are placed at [phone].

## 2021-02-12 NOTE — Telephone Encounter (Signed)
Received call from Dr. Sloan Leiter to follow up on patient: he stated that the patient needs an INR check tomorrow, 3/18. Dr. Nena Alexander cell is [phone]. Please advise him if additional information needed.

## 2021-02-12 NOTE — Progress Notes (Signed)
Received report from Amy, RN. VS taken, skin verified with Darrold Span, pt oriented to room and call bell.

## 2021-02-12 NOTE — Discharge Summary (Signed)
PATIENT DETAILS Name: Ian Hall Age: 55 y.o. Sex: male Date of Birth: 11-26-1966 MRN: [id number]. Admitting Physician: Vernelle Emerald, MD GTX:[id number], Delfino Lovett, NP  Admit Date: 02/06/2021 Discharge date: 02/12/2021  Recommendations for Outpatient Follow-up:  1. Follow up with PCP in 1-2 weeks 2. Please obtain CMP/CBC in one week 3. Please check INR on 2/18-and adjust Coumadin dose accordingly.  Admitted From:  Home  Disposition: [place]: No  Equipment/Devices: None  Discharge Condition: Stable  CODE STATUS: FULL CODE  Diet recommendation:  Diet Order            Diet - low sodium heart healthy           Diet regular Room service appropriate? Yes; Fluid consistency: Thin; Fluid restriction: 1800 mL Fluid  Diet effective now                  Brief Narrative: Patient is a 55 y.o. male with history of liver cirrhosis-presented to the hospital with generalized weakness-he was found to have severe hyponatremia, incidental COVID-19 infection and right leg DVT.  Significant events: 3/11>> admit for severe hyponatremia.  Significant studies: 3/11>> chest x-ray: No obvious pneumonia 3/13>> lower extremity Doppler: DVT in the right posterior tibial vein  Antimicrobial therapy: None  Microbiology data: None  Procedures : 3/12>> paracentesis  Consults: None   Brief Hospital Course: Hyponatremia: Due to excessive free water intake and underlying liver cirrhosis-sodium still on the lower side but slowly improving.  Continue fluid restriction-on Lasix/Aldactone-continue outpatient monitoring-repeat chemistry panel in 1 week.  COVID-19 infection: Asymptomatic-completed 3 days of Remdesivir.  No further therapeutics required.  Right leg DVT: Provoked by COVID-19 infection-prior MD discussed with GI-initially on Lovenox/Coumadin-however INR rapidly increased-has had Coumadin held for the past few days-patient is very anxious to go home  today-he has no evidence of any bleeding.  He is in the process of calling his PCPs office to schedule for a INR check tomorrow-further dosing of Coumadin will be deferred to patient's primary care practitioner.  He has been counseled extensively regarding the importance of seeking immediate medical attention if he has bloody/black stools or blood in his vomitus.  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  Continue to watch closely as patient has now been started on Coumadin for DVT.  Obesity: Estimated body mass index is 34.25 kg/m as calculated from the following:   Height as of this encounter: 5' 5"  (1.651 m).   Weight as of this encounter: 93.4 kg.    Discharge Diagnoses:  Active Problems:   Hyponatremia   Discharge Instructions:  Activity:  As tolerated  Discharge Instructions    Diet - low sodium heart healthy   Complete by: As directed    1500 cc of fluid restriction.   Discharge instructions   Complete by: As directed    1.)  Fluid restriction of 1500 cc a day  2.)  Get your INR checked at your primary care practitioFollow with Primary MD  Maximiano Coss, NP in 1-2 weeks  Please get a complete blood count and chemistry panel checked by your Primary MD at your next visit, and again as instructed by your Primary MD.  Get Medicines reviewed and adjusted: Please take all your medications with you for your next visit with your Primary MD  Laboratory/radiological data: Please request your Primary MD to go over all hospital tests and procedure/radiological results at the follow up, please  ask your Primary MD to get all Hospital records sent to his/her office.  In some cases, they will be blood work, cultures and biopsy results pending at the time of your discharge. Please request that your primary care M.D. follows up on these results.  Also Note the  following: If you experience worsening of your admission symptoms, develop shortness of breath, life threatening emergency, suicidal or homicidal thoughts you must seek medical attention immediately by calling 911 or calling your MD immediately  if symptoms less severe.  You must read complete instructions/literature along with all the possible adverse reactions/side effects for all the Medicines you take and that have been prescribed to you. Take any new Medicines after you have completely understood and accpet all the possible adverse reactions/side effects.   Do not drive when taking Pain medications or sleeping medications (Benzodaizepines)  Do not take more than prescribed Pain, Sleep and Anxiety Medications. It is not advisable to combine anxiety,sleep and pain medications without talking with your primary care practitioner  Special Instructions: If you have smoked or chewed Tobacco  in the last 2 yrs please stop smoking, stop any regular Alcohol  and or any Recreational drug use.  Wear Seat belts while driving.  Please note: You were cared for by a hospitalist during your hospital stay. Once you are discharged, your primary care physician will handle any further medical issues. Please note that NO REFILLS for any discharge medications will be authorized once you are discharged, as it is imperative that you return to your primary care physician (or establish a relationship with a primary care physician if you do not have one) for your post hospital discharge needs so that they can reassess your need for medications and monitor your lab values.    ner's office on 3/18-start taking Coumadin ONLY after your INR check and after you talk with primary care practitioner   Increase activity slowly   Complete by: As directed    No dressing needed   Complete by: As directed      Allergies as of 02/12/2021      Reactions   Hydrocodone Itching, Other (See Comments)   Noted with hydrocodone cough  syrup. Itching only, no hives.  02/03/16: denied itching with recent hydrocodone cough syrup. Only one episode of itching previously.       Medication List    TAKE these medications   dicyclomine 10 MG capsule Commonly known as: BENTYL Take 1 capsule (10 mg total) by mouth 4 (four) times daily -  before meals and at bedtime.   folic acid 1 MG tablet Commonly known as: FOLVITE Take 1 tablet (1 mg total) by mouth daily.   furosemide 40 MG tablet Commonly known as: Lasix Take 1 tablet (40 mg total) by mouth daily.   hydrOXYzine 10 MG tablet Commonly known as: ATARAX/VISTARIL Take 0.5-1 tablets (5-10 mg total) by mouth at bedtime as needed for itching.   lactulose 10 g packet Commonly known as: CEPHULAC Take 3 packets (30 g total) by mouth 3 (three) times daily.   rifaximin 550 MG Tabs tablet Commonly known as: XIFAXAN Take 1 tablet (550 mg total) by mouth 2 (two) times daily.   spironolactone 100 MG tablet Commonly known as: Aldactone Take 1 tablet (100 mg total) by mouth daily.   thiamine 100 MG tablet Take 1 tablet (100 mg total) by mouth daily.   warfarin 2 MG tablet Commonly known as: Coumadin Take 1 tablet (2 mg total) by mouth daily.  Start after you talk with your PCP/after your INR has been checked Start taking on: February 13, 2021            Discharge Care Instructions  (From admission, onward)         Start     Ordered   02/12/21 0000  No dressing needed        02/12/21 0936          Follow-up Information    Department of Social Services. Call.   Why: To request a Medicaid application  Contact information: [place], [place]       [place], [place] Follow up.   Specialty: Nurse Practitioner Why: Please keep your follow-up appointment on 02/17/2021 at 10:30 AM. Contact information: [address] [place] [address] [phone]        Maximiano Coss, NP Follow up on 02/13/2021.   Specialty: Adult Health  Nurse Practitioner Why: Office will call-for blood work (INR) check.  Please discuss with your primary care practitioner-regarding further dosing of Coumadin.  Do not take Coumadin unless you talk with your primary care practitioner. Contact information: [place] [address] [phone]              Allergies  Allergen Reactions  . Hydrocodone Itching and Other (See Comments)    Noted with hydrocodone cough syrup. Itching only, no hives.  02/03/16: denied itching with recent hydrocodone cough syrup. Only one episode of itching previously.      Other Procedures/Studies: DG Chest 1 View  Result Date: 02/06/2021 CLINICAL DATA:  Dyspnea. EXAM: CHEST  1 VIEW COMPARISON:  12/26/2020 FINDINGS: Lower lung volumes from prior exam with mild bronchovascular crowding. Stable heart size and mediastinal contours. Streaky opacities at the left lung base favor atelectasis. Hazy opacity in the right costophrenic angle likely represents small effusion. No pneumothorax. Overlying monitoring devices project over the right chest. IMPRESSION: 1. Lower lung volumes from prior exam with bronchovascular crowding. 2. Hazy opacity in the right costophrenic angle likely small effusion. 3. Streaky left basilar opacities favor atelectasis. Electronically Signed   By: Keith Rake M.D.   On: 02/06/2021 17:43   US Paracentesis  Result Date: 02/07/2021 INDICATION: History of cirrhosis. Recurrent ascites. Request for diagnostic and therapeutic paracentesis. EXAM: ULTRASOUND GUIDED RIGHT LOWER QUADRANT PARACENTESIS MEDICATIONS: None. COMPLICATIONS: None immediate. PROCEDURE: Informed written consent was obtained from the patient after a discussion of the risks, benefits and alternatives to treatment. A timeout was performed prior to the initiation of the procedure. Initial ultrasound scanning demonstrates a large amount of ascites within the right lower abdominal quadrant. The right lower abdomen was  prepped and draped in the usual sterile fashion. 1% lidocaine was used for local anesthesia. Following this, a 19 gauge, 7-cm, Yueh catheter was introduced. An ultrasound image was saved for documentation purposes. The paracentesis was performed. The catheter was removed and a dressing was applied. The patient tolerated the procedure well without immediate post procedural complication. FINDINGS: A total of approximately 2.6 L of clear yellow fluid was removed. Samples were sent to the laboratory as requested by the clinical team. IMPRESSION: Successful ultrasound-guided paracentesis yielding 2.6 liters of peritoneal fluid. Read by: Ascencion Dike PA-C Electronically Signed   By: Jacqulynn Cadet M.D.   On: 02/07/2021 10:00   VAS [place] LOWER EXTREMITY VENOUS (DVT)  Result Date: 02/08/2021  Lower Venous DVT Study Other Indications: Covid. Risk Factors: None identified. Comparison Study: No previous Performing Technologist: Lattie Haw  Noberto Retort RVT  Examination Guidelines: A complete evaluation includes B-mode imaging, spectral Doppler, color Doppler, and power Doppler as needed of all accessible portions of each vessel. Bilateral testing is considered an integral part of a complete examination. Limited examinations for reoccurring indications may be performed as noted. The reflux portion of the exam is performed with the patient in reverse Trendelenburg.  +---------+---------------+---------+-----------+----------+-----------------+ RIGHT    CompressibilityPhasicitySpontaneityPropertiesThrombus Aging    +---------+---------------+---------+-----------+----------+-----------------+ CFV      Full           Yes      Yes                                    +---------+---------------+---------+-----------+----------+-----------------+ SFJ      Full                                                           +---------+---------------+---------+-----------+----------+-----------------+ FV Prox  Full                                                            +---------+---------------+---------+-----------+----------+-----------------+ FV Mid   Full                                                           +---------+---------------+---------+-----------+----------+-----------------+ FV DistalFull                                                           +---------+---------------+---------+-----------+----------+-----------------+ PFV      Full                                                           +---------+---------------+---------+-----------+----------+-----------------+ POP      Full           Yes      Yes                                    +---------+---------------+---------+-----------+----------+-----------------+ PTV      Partial                                      Age Indeterminate +---------+---------------+---------+-----------+----------+-----------------+ PERO     Full                                                           +---------+---------------+---------+-----------+----------+-----------------+   +---------+---------------+---------+-----------+----------+--------------+  LEFT     CompressibilityPhasicitySpontaneityPropertiesThrombus Aging +---------+---------------+---------+-----------+----------+--------------+ CFV      Full           Yes      Yes                                 +---------+---------------+---------+-----------+----------+--------------+ SFJ      Full                                                        +---------+---------------+---------+-----------+----------+--------------+ FV Prox  Full                                                        +---------+---------------+---------+-----------+----------+--------------+ FV Mid   Full                                                        +---------+---------------+---------+-----------+----------+--------------+ FV DistalFull                                                         +---------+---------------+---------+-----------+----------+--------------+ PFV      Full                                                        +---------+---------------+---------+-----------+----------+--------------+ POP      Full           Yes      Yes                                 +---------+---------------+---------+-----------+----------+--------------+ PTV      Full                                                        +---------+---------------+---------+-----------+----------+--------------+ PERO     Full                                                        +---------+---------------+---------+-----------+----------+--------------+  Summary: RIGHT: - Findings consistent with age indeterminate deep vein thrombosis involving a short segment right posterior tibial vein proximal calf. (one ptv) - No cystic structure found in the popliteal fossa.  LEFT: - There is no evidence of deep vein thrombosis in the lower  extremity. - There is no evidence of superficial venous thrombosis.  - No cystic structure found in the popliteal fossa.  *See table(s) above for measurements and observations. Electronically signed by Harold Barban MD on 02/08/2021 at 9:08:22 PM.    Final    IR Paracentesis  Result Date: 01/30/2021 INDICATION: Patient with history of alcoholic cirrhosis, recurrent ascites. Request is made for diagnostic and therapeutic paracentesis. EXAM: ULTRASOUND GUIDED  PARACENTESIS MEDICATIONS: 10 mL 1 % lidocaine COMPLICATIONS: None immediate. PROCEDURE: Informed written consent was obtained from the patient after a discussion of the risks, benefits and alternatives to treatment. A timeout was performed prior to the initiation of the procedure. Initial ultrasound scanning demonstrates a large amount of ascites within the right lower abdominal quadrant. The right lower abdomen was prepped and draped in the usual sterile fashion. 1%  lidocaine was used for local anesthesia. Following this, a 19 gauge, 7-cm, Yueh catheter was introduced. An ultrasound image was saved for documentation purposes. The paracentesis was performed. The catheter was removed and a dressing was applied. The patient tolerated the procedure well without immediate post procedural complication. FINDINGS: A total of approximately 2.4 L of clear yellow fluid was removed. IMPRESSION: Successful ultrasound-guided paracentesis yielding 2.4 liters of peritoneal fluid. Read by: Durenda Guthrie, PA-C Electronically Signed   By: Lucrezia Europe M.D.   On: 01/30/2021 14:14     TODAY-DAY OF DISCHARGE:  Subjective:   Kamdon Reisig today has no headache,no chest abdominal pain,no new weakness tingling or numbness, feels much better wants to go home today  Objective:   Blood pressure 105/63, pulse 75, temperature 98.9 F (37.2 C), temperature source Oral, resp. rate 16, height 5' 5"  (1.651 m), weight 93.4 kg, SpO2 99 %.  Intake/Output Summary (Last 24 hours) at 02/12/2021 0937 Last data filed at 02/11/2021 2300 Gross per 24 hour  Intake 300 ml  Output --  Net 300 ml   Filed Weights   02/07/21 2017 02/08/21 0408 02/09/21 0627  Weight: 98.5 kg 93.3 kg 93.4 kg    Exam: Awake Alert, Oriented *3, No new F.N deficits, Normal affect [place].AT,PERRAL Supple Neck,No JVD, No cervical lymphadenopathy appriciated.  Symmetrical Chest wall movement, Good air movement bilaterally, CTAB RRR,No Gallops,Rubs or new Murmurs, No Parasternal Heave +ve B.Sounds, Abd Soft, Non tender, No organomegaly appriciated, No rebound -guarding or rigidity. No Cyanosis, Clubbing or edema, No new Rash or bruise   PERTINENT RADIOLOGIC STUDIES: No results found.   PERTINENT LAB RESULTS: CBC: Recent Labs    02/11/21 0125 02/12/21 0347  WBC 5.3 5.0  HGB 11.2* 11.4*  HCT 31.0* 33.1*  PLT 58* 55*   CMET CMP     Component Value Date/Time   NA 129 (L) 02/11/2021 0125   NA 118 (LL) 02/04/2021  1557   K 4.6 02/11/2021 0125   CL 102 02/11/2021 0125   CO2 21 (L) 02/11/2021 0125   GLUCOSE 97 02/11/2021 0125   BUN 15 02/11/2021 0125   BUN 10 02/04/2021 1557   CREATININE 0.91 02/11/2021 0125   CREATININE 0.86 10/29/2020 1344   CREATININE 1.01 01/01/2016 1119   CALCIUM 8.4 (L) 02/11/2021 0125   PROT 5.2 (L) 02/11/2021 0125   PROT 5.6 (L) 02/04/2021 1557   ALBUMIN 2.2 (L) 02/11/2021 0125   ALBUMIN 2.7 (L) 02/04/2021 1557   AST 117 (H) 02/11/2021 0125   AST 116 (H) 10/29/2020 1344   ALT 36 02/11/2021 0125   ALT 37 10/29/2020 1344   ALKPHOS 248 (H) 02/11/2021 0125  BILITOT 8.4 (H) 02/11/2021 0125   BILITOT 13.5 (HH) 02/04/2021 1557   BILITOT 5.4 (HH) 10/29/2020 1344   GFRNONAA >60 02/11/2021 0125   GFRNONAA >60 10/29/2020 1344   GFRAA 112 01/14/2021 1643    GFR Estimated Creatinine Clearance: 97.5 mL/min (by C-G formula based on SCr of 0.91 mg/dL). No results for input(s): LIPASE, AMYLASE in the last 72 hours. No results for input(s): CKTOTAL, CKMB, CKMBINDEX, TROPONINI in the last 72 hours. Invalid input(s): POCBNP No results for input(s): DDIMER in the last 72 hours. No results for input(s): HGBA1C in the last 72 hours. No results for input(s): CHOL, HDL, LDLCALC, TRIG, CHOLHDL, LDLDIRECT in the last 72 hours. No results for input(s): TSH, T4TOTAL, T3FREE, THYROIDAB in the last 72 hours.  Invalid input(s): FREET3 No results for input(s): VITAMINB12, FOLATE, FERRITIN, TIBC, IRON, RETICCTPCT in the last 72 hours. Coags: Recent Labs    02/11/21 0125 02/12/21 0347  INR 5.0* 4.6*   Microbiology: Recent Results (from the past 240 hour(s))  Resp Panel by RT-PCR (Flu A&B, Covid) Nasopharyngeal Swab     Status: Abnormal   Collection Time: 02/06/21  4:12 PM   Specimen: Nasopharyngeal Swab; Nasopharyngeal(NP) swabs in vial transport medium  Result Value Ref Range Status   SARS Coronavirus 2 by RT PCR POSITIVE (A) NEGATIVE Final    Comment: RESULT CALLED TO, READ BACK BY  AND VERIFIED WITH: Rick Duff RN 2024 02/06/21 A BROWNING (NOTE) SARS-CoV-2 target nucleic acids are DETECTED.  The SARS-CoV-2 RNA is generally detectable in upper respiratory specimens during the acute phase of infection. Positive results are indicative of the presence of the identified virus, but do not rule out bacterial infection or co-infection with other pathogens not detected by the test. Clinical correlation with patient history and other diagnostic information is necessary to determine patient infection status. The expected result is Negative.  Fact Sheet for Patients: EntrepreneurPulse.com.au  Fact Sheet for Healthcare Providers: IncredibleEmployment.be  This test is not yet approved or cleared by the [place] FDA and  has been authorized for detection and/or diagnosis of SARS-CoV-2 by FDA under an Emergency Use Authorization (EUA).  This EUA will remain in effect (meaning this test can b e used) for the duration of  the COVID-19 declaration under Section 564(b)(1) of the Act, 21 U.S.C. section 360bbb-3(b)(1), unless the authorization is terminated or revoked sooner.     Influenza A by PCR NEGATIVE NEGATIVE Final   Influenza B by PCR NEGATIVE NEGATIVE Final    Comment: (NOTE) The Xpert Xpress SARS-CoV-2/FLU/RSV plus assay is intended as an aid in the diagnosis of influenza from Nasopharyngeal swab specimens and should not be used as a sole basis for treatment. Nasal washings and aspirates are unacceptable for Xpert Xpress SARS-CoV-2/FLU/RSV testing.  Fact Sheet for Patients: EntrepreneurPulse.com.au  Fact Sheet for Healthcare Providers: IncredibleEmployment.be  This test is not yet approved or cleared by the [place] FDA and has been authorized for detection and/or diagnosis of SARS-CoV-2 by FDA under an Emergency Use Authorization (EUA). This EUA will remain in effect (meaning this  test can be used) for the duration of the COVID-19 declaration under Section 564(b)(1) of the Act, 21 U.S.C. section 360bbb-3(b)(1), unless the authorization is terminated or revoked.  Performed at [facility], [place] [address], [place], [place] [address]   Culture, blood (Routine X 2) w Reflex to ID Panel     Status: None   Collection Time: 02/07/21  2:25 AM   Specimen: BLOOD  Result Value Ref Range Status   Specimen Description BLOOD RIGHT HAND  Final   Special Requests   Final    BOTTLES DRAWN AEROBIC AND ANAEROBIC Blood Culture adequate volume   Culture   Final    NO GROWTH 5 DAYS Performed at [facility], [address], [place], [place] [address]    Report Status 02/12/2021 FINAL  Final  Culture, blood (Routine X 2) w Reflex to ID Panel     Status: None   Collection Time: 02/07/21  2:33 AM   Specimen: BLOOD  Result Value Ref Range Status   Specimen Description BLOOD LEFT HAND  Final   Special Requests   Final    BOTTLES DRAWN AEROBIC AND ANAEROBIC Blood Culture results may not be optimal due to an inadequate volume of blood received in culture bottles   Culture   Final    NO GROWTH 5 DAYS Performed at [facility], [place] [address] [place], [place] [address]    Report Status 02/12/2021 FINAL  Final  Gram stain     Status: None   Collection Time: 02/07/21  9:21 AM   Specimen: Abdomen; Peritoneal Fluid  Result Value Ref Range Status   Specimen Description PERITONEAL FLUID  Final   Special Requests ABDOMEN  Final   Gram Stain   Final    CYTOSPIN SMEAR WBC PRESENT,BOTH PMN AND MONONUCLEAR NO ORGANISMS SEEN Performed at [facility], [place] [address] [place], [place] [address]    Report Status 02/08/2021 FINAL  Final  Culture, body fluid w Gram Stain-bottle     Status: None   Collection Time: 02/07/21  9:21 AM   Specimen: Peritoneal Washings  Result Value Ref Range Status   Specimen Description PERITONEAL FLUID  Final   Special Requests ABDOMEN   Final   Culture   Final    NO GROWTH 5 DAYS Performed at [place] [address], [place], [place] [address]    Report Status 02/12/2021 FINAL  Final    FURTHER DISCHARGE INSTRUCTIONS:  Get Medicines reviewed and adjusted: Please take all your medications with you for your next visit with your Primary MD  Laboratory/radiological data: Please request your Primary MD to go over all hospital tests and procedure/radiological results at the follow up, please ask your Primary MD to get all Hospital records sent to his/her office.  In some cases, they will be blood work, cultures and biopsy results pending at the time of your discharge. Please request that your primary care M.D. goes through all the records of your hospital data and follows up on these results.  Also Note the following: If you experience worsening of your admission symptoms, develop shortness of breath, life threatening emergency, suicidal or homicidal thoughts you must seek medical attention immediately by calling 911 or calling your MD immediately  if symptoms less severe.  You must read complete instructions/literature along with all the possible adverse reactions/side effects for all the Medicines you take and that have been prescribed to you. Take any new Medicines after you have completely understood and accpet all the possible adverse reactions/side effects.   Do not drive when taking Pain medications or sleeping medications (Benzodaizepines)  Do not take more than prescribed Pain, Sleep and Anxiety Medications. It is not advisable to combine anxiety,sleep and pain medications without talking with your primary care practitioner  Special Instructions: If you have smoked or chewed Tobacco  in the last 2 yrs please stop  smoking, stop any regular Alcohol  and or any Recreational drug use.  Wear Seat belts while driving.  Please note: You were cared for by a hospitalist during your hospital stay. Once you are  discharged, your primary care physician will handle any further medical issues. Please note that NO REFILLS for any discharge medications will be authorized once you are discharged, as it is imperative that you return to your primary care physician (or establish a relationship with a primary care physician if you do not have one) for your post hospital discharge needs so that they can reassess your need for medications and monitor your lab values.  Total Time spent coordinating discharge including counseling, education and face to face time equals 35 minutes.  SignedOren Binet 02/12/2021 9:37 AM

## 2021-02-12 NOTE — Discharge Instructions (Signed)

## 2021-02-13 ENCOUNTER — Ambulatory Visit: Payer: No Typology Code available for payment source | Admitting: Physical Therapy

## 2021-02-13 ENCOUNTER — Other Ambulatory Visit: Payer: Self-pay | Admitting: Registered Nurse

## 2021-02-13 DIAGNOSIS — I82441 Acute embolism and thrombosis of right tibial vein: Secondary | ICD-10-CM

## 2021-02-13 NOTE — Telephone Encounter (Signed)
Pt results are back from the protime-INR he had done at [facility]. There were orders placed for protime-INR yesterday for the pt to get completed at [facility], no results from [place] are back.

## 2021-02-13 NOTE — Telephone Encounter (Signed)
Unfortunately obviously we cannot do this in office but I can place order for [place] location, pt should follow up with me in one week or so for further lab work. In addition I have referred him to hematology and coumadin monitoring   Thank you  Denice Paradise

## 2021-02-13 NOTE — Telephone Encounter (Signed)
Patient has called to update/notify PCP that they have completed given their sample

## 2021-02-14 LAB — PROTIME-INR
INR: 3 — ABNORMAL HIGH (ref 0.9–1.2)
Prothrombin Time: 30.3 s — ABNORMAL HIGH (ref 9.1–12.0)

## 2021-02-16 ENCOUNTER — Ambulatory Visit: Payer: No Typology Code available for payment source | Admitting: Physical Therapy

## 2021-02-20 ENCOUNTER — Ambulatory Visit: Payer: No Typology Code available for payment source | Admitting: Physical Therapy

## 2021-02-20 ENCOUNTER — Other Ambulatory Visit: Payer: Self-pay

## 2021-02-20 ENCOUNTER — Telehealth: Payer: Self-pay | Admitting: *Deleted

## 2021-02-20 DIAGNOSIS — Z7409 Other reduced mobility: Secondary | ICD-10-CM | POA: Diagnosis present

## 2021-02-20 DIAGNOSIS — M6281 Muscle weakness (generalized): Secondary | ICD-10-CM | POA: Diagnosis not present

## 2021-02-20 DIAGNOSIS — R262 Difficulty in walking, not elsewhere classified: Secondary | ICD-10-CM | POA: Diagnosis present

## 2021-02-20 NOTE — Therapy (Addendum)
[place] [place], [place], [address] Phone: [phone]   Fax:  [phone]  Physical Therapy Treatment and Discharge note  Patient Details  Name: Ian Hall MRN: [id number] Date of Birth: 1966-04-05 Referring Provider (PT): [facility] THERAPY DISCHARGE SUMMARY  Visits from Start of Care: 2  Current functional level related to goals / functional outcomes: Unable to assess due to unplanned discharge    Remaining deficits: Unable to assess due to unplanned discharge  Education / Equipment: Unable to assess due to unplanned discharge    Patient agrees to discharge. Patient goals were not met. Patient is being discharged due to not returning since the last visit.   Signing therapist has never treated or seen patient, but is discharging patient on behalf of last treating therapist secondary to long standing open episode that needs closing.  3:07 PM, 12/07/21 Jerene Pitch, DPT Physical Therapy with [facility]  [phone] office   Encounter Date: 02/20/2021   PT End of Session - 02/20/21 0911     Visit Number 2    Date for PT Re-Evaluation 04/04/21    PT Start Time 0835    PT Stop Time 0915    PT Time Calculation (min) 40 min             Past Medical History:  Diagnosis Date   Anxiety    Cirrhosis ([place])    COVID-19     Past Surgical History:  Procedure Laterality Date   ANKLE SURGERY Right    IR PARACENTESIS  01/07/2021   IR PARACENTESIS  01/30/2021   IR TRANSCATHETER BX  01/09/2021   IR US GUIDE VASC ACCESS RIGHT  01/09/2021   IR VENOGRAM HEPATIC W HEMODYNAMIC EVALUATION  01/09/2021    There were no vitals filed for this visit.   Subjective Assessment - 02/20/21 0835     Subjective feeling better than I did. sometimes I have pain but none now    Currently in Pain? No/denies                               [facility] Adult PT  Treatment/Exercise - 02/20/21 0001       Knee/Hip Exercises: Aerobic   Nustep L 4 61mn    Other Aerobic UBE L 4 2 fwd/2 back      Knee/Hip Exercises: Machines for Strengthening   Cybex Knee Extension 10# 2 sets1 0    Cybex Knee Flexion 20# 2 sets 10    Other Machine lat pull and row 25 # 2 sets 10      Knee/Hip Exercises: Standing   Hip Abduction Stengthening;Both;15 reps;Knee straight   green tband   Hip Extension Stengthening;Both;15 reps;Knee straight   green tband   Lateral Step Up Both;10 reps;Hand Hold: 1;Step Height: 6"    Forward Step Up Both;10 reps;Hand Hold: 2;Step Height: 6"    Other Standing Knee Exercises cable pulley shld ext and row 2 sets 10      Knee/Hip Exercises: Seated   Sit to Sand 3 sets;5 reps;without UE support   wt ball press                     PT Short Term Goals - 02/20/21 0910       PT SHORT TERM GOAL #1   Title Pt will be I with initial HEP  Status Achieved               PT Long Term Goals - 02/02/21 1401       PT LONG TERM GOAL #1   Title Pt will demo 3MWT with no standing rest breaks and RPE <5/10    Time 8    Period Weeks    Status New    Target Date 03/30/21      PT LONG TERM GOAL #2   Title Pt will demo 5xSTS <15 sec with no UE assist    Baseline 30 sec with UE assist for several reps    Time 8    Period Weeks    Status New    Target Date 03/30/21      PT LONG TERM GOAL #3   Title Pt will demo TUG <15 sec with <4/10 RPE    Baseline 18 sec 5/10 RPE    Time 8    Period Weeks    Status New    Target Date 03/30/21      PT LONG TERM GOAL #4   Title Pt will report able to complete ADLs with no increase in SOB    Time 8    Period Weeks    Status New    Target Date 03/30/21                   Plan - 02/20/21 0911     Clinical Impression Statement STG met. Pt tolerated initial progression of ther ex well. No pain or issues- cuing needed for tech. O2 97%    PT Treatment/Interventions ADLs/Self  Care Home Management;Gait training;Stair training;Therapeutic activities;Therapeutic exercise;Neuromuscular re-education;Patient/family education;Manual techniques    PT Next Visit Plan assess and progression of strengthening. Endurance ex's and functional strengthening. Monitor O2 as indicated.             Patient will benefit from skilled therapeutic intervention in order to improve the following deficits and impairments:  Abnormal gait,Difficulty walking,Decreased endurance,Decreased activity tolerance,Pain,Decreased strength,Decreased mobility  Visit Diagnosis: Muscle weakness (generalized)  Decreased functional mobility and endurance     Problem List Patient Active Problem List   Diagnosis Date Noted   Acute deep vein thrombosis (DVT) of right tibial vein (HCC) 02/12/2021   Hyponatremia 02/06/2021   Dyspnea on exertion    Abdominal swelling, generalized    Ascites due to alcoholic cirrhosis (HCC)    Jaundice    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 [place], [place], [address] Phone: [phone]   Fax:  [phone]  Name: STARK AGUINAGA MRN: [id number] Date of Birth: 15-Nov-1966

## 2021-02-20 NOTE — Telephone Encounter (Signed)
TOC CM received message to cal pt back in regards to his medications. Pt states he only received one medication at dc. Attempted call to pt to follow and no answer. Pt has an appt with his PCP on 02/27/2021. [place], [place] ED TOC CM [phone]

## 2021-02-23 ENCOUNTER — Ambulatory Visit: Payer: No Typology Code available for payment source | Admitting: Physical Therapy

## 2021-02-24 ENCOUNTER — Inpatient Hospital Stay (HOSPITAL_COMMUNITY): Payer: No Typology Code available for payment source

## 2021-02-24 ENCOUNTER — Inpatient Hospital Stay (HOSPITAL_COMMUNITY)
Admit: 2021-02-24 | Discharge: 2021-02-24 | Disposition: A | Discharge status: Home / Self Care | Payer: No Typology Code available for payment source | Attending: Emergency Medicine | Admitting: Emergency Medicine

## 2021-02-24 ENCOUNTER — Inpatient Hospital Stay (HOSPITAL_COMMUNITY)
Admission: EM | Admit: 2021-02-24 | Discharge: 2021-03-01 | DRG: 432 | Disposition: A | Discharge status: Home / Self Care | Payer: No Typology Code available for payment source | Attending: Internal Medicine | Admitting: Internal Medicine

## 2021-02-24 ENCOUNTER — Emergency Department (HOSPITAL_COMMUNITY): Payer: No Typology Code available for payment source

## 2021-02-24 ENCOUNTER — Other Ambulatory Visit: Payer: Self-pay

## 2021-02-24 ENCOUNTER — Encounter (HOSPITAL_COMMUNITY): Payer: Self-pay

## 2021-02-24 DIAGNOSIS — Z4659 Encounter for fitting and adjustment of other gastrointestinal appliance and device: Secondary | ICD-10-CM

## 2021-02-24 DIAGNOSIS — Z8616 Personal history of COVID-19: Secondary | ICD-10-CM

## 2021-02-24 DIAGNOSIS — Z9114 Patient's other noncompliance with medication regimen: Secondary | ICD-10-CM | POA: Diagnosis not present

## 2021-02-24 DIAGNOSIS — J9600 Acute respiratory failure, unspecified whether with hypoxia or hypercapnia: Secondary | ICD-10-CM

## 2021-02-24 DIAGNOSIS — K7031 Alcoholic cirrhosis of liver with ascites: Secondary | ICD-10-CM | POA: Diagnosis present

## 2021-02-24 DIAGNOSIS — K7581 Nonalcoholic steatohepatitis (NASH): Secondary | ICD-10-CM | POA: Diagnosis present

## 2021-02-24 DIAGNOSIS — K7040 Alcoholic hepatic failure without coma: Principal | ICD-10-CM | POA: Diagnosis present

## 2021-02-24 DIAGNOSIS — R9431 Abnormal electrocardiogram [ECG] [EKG]: Secondary | ICD-10-CM | POA: Diagnosis not present

## 2021-02-24 DIAGNOSIS — E871 Hypo-osmolality and hyponatremia: Secondary | ICD-10-CM | POA: Diagnosis present

## 2021-02-24 DIAGNOSIS — K7682 Hepatic encephalopathy: Secondary | ICD-10-CM

## 2021-02-24 DIAGNOSIS — D689 Coagulation defect, unspecified: Secondary | ICD-10-CM | POA: Diagnosis present

## 2021-02-24 DIAGNOSIS — G9341 Metabolic encephalopathy: Secondary | ICD-10-CM | POA: Diagnosis present

## 2021-02-24 DIAGNOSIS — E872 Acidosis: Secondary | ICD-10-CM | POA: Diagnosis present

## 2021-02-24 DIAGNOSIS — K7290 Hepatic failure, unspecified without coma: Secondary | ICD-10-CM

## 2021-02-24 DIAGNOSIS — Z79899 Other long term (current) drug therapy: Secondary | ICD-10-CM | POA: Diagnosis not present

## 2021-02-24 DIAGNOSIS — S01512A Laceration without foreign body of oral cavity, initial encounter: Secondary | ICD-10-CM | POA: Diagnosis present

## 2021-02-24 DIAGNOSIS — R569 Unspecified convulsions: Secondary | ICD-10-CM | POA: Diagnosis present

## 2021-02-24 DIAGNOSIS — K7030 Alcoholic cirrhosis of liver without ascites: Secondary | ICD-10-CM

## 2021-02-24 DIAGNOSIS — Z86718 Personal history of other venous thrombosis and embolism: Secondary | ICD-10-CM

## 2021-02-24 DIAGNOSIS — F1010 Alcohol abuse, uncomplicated: Secondary | ICD-10-CM | POA: Diagnosis present

## 2021-02-24 DIAGNOSIS — R739 Hyperglycemia, unspecified: Secondary | ICD-10-CM | POA: Diagnosis present

## 2021-02-24 DIAGNOSIS — Z87891 Personal history of nicotine dependence: Secondary | ICD-10-CM

## 2021-02-24 DIAGNOSIS — K922 Gastrointestinal hemorrhage, unspecified: Secondary | ICD-10-CM

## 2021-02-24 DIAGNOSIS — K7460 Unspecified cirrhosis of liver: Secondary | ICD-10-CM

## 2021-02-24 DIAGNOSIS — D696 Thrombocytopenia, unspecified: Secondary | ICD-10-CM | POA: Diagnosis present

## 2021-02-24 DIAGNOSIS — Z20822 Contact with and (suspected) exposure to covid-19: Secondary | ICD-10-CM | POA: Diagnosis present

## 2021-02-24 DIAGNOSIS — K7011 Alcoholic hepatitis with ascites: Secondary | ICD-10-CM | POA: Diagnosis present

## 2021-02-24 DIAGNOSIS — J9601 Acute respiratory failure with hypoxia: Secondary | ICD-10-CM

## 2021-02-24 DIAGNOSIS — Z7901 Long term (current) use of anticoagulants: Secondary | ICD-10-CM | POA: Diagnosis not present

## 2021-02-24 LAB — PROTIME-INR
INR: 7.9 (ref 0.8–1.2)
INR: 1.8 — ABNORMAL HIGH (ref 0.8–1.2)
Prothrombin Time: 64.1 seconds — ABNORMAL HIGH (ref 11.4–15.2)
Prothrombin Time: 20.1 seconds — ABNORMAL HIGH (ref 11.4–15.2)

## 2021-02-24 LAB — COMPREHENSIVE METABOLIC PANEL
ALT: 53 U/L — ABNORMAL HIGH (ref 0–44)
AST: 139 U/L — ABNORMAL HIGH (ref 15–41)
Albumin: 2.9 g/dL — ABNORMAL LOW (ref 3.5–5.0)
Alkaline Phosphatase: 349 U/L — ABNORMAL HIGH (ref 38–126)
Anion gap: 9 (ref 5–15)
BUN: 25 mg/dL — ABNORMAL HIGH (ref 6–20)
CO2: 17 mmol/L — ABNORMAL LOW (ref 22–32)
Calcium: 8.7 mg/dL — ABNORMAL LOW (ref 8.9–10.3)
Chloride: 104 mmol/L (ref 98–111)
Creatinine, Ser: 1.11 mg/dL (ref 0.61–1.24)
GFR, Estimated: >60 mL/min (ref >60)
Glucose, Bld: 152 mg/dL — ABNORMAL HIGH (ref 70–99)
Potassium: 4.9 mmol/L (ref 3.5–5.1)
Sodium: 130 mmol/L — ABNORMAL LOW (ref 135–145)
Total Bilirubin: 9 mg/dL — ABNORMAL HIGH (ref 0.3–1.2)
Total Protein: 6.6 g/dL (ref 6.5–8.1)

## 2021-02-24 LAB — TYPE AND SCREEN
ABO/RH(D): A POS
Antibody Screen: NEGATIVE

## 2021-02-24 LAB — CBC
HCT: 36.5 % — ABNORMAL LOW (ref 39.0–52.0)
HCT: 33.8 % — ABNORMAL LOW (ref 39.0–52.0)
HCT: 35 % — ABNORMAL LOW (ref 39.0–52.0)
Hemoglobin: 12.7 g/dL — ABNORMAL LOW (ref 13.0–17.0)
Hemoglobin: 11.7 g/dL — ABNORMAL LOW (ref 13.0–17.0)
Hemoglobin: 12 g/dL — ABNORMAL LOW (ref 13.0–17.0)
MCH: 33.9 pg (ref 26.0–34.0)
MCH: 34.6 pg — ABNORMAL HIGH (ref 26.0–34.0)
MCH: 34.4 pg — ABNORMAL HIGH (ref 26.0–34.0)
MCHC: 34.8 g/dL (ref 30.0–36.0)
MCHC: 34.6 g/dL (ref 30.0–36.0)
MCHC: 34.3 g/dL (ref 30.0–36.0)
MCV: 97.3 fL (ref 80.0–100.0)
MCV: 100 fL (ref 80.0–100.0)
MCV: 100.3 fL — ABNORMAL HIGH (ref 80.0–100.0)
Platelets: 75 10*3/uL — ABNORMAL LOW (ref 150–400)
Platelets: 63 10*3/uL — ABNORMAL LOW (ref 150–400)
Platelets: 69 10*3/uL — ABNORMAL LOW (ref 150–400)
RBC: 3.75 MIL/uL — ABNORMAL LOW (ref 4.22–5.81)
RBC: 3.38 MIL/uL — ABNORMAL LOW (ref 4.22–5.81)
RBC: 3.49 MIL/uL — ABNORMAL LOW (ref 4.22–5.81)
RDW: 15.1 % (ref 11.5–15.5)
RDW: 15.2 % (ref 11.5–15.5)
RDW: 15.5 % (ref 11.5–15.5)
WBC: 7.2 10*3/uL (ref 4.0–10.5)
WBC: 6.5 10*3/uL (ref 4.0–10.5)
WBC: 9.2 10*3/uL (ref 4.0–10.5)
nRBC: 0 % (ref 0.0–0.2)
nRBC: 0 % (ref 0.0–0.2)
nRBC: 0 % (ref 0.0–0.2)

## 2021-02-24 LAB — BLOOD GAS, VENOUS
Acid-base deficit: 6.5 mmol/L — ABNORMAL HIGH (ref 0.0–2.0)
Bicarbonate: 16.9 mmol/L — ABNORMAL LOW (ref 20.0–28.0)
FIO2: 21
O2 Saturation: 88.7 %
Patient temperature: 98.6
pCO2, Ven: 29.2 mmHg — ABNORMAL LOW (ref 44.0–60.0)
pH, Ven: 7.382 (ref 7.250–7.430)
pO2, Ven: 63.1 mmHg — ABNORMAL HIGH (ref 32.0–45.0)

## 2021-02-24 LAB — LACTIC ACID, PLASMA
Lactic Acid, Venous: 4 mmol/L (ref 0.5–1.9)
Lactic Acid, Venous: 2 mmol/L (ref 0.5–1.9)

## 2021-02-24 LAB — I-STAT CHEM 8, ED
BUN: 24 mg/dL — ABNORMAL HIGH (ref 6–20)
Calcium, Ion: 1.17 mmol/L (ref 1.15–1.40)
Chloride: 103 mmol/L (ref 98–111)
Creatinine, Ser: 0.9 mg/dL (ref 0.61–1.24)
Glucose, Bld: 147 mg/dL — ABNORMAL HIGH (ref 70–99)
HCT: 36 % — ABNORMAL LOW (ref 39.0–52.0)
Hemoglobin: 12.2 g/dL — ABNORMAL LOW (ref 13.0–17.0)
Potassium: 5 mmol/L (ref 3.5–5.1)
Sodium: 132 mmol/L — ABNORMAL LOW (ref 135–145)
TCO2: 19 mmol/L — ABNORMAL LOW (ref 22–32)

## 2021-02-24 LAB — ECHOCARDIOGRAM COMPLETE
Area-P 1/2: 2.99 cm2
Calc EF: 71.9 %
Height: 65 in
S' Lateral: 2.6 cm
Single Plane A2C EF: 75.6 %
Single Plane A4C EF: 72.1 %
Weight: 3294.55 oz

## 2021-02-24 LAB — BLOOD GAS, ARTERIAL
Acid-base deficit: 8.9 mmol/L — ABNORMAL HIGH (ref 0.0–2.0)
Bicarbonate: 16.6 mmol/L — ABNORMAL LOW (ref 20.0–28.0)
FIO2: 60
O2 Saturation: 99.3 %
Patient temperature: 98.6
pCO2 arterial: 36.1 mmHg (ref 32.0–48.0)
pH, Arterial: 7.284 — ABNORMAL LOW (ref 7.350–7.450)
pO2, Arterial: 168 mmHg — ABNORMAL HIGH (ref 83.0–108.0)

## 2021-02-24 LAB — RESP PANEL BY RT-PCR (FLU A&B, COVID) ARPGX2
Influenza A by PCR: NEGATIVE
Influenza B by PCR: NEGATIVE
SARS Coronavirus 2 by RT PCR: NEGATIVE

## 2021-02-24 LAB — APTT: aPTT: 81 seconds — ABNORMAL HIGH (ref 24–36)

## 2021-02-24 LAB — GLUCOSE, CAPILLARY
Glucose-Capillary: 112 mg/dL — ABNORMAL HIGH (ref 70–99)
Glucose-Capillary: 116 mg/dL — ABNORMAL HIGH (ref 70–99)

## 2021-02-24 LAB — TROPONIN I (HIGH SENSITIVITY)
Troponin I (High Sensitivity): 7 ng/L (ref <18)
Troponin I (High Sensitivity): 3 ng/L (ref <18)

## 2021-02-24 LAB — AMMONIA
Ammonia: 234 umol/L — ABNORMAL HIGH (ref 9–35)
Ammonia: 138 umol/L — ABNORMAL HIGH (ref 9–35)

## 2021-02-24 LAB — ETHANOL: Alcohol, Ethyl (B): <10 mg/dL (ref <10)

## 2021-02-24 LAB — ABO/RH: ABO/RH(D): A POS

## 2021-02-24 MED ORDER — FENTANYL 2500MCG IN NS 250ML (10MCG/ML) PREMIX INFUSION
50.0000 ug/h | INTRAVENOUS | Status: DC
  Administered 2021-02-24: 50 ug/h via INTRAVENOUS
  Administered 2021-02-25: 75 ug/h via INTRAVENOUS
  Filled 2021-02-24 (×2): qty 250

## 2021-02-24 MED ORDER — DOCUSATE SODIUM 50 MG/5ML PO LIQD
100.0000 mg | Freq: Two times a day (BID) | ORAL | Status: DC
  Administered 2021-02-24 – 2021-02-27 (×7): 100 mg
  Filled 2021-02-24 (×7): qty 10

## 2021-02-24 MED ORDER — SODIUM CHLORIDE 0.9 % IV SOLN
50.0000 ug/h | INTRAVENOUS | Status: DC
  Administered 2021-02-24 – 2021-02-25 (×3): 50 ug/h via INTRAVENOUS
  Filled 2021-02-24 (×4): qty 1

## 2021-02-24 MED ORDER — DOCUSATE SODIUM 100 MG PO CAPS: 100.0000 mg | ORAL_CAPSULE | Freq: Two times a day (BID) | ORAL | Status: DC | PRN

## 2021-02-24 MED ORDER — PROPOFOL 1000 MG/100ML IV EMUL
0.0000 ug/kg/min | INTRAVENOUS | Status: DC
  Administered 2021-02-24 – 2021-02-25 (×2): 25 ug/kg/min via INTRAVENOUS
  Administered 2021-02-25 – 2021-02-26 (×2): 10 ug/kg/min via INTRAVENOUS
  Filled 2021-02-24 (×5): qty 100

## 2021-02-24 MED ORDER — CHLORHEXIDINE GLUCONATE 0.12% ORAL RINSE (MEDLINE KIT)
15.0000 mL | Freq: Two times a day (BID) | OROMUCOSAL | Status: DC
  Administered 2021-02-24 – 2021-02-26 (×5): 15 mL via OROMUCOSAL

## 2021-02-24 MED ORDER — PANTOPRAZOLE SODIUM 40 MG IV SOLR
40.0000 mg | Freq: Two times a day (BID) | INTRAVENOUS | Status: DC
  Administered 2021-02-24: 40 mg via INTRAVENOUS

## 2021-02-24 MED ORDER — POLYETHYLENE GLYCOL 3350 17 G PO PACK
17.0000 g | PACK | Freq: Every day | ORAL | Status: DC
  Administered 2021-02-24 – 2021-02-27 (×4): 17 g
  Filled 2021-02-24 (×4): qty 1

## 2021-02-24 MED ORDER — SUCCINYLCHOLINE CHLORIDE 200 MG/10ML IV SOSY
PREFILLED_SYRINGE | INTRAVENOUS | Status: AC
  Filled 2021-02-24: qty 10

## 2021-02-24 MED ORDER — SODIUM CHLORIDE 0.9 % IV BOLUS
500.0000 mL | Freq: Once | INTRAVENOUS | Status: AC
  Administered 2021-02-24: 500 mL via INTRAVENOUS

## 2021-02-24 MED ORDER — POLYETHYLENE GLYCOL 3350 17 G PO PACK: 17.0000 g | PACK | Freq: Every day | ORAL | Status: DC | PRN

## 2021-02-24 MED ORDER — SODIUM CHLORIDE 0.9 % IV BOLUS
1000.0000 mL | Freq: Once | INTRAVENOUS | Status: AC
  Administered 2021-02-24: 1000 mL via INTRAVENOUS

## 2021-02-24 MED ORDER — PROPOFOL 1000 MG/100ML IV EMUL
INTRAVENOUS | Status: AC
  Administered 2021-02-24: 5 ug/kg/min via INTRAVENOUS
  Filled 2021-02-24: qty 100

## 2021-02-24 MED ORDER — VITAMIN K1 10 MG/ML IJ SOLN
10.0000 mg | INTRAVENOUS | Status: AC
  Administered 2021-02-24: 10 mg via INTRAVENOUS
  Filled 2021-02-24: qty 1

## 2021-02-24 MED ORDER — PROTHROMBIN COMPLEX CONC HUMAN 500 UNITS IV KIT
2120.0000 [IU] | PACK | Status: AC
  Administered 2021-02-24: 2120 [IU] via INTRAVENOUS
  Filled 2021-02-24: qty 2120

## 2021-02-24 MED ORDER — FENTANYL CITRATE (PF) 100 MCG/2ML IJ SOLN
50.0000 ug | Freq: Once | INTRAMUSCULAR | Status: AC
  Administered 2021-02-24: 50 ug via INTRAVENOUS
  Filled 2021-02-24: qty 2

## 2021-02-24 MED ORDER — SODIUM CHLORIDE 0.9 % IV SOLN
1.0000 g | INTRAVENOUS | Status: DC
  Administered 2021-02-24: 1 g via INTRAVENOUS
  Filled 2021-02-24 (×3): qty 10

## 2021-02-24 MED ORDER — OCTREOTIDE LOAD VIA INFUSION
50.0000 ug | Freq: Once | INTRAVENOUS | Status: AC
  Administered 2021-02-24: 50 ug via INTRAVENOUS
  Filled 2021-02-24: qty 25

## 2021-02-24 MED ORDER — ETOMIDATE 2 MG/ML IV SOLN
20.0000 mg | Freq: Once | INTRAVENOUS | Status: AC
  Administered 2021-02-24: 20 mg via INTRAVENOUS

## 2021-02-24 MED ORDER — SODIUM CHLORIDE 0.9 % IV SOLN
80.0000 mg | Freq: Once | INTRAVENOUS | Status: AC
  Administered 2021-02-24: 80 mg via INTRAVENOUS
  Filled 2021-02-24: qty 80

## 2021-02-24 MED ORDER — ORAL CARE MOUTH RINSE
15.0000 mL | OROMUCOSAL | Status: DC
  Administered 2021-02-24 – 2021-02-26 (×23): 15 mL via OROMUCOSAL

## 2021-02-24 MED ORDER — SODIUM CHLORIDE 0.9 % IV SOLN
8.0000 mg/h | INTRAVENOUS | Status: DC
  Administered 2021-02-24: 8 mg/h via INTRAVENOUS
  Filled 2021-02-24 (×3): qty 80

## 2021-02-24 MED ORDER — MIDAZOLAM HCL 2 MG/2ML IJ SOLN
INTRAMUSCULAR | Status: AC
  Administered 2021-02-24: 4 mg
  Filled 2021-02-24: qty 2

## 2021-02-24 MED ORDER — LEVETIRACETAM IN NACL 500 MG/100ML IV SOLN
500.0000 mg | Freq: Two times a day (BID) | INTRAVENOUS | Status: DC
  Administered 2021-02-24 – 2021-02-28 (×9): 500 mg via INTRAVENOUS
  Filled 2021-02-24 (×9): qty 100

## 2021-02-24 MED ORDER — FENTANYL BOLUS VIA INFUSION
50.0000 ug | INTRAVENOUS | Status: DC | PRN
Start: 2021-02-24 — End: 2021-02-26
  Administered 2021-02-24 – 2021-02-25 (×2): 50 ug via INTRAVENOUS
  Administered 2021-02-25 – 2021-02-26 (×3): 100 ug via INTRAVENOUS
  Filled 2021-02-24: qty 100

## 2021-02-24 MED ORDER — PANTOPRAZOLE SODIUM 40 MG IV SOLR
40.0000 mg | Freq: Two times a day (BID) | INTRAVENOUS | Status: DC
  Administered 2021-02-24 – 2021-02-28 (×9): 40 mg via INTRAVENOUS
  Filled 2021-02-24 (×9): qty 40

## 2021-02-24 MED ORDER — LACTULOSE 10 GM/15ML PO SOLN
20.0000 g | Freq: Three times a day (TID) | ORAL | Status: DC
  Administered 2021-02-24 – 2021-02-25 (×4): 20 g
  Filled 2021-02-24 (×4): qty 30

## 2021-02-24 MED ORDER — ETOMIDATE 2 MG/ML IV SOLN
INTRAVENOUS | Status: AC
  Administered 2021-02-24: 20 mg
  Filled 2021-02-24: qty 20

## 2021-02-24 MED ORDER — CHLORHEXIDINE GLUCONATE CLOTH 2 % EX PADS
6.0000 | MEDICATED_PAD | Freq: Every day | CUTANEOUS | Status: DC
  Administered 2021-02-24 – 2021-02-28 (×5): 6 via TOPICAL

## 2021-02-24 NOTE — ED Provider Notes (Signed)
[facility] Emergency Department Provider Note MRN:  [id number]  Arrival date & time: 02/24/21     Chief Complaint   Seizures   History of Present Illness   Ian Hall is a 55 y.o. year-old male with a history of cirrhosis presenting to the ED with chief complaint of seizure.  Reported seizure activity at home, EMS arrived and found patient largely unresponsive.  Hematemesis in route.  Patient is minimally responsive.  Review of Systems  Positive for unresponsiveness, hematemesis.  Patient's Health History    Past Medical History:  Diagnosis Date  . Anxiety   . Cirrhosis ([place])   . COVID-19     Past Surgical History:  Procedure Laterality Date  . ANKLE SURGERY Right   . IR PARACENTESIS  01/07/2021  . IR PARACENTESIS  01/30/2021  . IR TRANSCATHETER BX  01/09/2021  . IR US GUIDE VASC ACCESS RIGHT  01/09/2021  . IR VENOGRAM HEPATIC W HEMODYNAMIC EVALUATION  01/09/2021    Family History  Problem Relation Age of Onset  . Diabetes Mother   . Memory loss Mother   . Diabetes Father   . Diabetes Cousin     Social History   Socioeconomic History  . Marital status: Married    Spouse name: Not on file  . Number of children: 0  . Years of education: Not on file  . Highest education level: Not on file  Occupational History  . Occupation: IT sales professional: [employer]  Tobacco Use  . Smoking status: Former Smoker    Types: Cigarettes    Quit date: 12/16/2002    Years since quitting: 18.2  . Smokeless tobacco: Never Used  Vaping Use  . Vaping Use: Never used  Substance and Sexual Activity  . Alcohol use: Not Currently    Comment: none for 3-4 years (stated 12/16/2020)  . Drug use: No  . Sexual activity: Yes    Birth control/protection: None  Other Topics Concern  . Not on file  Social History Narrative  . Not on file   Social Determinants of Health   Financial Resource Strain: Not on file  Food Insecurity: Not on file   Transportation Needs: Not on file  Physical Activity: Not on file  Stress: Not on file  Social Connections: Not on file  Intimate Partner Violence: Not on file     Physical Exam   Vitals:   02/24/21 0600 02/24/21 0615  BP: 123/79 124/89  Pulse: 89   Resp: 18 (!) 24  Temp:    SpO2: 98% 100%    CONSTITUTIONAL: Ill-appearing NEURO: Somnolent, largely unresponsive, upper extremities with extensor posturing in response to painful stimuli, sonorous/garbled speech in response to pain EYES:  eyes equal and reactive, upward gaze deviation ENT/NECK:  no LAD, no JVD CARDIO: Regular rate, well-perfused, normal S1 and S2 PULM:  CTAB no wheezing or rhonchi GI/GU:  normal bowel sounds, non-distended, non-tender MSK/SPINE:  No gross deformities, no edema SKIN:  no rash, atraumatic PSYCH:  Appropriate speech and behavior  *Additional and/or pertinent findings included in MDM below  Diagnostic and Interventional Summary    EKG Interpretation  Date/Time:  Tuesday February 24 2021 05:36:06 EDT Ventricular Rate:  85 PR Interval:  145 QRS Duration: 90 QT Interval:  397 QTC Calculation: 473 R Axis:   46 Text Interpretation: Sinus rhythm No significant change was found Confirmed by Gerlene Fee [phone]) on 02/24/2021 6:41:45 AM      Labs Reviewed  CBC - Abnormal; Notable for the following components:      Result Value   RBC 3.75 (*)    Hemoglobin 12.7 (*)    HCT 36.5 (*)    Platelets 75 (*)    All other components within normal limits  COMPREHENSIVE METABOLIC PANEL - Abnormal; Notable for the following components:   Sodium 130 (*)    CO2 17 (*)    Glucose, Bld 152 (*)    BUN 25 (*)    Calcium 8.7 (*)    Albumin 2.9 (*)    AST 139 (*)    ALT 53 (*)    Alkaline Phosphatase 349 (*)    Total Bilirubin 9.0 (*)    All other components within normal limits  LACTIC ACID, PLASMA - Abnormal; Notable for the following components:   Lactic Acid, Venous 4.0 (*)    All other components  within normal limits  BLOOD GAS, VENOUS - Abnormal; Notable for the following components:   pCO2, Ven 29.2 (*)    pO2, Ven 63.1 (*)    Bicarbonate 16.9 (*)    Acid-base deficit 6.5 (*)    All other components within normal limits  AMMONIA - Abnormal; Notable for the following components:   Ammonia 234 (*)    All other components within normal limits  I-STAT CHEM 8, ED - Abnormal; Notable for the following components:   Sodium 132 (*)    BUN 24 (*)    Glucose, Bld 147 (*)    TCO2 19 (*)    Hemoglobin 12.2 (*)    HCT 36.0 (*)    All other components within normal limits  CULTURE, BLOOD (SINGLE)  ETHANOL  URINALYSIS, ROUTINE W REFLEX MICROSCOPIC  PROTIME-INR  APTT  TYPE AND SCREEN  ABO/RH  TROPONIN I (HIGH SENSITIVITY)    CT HEAD WO CONTRAST  Final Result    DG Chest Port 1 View  Final Result      Medications  pantoprazole (PROTONIX) 80 mg in sodium chloride 0.9 % 100 mL IVPB (has no administration in time range)  pantoprazole (PROTONIX) 80 mg in sodium chloride 0.9 % 100 mL (0.8 mg/mL) infusion (8 mg/hr Intravenous New Bag/Given 02/24/21 0639)  pantoprazole (PROTONIX) injection 40 mg (40 mg Intravenous Given 02/24/21 0619)  sodium chloride 0.9 % bolus 500 mL (0 mLs Intravenous Stopped 02/24/21 0641)  sodium chloride 0.9 % bolus 1,000 mL (1,000 mLs Intravenous New Bag/Given 02/24/21 0644)     Procedures  /  Critical Care .Critical Care Performed by: Maudie Flakes, MD Authorized by: Maudie Flakes, MD   Critical care provider statement:    Critical care time (minutes):  35   Critical care was necessary to treat or prevent imminent or life-threatening deterioration of the following conditions:  CNS failure or compromise and hepatic failure   Critical care was time spent personally by me on the following activities:  Discussions with consultants, evaluation of patient's response to treatment, examination of patient, ordering and performing treatments and interventions,  ordering and review of laboratory studies, ordering and review of radiographic studies, pulse oximetry, re-evaluation of patient's condition, obtaining history from patient or surrogate and review of old charts    ED Course and Medical Decision Making  I have reviewed the triage vital signs, the nursing notes, and pertinent available records from the EMR.  Listed above are laboratory and imaging tests that I personally ordered, reviewed, and interpreted and then considered in my medical decision making (see below for  details).  Recent admission for hyponatremia, recently started on Coumadin for incidentally found DVT.  Here with report of seizure, unwitnessed by healthcare providers.  Patient is significantly altered, does not appear to be actively seizing, initial concerns for hepatic encephalopathy versus intracranial bleeding versus profound hyponatremia.  I-STAT is reassuring, CT head is without acute process.  Question HE versus nonconvulsive status.  Case discussed with Dr. Leonel Ramsay of neurology, likely a severe case of HE.  Plan is to admit here at [facility] and obtain EEG later today.  Patient also having hematemesis, recent upper endoscopy without evidence of varices back in February.  Providing Protonix drip.  H&H reassuring, hemodynamically stable, will admit to ICU. Clinical Course as of 02/24/21 2575  Tue Feb 24, 2021  0620 [facility] Chest [facility] 1 View [SP]    Clinical Course User Index [SP] Michail Sermon. [place] Small, [place] [email or web address]  Final Clinical Impressions(s) / ED Diagnoses     ICD-10-CM   1. Hepatic encephalopathy ([place])  K72.90     ED Discharge Orders    None       Discharge Instructions Discussed with and Provided to Patient:   Discharge Instructions   None       Maudie Flakes, MD 02/24/21 [phone]

## 2021-02-24 NOTE — Consult Note (Addendum)
Referring Provider:  PCCM Primary Care Physician:  Ian Coss, NP Primary Gastroenterologist:  Dr. Tarri Glenn  Reason for Consultation:  Hematemesis; elevated ammonia, cirrhosis  HPI: Ian Hall is a 55 y.o. male with PMH of NASH cirrhosis and then recent LE DVT at the time that he tested positive for COVID earlier this month.  Was started on coumadin at that time.  Recently established with Dr. Tarri Glenn and with diagnosis of decompensated cirrhosis, biopsy proven probable NASH plus minus ASH and biopsy also suggested possibility of DILI though patient denies use of any medications prior to diagnosis.  As of 01/14/2019 MELD 25.  He had been on a trial of steroids which were discontinued due to lack of response. He's had paracentesis, last 3/12 with 2.6 Liters removed.  No history of SBP.  Is on lasix 40 mg daily and aldactone 100 mg daily at home and overall has diuresed about 30 pounds.  EGD 12/2020 by Dr. Tarri Glenn showed  - Discolored mucosa in the esophagus. Biopsied. - A medium amount of food (residue) in the stomach. - Erythematous mucosa in the gastric body. Biopsied. - Normal stomach. - The examination was otherwise normal.   1. Surgical [P], random gastric bx - REACTIVE GASTROPATHY WITH EROSIONS. [name] IS NEGATIVE FOR HELICOBACTER PYLORI. - NO INTESTINAL METAPLASIA, DYSPLASIA, OR MALIGNANCY. 2. Surgical [P], random esophageal bx - REFLUX CHANGES. - NO INTESTINAL METAPLASIA, DYSPLASIA, OR MALIGNANCY.  Was admitted due to progressive confusion and lethargy over the past several days and seizure-like activity.  Noted to have blood on his mouth and apparently had an episode of hematemesis in route to the hospital.  Hgb 12.7 grams, INR 7.9 in the setting of coumadin use.  Was reversed and down to 1.8.  Ammonia level was 234.  Past Medical History:  Diagnosis Date  . Anxiety   . Cirrhosis ([place])   . COVID-19     Past Surgical History:  Procedure Laterality Date   . ANKLE SURGERY Right   . IR PARACENTESIS  01/07/2021  . IR PARACENTESIS  01/30/2021  . IR TRANSCATHETER BX  01/09/2021  . IR US GUIDE VASC ACCESS RIGHT  01/09/2021  . IR VENOGRAM HEPATIC W HEMODYNAMIC EVALUATION  01/09/2021    Prior to Admission medications   Medication Sig Start Date End Date Taking? Authorizing Provider  dicyclomine (BENTYL) 10 MG capsule Take 1 capsule (10 mg total) by mouth 4 (four) times daily -  before meals and at bedtime. 12/16/20  Yes Thornton Park, MD  folic acid (FOLVITE) 1 MG tablet Take 1 tablet (1 mg total) by mouth daily. 12/29/20  Yes Shelly Coss, MD  furosemide (LASIX) 40 MG tablet Take 1 tablet (40 mg total) by mouth daily. 01/29/21  Yes Esterwood, Amy S, PA-C  spironolactone (ALDACTONE) 100 MG tablet Take 1 tablet (100 mg total) by mouth daily. 01/29/21  Yes Esterwood, Amy S, PA-C  warfarin (COUMADIN) 2 MG tablet Take 1 tablet (2 mg total) by mouth daily. Start after you talk with your PCP/after your INR has been checked 02/13/21 03/15/21 Yes Ghimire, Henreitta Leber, MD  hydrOXYzine (ATARAX/VISTARIL) 10 MG tablet Take 0.5-1 tablets (5-10 mg total) by mouth at bedtime as needed for itching. 02/04/21   Ian Coss, NP  lactulose (CEPHULAC) 10 g packet Take 3 packets (30 g total) by mouth 3 (three) times daily. 01/29/21   Esterwood, Amy S, PA-C  rifaximin (XIFAXAN) 550 MG TABS tablet Take 1 tablet (550 mg total) by mouth 2 (two) times  daily. 01/29/21   Esterwood, Amy S, PA-C  thiamine 100 MG tablet Take 1 tablet (100 mg total) by mouth daily. Patient not taking: No sig reported 12/29/20   Shelly Coss, MD    Current Facility-Administered Medications  Medication Dose Route Frequency Provider Last Rate Last Admin  . cefTRIAXone (ROCEPHIN) 1 g in sodium chloride 0.9 % 100 mL IVPB  1 g Intravenous Q24H Byrum, Rose Fillers, MD      . Chlorhexidine Gluconate Cloth 2 % PADS 6 each  6 each Topical Daily Byrum, Rose Fillers, MD      . docusate (COLACE) 50 MG/5ML liquid 100 mg  100 mg  Per Tube BID Merlene Laughter F, NP      . docusate sodium (COLACE) capsule 100 mg  100 mg Oral BID PRN Merlene Laughter F, NP      . fentaNYL (SUBLIMAZE) bolus via infusion 50-100 mcg  50-100 mcg Intravenous Q15 min PRN Merlene Laughter F, NP   50 mcg at 02/24/21 0944  . fentaNYL 254mg in NS 2551m(1027mml) infusion-PREMIX  50-200 mcg/hr Intravenous Continuous DavMerlene Laughter NP 5 mL/hr at 02/24/21 0924 50 mcg/hr at 02/24/21 0924  . lactulose (CHRONULAC) 10 GM/15ML solution 20 g  20 g Per Tube TID ByrCollene GobbleD      . levETIRAcetam (KEPPRA) IVPB 500 mg/100 mL premix  500 mg Intravenous Q12H ByrCollene GobbleD 400 mL/hr at 02/24/21 1021 500 mg at 02/24/21 1021  . pantoprazole (PROTONIX) 80 mg in sodium chloride 0.9 % 100 mL (0.8 mg/mL) infusion  8 mg/hr Intravenous Continuous BerMaudie FlakesD 10 mL/hr at 02/24/21 0639 8 mg/hr at 02/24/21 0639  . [START ON 02/27/2021] pantoprazole (PROTONIX) injection 40 mg  40 mg Intravenous Q12H BerMaudie FlakesD   40 mg at 02/24/21 [id number] polyethylene glycol (MIRALAX / GLYCOLAX) packet 17 g  17 g Oral Daily PRN DavMerlene Laughter NP      . polyethylene glycol (MIRALAX / GLYCOLAX) packet 17 g  17 g Per Tube Daily DavMerlene Laughter NP      . propofol (DIPRIVAN) 1000 MG/100ML infusion  0-50 mcg/kg/min Intravenous Continuous DavMerlene Laughter NP 16.81 mL/hr at 02/24/21 0945 30 mcg/kg/min at 02/24/21 0945    Allergies as of 02/24/2021 - Review Complete 02/24/2021  Allergen Reaction Noted  . Hydrocodone Itching and Other (See Comments) 08/26/2015    Family History  Problem Relation Age of Onset  . Diabetes Mother   . Memory loss Mother   . Diabetes Father   . Diabetes Cousin     Social History   Socioeconomic History  . Marital status: Married    Spouse name: Not on file  . Number of children: 0  . Years of education: Not on file  . Highest education level: Not on file  Occupational History  . Occupation: [employer]  Tobacco Use  . Smoking status: Former Smoker    Types: Cigarettes    Quit date: 12/16/2002    Years since quitting: 18.2  . Smokeless tobacco: Never Used  Vaping Use  . Vaping Use: Never used  Substance and Sexual Activity  . Alcohol use: Not Currently    Comment: none for 3-4 years (stated 12/16/2020)  . Drug use: No  . Sexual activity: Yes    Birth control/protection: None  Other Topics Concern  . Not on file  Social History Narrative  . Not on file  Social Determinants of Health   Financial Resource Strain: Not on file  Food Insecurity: Not on file  Transportation Needs: Not on file  Physical Activity: Not on file  Stress: Not on file  Social Connections: Not on file  Intimate Partner Violence: Not on file    Review of Systems: ROS unable to be obtained due to being intubated.  Physical Exam: Vital signs in last 24 hours: Temp:  [98.5 F (36.9 C)] 98.5 F (36.9 C) (03/29 0536) Pulse Rate:  [73-110] 104 (03/29 0900) Resp:  [11-25] 22 (03/29 0900) BP: (107-141)/(62-89) 109/65 (03/29 0900) SpO2:  [98 %-100 %] 100 % (03/29 0900) FiO2 (%):  [100 %] 100 % (03/29 0840) Weight:  [93.4 kg] 93.4 kg (03/29 0537)   General:  Intubated/sedated. Head:  Normocephalic and atraumatic. Eyes:  Scleral icterus noted. Mouth:  Dried blood noted on lips.  Lungs:  Clear throughout to auscultation.  No wheezes, crackles, or rhonchi.  Heart:  Slightly tachy. Abdomen:  Soft, non-distended.  BS present.   Msk:  Symmetrical without gross deformities. Pulses:  Normal pulses noted. Extremities:  Without clubbing or edema. Neurologic:  Alert and oriented x 4;  grossly normal neurologically. Skin:  Intact without significant lesions or rashes. Psych:  Alert and cooperative. Normal mood and affect.  Intake/Output from previous day: 03/28 0701 - 03/29 0700 In: 600 [IV Piggyback:600] Out: -  Intake/Output this shift: Total I/O In: 1117.1 [IV Piggyback:1117.1] Out: -    Lab Results: Recent Labs    02/24/21 0548 02/24/21 0607  WBC 7.2  --   HGB 12.7* 12.2*  HCT 36.5* 36.0*  PLT 75*  --    BMET Recent Labs    02/24/21 0548 02/24/21 0607  NA 130* 132*  K 4.9 5.0  CL 104 103  CO2 17*  --   GLUCOSE 152* 147*  BUN 25* 24*  CREATININE 1.11 0.90  CALCIUM 8.7*  --    LFT Recent Labs    02/24/21 0548  PROT 6.6  ALBUMIN 2.9*  AST 139*  ALT 53*  ALKPHOS 349*  BILITOT 9.0*   PT/INR Recent Labs    02/24/21 0548  LABPROT 64.1*  INR 7.9*    Studies/Results: CT HEAD WO CONTRAST  Result Date: 02/24/2021 CLINICAL DATA:  Seizure EXAM: CT HEAD WITHOUT CONTRAST TECHNIQUE: Contiguous axial images were obtained from the base of the skull through the vertex without intravenous contrast. COMPARISON:  None. FINDINGS: Brain: Normal anatomic configuration. No abnormal intra or extra-axial mass lesion or fluid collection. No abnormal mass effect or midline shift. No evidence of acute intracranial hemorrhage or infarct. Ventricular size is normal. Cerebellum unremarkable. Vascular: Unremarkable Skull: Intact Sinuses/Orbits: Paranasal sinuses are clear. Orbits are unremarkable. Other: Mastoid air cells and middle ear cavities are clear. IMPRESSION: No acute intracranial abnormality. Electronically Signed   By: Fidela Salisbury MD   On: 02/24/2021 06:24   DG Chest Port 1 View  Result Date: 02/24/2021 CLINICAL DATA:  Hypoxia EXAM: PORTABLE CHEST 1 VIEW COMPARISON:  February 24, 2021 study obtained earlier in the day FINDINGS: Endotracheal tube tip is 3.2 cm above the carina. Nasogastric tube coils in the upper esophagus with tube extending back into the patient's pharynx. The tip of the nasogastric tube is to the right of the endotracheal tube at the C5 level. No appreciable central catheter. No edema or airspace opacity. Heart size and pulmonary vascular normal. No adenopathy. No bone lesions. IMPRESSION: Tube positions as described. Note that the nasogastric tube  tip is in the cervical region at the level of C5 after coiling in the upper thoracic esophagus and extending back into the pharynx region. No central catheter seen. Lungs clear.  Cardiac silhouette normal. These results were called by telephone at the time of interpretation on 02/24/2021 at 9:00 am to provider Domenic Moras, who verbally acknowledged these results. Electronically Signed   By: Lowella Grip III M.D.   On: 02/24/2021 09:01   DG Chest Port 1 View  Result Date: 02/24/2021 CLINICAL DATA:  Altered mental status, hemoptysis EXAM: PORTABLE CHEST 1 VIEW COMPARISON:  02/06/2021 FINDINGS: Lung volumes are small, but are symmetric. Patchy infiltrate is developed within the left suprahilar region, possibly infectious in the acute setting. Mild left basilar atelectasis or infiltrate. No pneumothorax or pleural effusion. Cardiac size within normal limits. IMPRESSION: Pulmonary hypoinflation. Superimposed patchy left suprahilar pulmonary infiltrate may be infectious in etiology. Electronically Signed   By: Fidela Salisbury MD   On: 02/24/2021 06:05    IMPRESSION:  #34  55 year old Hispanic male with decompensated NASH/EtOH cirrhosis, and alcoholic hepatitis.  He did receive a trial of steroids at initial diagnosis but this was discontinued due to lack of response. He continues abstinence from alcohol since December. Most recent MELD was 25.  Was supposed to have an appt with [facility] this month.  #2  Altered mental status with confusion and seizure-like activity: Ammonia level was elevated 234.  Patient ultimately intubated due to inability to protect airway.  Had recently been started on Xifaxan 550 mg twice daily and lactulose at home.  Given one dose of lactulose here so far.  Repeat ammonia level was 138.  EEG ordered and pending.  #3  Hematemesis: No esophageal varices seen on recent EGD in February although there was some noted on previous cross-sectional imaging.  BUN is up slightly.   Hemoglobin currently stable at 12.7 g.  Reports of hematemesis in the ED, but only scant blood noted with insertion of NGT per his nurse and so far no output from below.  ? If he could have had a tongue laceration with seizure activity.  #4  Ascites: Weight has been stable.  Last paracentesis was 2.6 L on March 12.  Negative for SBP.  He is on Lasix 40 mg daily and Aldactone 100 mg daily at home.  He has had a diuresis of about 30 pounds overall.  #5 Supratherapeutic INR at 7.9 in the setting of Coumadin use due to recent DVT in March.  Now reversed down to 1.8.  #6 Was Covid+ 2 weeks ago.  Swab this admission is negative.  #7  Hyponatremia:  Na+ 132 this AM   PLAN: -Has been started on Rocephin and PPI drip. -We will begin octreotide drip as well. -Lactulose has been initiated 20 g 3 times daily per tube. -Supportive care for now.  Monitor labs.   Laban Emperor. Zehr  02/24/2021, 10:21 AM    Attending physician's note   I have taken an interval history from wife, reviewed the chart and examined the patient. I agree with the Advanced Practitioner's note, impression and recommendations.   Change in mental status-hepatic encephalopathy vs SZ. Pt intubated. Neg CT head for bleed. EEG neg  Decompensated NASH/ETOH liver cirrhosis with jaundice. No EV on recent EGD 12/2020.  Has thrombocytopenia. MELD not calc since pt on coumadin with supratherapeutic INR 7.6 (d/t recent DVT)  Oropharyngeal bleeding d/t tongue laceration. Doubt UGI bleeding. Hb stable  H/O ascites. No ascites  seen on [place] this morning.  Covid +2 weeks ago.  Currently neg  Plan: -Lactulose 30cc PO QID through the NG tube. If need be, will also start lactulose enemas. -Panculture. No tappable ascites on [place] this AM. -For now, IV Protonix/Rocephin/octreotide until clinical status is clear. -Reverse INR -Resume rifaximin 550 mg NG BID -Resume Lasix/Aldactone 40/100. Can give Lasix IV. -Trend CBC, CMP, INR, and ammonia. -D/W  patient's wife thru interpreter.   Carmell Austria, MD [facility] [phone]

## 2021-02-24 NOTE — H&P (Signed)
NAME:  Ian Hall, MRN:  [id number], DOB:  1966-07-02, LOS: 0 ADMISSION DATE:  02/24/2021, CONSULTATION DATE: 02/24/2021 REFERRING MD:    Dr. Sedonia Small, CHIEF COMPLAINT: Seizures with hematemesis  History of Present Illness:  55 year old male with a past medical history significant for decompensated cirrhosis, alcohol abuse, Covid, lower extremity DVT anticoagulated with Coumadin and anxiety to department via EMS due to altered mental status and possible seizure-like activity.  States she saw patient early morning of admission with decreased mentation and possible seizure activity.  Patient's wife also states patient was bleeding from his mouth.  Per EMS patient remained largely unresponsive and had episode of hematemesis in route to ED.  On assessment in emergency department patient was seen obtunded with snoring respirations and rhythmic right lower extremity sole tightening starting for seizure activity.  Minimal mentation with known GI bleed and severe coagulopathy decision was made and emergently intubate for airway protection.  Pertinent blood work on arrival included NA 130, CO2 17, glucose 152, BUN 25, creatinine 1.12, alkaline phosphatase 349, albumin 2.9, AST 139, ALT 54, ammonia 234, lactic acid 4, hemoglobin 12.7, PT 64.1, INR 7.9.  Of note patient was recently treated at this facility mid March for hyponatremia, incidental finding of COVID-19, and lower extremity DVT at which time he was placed on Coumadin.  Pertinent  Medical History  Decompensated cirrhosis Previous alcohol use Covid Lower extremity DVT anticoagulated with Coumadin Anxiety  Significant Hospital Events: Including procedures, antibiotic start and stop dates in addition to other pertinent events   . 3/29 admitted with altered mental status and questionable seizures with hematemesis, intubated for airway protection in ED  Interim History / Subjective:  Now sedated on ventilator  Objective   Blood pressure 108/64,  pulse 99, temperature 98.5 F (36.9 C), temperature source Oral, resp. rate 17, height 5' 5"  (1.651 m), weight 93.4 kg, SpO2 100 %.    Vent Mode: PRVC FiO2 (%):  [100 %] 100 % Set Rate:  [16 bmp] 16 bmp Vt Set:  [490 mL] 490 mL PEEP:  [5 cmH20] 5 cmH20 Plateau Pressure:  [18 cmH20] 18 cmH20   Intake/Output Summary (Last 24 hours) at 02/24/2021 8119 Last data filed at 02/24/2021 1478 Gross per 24 hour  Intake 1650 ml  Output --  Net 1650 ml   Filed Weights   02/24/21 0537  Weight: 93.4 kg    Examination: General: Chronically ill appearing middle-aged male, lying in bed on mechanical ventilation in no acute distress HEENT: ETT, MM pink/moist, PERRL, clear icteric Neuro: Unresponsive/obtunded prior to intubation, snoring respirations, unable to follow commands CV: s1s2 regular rate and rhythm, no murmur, rubs, or gallops,  PULM: Diminished air entry with snoring respirations prior to intubation, tube now present GI: soft, bowel sounds hypo-active in all 4 quadrants, non-tender, very distended , NG tube to low intermittent wall Extremities: warm/dry, no edema  Skin: no rashes or lesions  Labs/imaging that I have personally reviewed    NA 130, CO2 17, glucose 152, BUN 25, creatinine 1.12, alkaline phosphatase 349, albumin 2.9, AST 139, ALT 54, ammonia 234, lactic acid 4, hemoglobin 12.7, PT 64.1, INR 7.9  3/29 head CT > negative  Resolved Hospital Problem list     Assessment & Plan:  Acute Respiratory insufficiency  -Secondary to metabolic encephalopathy and possible seizure-like activity  P: Continue ventilator support with lung protective strategies  Wean PEEP and FiO2 for sats greater than 90%. Head of bed elevated 30 degrees. Plateau pressures less than  30 cm H20.  Follow intermittent chest x-ray and ABG.   Ensure adequate pulmonary hygiene  Follow cultures  VAP bundle in place  PAD protocol Empiric ceftriaxone  Decompensated NASH/EtOH cirrhosis Alcoholic  hepatitis -MELD score 25, blood failed trial of steroids at time of diagnosis -Medication includes Lasix and Aldactone -Stopped drinking alcohol December 2021 -Recently underwent upper and lower endoscopies with GI 12/30/2020 gross abnormalities or varices identified P: Followed by lower GI, will consult GI during admission Twice daily PPI NG/OG to low intermittent wall suction If cleared per GI to utilize GI tract will initiate oral lactulose if patient will need lactulose enema Trend LFTs Avoid hepatotoxins  Hepatic encephalopathy  -Ammonia on Admission 234 Seizures -Wife reports seizure activity prior to admission patient was also seen with rhythmic right lower extremity leg tension P: Likely secondary to elevated ammonia Start Keppra  Lactulose PR versus PO Supportive care Delirium precautions Minimize sedation as able EEG Head CT negative  Upper GI bleed Coagulopathy Elevated INR -Patient presents with bright red blood oral cavity and episode of hematemesis with EMS -INR 7.9 on admission on home Coumadin P: On Coumadin Status post vitamin K and Kcentra Trend coagulopathy SCDs for DVT prophylaxis  Hyponatremia Hypomagnesemia P: Trend to be met Supplement as needed  Lactic acidosis -Likely reactive given acute illness P: IV hydration, trend lactic acid Supportive care  Diagnosis lower extremity DVT anticoagulated with Coumadin at baseline P: Hold home anticoagulation in the setting of GI bleed  Best practice   Diet:  NPO Pain/Anxiety/Delirium protocol (if indicated): Yes (RASS goal -1) VAP protocol (if indicated): Yes DVT prophylaxis: Contraindicated GI prophylaxis: PPI Glucose control:  SSI Yes Central venous access:  N/A Arterial line:  N/A Foley:  N/A Mobility:  bed rest  PT consulted: N/A Last date of multidisciplinary goals of care discussion Wife updated at bedside 3/29 Code Status:  full code Disposition: ICU  Labs   CBC: Recent Labs   Lab 02/24/21 0548 02/24/21 0607  WBC 7.2  --   HGB 12.7* 12.2*  HCT 36.5* 36.0*  MCV 97.3  --   PLT 75*  --     Basic Metabolic Panel: Recent Labs  Lab 02/24/21 0548 02/24/21 0607  NA 130* 132*  K 4.9 5.0  CL 104 103  CO2 17*  --   GLUCOSE 152* 147*  BUN 25* 24*  CREATININE 1.11 0.90  CALCIUM 8.7*  --    GFR: Estimated Creatinine Clearance: 98.6 mL/min (by C-G formula based on SCr of 0.9 mg/dL). Recent Labs  Lab 02/24/21 0548  WBC 7.2  LATICACIDVEN 4.0*    Liver Function Tests: Recent Labs  Lab 02/24/21 0548  AST 139*  ALT 53*  ALKPHOS 349*  BILITOT 9.0*  PROT 6.6  ALBUMIN 2.9*   No results for input(s): LIPASE, AMYLASE in the last 168 hours. Recent Labs  Lab 02/24/21 0548  AMMONIA 234*    ABG    Component Value Date/Time   HCO3 16.9 (L) 02/24/2021 0600   TCO2 19 (L) 02/24/2021 0607   ACIDBASEDEF 6.5 (H) 02/24/2021 0600   O2SAT 88.7 02/24/2021 0600     Coagulation Profile: Recent Labs  Lab 02/24/21 0548  INR 7.9*    Cardiac Enzymes: No results for input(s): CKTOTAL, CKMB, CKMBINDEX, TROPONINI in the last 168 hours.  HbA1C: Hemoglobin A1C  Date/Time Value Ref Range Status  01/17/2017 10:41 AM 5.9  Final  01/01/2016 11:26 AM 6.1  Final   Hgb A1c MFr Bld  Date/Time  Value Ref Range Status  02/04/2021 03:57 PM 4.3 (L) 4.8 - 5.6 % Final    Comment:             Prediabetes: 5.7 - 6.4          Diabetes: >6.4          Glycemic control for adults with diabetes: <7.0     CBG: No results for input(s): GLUCAP in the last 168 hours.  Review of Systems:   Unable to gather secondary to altered mental status  Past Medical History:  He,  has a past medical history of Anxiety, Cirrhosis ([place]), and COVID-19.   Surgical History:   Past Surgical History:  Procedure Laterality Date  . ANKLE SURGERY Right   . IR PARACENTESIS  01/07/2021  . IR PARACENTESIS  01/30/2021  . IR TRANSCATHETER BX  01/09/2021  . IR US GUIDE VASC ACCESS RIGHT   01/09/2021  . IR VENOGRAM HEPATIC W HEMODYNAMIC EVALUATION  01/09/2021     Social History:   reports that he quit smoking about 18 years ago. His smoking use included cigarettes. He has never used smokeless tobacco. He reports previous alcohol use. He reports that he does not use drugs.   Family History:  His family history includes Diabetes in his cousin, father, and mother; Memory loss in his mother.   Allergies Allergies  Allergen Reactions  . Hydrocodone Itching and Other (See Comments)    Noted with hydrocodone cough syrup. Itching only, no hives.  02/03/16: denied itching with recent hydrocodone cough syrup. Only one episode of itching previously.      Home Medications  Prior to Admission medications   Medication Sig Start Date End Date Taking? Authorizing Provider  dicyclomine (BENTYL) 10 MG capsule Take 1 capsule (10 mg total) by mouth 4 (four) times daily -  before meals and at bedtime. Patient not taking: Reported on 02/24/2021 12/16/20   Thornton Park, MD  folic acid (FOLVITE) 1 MG tablet Take 1 tablet (1 mg total) by mouth daily. 12/29/20   Shelly Coss, MD  furosemide (LASIX) 40 MG tablet Take 1 tablet (40 mg total) by mouth daily. 01/29/21   Esterwood, Amy S, PA-C  hydrOXYzine (ATARAX/VISTARIL) 10 MG tablet Take 0.5-1 tablets (5-10 mg total) by mouth at bedtime as needed for itching. 02/04/21   Maximiano Coss, NP  lactulose (CEPHULAC) 10 g packet Take 3 packets (30 g total) by mouth 3 (three) times daily. 01/29/21   Esterwood, Amy S, PA-C  rifaximin (XIFAXAN) 550 MG TABS tablet Take 1 tablet (550 mg total) by mouth 2 (two) times daily. 01/29/21   Esterwood, Amy S, PA-C  spironolactone (ALDACTONE) 100 MG tablet Take 1 tablet (100 mg total) by mouth daily. 01/29/21   Esterwood, Amy S, PA-C  thiamine 100 MG tablet Take 1 tablet (100 mg total) by mouth daily. Patient not taking: No sig reported 12/29/20   Shelly Coss, MD  warfarin (COUMADIN) 2 MG tablet Take 1 tablet (2 mg total)  by mouth daily. Start after you talk with your PCP/after your INR has been checked 02/13/21 03/15/21  Jonetta Osgood, MD     Critical care time:    Performed by: Johnsie Cancel  Total critical care time: 55 minutes  Critical care time was exclusive of separately billable procedures and treating other patients.  Critical care was necessary to treat or prevent imminent or life-threatening deterioration.  Critical care was time spent personally by me on the following activities: development of  treatment plan with patient and/or surrogate as well as nursing, discussions with consultants, evaluation of patient's response to treatment, examination of patient, obtaining history from patient or surrogate, ordering and performing treatments and interventions, ordering and review of laboratory studies, ordering and review of radiographic studies, pulse oximetry and re-evaluation of patient's condition.  Johnsie Cancel, NP-C [facility] Personal contact information can be found on Amion  If no response please page: Adult pulmonary and critical care medicine pager on Amion unitl 7pm After 7pm please call [phone] 02/24/2021, 10:14 AM

## 2021-02-24 NOTE — Progress Notes (Signed)
[facility] Progress Note Patient Name: Ian Hall DOB: 30-Jul-1966 MRN: [id number]   Date of Service  02/24/2021  HPI/Events of Note  Hyperglycemia - Blood glucose = 112. Nursing request for Q 4 hour POC CBGs.   eICU Interventions  Will order Q 4 hour POC CBGs.     Intervention Category Major Interventions: Hyperglycemia - active titration of insulin therapy  Lysle Dingwall 02/24/2021, 10:59 PM

## 2021-02-24 NOTE — Procedures (Signed)
HISTORY The patient is a 55 year old man with a history of NASH cirrhosis who is being evaluated for AMS and seizure-like activity requiring intubation in the setting of hyperammonemia.   TECHNICAL This study was performed according to the 10-20 International System of Electrode Placement. This record was reviewed under the following settings: bandpass filters of 1-70 Hz, sensitivity of 7 uV/mm, a display speed of 30 mm/sec, with a 60 Hz notched filter applied as appropriate.   MEDICATIONS Fentanyl gtt, Keppra    FINDINGS This study was recorded in the comatose state. A diffuse low amplitude dominant background rhythm of 3-4  Hz is seen. No focal, lateralizating, or epileptiform discharges are seen.   HYPERVENTILATION: Not performed PHOTIC STIMULATION: Not performed   IMPRESSION This is an abnormal study recorded in the comatose state. Diffuse slowing of the background activity reflects a severe degree of diffuse cortical dysfunction, which can be seen with toxic-metabolic or systemic causes. No focal, lateralizating, or epileptiform discharges are seen.

## 2021-02-24 NOTE — Progress Notes (Signed)
EEG Completed; Results Pending  

## 2021-02-24 NOTE — Progress Notes (Signed)
  Echocardiogram 2D Echocardiogram has been performed.  Ian Hall 02/24/2021, 2:22 PM

## 2021-02-24 NOTE — ED Triage Notes (Signed)
Pt to ED by EMS from home following a seizure. Pt has had one other seizure approx 1 month ago, other than that, no HX. Pt vomited bright red blood and brown clots on route to the ED. Pt responds to painful stimuli only. Skin is yellow in appearance, as are his eye. VSS.

## 2021-02-24 NOTE — Progress Notes (Signed)
Patient belongings given to wife-Norma- at bedisde. Included medications, cell phone, glasses and jewelry (two rings, one bracelet and one necklace).   Family inquired about patient wallet. RN searched patient belongings that came to ICU and called emergency room. Wallet was not in belongings or seen by ED RN. Family informed and told RN they will check at home.

## 2021-02-24 NOTE — Progress Notes (Addendum)
Pharm[facility]: [facility]  Patient's a 55 y.o with hx cirrhosis and DVT on warfarin PTA presented to the ED on 3/29 with c/o seizure, hematemesis, and AMS.  INR was supra-therapeutic at 7.9 on admission.  Warfarin was reverse with KCentra 2000 units IV x1 and vit K 78m IV x1.  INR obtained one hour after KCentra dose came back as 1.8. D/W WMerlene Laughter With patient being somewhat stable at this time, will hold off on additional reversal therapy for now.  Will check INR q6h x4 and then daily x2 post KCentra dose per protocol.  ADia Sitter PharmD, BCPS 02/24/2021 11:35 AM   ______________________________ Adden: Our updated reversal protocol now recom to only check INR daily x2 after obtaining the lab one hour after administering KCentra. Will check INR on 3/30 and 3/31.  ADia Sitter PharmD, BCPS 02/24/2021 2:19 PM

## 2021-02-24 NOTE — Procedures (Signed)
Intubation Procedure Note  Ian Hall  [id number]  1966-05-28  Date:02/24/21  Time:1:09 PM   Provider Performing:Samule Life F Rosana Hoes    Procedure: Intubation (31500)  Indication(s) Respiratory Failure  Consent Risks of the procedure as well as the alternatives and risks of each were explained to the patient and/or caregiver.  Consent for the procedure was obtained and is signed in the bedside chart   Anesthesia Etomidate and Versed   Time Out Verified patient identification, verified procedure, site/side was marked, verified correct patient position, special equipment/implants available, medications/allergies/relevant history reviewed, required imaging and test results available.   Sterile Technique Usual hand hygeine, masks, and gloves were used   Procedure Description Patient positioned in bed supine.  Sedation given as noted above.  Patient was intubated with endotracheal tube using Glidescope.  View was Grade 1 full glottis .  Number of attempts was 1.  Colorimetric CO2 detector was consistent with tracheal placement.   Complications/Tolerance None; patient tolerated the procedure well. Chest X-ray is ordered to verify placement.   EBL Minimal   Specimen(s) None   Ian Cancel, NP-C [facility] Personal contact information can be found on Amion  If no response please page: Adult pulmonary and critical care medicine pager on Amion unitl 7pm After 7pm please call [phone] 02/24/2021, 1:10 PM

## 2021-02-24 NOTE — Progress Notes (Signed)
RT NOTE:  Pt transported from ED to ICU with no complications noted. Vitals are stable at this time, RT will continue to monitor.

## 2021-02-24 NOTE — ED Notes (Signed)
Pt intubated at this time.   

## 2021-02-25 DIAGNOSIS — K7290 Hepatic failure, unspecified without coma: Secondary | ICD-10-CM | POA: Diagnosis not present

## 2021-02-25 DIAGNOSIS — J9600 Acute respiratory failure, unspecified whether with hypoxia or hypercapnia: Secondary | ICD-10-CM | POA: Diagnosis not present

## 2021-02-25 DIAGNOSIS — K7682 Hepatic encephalopathy: Secondary | ICD-10-CM | POA: Diagnosis present

## 2021-02-25 DIAGNOSIS — K922 Gastrointestinal hemorrhage, unspecified: Secondary | ICD-10-CM | POA: Diagnosis not present

## 2021-02-25 LAB — BLOOD GAS, ARTERIAL
Acid-base deficit: 7.4 mmol/L — ABNORMAL HIGH (ref 0.0–2.0)
Bicarbonate: 18 mmol/L — ABNORMAL LOW (ref 20.0–28.0)
FIO2: 40
O2 Saturation: 97.1 %
Patient temperature: 98.6
pCO2 arterial: 37.8 mmHg (ref 32.0–48.0)
pH, Arterial: 7.299 — ABNORMAL LOW (ref 7.350–7.450)
pO2, Arterial: 107 mmHg (ref 83.0–108.0)

## 2021-02-25 LAB — HEPATITIS PANEL, ACUTE
HCV Ab: NONREACTIVE
Hep A IgM: NONREACTIVE
Hep B C IgM: NONREACTIVE
Hepatitis B Surface Ag: NONREACTIVE

## 2021-02-25 LAB — URINALYSIS, ROUTINE W REFLEX MICROSCOPIC
Bacteria, UA: NONE SEEN
Bilirubin Urine: NEGATIVE
Glucose, UA: NEGATIVE mg/dL
Ketones, ur: NEGATIVE mg/dL
Leukocytes,Ua: NEGATIVE
Nitrite: NEGATIVE
Protein, ur: NEGATIVE mg/dL
Specific Gravity, Urine: 1.024 (ref 1.005–1.030)
pH: 5 (ref 5.0–8.0)

## 2021-02-25 LAB — CBC
HCT: 36.1 % — ABNORMAL LOW (ref 39.0–52.0)
HCT: 37.5 % — ABNORMAL LOW (ref 39.0–52.0)
Hemoglobin: 12.4 g/dL — ABNORMAL LOW (ref 13.0–17.0)
Hemoglobin: 12.5 g/dL — ABNORMAL LOW (ref 13.0–17.0)
MCH: 34.3 pg — ABNORMAL HIGH (ref 26.0–34.0)
MCH: 34.2 pg — ABNORMAL HIGH (ref 26.0–34.0)
MCHC: 34.3 g/dL (ref 30.0–36.0)
MCHC: 33.3 g/dL (ref 30.0–36.0)
MCV: 99.7 fL (ref 80.0–100.0)
MCV: 102.7 fL — ABNORMAL HIGH (ref 80.0–100.0)
Platelets: 74 10*3/uL — ABNORMAL LOW (ref 150–400)
Platelets: 84 10*3/uL — ABNORMAL LOW (ref 150–400)
RBC: 3.62 MIL/uL — ABNORMAL LOW (ref 4.22–5.81)
RBC: 3.65 MIL/uL — ABNORMAL LOW (ref 4.22–5.81)
RDW: 15.5 % (ref 11.5–15.5)
RDW: 15.8 % — ABNORMAL HIGH (ref 11.5–15.5)
WBC: 10.1 10*3/uL (ref 4.0–10.5)
WBC: 11.6 10*3/uL — ABNORMAL HIGH (ref 4.0–10.5)
nRBC: 0 % (ref 0.0–0.2)
nRBC: 0 % (ref 0.0–0.2)

## 2021-02-25 LAB — GLUCOSE, CAPILLARY
Glucose-Capillary: 116 mg/dL — ABNORMAL HIGH (ref 70–99)
Glucose-Capillary: 109 mg/dL — ABNORMAL HIGH (ref 70–99)
Glucose-Capillary: 98 mg/dL (ref 70–99)
Glucose-Capillary: 119 mg/dL — ABNORMAL HIGH (ref 70–99)
Glucose-Capillary: 114 mg/dL — ABNORMAL HIGH (ref 70–99)

## 2021-02-25 LAB — BASIC METABOLIC PANEL
Anion gap: 8 (ref 5–15)
BUN: 31 mg/dL — ABNORMAL HIGH (ref 6–20)
CO2: 17 mmol/L — ABNORMAL LOW (ref 22–32)
Calcium: 8.5 mg/dL — ABNORMAL LOW (ref 8.9–10.3)
Chloride: 107 mmol/L (ref 98–111)
Creatinine, Ser: 1.25 mg/dL — ABNORMAL HIGH (ref 0.61–1.24)
GFR, Estimated: >60 mL/min (ref >60)
Glucose, Bld: 116 mg/dL — ABNORMAL HIGH (ref 70–99)
Potassium: 5.1 mmol/L (ref 3.5–5.1)
Sodium: 132 mmol/L — ABNORMAL LOW (ref 135–145)

## 2021-02-25 LAB — PHOSPHORUS
Phosphorus: 4.6 mg/dL (ref 2.5–4.6)
Phosphorus: 4.4 mg/dL (ref 2.5–4.6)
Phosphorus: 4.5 mg/dL (ref 2.5–4.6)

## 2021-02-25 LAB — PROTIME-INR
INR: 1.8 — ABNORMAL HIGH (ref 0.8–1.2)
Prothrombin Time: 20.2 seconds — ABNORMAL HIGH (ref 11.4–15.2)

## 2021-02-25 LAB — AMMONIA: Ammonia: 121 umol/L — ABNORMAL HIGH (ref 9–35)

## 2021-02-25 LAB — LACTIC ACID, PLASMA: Lactic Acid, Venous: 1.3 mmol/L (ref 0.5–1.9)

## 2021-02-25 LAB — MAGNESIUM
Magnesium: 1.7 mg/dL (ref 1.7–2.4)
Magnesium: 1.9 mg/dL (ref 1.7–2.4)
Magnesium: 1.9 mg/dL (ref 1.7–2.4)

## 2021-02-25 LAB — TRIGLYCERIDES: Triglycerides: 96 mg/dL (ref <150)

## 2021-02-25 MED ORDER — STERILE WATER FOR INJECTION IV SOLN
INTRAVENOUS | Status: AC
  Filled 2021-02-25: qty 150

## 2021-02-25 MED ORDER — SODIUM CHLORIDE 0.9 % IV SOLN
2.0000 g | INTRAVENOUS | Status: AC
  Administered 2021-02-25 – 2021-02-26 (×2): 2 g via INTRAVENOUS
  Filled 2021-02-25 (×2): qty 20

## 2021-02-25 MED ORDER — VITAL HIGH PROTEIN PO LIQD
1000.0000 mL | ORAL | Status: DC
  Administered 2021-02-25: 1000 mL

## 2021-02-25 MED ORDER — RIFAXIMIN 550 MG PO TABS
550.0000 mg | ORAL_TABLET | Freq: Two times a day (BID) | ORAL | Status: DC
  Administered 2021-02-25 – 2021-02-27 (×5): 550 mg
  Filled 2021-02-25 (×6): qty 1

## 2021-02-25 MED ORDER — PROSOURCE TF PO LIQD
45.0000 mL | Freq: Two times a day (BID) | ORAL | Status: DC
  Administered 2021-02-25 – 2021-02-27 (×5): 45 mL
  Filled 2021-02-25 (×5): qty 45

## 2021-02-25 MED ORDER — VITAL HIGH PROTEIN PO LIQD: 1000.0000 mL | ORAL | Status: DC

## 2021-02-25 MED ORDER — SODIUM CHLORIDE 0.9 % IV SOLN
INTRAVENOUS | Status: DC | PRN
  Administered 2021-02-25: 500 mL via INTRAVENOUS

## 2021-02-25 MED ORDER — LACTULOSE 10 GM/15ML PO SOLN
30.0000 g | Freq: Three times a day (TID) | ORAL | Status: DC
  Administered 2021-02-25 – 2021-02-27 (×7): 30 g
  Filled 2021-02-25 (×7): qty 45

## 2021-02-25 NOTE — Progress Notes (Addendum)
NAME:  Ian Hall, MRN:  [id number], DOB:  [birth year], LOS: 1 ADMISSION DATE:  02/24/2021, CONSULTATION DATE: 02/24/2021 REFERRING MD:    Dr. Sedonia Small, CHIEF COMPLAINT: Seizures with hematemesis  History of Present Illness:  55 year old male who presented to [facility] on 3/29 via EMS with AMS and possible seizure like activity.   He has a PMH of decompensated cirrhosis, alcohol abuse, incidental COVID (positive 02/06/21), lower extremity DVT anticoagulated with Coumadin and anxiety.  Wife reported on admit she saw him early in the am with decreased mentation, possible seizure activity, and bleeding from his mouth.  Per EMS patient remained largely unresponsive and had episode of hematemesis in route to ED. He had snoring respirations on presentation and abnormal motor movements of the RLE concerning for seizure. He was intubated for airway protection.  Pertinent  Medical History  Decompensated cirrhosis ETOH Abuse  COVID Lower extremity DVT anticoagulated with Coumadin Anxiety  Significant Hospital Events: Including procedures, antibiotic start and stop dates in addition to other pertinent events   . 3/29 admitted with altered mental status and questionable seizures with hematemesis, intubated for airway protection in ED . 3/30 Remains intubated, on propofol / fentanyl. Increased lactulose.   Interim History / Subjective:  Afebrile  On vent - 40% / PEEP 5  I/O 1L UOP, no stool output documented  Glucose range 109-116 On propofol / fentanyl gtts   Objective   Blood pressure (!) 124/59, pulse 73, temperature 98.1 F (36.7 C), temperature source Axillary, resp. rate 17, height 5' 5"  (1.651 m), weight 93.6 kg, SpO2 100 %.    Vent Mode: PRVC FiO2 (%):  [40 %] 40 % Set Rate:  [16 bmp] 16 bmp Vt Set:  [490 mL] 490 mL PEEP:  [5 cmH20] 5 cmH20 Plateau Pressure:  [15 cmH20-23 cmH20] 21 cmH20   Intake/Output Summary (Last 24 hours) at 02/25/2021 1024 Last data filed at 02/25/2021 1000 Gross per  24 hour  Intake 1481.17 ml  Output 1000 ml  Net 481.17 ml   Filed Weights   02/24/21 0537 02/25/21 0446  Weight: 93.4 kg 93.6 kg    Examination: General: chronically ill appearing adult male lying in bed in NAD on vent    HEENT: MM pink/moist, ETT, scleral icterus noted, pupils 50m reactive Neuro: sedate, attempts to push light away when checking pupils CV: s1s2 RRR, no m/r/g PULM: non-labored on vent, irregular volumes on PRVC, lungs bilaterally diminished  GI: soft, bsx4 hypoactive, gastric tube in place, mild distention  Extremities: warm/dry, no LE edema  Skin: no rashes or lesions        Resolved Hospital Problem list     Assessment & Plan:   Acute Respiratory Insufficiency in setting of Encephalopathy  Secondary to metabolic encephalopathy and possible seizure-like activity  -PRVC 8cc/kg  -wean PEEP / FiO2 for sats >90% -SBT / WUA as tolerated  -VAP prevention measures  -follow CXR intermittently  -PAD protocol, RASS goal 0 to -1   Decompensated NASH/EtOH Cirrhosis Alcoholic hepatitis MELD score 25, failed trial of steroids at time of diagnosis. Home meds include Lasix and Aldactone.  Stopped drinking in 10/2020.  Recently underwent upper and lower endoscopies with GI 12/30/2020 gross abnormalities or varices identified. Has been referred to [facility].  -appreciate GI assessment > last seen 3/29  -stop octreotide -lactulose increased to 30 mg 3/30 -assess acute hepatitis panel, repeat ammonia  -follow LFT's -resume rifaxamin  -continue rocephin for empiric SBP coverage  Acute Hepatic Encephalopathy  Seizures  Ammonia on Admission 234 > 121 on 3/30. Wife reports seizure activity prior to admission patient was also seen with rhythmic right lower extremity leg tension. CT head negative. EEG with diffuse slowing of the background activity -trend ammonia  -continue keppra -seizure precautions  -EEG as above -delirium prevention measures   Upper GI bleed vs  Tongue Laceration  Coagulopathy Elevated INR Anemia  Thrombocytopenia Presented with bright red blood oral cavity and episode of hematemesis with EMS.  INR 7.9 on admission on home Coumadin.  Also noted to have tongue laceration. Received vitamin K and KCentra -appreciate GI assistance  -SCD's for DVT prophylaxis  -hold home warfarin > on for DVT dx 01/2021 -transfuse for Hgb<7% -monitor for bleeding -begin TF    NGMA  -sodium bicarbonate infusion at 50 ml/hr for 1L   Hyponatremia Hypomagnesemia -Trend BMP / urinary output -Replace electrolytes as indicated -Avoid nephrotoxic agents, ensure adequate renal perfusion  Lactic acidosis -follow up lactate 3/30  -supportive care > likely some element of liver dysfunction / poor clearance   Diagnosis lower extremity DVT anticoagulated with Coumadin at baseline -hold anticoagulation in setting of GIB  Best practice   Diet:  NPO Pain/Anxiety/Delirium protocol (if indicated): Yes (RASS goal -1) VAP protocol (if indicated): Yes DVT prophylaxis: Contraindicated GI prophylaxis: PPI Glucose control:  SSI No Central venous access:  N/A Arterial line:  N/A Foley:  N/A Mobility:  bed rest  PT consulted: N/A Last date of multidisciplinary goals of care discussion:  Will update wife on arrival 3/30.  Code Status:  full code Disposition: ICU  Labs   CBC: Recent Labs  Lab 02/24/21 0548 02/24/21 0607 02/24/21 1149 02/24/21 1727 02/25/21 0032 02/25/21 0726  WBC 7.2  --  6.5 9.2 10.1 11.6*  HGB 12.7* 12.2* 11.7* 12.0* 12.4* 12.5*  HCT 36.5* 36.0* 33.8* 35.0* 36.1* 37.5*  MCV 97.3  --  100.0 100.3* 99.7 102.7*  PLT 75*  --  63* 69* 74* 84*    Basic Metabolic Panel: Recent Labs  Lab 02/24/21 0548 02/24/21 0607 02/25/21 0032  NA 130* 132* 132*  K 4.9 5.0 5.1  CL 104 103 107  CO2 17*  --  17*  GLUCOSE 152* 147* 116*  BUN 25* 24* 31*  CREATININE 1.11 0.90 1.25*  CALCIUM 8.7*  --  8.5*  MG  --   --  1.7  PHOS  --   --   4.6   GFR: Estimated Creatinine Clearance: 71 mL/min (A) (by C-G formula based on SCr of 1.25 mg/dL (H)). Recent Labs  Lab 02/24/21 0548 02/24/21 1149 02/24/21 1727 02/25/21 0032 02/25/21 0726  WBC 7.2 6.5 9.2 10.1 11.6*  LATICACIDVEN 4.0* 2.0*  --   --   --     Liver Function Tests: Recent Labs  Lab 02/24/21 0548  AST 139*  ALT 53*  ALKPHOS 349*  BILITOT 9.0*  PROT 6.6  ALBUMIN 2.9*   No results for input(s): LIPASE, AMYLASE in the last 168 hours. Recent Labs  Lab 02/24/21 0548 02/24/21 1149 02/25/21 0032  AMMONIA 234* 138* 121*    ABG    Component Value Date/Time   PHART 7.299 (L) 02/25/2021 0515   PCO2ART 37.8 02/25/2021 0515   PO2ART 107 02/25/2021 0515   HCO3 18.0 (L) 02/25/2021 0515   TCO2 19 (L) 02/24/2021 0607   ACIDBASEDEF 7.4 (H) 02/25/2021 0515   O2SAT 97.1 02/25/2021 0515     Coagulation Profile: Recent Labs  Lab 02/24/21 0548 02/24/21 1017 02/25/21 0032  INR 7.9* 1.8* 1.8*    Cardiac Enzymes: No results for input(s): CKTOTAL, CKMB, CKMBINDEX, TROPONINI in the last 168 hours.  HbA1C: Hemoglobin A1C  Date/Time Value Ref Range Status  01/17/2017 10:41 AM 5.9  Final  01/01/2016 11:26 AM 6.1  Final   Hgb A1c MFr Bld  Date/Time Value Ref Range Status  02/04/2021 03:57 PM 4.3 (L) 4.8 - 5.6 % Final    Comment:             Prediabetes: 5.7 - 6.4          Diabetes: >6.4          Glycemic control for adults with diabetes: <7.0     CBG: Recent Labs  Lab 02/24/21 2006 02/24/21 2310 02/25/21 0357 02/25/21 0820  GLUCAP 112* 116* 116* 109*      Critical care time: 35 minutes    Noe Gens, MSN, APRN, NP-C, AGACNP-BC [facility] 02/25/2021, 10:24 AM   Please see [email or web address] for pager details.   From 7A-7P if no response, please call [phone] After hours, please call ELink [phone]

## 2021-02-25 NOTE — Progress Notes (Signed)
Initial Nutrition Assessment  DOCUMENTATION CODES:   Obesity unspecified  INTERVENTION:  - will order TF: Vital High Protein @ 50 ml/hr with 45 ml Prosource TF BID. - this regimen provides 1280 kcal, 127 grams protein, and 1003 ml free water.  - free water flush, if desired, to be per CCM.   NUTRITION DIAGNOSIS:   Inadequate oral intake related to inability to eat as evidenced by NPO status.  GOAL:   Provide needs based on ASPEN/SCCM guidelines  MONITOR:   Vent status,TF tolerance,Labs,Weight trends  REASON FOR ASSESSMENT:   Ventilator,Consult Enteral/tube feeding initiation and management  ASSESSMENT:   55 year old male with medical history of  NASH and alcohol-related cirrhosis, abstinent/sober since 10/2020, hyponatremia with admission earlier this month when he was incidentally found to be COVID-19 positive and have LE DVT; placed on warfarin.  He is admitted now with acute progressive encephalopathy, poorly responsive, and seizure activity. Family reported that he was more confused and lethargic for a few days, per his wife.  No family/visitors present. Able to talk with RN and CCM. NGT in L nare and is currently clamped with plan to start TF today.   Weight today and yesterday is 206 lb, weight on 3/2 was 219 lb which indicates 13 lb weight loss (6% body weight) in the past <1 month. Significant for time frame but also noted patient with cirrhosis hx and current order in place for paracentesis so previous weight could have been influenced by fluid.   Per notes: - acute respiratory insufficiency - decompensated NASH/alcoholic cirrhosis, alcoholic hepatitis - acute hepatic encephalopathy - UGIB vs tongue laceration - lactic acidosis   Patient is currently intubated on ventilator support MV: 7.7 L/min Temp (24hrs), Avg:97.6 F (36.4 C), Min:97.2 F (36.2 C), Max:98.1 F (36.7 C) Propofol: 5.6 ml/hr (148 kcal/24 hours) BP: 119/63 and MAP: 81  Labs reviewed;  CBGs: 116, 109, 98 mg/dl, Na: 132 mmol/l, BUN: 31 mg/dl, creatinine: 1.25 mg/dl, Ca: 8.5 mg/dl, ammonia: 121 umol/l. Medications reviewed; 100 mg colace BID, 30 g lactulose TID, 40 mg IV protonix BID, 17 g miralax/day  Drips; fentanyl @ 75 mcg/hr, propofol @ 10 mcg/kg/min, octreotide @ 50 mg/hr. IVF; 150 mEq sodium bicarb in sterile water @ 50 ml/hr.    NUTRITION - FOCUSED PHYSICAL EXAM:  completed; no fat depletions, no muscle depletions, mild BLE edema.   Diet Order:   Diet Order            Diet NPO time specified  Diet effective now                 EDUCATION NEEDS:   Not appropriate for education at this time  Skin:  Skin Assessment: Reviewed RN Assessment  Last BM:  PTA/unknown  Height:   Ht Readings from Last 1 Encounters:  02/24/21 5' 5"  (1.651 m)    Weight:   Wt Readings from Last 1 Encounters:  02/25/21 93.6 kg     Estimated Nutritional Needs:  Kcal:  1030-1310 kcal Protein:  >/= 124 grams Fluid:  >/= 1.7 L/day      Jarome Matin, MS, RD, LDN, CNSC Inpatient Clinical Dietitian RD pager # available in [place]  After hours/weekend pager # available in [facility]

## 2021-02-25 NOTE — Progress Notes (Signed)
Chaplain engaged in a follow-up visit with [name], Gerrod's wife, and was able to meet their son, [name], today.  Chaplain continues to offer support as they are at the bedside of Ian Hall.      02/25/21 1200  Clinical Encounter Type  Visited With Patient and family together  Visit Type Follow-up

## 2021-02-25 NOTE — Progress Notes (Addendum)
Attending physician's note   I have taken an interval history, reviewed the chart and examined the patient. I agree with the Advanced Practitioner's note, impression, and recommendations as outlined.   Patient remains intubated and sedated.  While he could have had some mucosal oozing given supratherapeutic INR and history of portal hypertensive gastritis, per discussion with staff, appears to be more likely tongue laceration during seizure episode rather than true UGIB.  CT A/P in 11/2020 also without varices, but subsequent MR abdomen in 12/2020 with small gastroesophageal and paraumbilical varices, but subsequent EGD last month without varices.  -Continue max supportive care as currently doing -Agree with stopping octreotide -Can complete 5 days of Rocephin for SBP prophylaxis -Continuing lactulose, rifaximin -H/H stable.  No plan for endoscopic intervention at this juncture -GI service will continue to follow intermittently  [place], DO, [place] [phone] office              [facility] Progress Note  CC:  Hematemesis, elevated ammonia, cirrhosis  Subjective:  No further signs of active bleeding.  Remains intubated and sedated.  Objective:  Vital signs in last 24 hours: Temp:  [96.1 F (35.6 C)-98.1 F (36.7 C)] 98.1 F (36.7 C) (03/30 0800) Pulse Rate:  [61-87] 73 (03/30 0700) Resp:  [13-19] 17 (03/30 0700) BP: (95-127)/(47-82) 124/59 (03/30 0700) SpO2:  [99 %-100 %] 100 % (03/30 0630) FiO2 (%):  [40 %] 40 % (03/30 0829) Weight:  [93.6 kg] 93.6 kg (03/30 0446) Last BM Date:  (PTA) General:  Intubated and sedated. Heart:  Regular rate and rhythm; no murmurs Pulm:  CTAB.  No W/R/R. Abdomen:  Soft, non-distended.  BS present. Extremities:  Without edema.  Intake/Output from previous day: 03/29 0701 - 03/30 0700 In: 2379.7 [I.V.:865.8; NG/GT:100; IV Piggyback:1413.9] Out: 1000 [Urine:1000] Intake/Output this shift: Total I/O In: 28.6  [I.V.:28.6] Out: -   Lab Results: Recent Labs    02/24/21 1727 02/25/21 0032 02/25/21 0726  WBC 9.2 10.1 11.6*  HGB 12.0* 12.4* 12.5*  HCT 35.0* 36.1* 37.5*  PLT 69* 74* 84*   BMET Recent Labs    02/24/21 0548 02/24/21 0607 02/25/21 0032  NA 130* 132* 132*  K 4.9 5.0 5.1  CL 104 103 107  CO2 17*  --  17*  GLUCOSE 152* 147* 116*  BUN 25* 24* 31*  CREATININE 1.11 0.90 1.25*  CALCIUM 8.7*  --  8.5*   LFT Recent Labs    02/24/21 0548  PROT 6.6  ALBUMIN 2.9*  AST 139*  ALT 53*  ALKPHOS 349*  BILITOT 9.0*   PT/INR Recent Labs    02/24/21 1017 02/25/21 0032  LABPROT 20.1* 20.2*  INR 1.8* 1.8*   CT HEAD WO CONTRAST  Result Date: 02/24/2021 CLINICAL DATA:  Seizure EXAM: CT HEAD WITHOUT CONTRAST TECHNIQUE: Contiguous axial images were obtained from the base of the skull through the vertex without intravenous contrast. COMPARISON:  None. FINDINGS: Brain: Normal anatomic configuration. No abnormal intra or extra-axial mass lesion or fluid collection. No abnormal mass effect or midline shift. No evidence of acute intracranial hemorrhage or infarct. Ventricular size is normal. Cerebellum unremarkable. Vascular: Unremarkable Skull: Intact Sinuses/Orbits: Paranasal sinuses are clear. Orbits are unremarkable. Other: Mastoid air cells and middle ear cavities are clear. IMPRESSION: No acute intracranial abnormality. Electronically Signed   By: Fidela Salisbury MD   On: 02/24/2021 06:24   DG Chest Port 1 View  Result Date: 02/24/2021 CLINICAL DATA:  Hypoxia EXAM: PORTABLE CHEST 1 VIEW COMPARISON:  February 24, 2021 study obtained earlier in the day FINDINGS: Endotracheal tube tip is 3.2 cm above the carina. Nasogastric tube coils in the upper esophagus with tube extending back into the patient's pharynx. The tip of the nasogastric tube is to the right of the endotracheal tube at the C5 level. No appreciable central catheter. No edema or airspace opacity. Heart size and pulmonary vascular  normal. No adenopathy. No bone lesions. IMPRESSION: Tube positions as described. Note that the nasogastric tube tip is in the cervical region at the level of C5 after coiling in the upper thoracic esophagus and extending back into the pharynx region. No central catheter seen. Lungs clear.  Cardiac silhouette normal. These results were called by telephone at the time of interpretation on 02/24/2021 at 9:00 am to provider Domenic Moras, who verbally acknowledged these results. Electronically Signed   By: Lowella Grip III M.D.   On: 02/24/2021 09:01   DG Chest Port 1 View  Result Date: 02/24/2021 CLINICAL DATA:  Altered mental status, hemoptysis EXAM: PORTABLE CHEST 1 VIEW COMPARISON:  02/06/2021 FINDINGS: Lung volumes are small, but are symmetric. Patchy infiltrate is developed within the left suprahilar region, possibly infectious in the acute setting. Mild left basilar atelectasis or infiltrate. No pneumothorax or pleural effusion. Cardiac size within normal limits. IMPRESSION: Pulmonary hypoinflation. Superimposed patchy left suprahilar pulmonary infiltrate may be infectious in etiology. Electronically Signed   By: Fidela Salisbury MD   On: 02/24/2021 06:05   EEG adult  Result Date: 02/24/2021 Serita Grammes, MD     02/24/2021  1:56 PM HISTORY The patient is a 55 year old man with a history of NASH cirrhosis who is being evaluated for AMS and seizure-like activity requiring intubation in the setting of hyperammonemia.  TECHNICAL This study was performed according to the 10-20 International System of Electrode Placement. This record was reviewed under the following settings: bandpass filters of 1-70 Hz, sensitivity of 7 uV/mm, a display speed of 30 mm/sec, with a 60 Hz notched filter applied as appropriate.  MEDICATIONS Fentanyl gtt, Keppra  FINDINGS This study was recorded in the comatose state. A diffuse low amplitude dominant background rhythm of 3-4  Hz is seen. No focal, lateralizating, or epileptiform  discharges are seen.  HYPERVENTILATION: Not performed PHOTIC STIMULATION: Not performed  IMPRESSION This is an abnormal study recorded in the comatose state. Diffuse slowing of the background activity reflects a severe degree of diffuse cortical dysfunction, which can be seen with toxic-metabolic or systemic causes. No focal, lateralizating, or epileptiform discharges are seen.   ECHOCARDIOGRAM COMPLETE  Result Date: 02/24/2021    ECHOCARDIOGRAM REPORT   Patient Name:   JAYQUAN BRADSHER Date of Exam: 02/24/2021 Medical Rec #:  [id number]     Height:       65.0 in Accession #:    [id number]    Weight:       205.9 lb Date of Birth:  [birth year]     BSA:          2.003 m Patient Age:    79 years      BP:           116/67 mmHg Patient Gender: M             HR:           65 bpm. Exam Location:  Inpatient Procedure: 2D Echo, 3D Echo, Cardiac Doppler and Color Doppler Indications:    R94.31 Abnormal EKG  History:  Patient has no prior history of Echocardiogram examinations.                 Signs/Symptoms:Dyspnea, Shortness of Breath and Altered Mental                 Status. ETOH. Covid infection 2 weeks ago.  Sonographer:    Roseanna Rainbow RDCS Referring Phys: [phone] [facility] F DAVIS  Sonographer Comments: Echo performed with patient supine and on artificial respirator. Image acquisition challenging due to respiratory motion. IMPRESSIONS  1. Left ventricular ejection fraction, by estimation, is 60 to 65%. The left ventricle has normal function. The left ventricle has no regional wall motion abnormalities. There is mild concentric left ventricular hypertrophy. Left ventricular diastolic parameters are consistent with Grade I diastolic dysfunction (impaired relaxation).  2. Right ventricular systolic function is normal. The right ventricular size is normal.  3. The mitral valve is normal in structure. Trivial mitral valve regurgitation. No evidence of mitral stenosis.  4. The aortic valve is tricuspid. Aortic valve  regurgitation is not visualized. No aortic stenosis is present. FINDINGS  Left Ventricle: Left ventricular ejection fraction, by estimation, is 60 to 65%. The left ventricle has normal function. The left ventricle has no regional wall motion abnormalities. The left ventricular internal cavity size was normal in size. There is  mild concentric left ventricular hypertrophy. Left ventricular diastolic parameters are consistent with Grade I diastolic dysfunction (impaired relaxation). Indeterminate filling pressures. Right Ventricle: The right ventricular size is normal. No increase in right ventricular wall thickness. Right ventricular systolic function is normal. Left [facility]: Left atrial size was normal in size. Right [facility]: Right atrial size was normal in size. Pericardium: There is no evidence of pericardial effusion. Mitral Valve: The mitral valve is normal in structure. Trivial mitral valve regurgitation. No evidence of mitral valve stenosis. Tricuspid Valve: The tricuspid valve is normal in structure. Tricuspid valve regurgitation is mild . No evidence of tricuspid stenosis. Aortic Valve: The aortic valve is tricuspid. Aortic valve regurgitation is not visualized. No aortic stenosis is present. Pulmonic Valve: The pulmonic valve was normal in structure. Pulmonic valve regurgitation is not visualized. No evidence of pulmonic stenosis. Aorta: The aortic root is normal in size and structure. Venous: The inferior vena cava was not well visualized. IAS/Shunts: No atrial level shunt detected by color flow Doppler. Additional Comments: A device lead is visualized.  LEFT VENTRICLE PLAX 2D LVIDd:         4.30 cm     Diastology LVIDs:         2.60 cm     LV e' medial:    7.40 cm/s LV PW:         1.50 cm     LV E/e' medial:  11.5 LV IVS:        1.20 cm     LV e' lateral:   9.03 cm/s LVOT diam:     2.00 cm     LV E/e' lateral: 9.4 LV SV:         78 LV SV Index:   39 LVOT Area:     3.14 cm  LV Volumes (MOD) LV vol d, MOD  A2C: 98.2 ml LV vol d, MOD A4C: 74.9 ml LV vol s, MOD A2C: 24.0 ml LV vol s, MOD A4C: 20.9 ml LV SV MOD A2C:     74.2 ml LV SV MOD A4C:     74.9 ml LV SV MOD BP:      61.3  ml RIGHT VENTRICLE RV S prime:     11.90 cm/s TAPSE (M-mode): 2.0 cm LEFT ATRIUM             Index       RIGHT ATRIUM           Index LA diam:        3.90 cm 1.95 cm/m  RA Area:     10.70 cm LA Vol (A2C):   31.2 ml 15.58 ml/m RA Volume:   17.80 ml  8.89 ml/m LA Vol (A4C):   37.2 ml 18.57 ml/m LA Biplane Vol: 34.3 ml 17.13 ml/m  AORTIC VALVE LVOT Vmax:   106.00 cm/s LVOT Vmean:  81.700 cm/s LVOT VTI:    0.249 m  AORTA Ao Root diam: 3.40 cm Ao Asc diam:  3.40 cm MITRAL VALVE               TRICUSPID VALVE MV Area (PHT): 2.99 cm    TR Peak grad:   13.0 mmHg MV Decel Time: 254 msec    TR Vmax:        180.00 cm/s MV E velocity: 85.30 cm/s MV A velocity: 98.10 cm/s  SHUNTS MV E/A ratio:  0.87        Systemic VTI:  0.25 m                            Systemic Diam: 2.00 cm Skeet Latch MD Electronically signed by Skeet Latch MD Signature Date/Time: 02/24/2021/5:26:05 PM    Final    US Abdomen Limited RUQ (LIVER/GB)  Result Date: 02/24/2021 CLINICAL DATA:  Cirrhosis EXAM: ULTRASOUND ABDOMEN LIMITED RIGHT UPPER QUADRANT COMPARISON:  01/07/2021 FINDINGS: Gallbladder: Not visualized. Common bile duct: Diameter: Not visualized. Liver: Limited visualization of the liver which appears diffusely echogenic. Portal vein not visualized. Other: None. IMPRESSION: Very limited ultrasound evaluation of the right upper quadrant. Nonvisualization of the gallbladder, common bile duct, and portal vein. Limited visualization of the liver which appears diffusely echogenic. Unable to reliably exclude hepatic lesions. Electronically Signed   By: Miachel Roux M.D.   On: 02/24/2021 11:16   Assessment / Plan: #55 year old Hispanic male with decompensated NASH/EtOH cirrhosis, and alcoholic hepatitis. He did receive a trial of steroids at initial diagnosis  but this was discontinued due to lack of response. He continues abstinence from alcohol since December. Most recent MELD was 25, but was unable to be calculated recently due to coumadin use.  Was supposed to have an appt with [facility] this month.  #2  Altered mental status with confusion and seizure-like activity: Ammonia level was elevated 234.  Patient ultimately intubated due to inability to protect airway.  Had recently been started on Xifaxan 550 mg twice daily and lactulose at home.  Started on lactulose here.  Ammonia 121 today.  #3  Hematemesis: No esophageal varices seen on recent EGD in February although there was some noted on previous cross-sectional imaging.  Hemoglobin currently stable at 12.4 g.  Reports of hematemesis in route to the ED, but only scant blood noted with insertion of NGT per his nurse and so far no output from below.  Reports of tongue lac.  No further sign of active bleeding.  #4  Ascites: Weight has been stable.  Last paracentesis was 2.6 L on March 12.  Negative for SBP.  He was on Lasix 40 mg daily and Aldactone 100 mg daily at home.  He has had  a diuresis of about 30 pounds overall.  No ascites noted on ultrasound yesterday.  #5 Supratherapeutic INR at 7.9 in the setting of Coumadin use due to recent DVT in March.  Now reversed down to 1.8.  #6 Was Covid+ 2 weeks ago.  Swab this admission is negative.  #7  Hyponatremia:  Na+ 132 this AM  -Resume diuretics when able. -Per his nurse, definite tongue laceration noted.  I think that we can stop the octreotide and and change PPI to IV BID.  Will discuss with my attending regarding need for further rocephin as well. -Continue lactulose 30 grams TID.  Could consider lactulose enema if needed.  Continue xifaxan 550 mg BID.    LOS: 1 day   Laban Emperor. Zehr  02/25/2021, 9:22 AM

## 2021-02-25 NOTE — TOC Initial Note (Signed)
Transition of Care [facility]) - Initial/Assessment Note    Patient Details  Name: Ian Hall MRN: [id number] Date of Birth: 12-12-1965  [facility] CM/SW Contact:    Leeroy Cha, RN Phone Number: 02/25/2021, 8:00 AM  Clinical Narrative:                 55 year old man who is chronically ill due to NASH/alcohol-related cirrhosis.  Has been abstinent since December 2021.  Also with a history of hyponatremia with admission earlier this month at which time he was found to have incidental COVID-19 as well as a lower extremity DVT for which he was placed on warfarin.  He is admitted now with acute progressive encephalopathy, seizure activity.  He was a bit more confused and lethargic over the last few days, wife reports especially in the afternoon/evening, question after he took his multiple medications.  She checked him in early morning hours 3/29, appeared to be sleeping.  Then a few hours later checked him again, he was in the same position, obtunded with snoring respirations, some rhythmic right lower extremity movement, blood from his mouth.  Brought to the emergency department where he was poorly responsive.  Hemoglobin 12.7, sodium 497, mild metabolic acidosis with a lactic acid 4.0 and significant coagulopathy INR 7.9.  PLAN: following for toc needs and to return to home with wife. Expected Discharge Plan: Home/Self Care Barriers to Discharge: Continued Medical Work up   Patient Goals and CMS Choice Patient states their goals for this hospitalization and ongoing recovery are:: on the vent unable to state      Expected Discharge Plan and Services Expected Discharge Plan: Home/Self Care   Discharge Planning Services: CM Consult   Living arrangements for the past 2 months: Single Family Home Expected Discharge Date:  (unknown)                                    Prior Living Arrangements/Services Living arrangements for the past 2 months: Single Family  Home Lives with:: Spouse Patient language and need for interpreter reviewed:: Yes        Need for Family Participation in Patient Care: Yes (Comment) Care giver support system in place?: Yes (comment)   Criminal Activity/Legal Involvement Pertinent to Current Situation/Hospitalization: No - Comment as needed  Activities of Daily Living Home Assistive Devices/Equipment: Eyeglasses ADL Screening (condition at time of admission) Patient's cognitive ability adequate to safely complete daily activities?: Yes Is the patient deaf or have difficulty hearing?: No Does the patient have difficulty seeing, even when wearing glasses/contacts?: No Does the patient have difficulty concentrating, remembering, or making decisions?: Yes Patient able to express need for assistance with ADLs?: No Does the patient have difficulty dressing or bathing?: Yes Independently performs ADLs?: No Communication: Independent Dressing (OT): Independent Grooming: Independent Feeding: Independent Bathing: Independent Toileting: Needs assistance Is this a change from baseline?: Pre-admission baseline In/Out Bed: Needs assistance Is this a change from baseline?: Pre-admission baseline Walks in Home: Needs assistance Is this a change from baseline?: Pre-admission baseline Does the patient have difficulty walking or climbing stairs?: Yes (secondary to weakness) Weakness of Legs: Both Weakness of Arms/Hands: None  Permission Sought/Granted                  Emotional Assessment Appearance:: Appears stated age Attitude/Demeanor/Rapport: Unable to Assess Affect (typically observed): Unable to Assess Orientation: : Fluctuating Orientation (Suspected and/or reported  [place]) Alcohol / Substance Use: Other (comment) Psych Involvement: No (comment)  Admission diagnosis:  Hepatic encephalopathy (HCC) [K72.90] GI bleed [K92.2] Encounter for orogastric (OG) tube placement [Z46.59] Patient Active Problem List    Diagnosis Date Noted  . GI bleed 02/24/2021  . Acute deep vein thrombosis (DVT) of right tibial vein ([place]) 02/12/2021  . Hyponatremia 02/06/2021  . Dyspnea on exertion   . Abdominal swelling, generalized   . Ascites due to alcoholic cirrhosis ([place])   . Jaundice   . 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 Thrombocytopenia ([place]) 01/07/2021  . Alcohol abuse, episodic 01/07/2021  . Alcoholic hepatitis 06/19/5749  . Cirrhosis of liver with ascites ([place])   . Hyperbilirubinemia   . Alcoholic hepatitis with ascites 12/26/2020  . Hepatitis, alcoholic, acute 51/83/3582  . 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

## 2021-02-26 ENCOUNTER — Inpatient Hospital Stay (HOSPITAL_COMMUNITY): Payer: No Typology Code available for payment source

## 2021-02-26 DIAGNOSIS — K7290 Hepatic failure, unspecified without coma: Secondary | ICD-10-CM | POA: Diagnosis not present

## 2021-02-26 LAB — GLUCOSE, CAPILLARY
Glucose-Capillary: 123 mg/dL — ABNORMAL HIGH (ref 70–99)
Glucose-Capillary: 131 mg/dL — ABNORMAL HIGH (ref 70–99)
Glucose-Capillary: 133 mg/dL — ABNORMAL HIGH (ref 70–99)
Glucose-Capillary: 135 mg/dL — ABNORMAL HIGH (ref 70–99)
Glucose-Capillary: 138 mg/dL — ABNORMAL HIGH (ref 70–99)
Glucose-Capillary: 140 mg/dL — ABNORMAL HIGH (ref 70–99)
Glucose-Capillary: 142 mg/dL — ABNORMAL HIGH (ref 70–99)

## 2021-02-26 LAB — COMPREHENSIVE METABOLIC PANEL
ALT: 46 U/L — ABNORMAL HIGH (ref 0–44)
AST: 114 U/L — ABNORMAL HIGH (ref 15–41)
Albumin: 2.4 g/dL — ABNORMAL LOW (ref 3.5–5.0)
Alkaline Phosphatase: 289 U/L — ABNORMAL HIGH (ref 38–126)
Anion gap: 8 (ref 5–15)
BUN: 30 mg/dL — ABNORMAL HIGH (ref 6–20)
CO2: 21 mmol/L — ABNORMAL LOW (ref 22–32)
Calcium: 8.2 mg/dL — ABNORMAL LOW (ref 8.9–10.3)
Chloride: 103 mmol/L (ref 98–111)
Creatinine, Ser: 1.05 mg/dL (ref 0.61–1.24)
GFR, Estimated: >60 mL/min (ref >60)
Glucose, Bld: 151 mg/dL — ABNORMAL HIGH (ref 70–99)
Potassium: 4.7 mmol/L (ref 3.5–5.1)
Sodium: 132 mmol/L — ABNORMAL LOW (ref 135–145)
Total Bilirubin: 8.4 mg/dL — ABNORMAL HIGH (ref 0.3–1.2)
Total Protein: 5.5 g/dL — ABNORMAL LOW (ref 6.5–8.1)

## 2021-02-26 LAB — PROTIME-INR
INR: 1.6 — ABNORMAL HIGH (ref 0.8–1.2)
Prothrombin Time: 18.7 seconds — ABNORMAL HIGH (ref 11.4–15.2)

## 2021-02-26 LAB — CBC
HCT: 35.6 % — ABNORMAL LOW (ref 39.0–52.0)
Hemoglobin: 11.8 g/dL — ABNORMAL LOW (ref 13.0–17.0)
MCH: 34.1 pg — ABNORMAL HIGH (ref 26.0–34.0)
MCHC: 33.1 g/dL (ref 30.0–36.0)
MCV: 102.9 fL — ABNORMAL HIGH (ref 80.0–100.0)
Platelets: 72 10*3/uL — ABNORMAL LOW (ref 150–400)
RBC: 3.46 MIL/uL — ABNORMAL LOW (ref 4.22–5.81)
RDW: 15.7 % — ABNORMAL HIGH (ref 11.5–15.5)
WBC: 8.8 10*3/uL (ref 4.0–10.5)
nRBC: 0 % (ref 0.0–0.2)

## 2021-02-26 LAB — AMMONIA: Ammonia: 79 umol/L — ABNORMAL HIGH (ref 9–35)

## 2021-02-26 LAB — PHOSPHORUS
Phosphorus: 4 mg/dL (ref 2.5–4.6)
Phosphorus: 3.3 mg/dL (ref 2.5–4.6)

## 2021-02-26 LAB — MAGNESIUM
Magnesium: 1.9 mg/dL (ref 1.7–2.4)
Magnesium: 1.9 mg/dL (ref 1.7–2.4)

## 2021-02-26 LAB — MRSA PCR SCREENING: MRSA by PCR: NEGATIVE

## 2021-02-26 MED ORDER — CHLORHEXIDINE GLUCONATE 0.12% ORAL RINSE (MEDLINE KIT)
15.0000 mL | Freq: Two times a day (BID) | OROMUCOSAL | Status: DC
  Administered 2021-02-26 – 2021-02-27 (×2): 15 mL via OROMUCOSAL

## 2021-02-26 MED ORDER — ORAL CARE MOUTH RINSE
15.0000 mL | OROMUCOSAL | Status: DC
  Administered 2021-02-27 (×2): 15 mL via OROMUCOSAL

## 2021-02-26 NOTE — Procedures (Signed)
Extubation Procedure Note  Patient Details:   Name: Ian Hall DOB: [birth year] MRN: [id number]   Airway Documentation:  Airway 7.5 mm (Active)  Secured at (cm) 24 cm 02/26/21 0824  Measured From Lips 02/26/21 0824  Secured Location Right 02/26/21 0824  Secured By [employer] 02/26/21 0824  Tube Holder Repositioned Yes 02/26/21 0824  Prone position No 02/26/21 0322  Site Condition Cool;Dry 02/25/21 2000   Vent end date: 02/26/21 Vent end time: 0900   Evaluation  O2 sats: stable throughout Complications: No apparent complications Patient did tolerate procedure well. Bilateral Breath Sounds: Clear,Diminished   Yes  Elsie Stain 02/26/2021, 9:55 AM

## 2021-02-26 NOTE — Progress Notes (Signed)
Pt weaned starting with 12/5 then 10/5 and then we were able to go to 8/5 and the Pt was more awake. Pt followed simple directions but still a little sleepy. Leak test was good . RT extubated the Pt per doctors orders. The Pt looked good and spoke after extubation. RT placed the Pt on 3l [place].

## 2021-02-26 NOTE — Progress Notes (Signed)
Spent approximately 45 minutes with wife confirming & reviewing details surrounding admission. She indicates he has not had ETOH consumption since 10/2020 (there were concerns in prior documentation that he drank in 12/2020 but she states he has not).  When asked about details surrounding admission. She reports he told her he felt tired around 7pm on night prior to presentation. She encouraged him to go to bed. She checked on him at 0300 and he was asleep across the bed.  At 0400 she checked on him again and noted he had not moved. She found him face down with blood coming out of his mouth.  They called 911. She reports she has a Management consultant but is uncomfortable driving to [place], [place]. This has been a barrier to seeing the transplant team at [facility].  She notes that she never saw seizure like activity / abnormal motor movements prior to presentation (there has been concern that he had seizure like activity).     Support offered, will discuss with care management to see if there are any transport options for patient.    Noe Gens, MSN, APRN, NP-C, AGACNP-BC [facility] 02/26/2021, 1:59 PM   Please see [email or web address] for pager details.   From 7A-7P if no response, please call [phone] After hours, please call ELink [phone]

## 2021-02-26 NOTE — Evaluation (Signed)
SLP Cancellation Note  Patient Details Name: JEFFRE ENRIQUES MRN: [id number] DOB: 07-05-1966   Cancelled treatment:       Reason Eval/Treat Not Completed: Other (comment) (pt not fully alert, needing bedpan, assisted RN to get pt on bedpan and will return, pt wants water - advised will return for swallow eval when he is available)  Kathleen Lime, MS [place] [facility] [phone] Pager [phone]   Macario Golds 02/26/2021, 1:34 PM

## 2021-02-26 NOTE — Evaluation (Signed)
Clinical/Bedside Swallow Evaluation Patient Details  Name: Ian Hall MRN: [id number] Date of Birth: 1965/12/03  Today's Date: 02/26/2021 Time: SLP Start Time (ACUTE ONLY): 7829 SLP Stop Time (ACUTE ONLY): 1601 SLP Time Calculation (min) (ACUTE ONLY): 46 min  Past Medical History:  Past Medical History:  Diagnosis Date  . Anxiety   . Cirrhosis ([place])   . COVID-19    Past Surgical History:  Past Surgical History:  Procedure Laterality Date  . ANKLE SURGERY Right   . IR PARACENTESIS  01/07/2021  . IR PARACENTESIS  01/30/2021  . IR TRANSCATHETER BX  01/09/2021  . IR US GUIDE VASC ACCESS RIGHT  01/09/2021  . 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  He had supposedly been abstinent since December 2021 but there are some notes that question whether he had relapsed in January per NP notes.   Questionable seizure per notes, CT brain negative.  Currently pt is on a PPI.  He has an NG in place for tube feeding.  Also ? Tongue laceration? per notes.    CXR 3/30 Low lung volumes with bibasilar atelectasis.  Low lung volumes with bibasilar atelectasis. Left base infiltrate cannot be excluded. Tiny bilateral pleural effusions can not be excluded.  Pt was extubated on 02/26/2021 at 1000.  Swallow evaluation ordered.  Pt underwent endoscopy 01/09/2021 showing "Diffuse mild mucosal changes characterized by discoloration were found in the entire  esophagus. Biopsies were taken with a cold forceps for histology. Estimated blood loss was  minimal. No esophageal varices seen.   Assessment / Plan / Recommendation Clinical Impression  Pt's largest barrier to po consumption today is his mentation.  SlP proceeded with evaluation due to his chronic requests for water and ice.  He was oriented to person, place and situation.  Admits voice is weak and appears with ulceration on lateral tongue =  right with minimal pooled secretions.  Provideded oral care using oral suction/toothebrush.  Pt was able to follow directions during OME, verbalize number of fingers SlP held up as he was not looking directly as SLP.  He benefited from frequent stimulation to stay alert.  Provided pt with single ice chips via tsp - Pt readily opened his mouth to accept single ice chips.  He demonstrated adequate mastication of ice with marginal delayed swallow.  After fourth ice bolus, pt presented with multiple swallows and throat clearing.  He admitted to sensation of retention in pharynx thus he was instructed to clear throat strongly to clear and re-swallow. Certainly NG tube may negaively impact epiglottic deflection/airway protection but do not recommend remove due to pt's mentation.  Recommend single ice chips only and SLP follow up 4/1 to help assess determination for readiness for po diet.  Used interpreter Mollie Germany [phone] during session.  Anticipate pt's dysphagia will resolved quickly as mentation improves. SLP Visit Diagnosis: Dysphagia, unspecified (R13.10)    Aspiration Risk    severe   Diet Recommendation NPO;Ice chips PRN after oral care   Medication Administration: Via alternative means Postural Changes: Seated upright at 90 degrees;Remain upright for at least 30 minutes after po intake    Other  Recommendations Oral Care Recommendations: Oral care BID Other Recommendations: Have oral suction available   Follow up Recommendations        Frequency and Duration     n/a  Prognosis   good for goals     Swallow Study   General Date of Onset: 02/26/21 HPI: Pt is a 55 yo admit to [facility] with 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  He had supposedly been abstinent since December 2021 but there are some notes that question whether  he had relapsed in January per NP notes.   Questionable seizure per notes, CT brain negative.  Currently pt is on a PPI.  He has an NG in place for tube feeding.  Also ? Tongue laceration? per notes.    CXR 3/30 Low lung volumes with bibasilar atelectasis.  Low lung volumes with bibasilar atelectasis. Left base infiltrate cannot be excluded. Tiny bilateral pleural effusions can not be excluded.  Pt was extubated on 02/26/2021 at 1000.  Swallow evaluation ordered.  Pt underwent endoscopy 01/09/2021 showing "Diffuse mild mucosal changes characterized by discoloration were found in the entire  esophagus. Biopsies were taken with a cold forceps for histology. Estimated blood loss was  minimal. No esophageal varices seen. Type of Study: Bedside Swallow Evaluation Diet Prior to this Study: NPO Temperature Spikes Noted: No Respiratory Status: Nasal cannula History of Recent Intubation: Yes Length of Intubations (days): 2 days Date extubated: 02/26/21 Behavior/Cognition: Alert;Cooperative;Pleasant mood Oral Cavity Assessment: Excessive secretions Oral Care Completed by SLP: Yes Oral Cavity - Dentition: Adequate natural dentition Vision: Impaired for self-feeding Self-Feeding Abilities: Total assist Patient Positioning: Upright in bed Baseline Vocal Quality: Hoarse Volitional Cough: Strong Volitional Swallow: Able to elicit    Oral/Motor/Sensory Function Overall Oral Motor/Sensory Function: Generalized oral weakness   Ice Chips Ice chips: Impaired Presentation: Spoon Pharyngeal Phase Impairments: Suspected delayed Swallow;Throat Clearing - Delayed   Thin Liquid Thin Liquid: Not tested    Nectar Thick Nectar Thick Liquid: Not tested   Honey Thick Honey Thick Liquid: Not tested   Puree Puree: Not tested   Solid     Solid: Not tested      Macario Golds 02/26/2021,4:19 PM   Kathleen Lime, MS [place] Office [phone] Pager [phone]

## 2021-02-26 NOTE — Progress Notes (Signed)
We are following the patient intermittently.  I walked by his room and saw that he was extubated.  His nurse says that he was just extubated this morning.  Speech is going to see and evaluate him for PO intake.  If he passes then he is fine for a diet from a GI standpoint.  Otherwise continue as per our recommendations yesterday, 3/30, and we will see him again formally tomorrow, 4/1.

## 2021-02-26 NOTE — Progress Notes (Addendum)
NAME:  Ian Hall, MRN:  [id number], DOB:  Dec 27, 1965, LOS: 2 ADMISSION DATE:  02/24/2021, CONSULTATION DATE: 02/24/2021 REFERRING MD:    Dr. Sedonia Small, CHIEF COMPLAINT: Seizures with hematemesis  History of Present Illness:  55 year old male who presented to [facility] on 3/29 via EMS with AMS and possible seizure like activity.   He has a PMH of decompensated cirrhosis, alcohol abuse, incidental COVID (positive 02/06/21), lower extremity DVT anticoagulated with Coumadin and anxiety.  Wife reported on admit she saw him early in the am with decreased mentation, possible seizure activity, and bleeding from his mouth.  Per EMS patient remained largely unresponsive and had episode of hematemesis in route to ED. He had snoring respirations on presentation and abnormal motor movements of the RLE concerning for seizure. He was intubated for airway protection.  Pertinent  Medical History  Decompensated cirrhosis ETOH Abuse  COVID Lower extremity DVT anticoagulated with Coumadin Anxiety  Significant Hospital Events: Including procedures, antibiotic start and stop dates in addition to other pertinent events   . 3/29 admitted with altered mental status and questionable seizures with hematemesis, intubated for airway protection in ED . 3/30 Remains intubated, on propofol / fentanyl. Increased lactulose. Octreotide stopped.  . 3/31 SBT / WUA, following commands  Interim History / Subjective:  Sedation turned down for WUA  Afebrile Finishing up 1L bicarbonate gtt No acute events overnight  I/O 1.7L, +763m in last 24 hours   Objective   Blood pressure (!) 159/71, pulse 81, temperature (!) 97.5 F (36.4 C), temperature source Axillary, resp. rate 16, height 5' 5"  (1.651 m), weight 93.6 kg, SpO2 98 %.    Vent Mode: PSV;CPAP FiO2 (%):  [30 %-40 %] 30 % Set Rate:  [16 bmp] 16 bmp Vt Set:  [490 mL] 490 mL PEEP:  [5 cmH20] 5 cmH20 Pressure Support:  [5 cmH20-8 cmH20] 8 cmH20 Plateau Pressure:  [11 cmH20-20  cmH20] 18 cmH20   Intake/Output Summary (Last 24 hours) at 02/26/2021 0849 Last data filed at 02/26/2021 0600 Gross per 24 hour  Intake 2273.87 ml  Output 1780 ml  Net 493.87 ml   Filed Weights   02/24/21 0537 02/25/21 0446  Weight: 93.4 kg 93.6 kg    Examination: General: chronically ill appearing adult male lying in bed on vent in NAD  HEENT: MM pink/moist, ETT, scleral icterus, NGT, pupils 337mreactive  Neuro: drowsy, opens eyes to voice, MAE, follows simple commands CV: s1s2 RRR, SR in 90-100's, no m/r/g PULM: non-labored at rest GI: soft, bsx4 hypoactive active  Extremities: warm/dry, no edema, SCD's in place  Skin: no rashes or lesions  CXR 3/31 - images personally reviewed, low lung volumes, ETT in good position, tiny left effusion       Resolved Hospital Problem list     Assessment & Plan:   Acute Respiratory Insufficiency in setting of Metabolic Encephalopathy  Secondary to metabolic encephalopathy and possible seizure-like activity  -SBT / WUA with goal for extubation  -follow intermittent CXR -reduce sedation for extubation  -pulmonary hygiene as able  -HOB >30 degrees  Decompensated NASH/EtOH Cirrhosis Alcoholic hepatitis MELD score 25, failed trial of steroids at time of diagnosis. Home meds include Lasix and Aldactone.  Reportedly stopped drinking in 10/2020 but there is documentation that he was drinking in 11/2020 per EDP.  Recently underwent upper and lower endoscopies with GI 12/30/2020 no gross abnormalities or varices identified. Has been referred to [facility].  -appreciate GI evaluation > saw 3/30, will follow peripherally  -  stop rocephin after 3/31 dosing -trend LFT's  -continue rifaxamin  -follow ammonia trend  -acute hepatitis panel negative   Acute Hepatic Encephalopathy  Seizures Ammonia on Admission 234 > 121 on 3/30. Wife reports seizure activity prior to admission patient was also seen with rhythmic right lower extremity leg tension.  CT head negative. EEG with diffuse slowing of the background activity. Some hx in chart to support prior seizure, suspect these may be related to possible ETOH consumption > ?hx of starting to drink again in 11/2020?.   -follow ammonia trend  -continue keppra > will need to determine duration.  If ETOH / decompensated cirrhosis related, would not continue -seizure precautions  -delirium prevention measures  Upper GI bleed vs Tongue Laceration  Coagulopathy Elevated INR Anemia  Thrombocytopenia Presented with bright red blood oral cavity and episode of hematemesis with EMS.  INR 7.9 on admission on home Coumadin.  Also noted to have tongue laceration. Received vitamin K and KCentra -appreciate GI assistance, no clear evidence of GIB > suspect may have been related to tongue laceration with seizure  -continue SCD's  -hold home warfarin > was on for DVT dx 01/2021. Consider repeat doppler ? -monitor for further bleeding  -hold TF for extubation   NGMA  -complete sodium bicarbonate infusion, then follow   Hyponatremia Hypomagnesemia -Trend BMP / urinary output -Replace electrolytes as indicated -Avoid nephrotoxic agents, ensure adequate renal perfusion  Lactic acidosis -cleared / resolved    Diagnosis lower extremity DVT anticoagulated with Coumadin at baseline -hold anticoagulation in setting of bleeding   Best practice   Diet:  NPO Pain/Anxiety/Delirium protocol (if indicated): Yes (RASS goal -1) VAP protocol (if indicated): Yes DVT prophylaxis: Contraindicated GI prophylaxis: PPI Glucose control:  SSI No Central venous access:  N/A Arterial line:  N/A Foley:  N/A Mobility:  bed rest  PT consulted: N/A Last date of multidisciplinary goals of care discussion:  Wife updated with translator on 3/30. Will update on arrival 3/31 with translator on plan of care.  Code Status:  full code Disposition: ICU  Labs   CBC: Recent Labs  Lab 02/24/21 1149 02/24/21 1727  02/25/21 0032 02/25/21 0726 02/26/21 0216  WBC 6.5 9.2 10.1 11.6* 8.8  HGB 11.7* 12.0* 12.4* 12.5* 11.8*  HCT 33.8* 35.0* 36.1* 37.5* 35.6*  MCV 100.0 100.3* 99.7 102.7* 102.9*  PLT 63* 69* 74* 84* 72*    Basic Metabolic Panel: Recent Labs  Lab 02/24/21 0548 02/24/21 0607 02/25/21 0032 02/25/21 1109 02/25/21 1651 02/26/21 0216  NA 130* 132* 132*  --   --  132*  K 4.9 5.0 5.1  --   --  4.7  CL 104 103 107  --   --  103  CO2 17*  --  17*  --   --  21*  GLUCOSE 152* 147* 116*  --   --  151*  BUN 25* 24* 31*  --   --  30*  CREATININE 1.11 0.90 1.25*  --   --  1.05  CALCIUM 8.7*  --  8.5*  --   --  8.2*  MG  --   --  1.7 1.9 1.9 1.9  PHOS  --   --  4.6 4.4 4.5 4.0   GFR: Estimated Creatinine Clearance: 84.5 mL/min (by C-G formula based on SCr of 1.05 mg/dL). Recent Labs  Lab 02/24/21 0548 02/24/21 1149 02/24/21 1727 02/25/21 0032 02/25/21 0726 02/25/21 1109 02/26/21 0216  WBC 7.2 6.5 9.2 10.1 11.6*  --  8.8  LATICACIDVEN 4.0* 2.0*  --   --   --  1.3  --     Liver Function Tests: Recent Labs  Lab 02/24/21 0548 02/26/21 0216  AST 139* 114*  ALT 53* 46*  ALKPHOS 349* 289*  BILITOT 9.0* 8.4*  PROT 6.6 5.5*  ALBUMIN 2.9* 2.4*   No results for input(s): LIPASE, AMYLASE in the last 168 hours. Recent Labs  Lab 02/24/21 0548 02/24/21 1149 02/25/21 0032  AMMONIA 234* 138* 121*    ABG    Component Value Date/Time   PHART 7.299 (L) 02/25/2021 0515   PCO2ART 37.8 02/25/2021 0515   PO2ART 107 02/25/2021 0515   HCO3 18.0 (L) 02/25/2021 0515   TCO2 19 (L) 02/24/2021 0607   ACIDBASEDEF 7.4 (H) 02/25/2021 0515   O2SAT 97.1 02/25/2021 0515     Coagulation Profile: Recent Labs  Lab 02/24/21 0548 02/24/21 1017 02/25/21 0032 02/26/21 0216  INR 7.9* 1.8* 1.8* 1.6*    Cardiac Enzymes: No results for input(s): CKTOTAL, CKMB, CKMBINDEX, TROPONINI in the last 168 hours.  HbA1C: Hemoglobin A1C  Date/Time Value Ref Range Status  01/17/2017 10:41 AM 5.9   Final  01/01/2016 11:26 AM 6.1  Final   Hgb A1c MFr Bld  Date/Time Value Ref Range Status  02/04/2021 03:57 PM 4.3 (L) 4.8 - 5.6 % Final    Comment:             Prediabetes: 5.7 - 6.4          Diabetes: >6.4          Glycemic control for adults with diabetes: <7.0     CBG: Recent Labs  Lab 02/25/21 1644 02/25/21 1934 02/25/21 2336 02/26/21 0322 02/26/21 0757  GLUCAP 119* 114* 135* 133* 131*      Critical care time: 34 minutes    Noe Gens, MSN, APRN, NP-C, AGACNP-BC [facility] 02/26/2021, 8:49 AM   Please see [email or web address] for pager details.   From 7A-7P if no response, please call [phone] After hours, please call ELink [phone]

## 2021-02-27 ENCOUNTER — Ambulatory Visit: Payer: No Typology Code available for payment source | Admitting: Physical Therapy

## 2021-02-27 ENCOUNTER — Inpatient Hospital Stay (HOSPITAL_COMMUNITY): Payer: No Typology Code available for payment source

## 2021-02-27 DIAGNOSIS — K7030 Alcoholic cirrhosis of liver without ascites: Secondary | ICD-10-CM | POA: Diagnosis present

## 2021-02-27 DIAGNOSIS — Z86718 Personal history of other venous thrombosis and embolism: Secondary | ICD-10-CM

## 2021-02-27 DIAGNOSIS — K922 Gastrointestinal hemorrhage, unspecified: Secondary | ICD-10-CM | POA: Diagnosis not present

## 2021-02-27 DIAGNOSIS — K7290 Hepatic failure, unspecified without coma: Secondary | ICD-10-CM | POA: Diagnosis not present

## 2021-02-27 LAB — CBC
HCT: 36 % — ABNORMAL LOW (ref 39.0–52.0)
Hemoglobin: 12.1 g/dL — ABNORMAL LOW (ref 13.0–17.0)
MCH: 34.6 pg — ABNORMAL HIGH (ref 26.0–34.0)
MCHC: 33.6 g/dL (ref 30.0–36.0)
MCV: 102.9 fL — ABNORMAL HIGH (ref 80.0–100.0)
Platelets: 65 10*3/uL — ABNORMAL LOW (ref 150–400)
RBC: 3.5 MIL/uL — ABNORMAL LOW (ref 4.22–5.81)
RDW: 15.4 % (ref 11.5–15.5)
WBC: 10.1 10*3/uL (ref 4.0–10.5)
nRBC: 0 % (ref 0.0–0.2)

## 2021-02-27 LAB — BASIC METABOLIC PANEL
Anion gap: 7 (ref 5–15)
BUN: 27 mg/dL — ABNORMAL HIGH (ref 6–20)
CO2: 25 mmol/L (ref 22–32)
Calcium: 8.4 mg/dL — ABNORMAL LOW (ref 8.9–10.3)
Chloride: 103 mmol/L (ref 98–111)
Creatinine, Ser: 0.71 mg/dL (ref 0.61–1.24)
GFR, Estimated: >60 mL/min (ref >60)
Glucose, Bld: 137 mg/dL — ABNORMAL HIGH (ref 70–99)
Potassium: 4.3 mmol/L (ref 3.5–5.1)
Sodium: 135 mmol/L (ref 135–145)

## 2021-02-27 LAB — GLUCOSE, CAPILLARY
Glucose-Capillary: 100 mg/dL — ABNORMAL HIGH (ref 70–99)
Glucose-Capillary: 114 mg/dL — ABNORMAL HIGH (ref 70–99)
Glucose-Capillary: 127 mg/dL — ABNORMAL HIGH (ref 70–99)
Glucose-Capillary: 129 mg/dL — ABNORMAL HIGH (ref 70–99)
Glucose-Capillary: 133 mg/dL — ABNORMAL HIGH (ref 70–99)
Glucose-Capillary: 143 mg/dL — ABNORMAL HIGH (ref 70–99)

## 2021-02-27 MED ORDER — CEFTRIAXONE SODIUM 2 G IJ SOLR
2.0000 g | Freq: Every day | INTRAMUSCULAR | Status: DC
  Filled 2021-02-27: qty 20

## 2021-02-27 MED ORDER — ORAL CARE MOUTH RINSE
15.0000 mL | Freq: Two times a day (BID) | OROMUCOSAL | Status: DC
  Administered 2021-02-27 – 2021-03-01 (×3): 15 mL via OROMUCOSAL

## 2021-02-27 MED ORDER — DOCUSATE SODIUM 100 MG PO CAPS: 100.0000 mg | ORAL_CAPSULE | Freq: Two times a day (BID) | ORAL | Status: DC

## 2021-02-27 MED ORDER — POLYETHYLENE GLYCOL 3350 17 G PO PACK: 17.0000 g | PACK | Freq: Every day | ORAL | Status: DC

## 2021-02-27 MED ORDER — SODIUM CHLORIDE 0.9 % IV SOLN
2.0000 g | INTRAVENOUS | Status: AC
  Administered 2021-02-27 – 2021-02-28 (×2): 2 g via INTRAVENOUS
  Filled 2021-02-27: qty 2
  Filled 2021-02-27: qty 20

## 2021-02-27 MED ORDER — RIFAXIMIN 550 MG PO TABS
550.0000 mg | ORAL_TABLET | Freq: Two times a day (BID) | ORAL | Status: DC
  Administered 2021-02-27 – 2021-03-01 (×4): 550 mg via ORAL
  Filled 2021-02-27 (×4): qty 1

## 2021-02-27 MED ORDER — LACTULOSE 10 GM/15ML PO SOLN
30.0000 g | Freq: Three times a day (TID) | ORAL | Status: DC
  Administered 2021-02-27 – 2021-03-01 (×5): 30 g via ORAL
  Filled 2021-02-27 (×5): qty 45

## 2021-02-27 NOTE — Evaluation (Signed)
Physical Therapy Evaluation Patient Details Name: Ian Hall MRN: [id number] DOB: [birth year] Today's Date: 02/27/2021   History of Present Illness  55 year old man with NASH/ethanol related cirrhosis. Admitted now with encephalopathy, possible seizure activity and acute respiratory failure.  He had bleeding from the mouth that appears to be from a tongue laceration.  Reportedly has not had alcohol since December 2021 but other notes indicate that he may have relapsed in January.  Recent hospitalization 3/11-3/17/22with hyponatremia, had apparent seizure activity at that time.  Also diagnosed with COVID-19 (incidental) and a lower extremity DVT.  He was discharged on warfarin.  Clinical Impression  Pt admitted with above diagnosis. Pt ambulated 15' with handheld assist of 2 to bathroom, pt had loss of balance posteriorly requiring min/mod assist of 2 to stabilize. At baseline, he is independent with mobility. Supervision for mobility recommended. RW recommended for home to minimize fall risk.   Pt currently with functional limitations due to the deficits listed below (see PT Problem List). Pt will benefit from skilled PT to increase their independence and safety with mobility to allow discharge to the venue listed below.       Follow Up Recommendations Home health PT; supervision for mobility    Equipment Recommendations  Rolling walker with 5" wheels    Recommendations for Other Services       Precautions / Restrictions Precautions Precautions: Fall Precaution Comments: denies h/o falls Restrictions Weight Bearing Restrictions: No      Mobility  Bed Mobility Overal bed mobility: Modified Independent             General bed mobility comments: used bedrail, HOB up 30*    Transfers Overall transfer level: Needs assistance Equipment used: 2 person hand held assist Transfers: Sit to/from Stand Sit to Stand: Min assist         General transfer comment: min A to power  up  Ambulation/Gait Ambulation/Gait assistance: Min assist Gait Distance (Feet): 15 Feet Assistive device: 2 person hand held assist Gait Pattern/deviations: Step-through pattern;Decreased step length - right;Decreased step length - left Gait velocity: decr   General Gait Details: very short step length, loss of balance posteriorly requiring mod A of 2 to correct, pt denied dizziness with LOB  Stairs            Wheelchair Mobility    Modified Rankin (Stroke Patients Only)       Balance Overall balance assessment: Needs assistance   Sitting balance-Leahy Scale: Fair     Standing balance support: Bilateral upper extremity supported Standing balance-Leahy Scale: Poor Standing balance comment: LOB posteriorly while walking                             Pertinent Vitals/Pain Pain Assessment: No/denies pain    Home Living Family/patient expects to be discharged to:: Private residence Living Arrangements: Spouse/significant other;Other relatives Available Help at Discharge: Family;Available 24 hours/day   Home Access: Stairs to enter   Entrance Stairs-Number of Steps: 2 Home Layout: One level Home Equipment: Grab bars - tub/shower;Shower seat      Prior Function Level of Independence: Independent         Comments: denies h/o falls in past 6 months     Hand Dominance        Extremity/Trunk Assessment   Upper Extremity Assessment Upper Extremity Assessment: Defer to OT evaluation    Lower Extremity Assessment Lower Extremity Assessment: Overall WFL for tasks assessed  Cervical / Trunk Assessment Cervical / Trunk Assessment: Normal  Communication   Communication: No difficulties  Cognition Arousal/Alertness: Awake/alert Behavior During Therapy: WFL for tasks assessed/performed Overall Cognitive Status: Within Functional Limits for tasks assessed                                        General Comments       Exercises     Assessment/Plan    PT Assessment Patient needs continued PT services  PT Problem List Decreased activity tolerance;Decreased balance;Decreased mobility;Decreased knowledge of use of DME       PT Treatment Interventions Gait training;Therapeutic exercise;Balance training;Therapeutic activities;Functional mobility training    PT Goals (Current goals can be found in the Care Plan section)  Acute Rehab PT Goals Patient Stated Goal: to be steady walking PT Goal Formulation: With patient Time For Goal Achievement: 03/13/21 Potential to Achieve Goals: Good    Frequency Min 3X/week   Barriers to discharge        Co-evaluation PT/OT/SLP Co-Evaluation/Treatment: Yes Reason for Co-Treatment: For patient/therapist safety PT goals addressed during session: Mobility/safety with mobility;Balance         AM-PAC PT "6 Clicks" Mobility  Outcome Measure Help needed turning from your back to your side while in a flat bed without using bedrails?: A Little Help needed moving from lying on your back to sitting on the side of a flat bed without using bedrails?: A Little Help needed moving to and from a bed to a chair (including a wheelchair)?: A Little Help needed standing up from a chair using your arms (e.g., wheelchair or bedside chair)?: A Little Help needed to walk in hospital room?: A Lot Help needed climbing 3-5 steps with a railing? : A Lot 6 Click Score: 16    End of Session   Activity Tolerance: Patient tolerated treatment well Patient left: in chair;with call bell/phone within reach;with chair alarm set Nurse Communication: Mobility status PT Visit Diagnosis: Unsteadiness on feet (R26.81);Difficulty in walking, not elsewhere classified (R26.2)    Time: 1470-9295 PT Time Calculation (min) (ACUTE ONLY): 19 min   Charges:   PT Evaluation $PT Eval Low Complexity: 1 Low         Blondell Reveal Kistler PT 02/27/2021  Acute Rehabilitation Services Pager  [phone] Office [phone]

## 2021-02-27 NOTE — Progress Notes (Addendum)
NAME:  Ian Hall, MRN:  [id number], DOB:  1966-09-13, LOS: 3 ADMISSION DATE:  02/24/2021, CONSULTATION DATE: 02/24/2021 REFERRING MD:    Dr. Sedonia Small, CHIEF COMPLAINT: Seizures with hematemesis  History of Present Illness:  55 year old male who presented to [facility] on 3/29 via EMS with AMS and possible seizure like activity.   He has a PMH of decompensated cirrhosis, alcohol abuse, incidental COVID (positive 02/06/21), lower extremity DVT anticoagulated with Coumadin and anxiety.  Wife reported on admit she saw him early in the am with decreased mentation, possible seizure activity, and bleeding from his mouth.  Per EMS patient remained largely unresponsive and had episode of hematemesis in route to ED. He had snoring respirations on presentation and abnormal motor movements of the RLE concerning for seizure. He was intubated for airway protection. Later found to have had a tongue laceration. No clear evidence of seizures.  Wife reports altered on bed with blood around his mouth. No clear evidence of GIB this admit.    Pertinent  Medical History  Decompensated cirrhosis ETOH Abuse  COVID Lower extremity DVT anticoagulated with Coumadin Anxiety  Significant Hospital Events: Including procedures, antibiotic start and stop dates in addition to other pertinent events   . 3/29 admitted with altered mental status and questionable seizures with hematemesis, intubated for airway protection in ED . 3/30 Remains intubated, on propofol / fentanyl. Increased lactulose. Octreotide stopped.  . 3/31 SBT / WUA, following commands. Extubated.  . 4/01 Mental status significantly improved. SLP evaluation pending.   Interim History / Subjective:  Afebrile  Pt awake, mental status improved, asking for catheter to be removed, hoping to eat. Thankful for care.  I/O 1.3L UOP, -631 in last 24 hours  Objective   Blood pressure 118/63, pulse 82, temperature 98 F (36.7 C), temperature source Oral, resp. rate 11,  height 5' 5"  (1.651 m), weight 99.9 kg, SpO2 100 %.    Vent Mode: PSV;CPAP FiO2 (%):  [30 %] 30 % PEEP:  [5 cmH20] 5 cmH20 Pressure Support:  [8 cmH20] 8 cmH20   Intake/Output Summary (Last 24 hours) at 02/27/2021 0809 Last data filed at 02/27/2021 2683 Gross per 24 hour  Intake 611.07 ml  Output 1300 ml  Net -688.93 ml   Filed Weights   02/24/21 0537 02/25/21 0446 02/27/21 0500  Weight: 93.4 kg 93.6 kg 99.9 kg    Examination: General: chronically ill appearing adult male lying in bed in NAD  HEENT: MM pink/moist, [place] O2, NGT in place Neuro: Awake, alert, oriented to self / place / situation. Does not remember being admitted to the hospital. MAE, speech clear.  CV: s1s2 RRR, no m/r/g PULM: non-labored on [place] O2, lungs bilaterally clear GI: soft, bsx4 active  Extremities: warm/dry, trace dependent edema, SCD's in place  Skin: no rashes or lesions       Resolved Hospital Problem list   NGMA Lactic Acidosis  Assessment & Plan:   Acute Respiratory Insufficiency in setting of Metabolic Encephalopathy  Secondary to metabolic encephalopathy and possible seizure-like activity  -pulmonary hygiene -mobilize, IS  -wean O2 to off for saturations >90% -minimize all sedating medications  Decompensated NASH/EtOH Cirrhosis Alcoholic hepatitis MELD score 25, failed trial of steroids at time of diagnosis. Home meds include Lasix and Aldactone.  Stopped drinking in 10/2020, confirmed with wife 3/31 no ETOH intake.  Recently underwent upper and lower endoscopies with GI 12/30/2020 no gross abnormalities or varices identified. Has been referred to [facility] but unable to make it  due to transportation issues. Acute hepatitis panel negative.  -appreciate GI input > suspect possible mucosal oozing given supratherapeutic INR in setting of portal hypertensive gastritis vs tongue laceration. No evidence of further bleeding since admit.  -plan for 5 days total rocephin, stop date in place -continue  rifaxamin, lactulose  -follow ammonia trend intermittently  -hold home aldactone, lasix > consider restarting once able to take PO's  Acute Hepatic Encephalopathy  Possible Seizure Ammonia on admission 234. CT head negative. EEG with diffuse slowing of the background activity (on sedation). Some hx in chart to support prior seizure but wife reports he was altered but no shaking activity noted with translator.  -supportive care -PT consult -continue keppra for 7 days, then stop and monitor for evidence of seizure activity. Wife's account of what occurred before admit does not support active seizure.  -seizure precautions -delirium prevention measures  Upper GI bleed vs Tongue Laceration  Coagulopathy Elevated INR Anemia  Thrombocytopenia Presented with bright red blood oral cavity and episode of "hematemesis" with EMS.  INR 7.9 on admission on home Coumadin.  Also noted to have tongue laceration. Received vitamin K and KCentra -appreciate GI  -SCD's -will need to determine timing of restart of coumadin for DVT, diagnosed mid March 2022 -monitor for bleeding  Hyponatremia Hypomagnesemia -Trend BMP / urinary output -Replace electrolytes as indicated -Avoid nephrotoxic agents, ensure adequate renal perfusion  Lower extremity DVT anticoagulated with Coumadin at baseline Diagnosed 02/08/2021, anticoagulation on hold this admit with concern for bleeding  -hold anticoagulation -will repeat LE duplex, if DVT gone, would not restart anticoagulation  -if restarted, rec's for 3 months therapy for DVT   Best practice   Diet:  NPO, SLP eval pending Pain/Anxiety/Delirium protocol (if indicated): No VAP protocol (if indicated): Not indicated DVT prophylaxis: Contraindicated GI prophylaxis: PPI Glucose control:  SSI No Central venous access:  N/A Arterial line:  N/A Foley:  N/A Mobility:  bed rest  PT consulted: N/A Last date of multidisciplinary goals of care discussion:  Wife updated  with translator on 3/31. Code Status:  full code Disposition: SDU, transfer to [facility] as of 4/2 am   Labs   CBC: Recent Labs  Lab 02/24/21 1727 02/25/21 0032 02/25/21 0726 02/26/21 0216 02/27/21 0246  WBC 9.2 10.1 11.6* 8.8 10.1  HGB 12.0* 12.4* 12.5* 11.8* 12.1*  HCT 35.0* 36.1* 37.5* 35.6* 36.0*  MCV 100.3* 99.7 102.7* 102.9* 102.9*  PLT 69* 74* 84* 72* 65*    Basic Metabolic Panel: Recent Labs  Lab 02/24/21 0548 02/24/21 0607 02/25/21 0032 02/25/21 1109 02/25/21 1651 02/26/21 0216 02/26/21 1639 02/27/21 0246  NA 130* 132* 132*  --   --  132*  --  135  K 4.9 5.0 5.1  --   --  4.7  --  4.3  CL 104 103 107  --   --  103  --  103  CO2 17*  --  17*  --   --  21*  --  25  GLUCOSE 152* 147* 116*  --   --  151*  --  137*  BUN 25* 24* 31*  --   --  30*  --  27*  CREATININE 1.11 0.90 1.25*  --   --  1.05  --  0.71  CALCIUM 8.7*  --  8.5*  --   --  8.2*  --  8.4*  MG  --   --  1.7 1.9 1.9 1.9 1.9  --   PHOS  --   --  4.6 4.4 4.5 4.0 3.3  --    GFR: Estimated Creatinine Clearance: 114.8 mL/min (by C-G formula based on SCr of 0.71 mg/dL). Recent Labs  Lab 02/24/21 0548 02/24/21 1149 02/24/21 1727 02/25/21 0032 02/25/21 0726 02/25/21 1109 02/26/21 0216 02/27/21 0246  WBC 7.2 6.5   < > 10.1 11.6*  --  8.8 10.1  LATICACIDVEN 4.0* 2.0*  --   --   --  1.3  --   --    < > = values in this interval not displayed.    Liver Function Tests: Recent Labs  Lab 02/24/21 0548 02/26/21 0216  AST 139* 114*  ALT 53* 46*  ALKPHOS 349* 289*  BILITOT 9.0* 8.4*  PROT 6.6 5.5*  ALBUMIN 2.9* 2.4*   No results for input(s): LIPASE, AMYLASE in the last 168 hours. Recent Labs  Lab 02/24/21 0548 02/24/21 1149 02/25/21 0032 02/26/21 0940  AMMONIA 234* 138* 121* 79*    ABG    Component Value Date/Time   PHART 7.299 (L) 02/25/2021 0515   PCO2ART 37.8 02/25/2021 0515   PO2ART 107 02/25/2021 0515   HCO3 18.0 (L) 02/25/2021 0515   TCO2 19 (L) 02/24/2021 0607   ACIDBASEDEF  7.4 (H) 02/25/2021 0515   O2SAT 97.1 02/25/2021 0515     Coagulation Profile: Recent Labs  Lab 02/24/21 0548 02/24/21 1017 02/25/21 0032 02/26/21 0216  INR 7.9* 1.8* 1.8* 1.6*    Cardiac Enzymes: No results for input(s): CKTOTAL, CKMB, CKMBINDEX, TROPONINI in the last 168 hours.  HbA1C: Hemoglobin A1C  Date/Time Value Ref Range Status  01/17/2017 10:41 AM 5.9  Final  01/01/2016 11:26 AM 6.1  Final   Hgb A1c MFr Bld  Date/Time Value Ref Range Status  02/04/2021 03:57 PM 4.3 (L) 4.8 - 5.6 % Final    Comment:             Prediabetes: 5.7 - 6.4          Diabetes: >6.4          Glycemic control for adults with diabetes: <7.0     CBG: Recent Labs  Lab 02/26/21 1152 02/26/21 1552 02/26/21 1930 02/26/21 2325 02/27/21 0317  GLUCAP 138* 140* 142* 123* 127*      Critical care time: n/a   Noe Gens, MSN, APRN, NP-C, AGACNP-BC [facility] 02/27/2021, 8:09 AM   Please see [email or web address] for pager details.   From 7A-7P if no response, please call [phone] After hours, please call ELink [phone]

## 2021-02-27 NOTE — Progress Notes (Addendum)
Attending physician's note   I have taken an interval history, reviewed the chart and discussed his care on rounds.  I agree with the Advanced Practitioner's note, impression, and recommendations as outlined.  No evidence of GI bleeding and H/H remained stable.  Plan for cirrhotic management as outlined with follow-up in our office on 03/04/2021.  GI service will sign off at this time.  Please do not hesitate to contact GI on-call with additional questions or concerns.  [place], DO, FACG [phone] office             [facility] Progress Note  CC:  Hematemesis, elevated ammonia, cirrhosis  Subjective:  Awake and alert.  Wants to eat.  Was not awake enough to evaluated well with speech yesterday so they said only ice chips until he gets seen again today.    Objective:  Vital signs in last 24 hours: Temp:  [97.8 F (36.6 C)-98.3 F (36.8 C)] 97.8 F (36.6 C) (04/01 0800) Pulse Rate:  [82-108] 89 (04/01 1100) Resp:  [7-24] 15 (04/01 1100) BP: (118-158)/(63-89) 156/81 (04/01 1100) SpO2:  [93 %-100 %] 99 % (04/01 1100) Weight:  [99.9 kg] 99.9 kg (04/01 0500) Last BM Date:  (PTA, smears on 3/31) General:  Alert, Well-developed, in NAD Heart:  Regular rate and rhythm; no murmurs Pulm:  CTAB.  No W/R/R. Abdomen:  Soft, non-distended.  BS present.  Non-tender.   Extremities:  Without edema. Neurologic:  Alert and oriented x 4;  grossly normal neurologically. Psych:  Alert and cooperative. Normal mood and affect.  Intake/Output from previous day: 03/31 0701 - 04/01 0700 In: 668.7 [I.V.:132.9; NG/GT:335.8; IV Piggyback:200] Out: 1300 [Urine:1300] Intake/Output this shift: Total I/O In: 340 [P.O.:240; NG/GT:100] Out: 110 [Urine:110]  Lab Results: Recent Labs    02/25/21 0726 02/26/21 0216 02/27/21 0246  WBC 11.6* 8.8 10.1  HGB 12.5* 11.8* 12.1*  HCT 37.5* 35.6* 36.0*  PLT 84* 72* 65*   BMET Recent Labs    02/25/21 0032 02/26/21 0216  02/27/21 0246  NA 132* 132* 135  K 5.1 4.7 4.3  CL 107 103 103  CO2 17* 21* 25  GLUCOSE 116* 151* 137*  BUN 31* 30* 27*  CREATININE 1.25* 1.05 0.71  CALCIUM 8.5* 8.2* 8.4*   LFT Recent Labs    02/26/21 0216  PROT 5.5*  ALBUMIN 2.4*  AST 114*  ALT 46*  ALKPHOS 289*  BILITOT 8.4*   PT/INR Recent Labs    02/25/21 0032 02/26/21 0216  LABPROT 20.2* 18.7*  INR 1.8* 1.6*   Hepatitis Panel Recent Labs    02/25/21 1109  HEPBSAG NON REACTIVE  HCVAB NON REACTIVE  HEPAIGM NON REACTIVE  HEPBIGM NON REACTIVE    DG CHEST PORT 1 VIEW  Result Date: 02/26/2021 CLINICAL DATA:  Respiratory failure. EXAM: PORTABLE CHEST 1 VIEW COMPARISON:  02/24/2021. FINDINGS: NG tube has been repositioned, its tip is in the stomach. Endotracheal tube in stable position. Heart size normal. Low lung volumes with bibasilar atelectasis. Left base infiltrate cannot be excluded. Tiny bilateral pleural effusions cannot be excluded. No pneumothorax. IMPRESSION: 1. NG tube is been repositioned, its tip is in the stomach. Endotracheal tube in stable position. 2. Low lung volumes with bibasilar atelectasis. Left base infiltrate cannot be excluded. Tiny bilateral pleural effusions can not be excluded. Electronically Signed   By: Marcello Moores  Register   On: 02/26/2021 05:33   Assessment / Plan: #55 year old Hispanic male with decompensated NASH/EtOH cirrhosis, and alcoholic hepatitis. He did receive a  trial of steroids at initial diagnosis but this was discontinued due to lack of response. He continues abstinence from alcohol since December. Most recent MELDwas 25, but was unable to be calculated recently due to coumadin use.Was supposed to have an appt with [facility] this month.  #2Altered mental status with confusion and seizure-like activity:Ammonia level was elevated 234. Patient ultimately intubated due to inability to protect airway. Had recently been started on Xifaxan 550 mg twice daily and  lactulose at home.  Started on lactulose and xifaxan here.  #3Hematemesis:No esophageal varices seen on recent EGD in February although there was some noted on previous cross-sectional imaging. Hemoglobin currently stable at 12.1 g. Reports of hematemesis in route to the ED, but only scant blood noted with insertion of NGT per his nurse and so far no output from below.  Noted to have a tongue laceration as cause of bleeding.  No further sign of active bleeding.  #4Ascites:Weight has been stable. Last paracentesis was 2.6 L on March 12. Negative for SBP. He was on Lasix 40 mg daily and Aldactone 100 mg daily at home. He has had a diuresis of about 30 pounds overall.  No ascites noted on ultrasound here.  #5Supratherapeutic INR at 7.9 in the setting of Coumadin use due to recent DVT in March.Reversed down to 1.8.  #6Was Covid+2 weeks ago. Swab this admissionis negative.  #7 Hyponatremia: Resolved.  Na+ 135 today.  -Resume diuretics when able. -Should complete 5 days of coverage with Rocephin. -Continue lactulose 30 grams TID.  Continue xifaxan 550 mg BID. -Ok for diet from GI standpoint once cleared by speech. -He actually has an OV in our office with Dr. Tarri Glenn next week, 4/6.   LOS: 3 days   Laban Emperor. Zehr  02/27/2021, 11:52 AM

## 2021-02-27 NOTE — Progress Notes (Signed)
Lower extremity venous RT study completed.  Preliminary results relayed to provider on the floor.  See CV Proc for preliminary results report.   Darlin Coco, RDMS, RVT

## 2021-02-27 NOTE — Progress Notes (Signed)
  Speech Language Pathology Treatment: Dysphagia  Patient Details Name: Ian Hall MRN: [id number] DOB: October 13, 1966 Today's Date: 02/27/2021 Time: 9407-6808 SLP Time Calculation (min) (ACUTE ONLY): 12 min  Assessment / Plan / Recommendation Clinical Impression  F/u after yesterday's swallow assessment.  His mental status is much improved today - communication/voice are WNL.  Pt eagerly consumed crackers, jello, and 6 oz of thin liquid with normal mastication, brisk swallow, and no s/s of aspiration.  Dysphagia is resolved - recommend advancing diet to regular solids, thin liquids. D/C NG tube. No SLP f/u is needed - our service will sign off.   HPI HPI: 55 year old man with NASH/ethanol related cirrhosis. Admitted now with encephalopathy, possible seizure activity and acute respiratory failure.  He had bleeding from the mouth that appears to be from a tongue laceration.  ETT 3/29-31. Recent hospitalization 3/11-3/17/22 with hyponatremia, had apparent seizure activity at that time.  Also diagnosed with COVID-19 (incidental) and a lower extremity DVT.      SLP Plan  All goals met       Recommendations  Diet recommendations: Regular;Thin liquid Liquids provided via: Cup;Straw Medication Administration: Whole meds with liquid Supervision: Patient able to self feed                Oral Care Recommendations: Oral care BID Follow up Recommendations: None SLP Visit Diagnosis: Dysphagia, unspecified (R13.10) Plan: All goals met       GO              Sukaina Toothaker L. Tivis Ringer, [place] Office number [phone] Pager [phone]   Assunta Curtis 02/27/2021, 3:39 PM

## 2021-02-27 NOTE — Progress Notes (Signed)
Occupational Therapy Evaluation  Patient lives with spouse, cousin lives with them in a single level home with a couple steps to enter. Patient reports independent at baseline with self care, states needs a walker but does not have one/use AD. Currently patient limited by decreased balance with x1 posterior loss of balance ambulating to bathroom needing min A and hand held assist x2 to steady. Patient able to perform peri care seated on toilet set up assist. Upon standing with min A from toilet patient states needing to use again. Notified CNA patient seated on toilet with call light cord in hand. Recommend continued acute OT services to maximize patient safety and independence with self care in order to facilitate D/C home with family assist.     02/27/21 1407  OT Visit Information  Last OT Received On 02/27/21  Assistance Needed +1  PT/OT/SLP Co-Evaluation/Treatment Yes  Reason for Co-Treatment For patient/therapist safety;To address functional/ADL transfers  OT goals addressed during session ADL's and self-care  History of Present Illness 55 year old man with NASH/ethanol related cirrhosis. Admitted now with encephalopathy, possible seizure activity and acute respiratory failure.  He had bleeding from the mouth that appears to be from a tongue laceration.  Reportedly has not had alcohol since December 2021 but other notes indicate that he may have relapsed in January.  Recent hospitalization 3/11-3/17/22with hyponatremia, had apparent seizure activity at that time.  Also diagnosed with COVID-19 (incidental) and a lower extremity DVT.  He was discharged on warfarin.  Precautions  Precautions Fall  Precaution Comments denies h/o falls  Restrictions  Weight Bearing Restrictions No  Home Living  Family/patient expects to be discharged to: Private residence  Living Arrangements Spouse/significant other;Other relatives  Available Help at Discharge Family;Available 24 hours/day  Type of [place] to enter  Entrance Stairs-Number of Steps 2  Home Layout One level  Bathroom Shower/Tub Walk-in shower  [place] bars - tub/shower;Shower seat  Prior Function  Level of Independence Independent  Communication  Communication No difficulties  Pain Assessment  Pain Assessment No/denies pain  Cognition  Arousal/Alertness Awake/alert  Behavior During Therapy WFL for tasks assessed/performed  Overall Cognitive Status No family/caregiver present to determine baseline cognitive functioning  General Comments patient at times needing increased time to respond to questions with vacant look in his eyes, easily redirected  Upper Extremity Assessment  Upper Extremity Assessment Overall WFL for tasks assessed  Lower Extremity Assessment  Lower Extremity Assessment Defer to PT evaluation  Cervical / Trunk Assessment  Cervical / Trunk Assessment Normal  ADL  Overall ADL's  Needs assistance/impaired  Grooming Minimal assistance;Standing  Upper Body Bathing Set up;Sitting  Lower Body Bathing Minimal assistance;Sitting/lateral leans;Sit to/from stand  Upper Body Dressing  Set up;Sitting  Lower Body Dressing Minimal assistance;Sitting/lateral leans;Sit to/from Retail buyer Minimal assistance;Cueing for safety (commode over toilet)  Toilet Transfer Details (indicate cue type and reason) hand held assistance with x1 loss of balance ambulating to bathroom needing min A for safety  Toileting- Clothing Manipulation and Hygiene Set up;Sitting/lateral lean  Toileting - Clothing Manipulation Details (indicate cue type and reason) patient able to perform perianal care seated on toilet  Functional mobility during ADLs Minimal assistance;Cueing for safety  General ADL Comments patient needing increased assistance for self care due to impaired balance and safety awareness  Bed Mobility  Overal bed mobility Modified Independent   General bed mobility comments used bedrail, HOB up 30*  Transfers  Overall transfer level Needs assistance  Equipment used 2 person hand held assist  Transfers Sit to/from Stand  Sit to Stand Min assist  General transfer comment please see toilet transfer in ADL section for details  Balance  Overall balance assessment Needs assistance  Sitting-balance support Feet supported  Sitting balance-Leahy Scale Fair  Standing balance support Single extremity supported;Bilateral upper extremity supported;During functional activity  Standing balance-Leahy Scale Poor  Standing balance comment LOB posteriorly while walking  General Comments  General comments (skin integrity, edema, etc.) patient maintain 94% on room air with activity, HR 104  OT - End of Session  Activity Tolerance Patient tolerated treatment well  Patient left Other (comment) (seated on toilet, pull cord in reach)  Nurse Communication Mobility status;Other (comment) (notified CNA patient on toilet)  OT Assessment  OT Recommendation/Assessment Patient needs continued OT Services  OT Visit Diagnosis Unsteadiness on feet (R26.81)  OT Problem List Decreased activity tolerance;Impaired balance (sitting and/or standing);Decreased safety awareness  OT Plan  OT Frequency (ACUTE ONLY) Min 2X/week  OT Treatment/Interventions (ACUTE ONLY) Self-care/ADL training;Therapeutic activities;Patient/family education;Balance training  AM-PAC OT "6 Clicks" Daily Activity Outcome Measure (Version 2)  Help from another person eating meals? 4  Help from another person taking care of personal grooming? 3  Help from another person toileting, which includes using toliet, bedpan, or urinal? 3  Help from another person bathing (including washing, rinsing, drying)? 3  Help from another person to put on and taking off regular upper body clothing? 3  Help from another person to put on and taking off regular lower body clothing? 3  6 Click Score 19  OT  Recommendation  Follow Up Recommendations No OT follow up;Supervision/Assistance - 24 hour (at least initial)  OT Equipment Other (comment) (walker pending progress with ambulation/balance)  Individuals Consulted  Consulted and Agree with Results and Recommendations Patient  Acute Rehab OT Goals  Patient Stated Goal to be steady walking  OT Goal Formulation With patient  Time For Goal Achievement 03/13/21  Potential to Achieve Goals Good  OT Time Calculation  OT Start Time (ACUTE ONLY) 1327  OT Stop Time (ACUTE ONLY) 1346  OT Time Calculation (min) 19 min  OT General Charges  $OT Visit 1 Visit  OT Evaluation  $OT Eval Low Complexity 1 Low  Written Expression  Dominant Hand  (did not specify)   Delbert Phenix OT OT pager: [phone]

## 2021-02-27 NOTE — Progress Notes (Signed)
Lower extremity venous RT attempted x2. Patient was with speech pathology, now receiving nursing care. Will attempt again as schedule and patient availability permits.   Darlin Coco, RDMS, RVT.

## 2021-02-28 LAB — CBC
HCT: 33.8 % — ABNORMAL LOW (ref 39.0–52.0)
Hemoglobin: 11.4 g/dL — ABNORMAL LOW (ref 13.0–17.0)
MCH: 34.1 pg — ABNORMAL HIGH (ref 26.0–34.0)
MCHC: 33.7 g/dL (ref 30.0–36.0)
MCV: 101.2 fL — ABNORMAL HIGH (ref 80.0–100.0)
Platelets: 69 K/uL — ABNORMAL LOW (ref 150–400)
RBC: 3.34 MIL/uL — ABNORMAL LOW (ref 4.22–5.81)
RDW: 15.4 % (ref 11.5–15.5)
WBC: 8.6 K/uL (ref 4.0–10.5)
nRBC: 0 % (ref 0.0–0.2)

## 2021-02-28 LAB — COMPREHENSIVE METABOLIC PANEL
ALT: 49 U/L — ABNORMAL HIGH (ref 0–44)
AST: 153 U/L — ABNORMAL HIGH (ref 15–41)
Albumin: 2.4 g/dL — ABNORMAL LOW (ref 3.5–5.0)
Alkaline Phosphatase: 289 U/L — ABNORMAL HIGH (ref 38–126)
Anion gap: 4 — ABNORMAL LOW (ref 5–15)
BUN: 21 mg/dL — ABNORMAL HIGH (ref 6–20)
CO2: 27 mmol/L (ref 22–32)
Calcium: 8.3 mg/dL — ABNORMAL LOW (ref 8.9–10.3)
Chloride: 101 mmol/L (ref 98–111)
Creatinine, Ser: 0.72 mg/dL (ref 0.61–1.24)
GFR, Estimated: >60 mL/min (ref >60)
Glucose, Bld: 113 mg/dL — ABNORMAL HIGH (ref 70–99)
Potassium: 4.3 mmol/L (ref 3.5–5.1)
Sodium: 132 mmol/L — ABNORMAL LOW (ref 135–145)
Total Bilirubin: 7.2 mg/dL — ABNORMAL HIGH (ref 0.3–1.2)
Total Protein: 5.5 g/dL — ABNORMAL LOW (ref 6.5–8.1)

## 2021-02-28 LAB — GLUCOSE, CAPILLARY
Glucose-Capillary: 107 mg/dL — ABNORMAL HIGH (ref 70–99)
Glucose-Capillary: 110 mg/dL — ABNORMAL HIGH (ref 70–99)
Glucose-Capillary: 112 mg/dL — ABNORMAL HIGH (ref 70–99)
Glucose-Capillary: 118 mg/dL — ABNORMAL HIGH (ref 70–99)
Glucose-Capillary: 138 mg/dL — ABNORMAL HIGH (ref 70–99)
Glucose-Capillary: 138 mg/dL — ABNORMAL HIGH (ref 70–99)

## 2021-02-28 LAB — AMMONIA: Ammonia: 32 umol/L (ref 9–35)

## 2021-02-28 MED ORDER — SPIRONOLACTONE 25 MG PO TABS
100.0000 mg | ORAL_TABLET | Freq: Every day | ORAL | Status: DC
  Administered 2021-02-28 – 2021-03-01 (×2): 100 mg via ORAL
  Filled 2021-02-28 (×2): qty 4

## 2021-02-28 MED ORDER — PANTOPRAZOLE SODIUM 40 MG PO TBEC
40.0000 mg | DELAYED_RELEASE_TABLET | Freq: Two times a day (BID) | ORAL | Status: DC
  Administered 2021-02-28 – 2021-03-01 (×2): 40 mg via ORAL
  Filled 2021-02-28 (×2): qty 1

## 2021-02-28 MED ORDER — FUROSEMIDE 40 MG PO TABS
40.0000 mg | ORAL_TABLET | Freq: Every day | ORAL | Status: DC
  Administered 2021-02-28 – 2021-03-01 (×2): 40 mg via ORAL
  Filled 2021-02-28 (×2): qty 1

## 2021-02-28 MED ORDER — LEVETIRACETAM 500 MG PO TABS
500.0000 mg | ORAL_TABLET | Freq: Two times a day (BID) | ORAL | Status: DC
  Administered 2021-02-28 – 2021-03-01 (×2): 500 mg via ORAL
  Filled 2021-02-28 (×2): qty 1

## 2021-02-28 NOTE — Progress Notes (Signed)
PROGRESS NOTE    Ian Hall  FKC:[id number] DOB: 07-19-1966 DOA: 02/24/2021 PCP: Maximiano Coss, NP    Brief Narrative:  Ian Hall is a 55 year old male with past medical history significant for Karlene Lineman cirrhosis, lower extremity DVT on anticoagulation with Coumadin, anxiety, incidental Covid-19 infection (positive 02/06/21) who presented to [facility] on 3/29 via EMS with altered mental status with concern for seizure-like activity.  Wife reported on admission she saw him early in the morning with decreased mentation and possible seizure-like activity and bleeding from his mouth.  Upon EMS arrival, patient remained largely unresponsive and had an episode of hematemesis in route to the ED.  On arrival he had snoring respirations on presentation abnormal motor movements of the right lower extremity concerning for seizure.  He was subsequently intubated for airway protection and was later found to have a tongue laceration.  And patient was admitted to the [facility] service for further evaluation and management.  Upon evaluation, it was noted that he was encephalopathic secondary to hepatic encephalopathy and decompensated Karlene Lineman cirrhosis due to likely medication noncompliance.  Patient was successfully extubated on 02/26/2021 with improvement of mental status.  Patient was transferred to [facility] on 02/28/2021.   Assessment & Plan:   Active Problems:   GI bleed   Hepatic encephalopathy (HCC)   Alcoholic cirrhosis of liver without ascites (HCC)   Acute respiratory failure in setting of acute metabolic encephalopathy, POA Upon presentation, patient was noted to be obtunded and unable to protect his airway likely secondary to hepatic encephalopathy.  He was subsequently intubated and managed on the critical care service.  He was successfully extubated on 02/26/2021.  He remains alert and oriented now and oxygenating well on room air. --Continue incentive spirometry, pulmonary hygiene,  mobilization --Avoid sedating medications  Decompensated NASH/EtOH cirrhosis Patient reports abstinence from alcohol use since 10/2020.  Recent meld score 25, failed trial of steroids at time of diagnosis.  Home medications include furosemide and Aldactone.  Recently underwent upper/lower endoscopies with gastroenterology on 12/30/2020 with no gross abnormalities or varices identified.  Has been referred [facility] but unable to make it due to transportation issues.  Acute hepatitis panel negative on admission.  Patient was seen by gastroenterology during the admission --Ceftriaxone 2 g IV every 24 hours, plan 5-day course to end on 03/01/2021 --Lactulose 30 g 3 times daily (goal 2-3 soft BM/day) --Rifaximin 550 mg PO BID --Spironolactone 154m PO daily --Furosemide 483mPO daily --Patient has follow-up scheduled with [facility], Dr. BeTarri Glennn 03/04/21  Acute hepatic encephalopathy Questionable seizure Ammonia level on admission 234.  CT head with no acute intracranial abnormality.  EEG with diffuse slowing of the background activity (on sedation).  --Ammonia 324>>>32 --Continue Keppra 50017mID x 7 days (End 4/4) --Lactulose 30 g 3 times daily (goal 2-3 soft BM/day)  Upper GI bleed versus tongue laceration Coagulopathy/thrombocytopenia Supratherapeutic INR Patient presenting with bright red blood within oral cavity with episode of "hematemesis" per EMS.  INR 7.9 on admission, on Coumadin at home for history of right DVT.  Patient received vitamin K and Kcentra on arrival.  Repeat vascular duplex right lower extremity ultrasound negative for DVT.  Given his thrombocytopenia and supratherapeutic INR, Coumadin was discontinued and given resolution of DVT unlikely to need further anticoagulation at this point as risks greater than benefit.  Hyponatremia Hypomagnesemia --BMP daily, monitor urinary output --Replace electrolytes as needed  Right lower extremity VTE anticoagulated with  Coumadin Initially diagnosed 02/08/2021, started  on Coumadin.  INR elevated 7.9 on admission which was reversed with vitamin K and Kcentra.  Repeat lower extremity duplex ultrasound negative for DVT.  Will not restart Coumadin as DVT now resolved and risks outweigh benefits given his underlying cirrhosis and thrombocytopenia.   DVT prophylaxis: SCDs   Code Status: Full Code Family Communication: No family present at bedside this morning  Disposition Plan:  Level of care: Stepdown Status is: Inpatient  Remains inpatient appropriate because:Ongoing diagnostic testing needed not appropriate for outpatient work up, Unsafe d/c plan, IV treatments appropriate due to intensity of illness or inability to take PO and Inpatient level of care appropriate due to severity of illness   Dispo: The patient is from: Home              Anticipated d/c is to: Home              Patient currently is not medically stable to d/c.   Difficult to place patient No   Consultants:   PCCM  [facility]  Procedures:   EEG  Intubated 3/29  Extubated 3/31  Antimicrobials:   Ceftriaxone 3/29>>    Subjective: Patient seen and examined bedside, resting comfortably.  Eating crackers and drinking water.  No specific complaints this morning other than concerned about ability to afford his medications, which is likely leading etiology to his hospitalization.  Also reports slightly blurred vision, but does not have his glasses currently.  No family present at bedside.  Denies headache, no chest pain, palpitations, no shortness of breath, no abdominal pain, no fever/chills/night sweats, no nausea/vomiting/diarrhea, no weakness, no fatigue, no paresthesias.  No acute events overnight per nursing staff.  Objective: Vitals:   02/28/21 0500 02/28/21 0600 02/28/21 0800 02/28/21 0815  BP: (!) 122/59 (!) 128/51 (!) 148/69   Pulse: 78 76 85   Resp: 11 11 16    Temp:    98.2 F (36.8 C)  TempSrc:    Oral  SpO2: 96%  96% 97%   Weight: 100.4 kg     Height:        Intake/Output Summary (Last 24 hours) at 02/28/2021 0929 Last data filed at 02/28/2021 0400 Gross per 24 hour  Intake 1266.78 ml  Output --  Net 1266.78 ml   Filed Weights   02/25/21 0446 02/27/21 0500 02/28/21 0500  Weight: 93.6 kg 99.9 kg 100.4 kg    Examination:  General exam: Appears calm and comfortable, chronically ill in appearance Respiratory system: Clear to auscultation. Respiratory effort normal.  On room air Cardiovascular system: S1 & S2 heard, RRR. No JVD, murmurs, rubs, gallops or clicks. No pedal edema. Gastrointestinal system: Abdomen is protuberant, soft and nontender. No organomegaly or masses felt. Normal bowel sounds heard. Central nervous system: Alert and oriented. No focal neurological deficits. Extremities: Symmetric 5 x 5 power. Skin: No rashes, lesions or ulcers Psychiatry: Judgement and insight appear normal. Mood & affect appropriate.     Data Reviewed: I have personally reviewed following labs and imaging studies  CBC: Recent Labs  Lab 02/25/21 0032 02/25/21 0726 02/26/21 0216 02/27/21 0246 02/28/21 0246  WBC 10.1 11.6* 8.8 10.1 8.6  HGB 12.4* 12.5* 11.8* 12.1* 11.4*  HCT 36.1* 37.5* 35.6* 36.0* 33.8*  MCV 99.7 102.7* 102.9* 102.9* 101.2*  PLT 74* 84* 72* 65* 69*   Basic Metabolic Panel: Recent Labs  Lab 02/24/21 0548 02/24/21 0607 02/25/21 0032 02/25/21 1109 02/25/21 1651 02/26/21 0216 02/26/21 1639 02/27/21 0246 02/28/21 0246  NA 130* 132* 132*  --   --  132*  --  135 132*  K 4.9 5.0 5.1  --   --  4.7  --  4.3 4.3  CL 104 103 107  --   --  103  --  103 101  CO2 17*  --  17*  --   --  21*  --  25 27  GLUCOSE 152* 147* 116*  --   --  151*  --  137* 113*  BUN 25* 24* 31*  --   --  30*  --  27* 21*  CREATININE 1.11 0.90 1.25*  --   --  1.05  --  0.71 0.72  CALCIUM 8.7*  --  8.5*  --   --  8.2*  --  8.4* 8.3*  MG  --   --  1.7 1.9 1.9 1.9 1.9  --   --   PHOS  --   --  4.6 4.4 4.5  4.0 3.3  --   --    GFR: Estimated Creatinine Clearance: 115.1 mL/min (by C-G formula based on SCr of 0.72 mg/dL). Liver Function Tests: Recent Labs  Lab 02/24/21 0548 02/26/21 0216 02/28/21 0246  AST 139* 114* 153*  ALT 53* 46* 49*  ALKPHOS 349* 289* 289*  BILITOT 9.0* 8.4* 7.2*  PROT 6.6 5.5* 5.5*  ALBUMIN 2.9* 2.4* 2.4*   No results for input(s): LIPASE, AMYLASE in the last 168 hours. Recent Labs  Lab 02/24/21 0548 02/24/21 1149 02/25/21 0032 02/26/21 0940 02/28/21 0246  AMMONIA 234* 138* 121* 79* 32   Coagulation Profile: Recent Labs  Lab 02/24/21 0548 02/24/21 1017 02/25/21 0032 02/26/21 0216  INR 7.9* 1.8* 1.8* 1.6*   Cardiac Enzymes: No results for input(s): CKTOTAL, CKMB, CKMBINDEX, TROPONINI in the last 168 hours. BNP (last 3 results) No results for input(s): PROBNP in the last 8760 hours. HbA1C: No results for input(s): HGBA1C in the last 72 hours. CBG: Recent Labs  Lab 02/27/21 1636 02/27/21 1921 02/27/21 2331 02/28/21 0328 02/28/21 0804  GLUCAP 129* 133* 100* 138* 138*   Lipid Profile: No results for input(s): CHOL, HDL, LDLCALC, TRIG, CHOLHDL, LDLDIRECT in the last 72 hours. Thyroid Function Tests: No results for input(s): TSH, T4TOTAL, FREET4, T3FREE, THYROIDAB in the last 72 hours. Anemia Panel: No results for input(s): VITAMINB12, FOLATE, FERRITIN, TIBC, IRON, RETICCTPCT in the last 72 hours. Sepsis Labs: Recent Labs  Lab 02/24/21 0548 02/24/21 1149 02/25/21 1109  LATICACIDVEN 4.0* 2.0* 1.3    Recent Results (from the past 240 hour(s))  Culture, blood (single) w Reflex to ID Panel     Status: None (Preliminary result)   Collection Time: 02/24/21  5:50 AM   Specimen: BLOOD  Result Value Ref Range Status   Specimen Description   Final    BLOOD RIGHT ANTECUBITAL Performed at [facility], [place] [address], [place], [place] [address]    Special Requests   Final    BOTTLES DRAWN AEROBIC AND ANAEROBIC Blood  Culture adequate volume Performed at [facility] [address], [place], [place] [address]    Culture   Final    NO GROWTH 3 DAYS Performed at [facility], [place] [address] [place], [place] [address]    Report Status PENDING  Incomplete  Resp Panel by RT-PCR (Flu A&B, Covid) Nasopharyngeal Swab     Status: None   Collection Time: 02/24/21  8:08 AM   Specimen: Nasopharyngeal Swab; Nasopharyngeal(NP) swabs in vial transport medium  Result Value Ref Range Status  SARS Coronavirus 2 by RT PCR NEGATIVE NEGATIVE Final    Comment: (NOTE) SARS-CoV-2 target nucleic acids are NOT DETECTED.  The SARS-CoV-2 RNA is generally detectable in upper respiratory specimens during the acute phase of infection. The lowest concentration of SARS-CoV-2 viral copies this assay can detect is 138 copies/mL. A negative result does not preclude SARS-Cov-2 infection and should not be used as the sole basis for treatment or other patient management decisions. A negative result may occur with  improper specimen collection/handling, submission of specimen other than nasopharyngeal swab, presence of viral mutation(s) within the areas targeted by this assay, and inadequate number of viral copies(<138 copies/mL). A negative result must be combined with clinical observations, patient history, and epidemiological information. The expected result is Negative.  Fact Sheet for Patients:  EntrepreneurPulse.com.au  Fact Sheet for Healthcare Providers:  IncredibleEmployment.be  This test is no t yet approved or cleared by the [place] FDA and  has been authorized for detection and/or diagnosis of SARS-CoV-2 by FDA under an Emergency Use Authorization (EUA). This EUA will remain  in effect (meaning this test can be used) for the duration of the COVID-19 declaration under Section 564(b)(1) of the Act, 21 U.S.C.section 360bbb-3(b)(1), unless the  authorization is terminated  or revoked sooner.       Influenza A by PCR NEGATIVE NEGATIVE Final   Influenza B by PCR NEGATIVE NEGATIVE Final    Comment: (NOTE) The Xpert Xpress SARS-CoV-2/FLU/RSV plus assay is intended as an aid in the diagnosis of influenza from Nasopharyngeal swab specimens and should not be used as a sole basis for treatment. Nasal washings and aspirates are unacceptable for Xpert Xpress SARS-CoV-2/FLU/RSV testing.  Fact Sheet for Patients: EntrepreneurPulse.com.au  Fact Sheet for Healthcare Providers: IncredibleEmployment.be  This test is not yet approved or cleared by the [place] FDA and has been authorized for detection and/or diagnosis of SARS-CoV-2 by FDA under an Emergency Use Authorization (EUA). This EUA will remain in effect (meaning this test can be used) for the duration of the COVID-19 declaration under Section 564(b)(1) of the Act, 21 U.S.C. section 360bbb-3(b)(1), unless the authorization is terminated or revoked.  Performed at [facility], [place] [address], [place], [place] [address]   MRSA PCR Screening     Status: None   Collection Time: 02/26/21 10:10 AM   Specimen: Nasal Mucosa; Nasopharyngeal  Result Value Ref Range Status   MRSA by PCR NEGATIVE NEGATIVE Final    Comment:        The GeneXpert MRSA Assay (FDA approved for NASAL specimens only), is one component of a comprehensive MRSA colonization surveillance program. It is not intended to diagnose MRSA infection nor to guide or monitor treatment for MRSA infections. Performed at [facility], [place] [address], [place],  [address]          Radiology Studies: VAS [place] LOWER EXTREMITY VENOUS (DVT)  Result Date: 02/27/2021  Lower Venous DVT Study Indications: History of DVT RT.  Comparison Study: 02-08-2021 Lower extremity venous bilateral was postive for                   acute DVT involving  the RT PTV. Performing Technologist: Darlin Coco RDMS,RVT  Examination Guidelines: A complete evaluation includes B-mode imaging, spectral Doppler, color Doppler, and power Doppler as needed of all accessible portions of each vessel. Bilateral testing is considered an integral part of a complete examination. Limited examinations for reoccurring indications may be performed as noted. The  reflux portion of the exam is performed with the patient in reverse Trendelenburg.  +---------+---------------+---------+-----------+----------+--------------+ RIGHT    CompressibilityPhasicitySpontaneityPropertiesThrombus Aging +---------+---------------+---------+-----------+----------+--------------+ CFV      Full           Yes      Yes                                 +---------+---------------+---------+-----------+----------+--------------+ SFJ      Full                                                        +---------+---------------+---------+-----------+----------+--------------+ FV Prox  Full                                                        +---------+---------------+---------+-----------+----------+--------------+ FV Mid   Full                                                        +---------+---------------+---------+-----------+----------+--------------+ FV DistalFull                                                        +---------+---------------+---------+-----------+----------+--------------+ PFV      Full                                                        +---------+---------------+---------+-----------+----------+--------------+ POP      Full           Yes      Yes                                 +---------+---------------+---------+-----------+----------+--------------+ PTV      Full                                                        +---------+---------------+---------+-----------+----------+--------------+ PERO     Full                                                         +---------+---------------+---------+-----------+----------+--------------+   +----+---------------+---------+-----------+----------+--------------+ LEFTCompressibilityPhasicitySpontaneityPropertiesThrombus Aging +----+---------------+---------+-----------+----------+--------------+ CFV Full           Yes      Yes                                 +----+---------------+---------+-----------+----------+--------------+  Summary: RIGHT: - Findings appear improved from previous examination. - There is no evidence of deep vein thrombosis in the lower extremity.  - No cystic structure found in the popliteal fossa.  LEFT: - No evidence of common femoral vein obstruction.  *See table(s) above for measurements and observations. Electronically signed by Monica Martinez MD on 02/27/2021 at 4:57:10 PM.    Final         Scheduled Meds: . Chlorhexidine Gluconate Cloth  6 each Topical Daily  . docusate sodium  100 mg Oral BID  . furosemide  40 mg Oral Daily  . lactulose  30 g Oral TID  . levETIRAcetam  500 mg Oral BID  . mouth rinse  15 mL Mouth Rinse BID  . pantoprazole  40 mg Oral BID  . polyethylene glycol  17 g Oral Daily  . rifaximin  550 mg Oral BID  . spironolactone  100 mg Oral Daily   Continuous Infusions: . sodium chloride Stopped (02/25/21 1403)  . cefTRIAXone (ROCEPHIN)  IV Stopped (02/27/21 1132)     LOS: 4 days    Time spent: 42 minutes spent on chart review, discussion with nursing staff, consultants, updating family and interview/physical exam; more than 50% of that time was spent in counseling and/or coordination of care.    Jamez Ambrocio J [place] DO [facility] Available via Epic secure chat 7am-7pm After these hours, please refer to coverage provider listed on [email or web address] 02/28/2021, 9:29 AM

## 2021-03-01 DIAGNOSIS — E871 Hypo-osmolality and hyponatremia: Secondary | ICD-10-CM

## 2021-03-01 LAB — COMPREHENSIVE METABOLIC PANEL
ALT: 56 U/L — ABNORMAL HIGH (ref 0–44)
AST: 184 U/L — ABNORMAL HIGH (ref 15–41)
Albumin: 2.3 g/dL — ABNORMAL LOW (ref 3.5–5.0)
Alkaline Phosphatase: 325 U/L — ABNORMAL HIGH (ref 38–126)
Anion gap: 6 (ref 5–15)
BUN: 21 mg/dL — ABNORMAL HIGH (ref 6–20)
CO2: 25 mmol/L (ref 22–32)
Calcium: 8.3 mg/dL — ABNORMAL LOW (ref 8.9–10.3)
Chloride: 98 mmol/L (ref 98–111)
Creatinine, Ser: 0.86 mg/dL (ref 0.61–1.24)
GFR, Estimated: >60 mL/min (ref >60)
Glucose, Bld: 111 mg/dL — ABNORMAL HIGH (ref 70–99)
Potassium: 4.1 mmol/L (ref 3.5–5.1)
Sodium: 129 mmol/L — ABNORMAL LOW (ref 135–145)
Total Bilirubin: 6.3 mg/dL — ABNORMAL HIGH (ref 0.3–1.2)
Total Protein: 5.1 g/dL — ABNORMAL LOW (ref 6.5–8.1)

## 2021-03-01 LAB — CBC
HCT: 31.9 % — ABNORMAL LOW (ref 39.0–52.0)
Hemoglobin: 10.9 g/dL — ABNORMAL LOW (ref 13.0–17.0)
MCH: 34.1 pg — ABNORMAL HIGH (ref 26.0–34.0)
MCHC: 34.2 g/dL (ref 30.0–36.0)
MCV: 99.7 fL (ref 80.0–100.0)
Platelets: 76 10*3/uL — ABNORMAL LOW (ref 150–400)
RBC: 3.2 MIL/uL — ABNORMAL LOW (ref 4.22–5.81)
RDW: 15.2 % (ref 11.5–15.5)
WBC: 7.5 10*3/uL (ref 4.0–10.5)
nRBC: 0 % (ref 0.0–0.2)

## 2021-03-01 LAB — GLUCOSE, CAPILLARY
Glucose-Capillary: 120 mg/dL — ABNORMAL HIGH (ref 70–99)
Glucose-Capillary: 95 mg/dL (ref 70–99)
Glucose-Capillary: 152 mg/dL — ABNORMAL HIGH (ref 70–99)

## 2021-03-01 LAB — CULTURE, BLOOD (SINGLE)
Culture: NO GROWTH
Special Requests: ADEQUATE

## 2021-03-01 LAB — MAGNESIUM: Magnesium: 1.5 mg/dL — ABNORMAL LOW (ref 1.7–2.4)

## 2021-03-01 MED ORDER — FUROSEMIDE 40 MG PO TABS: 40.0000 mg | ORAL_TABLET | Freq: Every day | ORAL | 0 refills | Status: DC

## 2021-03-01 MED ORDER — LACTULOSE 10 GM/15ML PO SOLN: 30.0000 g | Freq: Three times a day (TID) | ORAL | 0 refills | Status: DC

## 2021-03-01 MED ORDER — SPIRONOLACTONE 100 MG PO TABS: 100.0000 mg | ORAL_TABLET | Freq: Every day | ORAL | 0 refills | Status: DC

## 2021-03-01 MED ORDER — RIFAXIMIN 550 MG PO TABS: 550.0000 mg | ORAL_TABLET | Freq: Two times a day (BID) | ORAL | 0 refills | Status: DC

## 2021-03-01 MED ORDER — MAGNESIUM SULFATE 4 GM/100ML IV SOLN
4.0000 g | Freq: Once | INTRAVENOUS | Status: AC
  Administered 2021-03-01: 4 g via INTRAVENOUS
  Filled 2021-03-01: qty 100

## 2021-03-01 MED ORDER — PANTOPRAZOLE SODIUM 40 MG PO TBEC: 40.0000 mg | DELAYED_RELEASE_TABLET | Freq: Two times a day (BID) | ORAL | 0 refills | Status: DC

## 2021-03-01 MED ORDER — LEVETIRACETAM 500 MG PO TABS: 500.0000 mg | ORAL_TABLET | Freq: Two times a day (BID) | ORAL | 0 refills | Status: DC

## 2021-03-01 NOTE — Progress Notes (Signed)
PT DC home with family, patient alert and oriented x 4, vital signs stable upon discharge, patient and family voiced understanding of DC instructions. Pt taken out via wheelchair.

## 2021-03-01 NOTE — Discharge Instructions (Signed)
Hepatic Encephalopathy  Hepatic encephalopathy is a change in brain function that includes changes in the ability to think and to use muscles. This condition happens when a person has advanced liver disease. When the liver is damaged, harmful substances (toxins) can build up in the body. Some of these toxins, such as ammonia, can harm the brain. The effects of the condition depend on the type of liver damage and how severe it is. In some cases, hepatic encephalopathy can be reversed. What are the causes? Certain things can trigger or worsen liver function, which can result in hepatic encephalopathy. These things include:  Infection.  Constipation.  Taking certain medicines, such as benzodiazepines.  Alcohol use.  Bleeding into the intestinal tract.  Imbalances in minerals (electrolytes) in the body.  Dehydration. Hepatic encephalopathy can sometimes be reversed if these triggers are resolved. What increases the risk? You are at risk of developing this condition if you have advanced liver disease (cirrhosis). Conditions that can cause liver disease include:  Infections in the liver, such as hepatitis C.  Infections in the blood.  Drinking a lot of alcohol over a long period of time.  Taking certain medicines, including tranquilizers, diuretics, antidepressants, sleeping pills, or acetaminophen.  Genetic diseases, such as Wilson's disease. What are the signs or symptoms? Symptoms may develop suddenly or may develop slowly and get worse gradually. Symptoms can range from mild to severe. Mild symptoms include:  Mild confusion.  Shortened attention span.  Personality and mood changes.  Anxiety and agitation.  Drowsiness. Symptoms of worsening or severe hepatic encephalopathy include:  Extreme confusion (disorientation).  Slowed movement.  Slurred speech.  Extreme personality changes.  Abnormal shaking or flapping of the hands (asterixis).  Coma. How is this  diagnosed? This condition may be diagnosed based on:  A physical exam.  Your symptoms and medical history.  Blood tests. These may be done to check levels of ammonia in your blood, measure how long it takes your blood to clot, or check for infection.  Liver function tests. These may be done to check how well your liver is working.  MRI and CT scans. These may be done to check for a brain disorder and to check for problems with your liver.  Electroencephalogram (EEG). This test measures the electrical activity in your brain. How is this treated? The first step in treatment is to identify and treat the cause of your liver damage or triggering illness, if possible. The next step is taking medicine to lower the level of toxins in your body and prevent ammonia from building up. Treatment will depend on how severe your encephalopathy is, and may include:  Medicine to lower your ammonia level (lactulose).  Antibiotic medicine to reduce the amount of ammonia-producing bacteria in your gut.  Close monitoring of your blood pressure, heart rate, breathing, and oxygen levels.  Removal of fluid from your abdomen.  Close monitoring of how you think, feel, and act (mental status).  Changes to your diet.  Liver transplant, in severe cases. Follow these instructions at home: Medicines  Take over-the-counter and prescription medicines only as told by your health care provider.  If you were prescribed an antibiotic medicine, take it as told by your health care provider. Do not stop using the antibiotic even if you start to feel better.  Do not start taking any new medicines, including over-the-counter medicines, without first checking with your health care provider. Eating and drinking  Work with a dietitian or your health care provider  to make sure you are getting the right balance of protein and minerals.  Eat small meals throughout the day with a late-night snack of complex carbohydrates.  Do not fast.  Drink enough fluids to keep your urine pale yellow.  Do not drink alcohol.   General instructions  Do not use drugs.  Ask your health care provider if it is safe for you to drive.  Keep all follow-up visits. This is important. Contact a health care provider if:  You develop new symptoms.  Your symptoms change or get worse.  You have a fever or chills.  You have persistent nausea, vomiting, or diarrhea. Get help right away if:  You become very confused or drowsy.  You vomit blood or material that looks like coffee grounds.  Your stool is bloody, black, or looks like tar. Summary  Hepatic encephalopathy is a change in brain function that includes changes in the ability to think and to use muscles. This condition happens when a person has advanced liver disease.  Certain things can trigger or worsen hepatic encephalopathy. Hepatic encephalopathy can sometimes be reversed if these triggers are resolved.  The first step in treatment is to identify and treat the cause of your liver damage or triggering illness, if possible. The next step is taking medicine to lower the level of toxins in your body and prevent ammonia from building up.  Your treatment will depend on how severe your hepatic encephalopathy is. This information is not intended to replace advice given to you by your health care provider. Make sure you discuss any questions you have with your health care provider. Document Revised: 08/12/2020 Document Reviewed: 08/12/2020 Elsevier Patient Education  2021 [place].   Cirrhosis  Cirrhosis is long-term (chronic) liver injury. The liver is the body's largest internal organ, and it performs many functions. It converts food into energy, removes toxic material from the blood, makes important proteins, and absorbs necessary vitamins from food. In cirrhosis, healthy liver cells are replaced by scar tissue. This prevents blood from flowing through the liver  and makes it difficult for the liver to complete its functions. What are the causes? Common causes of this condition are hepatitis C and long-term alcohol abuse. Other causes include:  Nonalcoholic fatty liver disease (NAFLD). This happens when fat is deposited in the liver by causes other than alcohol.  Hepatitis B infection.  Autoimmune hepatitis. In this condition, the body's defense system (immune system) mistakenly attacks the liver cells, causing inflammation.  Diseases that cause blockage of ducts inside the liver.  Inherited liver diseases, such as hemochromatosis. This is one of the most common inherited liver diseases. In this disease, deposits of iron collect in the liver and other organs.  Reactions to certain long-term medicines, such as amiodarone, a heart medicine.  Parasitic infections. These include schistosomiasis, which is caused by a flatworm.  Long-term contact to certain toxins. These toxins include certain organic solvents, such as toluene and chloroform. What increases the risk? You are more likely to develop this condition if:  You have certain types of viral hepatitis.  You abuse alcohol, especially if you are male.  You are overweight.  You use IV drugs and share needles.  You have unprotected sex with someone who has viral hepatitis. What are the signs or symptoms? You may not have any signs and symptoms at first. Symptoms may not develop until the damage to your liver starts to get worse. Early symptoms may include:  Weakness and tiredness (fatigue).  Changes in sleep patterns or having trouble sleeping.  Itchiness.  Tenderness in the right-upper part of your abdomen.  Weight loss and muscle loss.  Nausea.  Loss of appetite. Later symptoms may include:  Fatigue or weakness that is getting worse.  Yellow skin and eyes (jaundice).  Buildup of fluid in the abdomen (ascites). You may notice that your clothes are tight around your  waist.  Weight gain and swelling of the feet and ankles (edema).  Trouble breathing.  Easy bruising and bleeding.  Vomiting blood, or black or bloody stool.  Mental confusion. How is this diagnosed? Your health care provider may suspect cirrhosis based on your symptoms and medical history, especially if you have other medical conditions or a history of alcohol abuse. Your health care provider will do a physical exam to feel your liver and to check for signs of cirrhosis. Tests may include:  Blood tests to check: ? For hepatitis B or C. ? Kidney function. ? Liver function.  Imaging tests such as: ? MRI or CT scan to look for changes seen in advanced cirrhosis. ? Ultrasound to see if normal liver tissue is being replaced by scar tissue.  A procedure in which a long needle is used to take a sample of liver tissue to be checked in a lab (biopsy). Liver biopsy can confirm the diagnosis of cirrhosis. How is this treated? Treatment for this condition depends on how damaged your liver is and what caused the damage. It may include treating the symptoms of cirrhosis, or treating the underlying causes to slow the damage. Treatment may include:  Making lifestyle changes, such as: ? Eating a healthy diet. You may need to work with your health care provider or a dietitian to develop an eating plan. ? Restricting salt intake. ? Maintaining a healthy weight. ? Not abusing drugs or alcohol.  Taking medicines to: ? Treat liver infections or other infections. ? Control itching. ? Reduce fluid buildup. ? Reduce certain blood toxins. ? Reduce risk of bleeding from enlarged blood vessels in the stomach or esophagus (varices).  Liver transplant. In this procedure, a liver from a donor is used to replace your diseased liver. This is done if cirrhosis has caused liver failure. Other treatments and procedures may be done depending on the problems that you get from cirrhosis. Common problems include  liver-related kidney failure (hepatorenal syndrome). Follow these instructions at home:  Take medicines only as told by your health care provider. Do not use medicines that are toxic to your liver. Ask your health care provider before taking any new medicines, including over-the-counter medicines such as NSAIDs.  Rest as needed.  Eat a well-balanced diet.  Limit your salt or water intake, if your health care provider asks you to do this.  Do not drink alcohol. This is especially important if you routinely take acetaminophen.  Keep all follow-up visits. This is important.   Contact a health care provider if you:  Have fatigue or weakness that is getting worse.  Develop swelling of the hands, feet, or legs, or a buildup of fluid in the abdomen (ascites).  Have a fever or chills.  Develop loss of appetite.  Have nausea or vomiting.  Develop jaundice.  Develop easy bruising or bleeding. Get help right away if you:  Vomit bright red blood or a material that looks like coffee grounds.  Have blood in your stools.  Notice that your stools appear black and tarry.  Become confused.  Have chest pain  or trouble breathing. These symptoms may represent a serious problem that is an emergency. Do not wait to see if the symptoms will go away. Get medical help right away. Call your local emergency services (911 in the [place] Do not drive yourself to the hospital. Summary  Cirrhosis is chronic liver injury. Common causes are hepatitis C and long-term alcohol abuse.  Tests used to diagnose cirrhosis include blood tests, imaging tests, and liver biopsy.  Treatment for this condition involves treating the underlying cause. Avoid alcohol, drugs, salt, and medicines that may damage your liver.  Get help right away if you vomit bright red blood or a material that looks like coffee grounds. This information is not intended to replace advice given to you by your health care provider. Make sure  you discuss any questions you have with your health care provider. Document Revised: 08/28/2020 Document Reviewed: 08/28/2020 Elsevier Patient Education  2021 [place] para la enfermedad heptica por hgado graso no alcohlico en los adultos Nonalcoholic Fatty Liver Disease Diet, Adult La enfermedad heptica por hgado graso no alcohlico es una afeccin que hace que se acumule grasa dentro y alrededor del hgado. La enfermedad dificulta el correcto funcionamiento del hgado. Seguir una dieta saludable puede ayudar a Theatre manager bajo control la enfermedad heptica por hgado graso no alcohlico. Tambin puede ayudar a prevenir o mejorar afecciones asociadas con la enfermedad, como las enfermedades cardacas, la diabetes, la hipertensin arterial y los niveles anormales de [place]. Junto con la actividad fsica regular, esta dieta:  Promueve la prdida de [place].  Ayuda a Environmental consultant de Dispensing optician.  Ayuda a mejorar la forma en que el organismo [place] la insulina. Cules son algunos consejos para seguir este plan? Lea las etiquetas de los alimentos Siempre lea las etiquetas de los alimentos para Civil engineer, contracting lo siguiente:  La cantidad de grasas saturadas que tiene el alimento. Debe limitar el consumo de grasas saturadas. Las grasas saturadas se encuentran en los alimentos que provienen de [place]; entre ellos, la carne y los productos lcteos, como la [place], [place] queso y la [place].  La cantidad de fibra que tiene el alimento. Debe elegir alimentos ricos en fibra, como frutas, verduras, cereales integrales. Trate de consumir de 25a 30gramos (g) de [employer].   Al cocinar  Cuando cocine, use aceites cardiosaludables con alto contenido de grasas monoinsaturadas. Estos incluyen aceite de [place], aceite de canola y aceite de Musician.  Limite los alimentos fritos o refritos. A la hora de cocinar los alimentos, opte por hornearlos, hervirlos, cocerlos al vapor y  asarlos a la parrilla. Planificacin de las comidas  Es recomendable que lleve un registro de la cantidad de caloras que ingiere. La ingesta de la cantidad correcta de caloras lo ayudar a Science writer un peso saludable. Consultar a un nutricionista matriculado puede ayudarlo a Medical laboratory scientific officer.  Limite la frecuencia con la que come alimentos para llevar y comidas rpidas. Estos alimentos suelen ser [employer] en grasas, sal y azcar.  Use el ndice glucmico (IG) para planificar las comidas. El ndice le informa con qu rapidez un alimento elevar su nivel de azcar en la sangre. Elija alimentos con bajo IG (IG de menos de 55). Estos tardan ms tiempo en subir el nivel de azcar en la sangre. Un nutricionista matriculado puede ayudarlo a identificar los alimentos que son ms bajos en la escala del IG. Estilo de vida  Tal vez deba seguir Web designer. Esta dieta incluye gran cantidad  de verduras, carnes magras o pescado, cereales integrales y frutas, as como aceites y grasas saludables. Qu alimentos puedo comer? Lambert Mody Bananas. Manzanas. Naranjas. Uvas. Papaya. Mango. [place]. Kiwi. Pomelo. Cerezas. [place] Valeda Malm. Espinaca. Guisantes. Remolachas. Coliflor. Repollo. Brcoli. Zanahorias. Tomates. [place]aza. Augustin Coupe. Hierbas. Pimientos. Cebollas. Pepinos. Coles de Bruselas. ames y batatas. Frijoles. Lentejas. Granos Alimentos con trigo integral u otros granos integrales, como panes, Administrator, cereales y pasta. Salvado molido. Avena sin azcar. Trigo burgol. Cebada. Quinua. Arroz integral o salvaje. Tortillas de harina de maz o trigo integral. Carnes y otras protenas Carnes magras. Aves. Tofu. Frutos de mar y Occupational hygienist. Lcteos Productos lcteos descremados o semidescremados, como yogur, queso cottage o [place]. [employer]. Bebidas sin azcar. T. Caf. Leche descremada o semidescremada. Productos alternativos a la [place], como [place] de soja o de [place]. Jugo de frutas naturales. Grasas  y Medical laboratory scientific officer. Aceite de canola o de oliva. Frutos secos y Engineer, mining de frutos secos. Semillas. Alios y condimentos [place]. Salsa de pepinillos. Salsa barbacoa y ktchup con bajo contenido de grasa y de Location manager. [place] con bajo contenido de grasa o sin grasa. Dulces y postres Dulces sin azcar. Es posible que los productos que se enumeran ms [place] no constituyan una lista completa de los alimentos y las bebidas que puede tomar. Consulte a un nutricionista para obtener ms informacin.   Qu alimentos debo limitar o evitar? Carnes y otras protenas Limite la carne roja a 1 o 2 veces por semana. Lcteos Lcteos enteros. Grasas y aceites Aceite de palma y de coco. Comidas fritas. Otros alimentos Alimentos procesados. Alimentos que contengan mucha sal o sodio. Dulces y postres Dulces que Cabin crew. [address] como t [place], [place], bebidas dulces heladas y [place]. El alcohol. Es posible que los productos que se enumeran ms [place] no constituyan una lista completa de los alimentos y las bebidas que Nurse, adult. Consulte a un nutricionista para obtener ms informacin. Dnde buscar ms informacin [employer] of Diabetes and Digestive and Kidney Diseases ([place] Diabetes y las Enfermedades Digestivas y Renales): [employer].is Resumen  La enfermedad heptica por hgado graso no alcohlico es una afeccin que hace que se acumule grasa dentro y alrededor del hgado.  Seguir una dieta saludable puede ayudar a Theatre manager bajo control la enfermedad heptica por hgado graso no alcohlico. Su dieta debe ser rica en frutas, verduras, granos enteros y Advertising account planner.  Limite el consumo de grasas saturadas. Las grasas saturadas se encuentran en los alimentos que provienen de [place]; entre ellos, la carne y los productos lcteos, como la [place], [place] queso y la [place].  Esta dieta promueve la prdida de [place], [place] a  Chief Technology Officer los niveles de Dispensing optician y a Engineer, manufacturing systems forma en que el organismo utiliza la [place]. Esta informacin no tiene Marine scientist el consejo del mdico. Asegrese de hacerle al mdico cualquier pregunta que tenga. Document Revised: 01/10/2019 Document Reviewed: 01/10/2019 Elsevier Patient Education  2021 [place] para la enfermedad heptica por hgado graso no alcohlico en los adultos Nonalcoholic Fatty Liver Disease Diet, Adult La enfermedad heptica por hgado graso no alcohlico es una afeccin que hace que se acumule grasa dentro y alrededor del hgado. La enfermedad dificulta el correcto funcionamiento del hgado. Seguir una dieta saludable puede ayudar a Theatre manager bajo control la enfermedad heptica por hgado graso no alcohlico. Tambin puede ayudar a prevenir o mejorar afecciones asociadas con la enfermedad, como las enfermedades cardacas, la diabetes, la hipertensin arterial  y los niveles anormales de colesterol. Junto con la actividad fsica regular, esta dieta:  Promueve la prdida de [place].  Ayuda a Environmental consultant de Dispensing optician.  Ayuda a mejorar la forma en que el organismo [place] la insulina. Cules son algunos consejos para seguir este plan? Lea las etiquetas de los alimentos Siempre lea las etiquetas de los alimentos para Civil engineer, contracting lo siguiente:  La cantidad de grasas saturadas que tiene el alimento. Debe limitar el consumo de grasas saturadas. Las grasas saturadas se encuentran en los alimentos que provienen de [place]; entre ellos, la carne y los productos lcteos, como la [place], [place] queso y la [place].  La cantidad de fibra que tiene el alimento. Debe elegir alimentos ricos en fibra, como frutas, verduras, cereales integrales. Trate de consumir de 25a 30gramos (g) de [employer].   Al cocinar  Cuando cocine, use aceites cardiosaludables con alto contenido de grasas monoinsaturadas. Estos incluyen aceite de [place],  aceite de canola y aceite de Musician.  Limite los alimentos fritos o refritos. A la hora de cocinar los alimentos, opte por hornearlos, hervirlos, cocerlos al vapor y asarlos a la parrilla. Planificacin de las comidas  Es recomendable que lleve un registro de la cantidad de caloras que ingiere. La ingesta de la cantidad correcta de caloras lo ayudar a Science writer un peso saludable. Consultar a un nutricionista matriculado puede ayudarlo a Medical laboratory scientific officer.  Limite la frecuencia con la que come alimentos para llevar y comidas rpidas. Estos alimentos suelen ser [employer] en grasas, sal y azcar.  Use el ndice glucmico (IG) para planificar las comidas. El ndice le informa con qu rapidez un alimento elevar su nivel de azcar en la sangre. Elija alimentos con bajo IG (IG de menos de 55). Estos tardan ms tiempo en subir el nivel de azcar en la sangre. Un nutricionista matriculado puede ayudarlo a identificar los alimentos que son ms bajos en la escala del IG. Estilo de vida  Tal vez deba seguir Web designer. Esta dieta incluye gran cantidad de verduras, carnes magras o pescado, cereales integrales y frutas, as como aceites y grasas saludables. Qu alimentos puedo comer? Lambert Mody Bananas. Manzanas. Naranjas. Uvas. Papaya. Mango. [place]. Kiwi. Pomelo. Cerezas. [place] Valeda Malm. Espinaca. Guisantes. Remolachas. Coliflor. Repollo. Brcoli. Zanahorias. Tomates. [place]. Augustin Coupe. Hierbas. Pimientos. Cebollas. Pepinos. Coles de Bruselas. ames y batatas. Frijoles. Lentejas. Granos Alimentos con trigo integral u otros granos integrales, como panes, Administrator, cereales y pasta. Salvado molido. Avena sin azcar. Trigo burgol. Cebada. Quinua. Arroz integral o salvaje. Tortillas de harina de maz o trigo integral. Carnes y otras protenas Carnes magras. Aves. Tofu. Frutos de mar y Occupational hygienist. Lcteos Productos lcteos descremados o semidescremados, como yogur, queso cottage o  [place]. [employer]. Bebidas sin azcar. T. Caf. Leche descremada o semidescremada. Productos alternativos a la [place], como [place] de soja o de [place]. Jugo de frutas naturales. Grasas y Medical laboratory scientific officer. Aceite de canola o de oliva. Frutos secos y Engineer, mining de frutos secos. Semillas. Alios y condimentos [place]. Salsa de pepinillos. Salsa barbacoa y ktchup con bajo contenido de grasa y de Location manager. [place] con bajo contenido de grasa o sin grasa. Dulces y postres Dulces sin azcar. Es posible que los productos que se enumeran ms [place] no constituyan una lista completa de los alimentos y las bebidas que puede tomar. Consulte a un nutricionista para obtener ms informacin.   Qu alimentos debo limitar o evitar? Carnes y otras protenas Limite la carne roja a 1 o 2  veces por semana. Lcteos Lcteos enteros. Grasas y aceites Aceite de palma y de coco. Comidas fritas. Otros alimentos Alimentos procesados. Alimentos que contengan mucha sal o sodio. Dulces y postres Dulces que Cabin crew. [address], como t [place], [place], bebidas dulces heladas y [place]. El alcohol. Es posible que los productos que se enumeran ms [place] no constituyan una lista completa de los alimentos y las bebidas que Nurse, adult. Consulte a un nutricionista para obtener ms informacin. Dnde buscar ms informacin [employer] of Diabetes and Digestive and Kidney Diseases ([place] Diabetes y las Enfermedades Digestivas y Renales): [employer].is Resumen  La enfermedad heptica por hgado graso no alcohlico es una afeccin que hace que se acumule grasa dentro y alrededor del hgado.  Seguir una dieta saludable puede ayudar a Theatre manager bajo control la enfermedad heptica por hgado graso no alcohlico. Su dieta debe ser rica en frutas, verduras, granos enteros y Advertising account planner.  Limite el consumo de grasas saturadas. Las grasas saturadas se encuentran  en los alimentos que provienen de [place]; entre ellos, la carne y los productos lcteos, como la [place], [place] queso y la [place].  Esta dieta promueve la prdida de [place], [place] a Chief Technology Officer los niveles de Dispensing optician y a Engineer, manufacturing systems forma en que el organismo utiliza la [place]. Esta informacin no tiene Marine scientist el consejo del mdico. Asegrese de hacerle al mdico cualquier pregunta que tenga. Document Revised: 01/10/2019 Document Reviewed: 01/10/2019 Elsevier Patient Education  2021 [place] Cirrhosis  La cirrosis es una lesin de largo plazo (crnica) en el hgado. El hgado es el rgano interno ms grande del cuerpo, y cumple muchas funciones. Este rgano transforma los 3M Company, elimina las sustancias txicas de la [place], [place] protenas importantes y absorbe las vitaminas necesarias de los alimentos. En la cirrosis, las clulas hepticas son reemplazadas por tejido cicatricial. Esto impide que la sangre circule por el hgado y dificulta que el hgado realice sus funciones. Cules son las causas? Las causas ms comunes de la cirrosis son la hepatitisC y el consumo prolongado de alcohol. Otras causas son las siguientes:  Enfermedad por hgado graso no alcohlico [facility]). Esto sucede cuando la grasa se deposita en el hgado por otras causas que no sean el alcohol.  Infeccin por hepatitisB.  Hepatitis autoinmune. En esta enfermedad, el sistema de defensa del cuerpo (sistema inmunitario) ataca por error las clulas del hgado, lo que causa inflamacin.  Enfermedades que [employer] conductos dentro del hgado.  Enfermedades hepticas hereditarias, como la hemocromatosis. Esta es una de las enfermedades hepticas hereditarias ms comunes. En esta enfermedad, depsitos de hierro se acumulan en el hgado y otros rganos.  Reacciones a determinados medicamentos a Barrister's clerk, como la [place], un medicamento para Health visitor.  Infecciones por parsitos. Estas incluyen la esquistosomiasis, que es una enfermedad causada por un platelminto.  Contacto prolongado con ciertas sustancias txicas. Estas sustancias txicas incluyen ciertos solventes orgnicos, tales como cloroformo y tolueno. Qu incrementa el riesgo? Es ms probable que sufra esta afeccin si:  Tiene ciertos tipos de hepatitis viral.  Consume alcohol en exceso, especialmente si es mujer.  Tiene sobrepeso.  [place] drogas intravenosas y comparte agujas.  Tiene relaciones sexuales sin usar proteccin con alguien que tiene una hepatitis viral. Cules son los signos o sntomas? Es posible que no tenga signos ni sntomas al principio. Puede que los sntomas no se manifiesten hasta que el dao heptico empiece a  empeorar. Entre los primeros sntomas, se pueden incluir los siguientes:  Debilidad y International aid/development worker (fatiga).  Cambios en los patrones de sueo o dificultad para dormir.  Picazn.  Dolor a Doctor, hospital parte superior derecha del abdomen.  Adelgazamiento y prdida de masa muscular.  Nuseas.  Prdida del apetito. Entre los sntomas que aparecen en una etapa posterior se pueden incluir los siguientes:  Fatiga o debilidad que [place].  Piel y ojos amarillos (ictericia).  Acumulacin de lquido en el abdomen (ascitis). Puede notar que la ropa se le aprieta alrededor de la cintura.  Aumento de peso e hinchazn de los pies y los tobillos (edema).  Dificultad para respirar.  Tener hematomas o hemorragias.  Vomitar sangre o tener heces negras o con sangre.  Confusin mental. Cmo se diagnostica? El mdico puede sospechar la presencia de cirrosis en funcin de los sntomas y la historia clnica, en especial si tiene otras enfermedades o antecedentes de consumo de alcohol. El [employer] har un examen fsico para palparle el hgado y buscar signos de cirrosis. Las pruebas pueden incluir las siguientes:  Anlisis de sangre  para controlar lo siguiente: ? Si tiene hepatitis B o C. ? La funcin renal. ? La funcin heptica.  Pruebas de diagnstico por imgenes, por ejemplo: ? Health visitor (RM) o exploracin por tomografa computarizada (TC) para buscar los cambios que se observan en los casos de cirrosis [place]. ? Ecografa para determinar si el tejido heptico normal est siendo reemplazado por tejido cicatricial.  Un procedimiento en el que se [place] una aguja larga para tomar Truddie Coco de tejido heptico para estudiarlo en un laboratorio (biopsia). La biopsia de hgado puede confirmar el diagnstico de cirrosis. Cmo se trata? El tratamiento de esta afeccin depende de la magnitud del dao heptico y qu lo caus. Puede incluir el tratamiento de los sntomas de la cirrosis o de las causas subyacentes, a fin de Lexicographer el avance del [place]. El tratamiento puede incluir:  Cambios de [place] de vida, como: ? Seguir una dieta saludable. Puede tener que consultar al mdico o a un nutricionista para Animal nutritionist de alimentacin. ? Restringir la ingesta de sal. ? Mantener un peso saludable. ? No consumir drogas o alcohol.  Tomar medicamentos para: ? Risk manager las infecciones del hgado u otras infecciones. ? Controlar la picazn. ? Disminuir la acumulacin de lquido. ? Reducir ciertas sustancias txicas de la sangre. ? Reducir el riesgo de hemorragia de los vasos sanguneos agrandados en el estmago o el esfago (vrices).  Trasplante de hgado. En este procedimiento, el hgado de un donante se [place] para reemplazar el hgado enfermo. Esto se realiza si la cirrosis ha ocasionado insuficiencia heptica. Otros tratamientos y procedimientos, segn los problemas que usted tenga debido a la cirrosis. Los problemas frecuentes incluyen insuficiencia renal relacionada con el hgado (sndrome hepatorenal). Siga estas instrucciones en su casa:  Tome los medicamentos solamente como se lo haya indicado el  mdico. No use medicamentos que sean txicos para el hgado. Consulte al mdico antes de tomar algn [employer], incluidos los de venta libre, [place] los antiinflamatorios no esteroideos [place]).  Descanse todo lo que sea necesario.  Siga una alimentacin bien equilibrada.  Limite el consumo de sal o de agua, si el mdico le indica que lo haga.  No beba alcohol. Esto es especialmente importante si toma paracetamol de manera habitual.  Cumpla con todas las visitas de seguimiento. Esto es importante.   Comunquese con un mdico si:  Tiene fatiga  o debilidad que empeora.  Se le hinchan las manos, los pies o las piernas, o tiene acumulacin de lquido en el abdomen (ascitis).  Tiene fiebre o escalofros.  Pierde el apetito.  Siente nuseas o vmitos.  Tiene ictericia.  Se le forman hematomas o sangra con facilidad. Solicite ayuda de inmediato si:  Vomita sangre de color rojo brillante o una sustancia parecida a los granos de caf.  Tiene [employer].  Nota que las heces son de color negro y de aspecto alquitranado.  Comienza a sentirse confundido.  Tiene dolor en el pecho o dificultad para respirar. Estos sntomas pueden representar un problema grave que constituye Engineer, maintenance (IT). No espere a ver si los sntomas desaparecen. Solicite atencin mdica de inmediato. Comunquese con el servicio de emergencias de su localidad (911 en los [place]). No conduzca por sus propios medios [employer]. Resumen  La cirrosis es una lesin crnica en el hgado. Las causas ms comunes son la hepatitisC y el consumo prolongado de alcohol.  Las pruebas para diagnosticar la cirrosis incluyen anlisis de [place], estudios de diagnstico por imgenes y biopsia del hgado.  El tratamiento de la afeccin incluye el tratamiento de la afeccin preexistente. Evite el consumo de alcohol, drogas, sal y los medicamentos que pueden daar el hgado.  Busque ayuda de inmediato si  vomita sangre de color rojo brillante o una sustancia parecida a los granos de caf. Esta informacin no tiene Marine scientist el consejo del mdico. Asegrese de hacerle al mdico cualquier pregunta que tenga. Document Revised: 09/15/2020 Document Reviewed: 09/15/2020 Elsevier Patient Education  2021 [employer].

## 2021-03-01 NOTE — Discharge Summary (Signed)
Physician Discharge Summary  KWESI SANGHA BWL:[id number] DOB: 1966-08-22 DOA: 02/24/2021  PCP: Maximiano Coss, NP  Admit date: 02/24/2021 Discharge date: 03/01/2021  Admitted From: Home Disposition: Home  Recommendations for Outpatient Follow-up:  1. Follow up with PCP in 1-2 weeks 2. Follow-up with [facility], Dr. Modena Nunnery scheduled on 03/04/2021 3. Discussed need to continue in compliance with lactulose and rifaxamin. 4. Discontinue Coumadin given bleeding episode and severe thrombocytopenia in the setting of cirrhosis.  Repeat right lower extremity ultrasound negative for DVT now. 5. Recommend CMP 1 week  Home Health: PT Equipment/Devices: Walker  Discharge Condition: Stable CODE STATUS: Full code Diet recommendation: Heart healthy/low-salt diet  History of present illness:  Ian Hall is a 55 year old male with past medical history significant for Karlene Lineman cirrhosis, lower extremity DVT on anticoagulation with Coumadin, anxiety, incidental Covid-19 infection (positive 02/06/21) who presented to [facility] on 3/29 via EMS with altered mental status with concern for seizure-like activity.  Wife reported on admission she saw him early in the morning with decreased mentation and possible seizure-like activity and bleeding from his mouth.  Upon EMS arrival, patient remained largely unresponsive and had an episode of hematemesis in route to the ED.  On arrival he had snoring respirations on presentation abnormal motor movements of the right lower extremity concerning for seizure.  He was subsequently intubated for airway protection and was later found to have a tongue laceration.  And patient was admitted to the [facility] service for further evaluation and management.  Upon evaluation, it was noted that he was encephalopathic secondary to hepatic encephalopathy and decompensated Karlene Lineman cirrhosis due to likely medication noncompliance.  Patient was successfully extubated on 02/26/2021 with  improvement of mental status.  Patient was transferred to [facility] on 02/28/2021.  Hospital course:  Acute respiratory failure in setting of acute metabolic encephalopathy, POA Upon presentation, patient was noted to be obtunded and unable to protect his airway likely secondary to hepatic encephalopathy.  He was subsequently intubated and managed on the critical care service.  He was successfully extubated on 02/26/2021.  He remains alert and oriented now and oxygenating well on room air.  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  Recent meld score 25, failed trial of steroids at time of diagnosis.  Home medications include furosemide and Aldactone.  Recently underwent upper/lower endoscopies with gastroenterology on 12/30/2020 with no gross abnormalities or varices identified.  Has been referred [facility] but unable to make it due to transportation issues.  Acute hepatitis panel negative on admission.  Patient was seen by gastroenterology during the admission.  Completed 5-day course of ceftriaxone inpatient.  Continue lactulose 30 g 3 times daily (goal 2-3 soft BM/day), Rifaximin 550 mg PO BID, Spironolactone 163m PO daily, Furosemide 458mPO daily. Patient has follow-up scheduled with [facility], Dr. BeTarri Glennn 03/04/21.  Acute hepatic encephalopathy Questionable seizure Ammonia level on admission 234.  CT head with no acute intracranial abnormality.  EEG with diffuse slowing of the background activity (on sedation).  Ammonia level improved to 32 with treatment with lactulose.  We will continue Keppra 500 mg p.o. twice daily x7 days per PCCM.  Upper GI bleed versus tongue laceration Coagulopathy/thrombocytopenia Supratherapeutic INR Patient presenting with bright red blood within oral cavity with episode of "hematemesis" per EMS.  INR 7.9 on admission, on Coumadin at home for history of right DVT.  Patient received vitamin K and Kcentra on  arrival.  Repeat vascular duplex right  lower extremity ultrasound negative for DVT.  Given his thrombocytopenia and supratherapeutic INR, Coumadin was discontinued and given resolution of DVT unlikely to need further anticoagulation at this point as risks greater than benefit.  Hyponatremia Hypomagnesemia Repleted during hospitalization.  Recommend repeat CMP 1 week.  Right lower extremity VTE anticoagulated with Coumadin Initially diagnosed 02/08/2021, started on Coumadin.  INR elevated 7.9 on admission which was reversed with vitamin K and Kcentra.  Repeat lower extremity duplex ultrasound negative for DVT.  Will not restart Coumadin as DVT now resolved and risks outweigh benefits given his underlying cirrhosis and thrombocytopenia.  Discharge Diagnoses:  Active Problems:   Hyponatremia   Alcoholic cirrhosis of liver without ascites [facility])    Discharge Instructions  Discharge Instructions    Call MD for:  difficulty breathing, headache or visual disturbances   Complete by: As directed    Call MD for:  extreme fatigue   Complete by: As directed    Call MD for:  persistant dizziness or light-headedness   Complete by: As directed    Call MD for:  persistant nausea and vomiting   Complete by: As directed    Call MD for:  severe uncontrolled pain   Complete by: As directed    Call MD for:  temperature >100.4   Complete by: As directed    Diet - low sodium heart healthy   Complete by: As directed    Increase activity slowly   Complete by: As directed      Allergies as of 03/01/2021      Reactions   Hydrocodone Itching, Other (See Comments)   Noted with hydrocodone cough syrup. Itching only, no hives.  02/03/16: denied itching with recent hydrocodone cough syrup. Only one episode of itching previously.       Medication List    STOP taking these medications   lactulose 10 g packet Commonly known as: CEPHULAC Replaced by: lactulose 10 GM/15ML solution   warfarin 2 MG  tablet Commonly known as: COUMADIN     TAKE these medications   dicyclomine 10 MG capsule Commonly known as: BENTYL Take 1 capsule (10 mg total) by mouth 4 (four) times daily -  before meals and at bedtime.   folic acid 1 MG tablet Commonly known as: FOLVITE Take 1 tablet (1 mg total) by mouth daily.   furosemide 40 MG tablet Commonly known as: Lasix Take 1 tablet (40 mg total) by mouth daily.   hydrOXYzine 10 MG tablet Commonly known as: ATARAX/VISTARIL Take 0.5-1 tablets (5-10 mg total) by mouth at bedtime as needed for itching.   lactulose 10 GM/15ML solution Commonly known as: CHRONULAC Take 45 mLs (30 g total) by mouth 3 (three) times daily. Replaces: lactulose 10 g packet   levETIRAcetam 500 MG tablet Commonly known as: KEPPRA Take 1 tablet (500 mg total) by mouth 2 (two) times daily for 2 days.   pantoprazole 40 MG tablet Commonly known as: PROTONIX Take 1 tablet (40 mg total) by mouth 2 (two) times daily.   rifaximin 550 MG Tabs tablet Commonly known as: XIFAXAN Take 1 tablet (550 mg total) by mouth 2 (two) times daily.   spironolactone 100 MG tablet Commonly known as: Aldactone Take 1 tablet (100 mg total) by mouth daily.   thiamine 100 MG tablet Take 1 tablet (100 mg total) by mouth daily.            Durable Medical Equipment  (From admission, onward)  Start     Ordered   02/28/21 [phone]  For home use only DME Walker rolling  Once       Question Answer Comment  Walker: With [place] Wheels   Patient needs a walker to treat with the following condition Gait abnormality      02/28/21 1950          Follow-up Information    Maximiano Coss, NP. Schedule an appointment as soon as possible for a visit in 1 week(s).   Specialty: Adult Health Nurse Practitioner Contact information: [place] [address] [phone]        Thornton Park, MD. Go on 03/04/2021.   Specialty: Gastroenterology Contact information: [place] [address] [phone]              Allergies  Allergen Reactions  . Hydrocodone Itching and Other (See Comments)    Noted with hydrocodone cough syrup. Itching only, no hives.  02/03/16: denied itching with recent hydrocodone cough syrup. Only one episode of itching previously.     Consultations: PCCM [facility]  Procedures/Studies: DG Chest 1 View  Result Date: 02/06/2021 CLINICAL DATA:  Dyspnea. EXAM: CHEST  1 VIEW COMPARISON:  12/26/2020 FINDINGS: Lower lung volumes from prior exam with mild bronchovascular crowding. Stable heart size and mediastinal contours. Streaky opacities at the left lung base favor atelectasis. Hazy opacity in the right costophrenic angle likely represents small effusion. No pneumothorax. Overlying monitoring devices project over the right chest. IMPRESSION: 1. Lower lung volumes from prior exam with bronchovascular crowding. 2. Hazy opacity in the right costophrenic angle likely small effusion. 3. Streaky left basilar opacities favor atelectasis. Electronically Signed   By: Keith Rake M.D.   On: 02/06/2021 17:43   CT HEAD WO CONTRAST  Result Date: 02/24/2021 CLINICAL DATA:  Seizure EXAM: CT HEAD WITHOUT CONTRAST TECHNIQUE: Contiguous axial images were obtained from the base of the skull through the vertex without intravenous contrast. COMPARISON:  None. FINDINGS: Brain: Normal anatomic configuration. No abnormal intra or extra-axial mass lesion or fluid collection. No abnormal mass effect or midline shift. No evidence of acute intracranial hemorrhage or infarct. Ventricular size is normal. Cerebellum unremarkable. Vascular: Unremarkable Skull: Intact Sinuses/Orbits: Paranasal sinuses are clear. Orbits are unremarkable. Other: Mastoid air cells and middle ear cavities are clear. IMPRESSION: No acute intracranial abnormality. Electronically Signed   By: Fidela Salisbury MD   On: 02/24/2021 06:24   US Paracentesis  Result Date:  02/07/2021 INDICATION: History of cirrhosis. Recurrent ascites. Request for diagnostic and therapeutic paracentesis. EXAM: ULTRASOUND GUIDED RIGHT LOWER QUADRANT PARACENTESIS MEDICATIONS: None. COMPLICATIONS: None immediate. PROCEDURE: Informed written consent was obtained from the patient after a discussion of the risks, benefits and alternatives to treatment. A timeout was performed prior to the initiation of the procedure. Initial ultrasound scanning demonstrates a large amount of ascites within the right lower abdominal quadrant. The right lower abdomen was prepped and draped in the usual sterile fashion. 1% lidocaine was used for local anesthesia. Following this, a 19 gauge, 7-cm, Yueh catheter was introduced. An ultrasound image was saved for documentation purposes. The paracentesis was performed. The catheter was removed and a dressing was applied. The patient tolerated the procedure well without immediate post procedural complication. FINDINGS: A total of approximately 2.6 L of clear yellow fluid was removed. Samples were sent to the laboratory as requested by the clinical team. IMPRESSION: Successful ultrasound-guided paracentesis yielding 2.6 liters of peritoneal fluid. Read by: Ascencion Dike PA-C  Electronically Signed   By: Jacqulynn Cadet M.D.   On: 02/07/2021 10:00   DG CHEST PORT 1 VIEW  Result Date: 02/26/2021 CLINICAL DATA:  Respiratory failure. EXAM: PORTABLE CHEST 1 VIEW COMPARISON:  02/24/2021. FINDINGS: NG tube has been repositioned, its tip is in the stomach. Endotracheal tube in stable position. Heart size normal. Low lung volumes with bibasilar atelectasis. Left base infiltrate cannot be excluded. Tiny bilateral pleural effusions cannot be excluded. No pneumothorax. IMPRESSION: 1. NG tube is been repositioned, its tip is in the stomach. Endotracheal tube in stable position. 2. Low lung volumes with bibasilar atelectasis. Left base infiltrate cannot be excluded. Tiny bilateral pleural  effusions can not be excluded. Electronically Signed   By: Marcello Moores  Register   On: 02/26/2021 05:33   DG Chest Port 1 View  Result Date: 02/24/2021 CLINICAL DATA:  Hypoxia EXAM: PORTABLE CHEST 1 VIEW COMPARISON:  February 24, 2021 study obtained earlier in the day FINDINGS: Endotracheal tube tip is 3.2 cm above the carina. Nasogastric tube coils in the upper esophagus with tube extending back into the patient's pharynx. The tip of the nasogastric tube is to the right of the endotracheal tube at the C5 level. No appreciable central catheter. No edema or airspace opacity. Heart size and pulmonary vascular normal. No adenopathy. No bone lesions. IMPRESSION: Tube positions as described. Note that the nasogastric tube tip is in the cervical region at the level of C5 after coiling in the upper thoracic esophagus and extending back into the pharynx region. No central catheter seen. Lungs clear.  Cardiac silhouette normal. These results were called by telephone at the time of interpretation on 02/24/2021 at 9:00 am to provider Domenic Moras, who verbally acknowledged these results. Electronically Signed   By: Lowella Grip III M.D.   On: 02/24/2021 09:01   DG Chest Port 1 View  Result Date: 02/24/2021 CLINICAL DATA:  Altered mental status, hemoptysis EXAM: PORTABLE CHEST 1 VIEW COMPARISON:  02/06/2021 FINDINGS: Lung volumes are small, but are symmetric. Patchy infiltrate is developed within the left suprahilar region, possibly infectious in the acute setting. Mild left basilar atelectasis or infiltrate. No pneumothorax or pleural effusion. Cardiac size within normal limits. IMPRESSION: Pulmonary hypoinflation. Superimposed patchy left suprahilar pulmonary infiltrate may be infectious in etiology. Electronically Signed   By: Fidela Salisbury MD   On: 02/24/2021 06:05   EEG adult  Result Date: 02/24/2021 Serita Grammes, MD     02/24/2021  1:56 PM HISTORY The patient is a 55 year old man with a history of NASH cirrhosis who  is being evaluated for AMS and seizure-like activity requiring intubation in the setting of hyperammonemia.  TECHNICAL This study was performed according to the 10-20 International System of Electrode Placement. This record was reviewed under the following settings: bandpass filters of 1-70 Hz, sensitivity of 7 uV/mm, a display speed of 30 mm/sec, with a 60 Hz notched filter applied as appropriate.  MEDICATIONS Fentanyl gtt, Keppra  FINDINGS This study was recorded in the comatose state. A diffuse low amplitude dominant background rhythm of 3-4  Hz is seen. No focal, lateralizating, or epileptiform discharges are seen.  HYPERVENTILATION: Not performed PHOTIC STIMULATION: Not performed  IMPRESSION This is an abnormal study recorded in the comatose state. Diffuse slowing of the background activity reflects a severe degree of diffuse cortical dysfunction, which can be seen with toxic-metabolic or systemic causes. No focal, lateralizating, or epileptiform discharges are seen.   ECHOCARDIOGRAM COMPLETE  Result Date: 02/24/2021  ECHOCARDIOGRAM REPORT   Patient Name:   SOMA LIZAK Date of Exam: 02/24/2021 Medical Rec #:  [id number]     Height:       65.0 in Accession #:    [id number]    Weight:       205.9 lb Date of Birth:  1966/05/08     BSA:          2.003 m Patient Age:    88 years      BP:           116/67 mmHg Patient Gender: M             HR:           65 bpm. Exam Location:  Inpatient Procedure: 2D Echo, 3D Echo, Cardiac Doppler and Color Doppler Indications:    R94.31 Abnormal EKG  History:        Patient has no prior history of Echocardiogram examinations.                 Signs/Symptoms:Dyspnea, Shortness of Breath and Altered Mental                 Status. ETOH. Covid infection 2 weeks ago.  Sonographer:    Roseanna Rainbow RDCS Referring Phys: [phone] [facility] F DAVIS  Sonographer Comments: Echo performed with patient supine and on artificial respirator. Image acquisition challenging due to respiratory motion.  IMPRESSIONS  1. Left ventricular ejection fraction, by estimation, is 60 to 65%. The left ventricle has normal function. The left ventricle has no regional wall motion abnormalities. There is mild concentric left ventricular hypertrophy. Left ventricular diastolic parameters are consistent with Grade I diastolic dysfunction (impaired relaxation).  2. Right ventricular systolic function is normal. The right ventricular size is normal.  3. The mitral valve is normal in structure. Trivial mitral valve regurgitation. No evidence of mitral stenosis.  4. The aortic valve is tricuspid. Aortic valve regurgitation is not visualized. No aortic stenosis is present. FINDINGS  Left Ventricle: Left ventricular ejection fraction, by estimation, is 60 to 65%. The left ventricle has normal function. The left ventricle has no regional wall motion abnormalities. The left ventricular internal cavity size was normal in size. There is  mild concentric left ventricular hypertrophy. Left ventricular diastolic parameters are consistent with Grade I diastolic dysfunction (impaired relaxation). Indeterminate filling pressures. Right Ventricle: The right ventricular size is normal. No increase in right ventricular wall thickness. Right ventricular systolic function is normal. Left Atrium: Left atrial size was normal in size. Right Atrium: Right atrial size was normal in size. Pericardium: There is no evidence of pericardial effusion. Mitral Valve: The mitral valve is normal in structure. Trivial mitral valve regurgitation. No evidence of mitral valve stenosis. Tricuspid Valve: The tricuspid valve is normal in structure. Tricuspid valve regurgitation is mild . No evidence of tricuspid stenosis. Aortic Valve: The aortic valve is tricuspid. Aortic valve regurgitation is not visualized. No aortic stenosis is present. Pulmonic Valve: The pulmonic valve was normal in structure. Pulmonic valve regurgitation is not visualized. No evidence of pulmonic  stenosis. Aorta: The aortic root is normal in size and structure. Venous: The inferior vena cava was not well visualized. IAS/Shunts: No atrial level shunt detected by color flow Doppler. Additional Comments: A device lead is visualized.  LEFT VENTRICLE PLAX 2D LVIDd:         4.30 cm     Diastology LVIDs:         2.60 cm  LV e' medial:    7.40 cm/s LV PW:         1.50 cm     LV E/e' medial:  11.5 LV IVS:        1.20 cm     LV e' lateral:   9.03 cm/s LVOT diam:     2.00 cm     LV E/e' lateral: 9.4 LV SV:         78 LV SV Index:   39 LVOT Area:     3.14 cm  LV Volumes (MOD) LV vol d, MOD A2C: 98.2 ml LV vol d, MOD A4C: 74.9 ml LV vol s, MOD A2C: 24.0 ml LV vol s, MOD A4C: 20.9 ml LV SV MOD A2C:     74.2 ml LV SV MOD A4C:     74.9 ml LV SV MOD BP:      61.3 ml RIGHT VENTRICLE RV S prime:     11.90 cm/s TAPSE (M-mode): 2.0 cm LEFT ATRIUM             Index       RIGHT ATRIUM           Index LA diam:        3.90 cm 1.95 cm/m  RA Area:     10.70 cm LA Vol (A2C):   31.2 ml 15.58 ml/m RA Volume:   17.80 ml  8.89 ml/m LA Vol (A4C):   37.2 ml 18.57 ml/m LA Biplane Vol: 34.3 ml 17.13 ml/m  AORTIC VALVE LVOT Vmax:   106.00 cm/s LVOT Vmean:  81.700 cm/s LVOT VTI:    0.249 m  AORTA Ao Root diam: 3.40 cm Ao Asc diam:  3.40 cm MITRAL VALVE               TRICUSPID VALVE MV Area (PHT): 2.99 cm    TR Peak grad:   13.0 mmHg MV Decel Time: 254 msec    TR Vmax:        180.00 cm/s MV E velocity: 85.30 cm/s MV A velocity: 98.10 cm/s  SHUNTS MV E/A ratio:  0.87        Systemic VTI:  0.25 m                            Systemic Diam: 2.00 cm Skeet Latch MD Electronically signed by Skeet Latch MD Signature Date/Time: 02/24/2021/5:26:05 PM    Final    VAS [place] LOWER EXTREMITY VENOUS (DVT)  Result Date: 02/27/2021  Lower Venous DVT Study Indications: History of DVT RT.  Comparison Study: 02-08-2021 Lower extremity venous bilateral was postive for                   acute DVT involving the RT PTV. Performing Technologist: Darlin Coco RDMS,RVT  Examination Guidelines: A complete evaluation includes B-mode imaging, spectral Doppler, color Doppler, and power Doppler as needed of all accessible portions of each vessel. Bilateral testing is considered an integral part of a complete examination. Limited examinations for reoccurring indications may be performed as noted. The reflux portion of the exam is performed with the patient in reverse Trendelenburg.  +---------+---------------+---------+-----------+----------+--------------+ RIGHT    CompressibilityPhasicitySpontaneityPropertiesThrombus Aging +---------+---------------+---------+-----------+----------+--------------+ CFV      Full           Yes      Yes                                 +---------+---------------+---------+-----------+----------+--------------+  SFJ      Full                                                        +---------+---------------+---------+-----------+----------+--------------+ FV Prox  Full                                                        +---------+---------------+---------+-----------+----------+--------------+ FV Mid   Full                                                        +---------+---------------+---------+-----------+----------+--------------+ FV DistalFull                                                        +---------+---------------+---------+-----------+----------+--------------+ PFV      Full                                                        +---------+---------------+---------+-----------+----------+--------------+ POP      Full           Yes      Yes                                 +---------+---------------+---------+-----------+----------+--------------+ PTV      Full                                                        +---------+---------------+---------+-----------+----------+--------------+ PERO     Full                                                         +---------+---------------+---------+-----------+----------+--------------+   +----+---------------+---------+-----------+----------+--------------+ LEFTCompressibilityPhasicitySpontaneityPropertiesThrombus Aging +----+---------------+---------+-----------+----------+--------------+ CFV Full           Yes      Yes                                 +----+---------------+---------+-----------+----------+--------------+     Summary: RIGHT: - Findings appear improved from previous examination. - There is no evidence of deep vein thrombosis in the lower extremity.  - No cystic structure found in the popliteal fossa.  LEFT: - No evidence of common femoral vein obstruction.  *See table(s) above for measurements and observations.  Electronically signed by Monica Martinez MD on 02/27/2021 at 4:57:10 PM.    Final    VAS [place] LOWER EXTREMITY VENOUS (DVT)  Result Date: 02/08/2021  Lower Venous DVT Study Other Indications: Covid. Risk Factors: None identified. Comparison Study: No previous Performing Technologist: Vonzell Schlatter RVT  Examination Guidelines: A complete evaluation includes B-mode imaging, spectral Doppler, color Doppler, and power Doppler as needed of all accessible portions of each vessel. Bilateral testing is considered an integral part of a complete examination. Limited examinations for reoccurring indications may be performed as noted. The reflux portion of the exam is performed with the patient in reverse Trendelenburg.  +---------+---------------+---------+-----------+----------+-----------------+ RIGHT    CompressibilityPhasicitySpontaneityPropertiesThrombus Aging    +---------+---------------+---------+-----------+----------+-----------------+ CFV      Full           Yes      Yes                                    +---------+---------------+---------+-----------+----------+-----------------+ SFJ      Full                                                            +---------+---------------+---------+-----------+----------+-----------------+ FV Prox  Full                                                           +---------+---------------+---------+-----------+----------+-----------------+ FV Mid   Full                                                           +---------+---------------+---------+-----------+----------+-----------------+ FV DistalFull                                                           +---------+---------------+---------+-----------+----------+-----------------+ PFV      Full                                                           +---------+---------------+---------+-----------+----------+-----------------+ POP      Full           Yes      Yes                                    +---------+---------------+---------+-----------+----------+-----------------+ PTV      Partial  Age Indeterminate +---------+---------------+---------+-----------+----------+-----------------+ PERO     Full                                                           +---------+---------------+---------+-----------+----------+-----------------+   +---------+---------------+---------+-----------+----------+--------------+ LEFT     CompressibilityPhasicitySpontaneityPropertiesThrombus Aging +---------+---------------+---------+-----------+----------+--------------+ CFV      Full           Yes      Yes                                 +---------+---------------+---------+-----------+----------+--------------+ SFJ      Full                                                        +---------+---------------+---------+-----------+----------+--------------+ FV Prox  Full                                                        +---------+---------------+---------+-----------+----------+--------------+ FV Mid   Full                                                         +---------+---------------+---------+-----------+----------+--------------+ FV DistalFull                                                        +---------+---------------+---------+-----------+----------+--------------+ PFV      Full                                                        +---------+---------------+---------+-----------+----------+--------------+ POP      Full           Yes      Yes                                 +---------+---------------+---------+-----------+----------+--------------+ PTV      Full                                                        +---------+---------------+---------+-----------+----------+--------------+ PERO     Full                                                        +---------+---------------+---------+-----------+----------+--------------+  Summary: RIGHT: - Findings consistent with age indeterminate deep vein thrombosis involving a short segment right posterior tibial vein proximal calf. (one ptv) - No cystic structure found in the popliteal fossa.  LEFT: - There is no evidence of deep vein thrombosis in the lower extremity. - There is no evidence of superficial venous thrombosis.  - No cystic structure found in the popliteal fossa.  *See table(s) above for measurements and observations. Electronically signed by Harold Barban MD on 02/08/2021 at 9:08:22 PM.    Final    US Abdomen Limited RUQ (LIVER/GB)  Result Date: 02/24/2021 CLINICAL DATA:  Cirrhosis EXAM: ULTRASOUND ABDOMEN LIMITED RIGHT UPPER QUADRANT COMPARISON:  01/07/2021 FINDINGS: Gallbladder: Not visualized. Common bile duct: Diameter: Not visualized. Liver: Limited visualization of the liver which appears diffusely echogenic. Portal vein not visualized. Other: None. IMPRESSION: Very limited ultrasound evaluation of the right upper quadrant. Nonvisualization of the gallbladder, common bile duct, and portal vein. Limited visualization of the liver which appears  diffusely echogenic. Unable to reliably exclude hepatic lesions. Electronically Signed   By: Miachel Roux M.D.   On: 02/24/2021 11:16   IR Paracentesis  Result Date: 01/30/2021 INDICATION: Patient with history of alcoholic cirrhosis, recurrent ascites. Request is made for diagnostic and therapeutic paracentesis. EXAM: ULTRASOUND GUIDED  PARACENTESIS MEDICATIONS: 10 mL 1 % lidocaine COMPLICATIONS: None immediate. PROCEDURE: Informed written consent was obtained from the patient after a discussion of the risks, benefits and alternatives to treatment. A timeout was performed prior to the initiation of the procedure. Initial ultrasound scanning demonstrates a large amount of ascites within the right lower abdominal quadrant. The right lower abdomen was prepped and draped in the usual sterile fashion. 1% lidocaine was used for local anesthesia. Following this, a 19 gauge, 7-cm, Yueh catheter was introduced. An ultrasound image was saved for documentation purposes. The paracentesis was performed. The catheter was removed and a dressing was applied. The patient tolerated the procedure well without immediate post procedural complication. FINDINGS: A total of approximately 2.4 L of clear yellow fluid was removed. IMPRESSION: Successful ultrasound-guided paracentesis yielding 2.4 liters of peritoneal fluid. Read by: Durenda Guthrie, PA-C Electronically Signed   By: Lucrezia Europe M.D.   On: 01/30/2021 14:14      Subjective: Patient seen and examined at bedside, resting comfortably.  No complaints this morning.  Ready for discharge home.  Understands the importance of compliance with his medications to include lactulose and close follow-up that is scheduled on 03/04/2021 with GI.  No other questions or concerns at this time.  Denies headache, no fever/chills/night sweats, no nausea/vomiting/diarrhea, no chest pain, no palpitations, no shortness of breath, no abdominal pain, no weakness, no fatigue, no paresthesias.  No acute  events overnight per nursing staff.  Discharge Exam: Vitals:   03/01/21 0600 03/01/21 0808  BP: 126/63   Pulse: 95   Resp: 15   Temp:  99.5 F (37.5 C)  SpO2: 94%    Vitals:   03/01/21 0400 03/01/21 0500 03/01/21 0600 03/01/21 0808  BP: 130/69 (!) 104/55 126/63   Pulse: 88 89 95   Resp: 17 15 15    Temp:    99.5 F (37.5 C)  TempSrc:    Oral  SpO2: 96% 96% 94%   Weight:  98.1 kg    Height:        General: Pt is alert, awake, not in acute distress, appears slightly older than stated age Cardiovascular: RRR, S1/S2 +, no rubs, no gallops Respiratory: CTA bilaterally, no wheezing,  no rhonchi Abdominal: Soft, NT, mild distention, bowel sounds + Extremities: no edema, no cyanosis    The results of significant diagnostics from this hospitalization (including imaging, microbiology, ancillary and laboratory) are listed below for reference.     Microbiology: Recent Results (from the past 240 hour(s))  Culture, blood (single) w Reflex to ID Panel     Status: None (Preliminary result)   Collection Time: 02/24/21  5:50 AM   Specimen: BLOOD  Result Value Ref Range Status   Specimen Description   Final    BLOOD RIGHT ANTECUBITAL Performed at [facility] [address] [place], [place] [address]    Special Requests   Final    BOTTLES DRAWN AEROBIC AND ANAEROBIC Blood Culture adequate volume Performed at [facility] [address] [place], [place] [address]    Culture   Final    NO GROWTH 4 DAYS Performed at [facility], [place] [address], [place], [place] [address]    Report Status PENDING  Incomplete  Resp Panel by RT-PCR (Flu A&B, Covid) Nasopharyngeal Swab     Status: None   Collection Time: 02/24/21  8:08 AM   Specimen: Nasopharyngeal Swab; Nasopharyngeal(NP) swabs in vial transport medium  Result Value Ref Range Status   SARS Coronavirus 2 by RT PCR NEGATIVE NEGATIVE Final    Comment: (NOTE) SARS-CoV-2 target nucleic  acids are NOT DETECTED.  The SARS-CoV-2 RNA is generally detectable in upper respiratory specimens during the acute phase of infection. The lowest concentration of SARS-CoV-2 viral copies this assay can detect is 138 copies/mL. A negative result does not preclude SARS-Cov-2 infection and should not be used as the sole basis for treatment or other patient management decisions. A negative result may occur with  improper specimen collection/handling, submission of specimen other than nasopharyngeal swab, presence of viral mutation(s) within the areas targeted by this assay, and inadequate number of viral copies(<138 copies/mL). A negative result must be combined with clinical observations, patient history, and epidemiological information. The expected result is Negative.  Fact Sheet for Patients:  EntrepreneurPulse.com.au  Fact Sheet for Healthcare Providers:  IncredibleEmployment.be  This test is no t yet approved or cleared by the [place] FDA and  has been authorized for detection and/or diagnosis of SARS-CoV-2 by FDA under an Emergency Use Authorization (EUA). This EUA will remain  in effect (meaning this test can be used) for the duration of the COVID-19 declaration under Section 564(b)(1) of the Act, 21 U.S.C.section 360bbb-3(b)(1), unless the authorization is terminated  or revoked sooner.       Influenza A by PCR NEGATIVE NEGATIVE Final   Influenza B by PCR NEGATIVE NEGATIVE Final    Comment: (NOTE) The Xpert Xpress SARS-CoV-2/FLU/RSV plus assay is intended as an aid in the diagnosis of influenza from Nasopharyngeal swab specimens and should not be used as a sole basis for treatment. Nasal washings and aspirates are unacceptable for Xpert Xpress SARS-CoV-2/FLU/RSV testing.  Fact Sheet for Patients: EntrepreneurPulse.com.au  Fact Sheet for Healthcare Providers: IncredibleEmployment.be  This  test is not yet approved or cleared by the [place] FDA and has been authorized for detection and/or diagnosis of SARS-CoV-2 by FDA under an Emergency Use Authorization (EUA). This EUA will remain in effect (meaning this test can be used) for the duration of the COVID-19 declaration under Section 564(b)(1) of the Act, 21 U.S.C. section 360bbb-3(b)(1), unless the authorization is terminated or revoked.  Performed at [facility], [place] [address] [place], [place] [address]   MRSA PCR Screening     Status: None   Collection Time: 02/26/21 10:10 AM   Specimen: Nasal Mucosa; Nasopharyngeal  Result Value Ref Range Status   MRSA by PCR NEGATIVE NEGATIVE Final    Comment:        The GeneXpert MRSA Assay (FDA approved for NASAL specimens only), is one component of a comprehensive MRSA colonization surveillance program. It is not intended to diagnose MRSA infection nor to guide or monitor treatment for MRSA infections. Performed at [facility], [place] [address], [place], [place] [address]      Labs: BNP (last 3 results) No results for input(s): BNP in the last 8760 hours. Basic Metabolic Panel: Recent Labs  Lab 02/25/21 0032 02/25/21 1109 02/25/21 1651 02/26/21 0216 02/26/21 1639 02/27/21 0246 02/28/21 0246 03/01/21 0236  NA 132*  --   --  132*  --  135 132* 129*  K 5.1  --   --  4.7  --  4.3 4.3 4.1  CL 107  --   --  103  --  103 101 98  CO2 17*  --   --  21*  --  25 27 25   GLUCOSE 116*  --   --  151*  --  137* 113* 111*  BUN 31*  --   --  30*  --  27* 21* 21*  CREATININE 1.25*  --   --  1.05  --  0.71 0.72 0.86  CALCIUM 8.5*  --   --  8.2*  --  8.4* 8.3* 8.3*  MG 1.7 1.9 1.9 1.9 1.9  --   --  1.5*  PHOS 4.6 4.4 4.5 4.0 3.3  --   --   --    Liver Function Tests: Recent Labs  Lab 02/24/21 0548 02/26/21 0216 02/28/21 0246 03/01/21 0236  AST 139* 114* 153* 184*  ALT 53* 46* 49* 56*  ALKPHOS 349* 289* 289* 325*  BILITOT  9.0* 8.4* 7.2* 6.3*  PROT 6.6 5.5* 5.5* 5.1*  ALBUMIN 2.9* 2.4* 2.4* 2.3*   No results for input(s): LIPASE, AMYLASE in the last 168 hours. Recent Labs  Lab 02/24/21 0548 02/24/21 1149 02/25/21 0032 02/26/21 0940 02/28/21 0246  AMMONIA 234* 138* 121* 79* 32   CBC: Recent Labs  Lab 02/25/21 0726 02/26/21 0216 02/27/21 0246 02/28/21 0246 03/01/21 0236  WBC 11.6* 8.8 10.1 8.6 7.5  HGB 12.5* 11.8* 12.1* 11.4* 10.9*  HCT 37.5* 35.6* 36.0* 33.8* 31.9*  MCV 102.7* 102.9* 102.9* 101.2* 99.7  PLT 84* 72* 65* 69* 76*   Cardiac Enzymes: No results for input(s): CKTOTAL, CKMB, CKMBINDEX, TROPONINI in the last 168 hours. BNP: Invalid input(s): POCBNP CBG: Recent Labs  Lab 02/28/21 1611 02/28/21 1949 02/28/21 2352 03/01/21 0421 03/01/21 0727  GLUCAP 112* 107* 118* 120* 95   D-Dimer No results for input(s): DDIMER in the last 72 hours. Hgb A1c No results for input(s): HGBA1C in the last 72 hours. Lipid Profile No results for input(s): CHOL, HDL, LDLCALC, TRIG, CHOLHDL, LDLDIRECT in the last 72 hours. Thyroid function studies No results for input(s): TSH, T4TOTAL, T3FREE, THYROIDAB in the last 72 hours.  Invalid input(s): FREET3 Anemia work up No results for input(s): VITAMINB12, FOLATE, FERRITIN, TIBC, IRON, RETICCTPCT in the last 72 hours. Urinalysis    Component Value Date/Time   COLORURINE AMBER (A) 02/24/2021 1219   APPEARANCEUR CLEAR 02/24/2021 1219   APPEARANCEUR Cloudy (A) 01/14/2021 1630   LABSPEC 1.024 02/24/2021 1219  PHURINE 5.0 02/24/2021 1219   GLUCOSEU NEGATIVE 02/24/2021 1219   HGBUR SMALL (A) 02/24/2021 1219   BILIRUBINUR NEGATIVE 02/24/2021 1219   BILIRUBINUR Negative 01/14/2021 [place] 02/24/2021 1219   PROTEINUR NEGATIVE 02/24/2021 1219   UROBILINOGEN 1.0 10/17/2020 1627   NITRITE NEGATIVE 02/24/2021 1219   LEUKOCYTESUR NEGATIVE 02/24/2021 1219   Sepsis Labs Invalid input(s): PROCALCITONIN,  WBC,   LACTICIDVEN Microbiology Recent Results (from the past 240 hour(s))  Culture, blood (single) w Reflex to ID Panel     Status: None (Preliminary result)   Collection Time: 02/24/21  5:50 AM   Specimen: BLOOD  Result Value Ref Range Status   Specimen Description   Final    BLOOD RIGHT ANTECUBITAL Performed at [facility], [place] [address] [place], [place] [address]    Special Requests   Final    BOTTLES DRAWN AEROBIC AND ANAEROBIC Blood Culture adequate volume Performed at [facility] [address], [place], [place] [address]    Culture   Final    NO GROWTH 4 DAYS Performed at [facility], [place] [address] [place],  [address]    Report Status PENDING  Incomplete  Resp Panel by RT-PCR (Flu A&B, Covid) Nasopharyngeal Swab     Status: None   Collection Time: 02/24/21  8:08 AM   Specimen: Nasopharyngeal Swab; Nasopharyngeal(NP) swabs in vial transport medium  Result Value Ref Range Status   SARS Coronavirus 2 by RT PCR NEGATIVE NEGATIVE Final    Comment: (NOTE) SARS-CoV-2 target nucleic acids are NOT DETECTED.  The SARS-CoV-2 RNA is generally detectable in upper respiratory specimens during the acute phase of infection. The lowest concentration of SARS-CoV-2 viral copies this assay can detect is 138 copies/mL. A negative result does not preclude SARS-Cov-2 infection and should not be used as the sole basis for treatment or other patient management decisions. A negative result may occur with  improper specimen collection/handling, submission of specimen other than nasopharyngeal swab, presence of viral mutation(s) within the areas targeted by this assay, and inadequate number of viral copies(<138 copies/mL). A negative result must be combined with clinical observations, patient history, and epidemiological information. The expected result is Negative.  Fact Sheet for Patients:  EntrepreneurPulse.com.au  Fact  Sheet for Healthcare Providers:  IncredibleEmployment.be  This test is no t yet approved or cleared by the [place] FDA and  has been authorized for detection and/or diagnosis of SARS-CoV-2 by FDA under an Emergency Use Authorization (EUA). This EUA will remain  in effect (meaning this test can be used) for the duration of the COVID-19 declaration under Section 564(b)(1) of the Act, 21 U.S.C.section 360bbb-3(b)(1), unless the authorization is terminated  or revoked sooner.       Influenza A by PCR NEGATIVE NEGATIVE Final   Influenza B by PCR NEGATIVE NEGATIVE Final    Comment: (NOTE) The Xpert Xpress SARS-CoV-2/FLU/RSV plus assay is intended as an aid in the diagnosis of influenza from Nasopharyngeal swab specimens and should not be used as a sole basis for treatment. Nasal washings and aspirates are unacceptable for Xpert Xpress SARS-CoV-2/FLU/RSV testing.  Fact Sheet for Patients: EntrepreneurPulse.com.au  Fact Sheet for Healthcare Providers: IncredibleEmployment.be  This test is not yet approved or cleared by the [place] FDA and has been authorized for detection and/or diagnosis of SARS-CoV-2 by FDA under an Emergency Use Authorization (EUA). This EUA will remain in effect (meaning this test can be used) for the duration of the  COVID-19 declaration under Section 564(b)(1) of the Act, 21 U.S.C. section 360bbb-3(b)(1), unless the authorization is terminated or revoked.  Performed at [facility], [place] [address], [place], [place] [address]   MRSA PCR Screening     Status: None   Collection Time: 02/26/21 10:10 AM   Specimen: Nasal Mucosa; Nasopharyngeal  Result Value Ref Range Status   MRSA by PCR NEGATIVE NEGATIVE Final    Comment:        The GeneXpert MRSA Assay (FDA approved for NASAL specimens only), is one component of a comprehensive MRSA colonization surveillance program. It is  not intended to diagnose MRSA infection nor to guide or monitor treatment for MRSA infections. Performed at [facility], [place] [address], [place],  [address]      Time coordinating discharge: Over 30 minutes  SIGNED:   Courtni Balash J [place] DO  [facility] 03/01/2021, 9:20 AM

## 2021-03-01 NOTE — TOC Progression Note (Signed)
Transition of Care [facility]) - Progression Note    Patient Details  Name: Ian Hall MRN: [id number] Date of Birth: 05/22/1966  Transition of Care [facility]) CM/SW Contact  Joaquin Courts, RN Phone Number: 03/01/2021, 12:19 PM  Clinical Narrative:    CM spoke with patient, patient shares that he is having difficulty with obtaining his medication, states he has gone to pharmacy and was tod there was no script on file or the medication was 1500$.  CM called patient's pharmacy today, confirmed they have received scripts, confirmed Xifaxin available for pickup today after 2pm, pharmacy states insurance will only authorize 30 day supply at a time.  Per pharmacy 30 day supply will have no out of pocket cost to patient.  Patient was updated with thi information.    Alvis Lemmings to provide home health PT services.  [employer] to deliver rolling walker to bedside.  Patient requests information about the diet he can eat with his medical issues, bedside RN made aware of this request, CM suggests dietician speak with patient.    Expected Discharge Plan: Home/Self Care Barriers to Discharge: Continued Medical Work up  Expected Discharge Plan and Services Expected Discharge Plan: Home/Self Care   Discharge Planning Services: CM Consult   Living arrangements for the past 2 months: Single Family Home Expected Discharge Date: 03/01/21               DME Arranged: Gilford Rile rolling DME Agency: Other - Comment Celesta Aver) Date DME Agency Contacted: 03/01/21 Time DME Agency Contacted: 1218 Representative spoke with at DME Agency: [place]: PT [place]: [place] Date [place]: 03/01/21 Time [place]: 1218 Representative spoke with at [place]: [place] ([place]) Interventions    Readmission Risk Interventions No flowsheet data found.

## 2021-03-02 ENCOUNTER — Telehealth: Payer: Self-pay

## 2021-03-02 NOTE — Telephone Encounter (Signed)
Transition Care Management Follow-up Telephone Call Date of discharge and from where: 03/01/2021-[place] How have you been since you were released from the hospital? Good Any questions or concerns? No  Items Reviewed: Did the pt receive and understand the discharge instructions provided? Yes  Medications obtained and verified? Yes  Other? Yes  Any new allergies since your discharge? No  Dietary orders reviewed? Yes Do you have support at home? Yes   Home Care and Equipment/Supplies: Were home health services ordered? yes If so, what is the name of the agency? [facility]  Has the agency set up a time to come to the patient's home? no Were any new equipment or medical supplies ordered?  Yes: walker What is the name of the medical supply agency? hospital Were you able to get the supplies/equipment? yes Do you have any questions related to the use of the equipment or supplies? No  Functional Questionnaire: (I = Independent and D = Dependent) ADLs: I  Bathing/Dressing- I  Meal Prep- I WITH ASSISTANCE  Eating- I  Maintaining continence- I  Transferring/Ambulation- I  Managing Meds- I WITH ASSISTANCE  Follow up appointments reviewed:  PCP Hospital f/u appt confirmed? Yes  Scheduled to see Maximiano Coss on 03/10/21 @ 11:10. [place] [facility] f/u appt confirmed? Yes  Scheduled to see Dr Modena Nunnery on 03/04/21 -time unknown. Are transportation arrangements needed? No  If their condition worsens, is the pt aware to call PCP or go to the Emergency Dept.? Yes Was the patient provided with contact information for the PCP's office or ED? Yes Was to pt encouraged to call back with questions or concerns? Yes

## 2021-03-02 NOTE — Telephone Encounter (Signed)
Transition Care Management Unsuccessful Follow-up Telephone Call  Date of discharge and from where:  03/01/2021-[place]  Attempts:  1st Attempt  Reason for unsuccessful TCM follow-up call:  Left voice message

## 2021-03-03 ENCOUNTER — Telehealth: Payer: Self-pay | Admitting: *Deleted

## 2021-03-03 NOTE — Telephone Encounter (Signed)
TOC CM received message from [facility] CM office that pt called regarding medication. Contacted [facility] and spoke to pharmacist. Pt had concerns he only received Kepra 4 tablets. Spoke to attending, Dr [place] an the dose is correct. Pt was scheduled to complete 2 more days of a 7 day course. TOC CM contacted pt to make him aware. [place], [place] ED TOC CM [phone]

## 2021-03-04 ENCOUNTER — Other Ambulatory Visit: Payer: Self-pay | Admitting: Registered Nurse

## 2021-03-04 ENCOUNTER — Ambulatory Visit: Payer: No Typology Code available for payment source | Admitting: Gastroenterology

## 2021-03-04 NOTE — Telephone Encounter (Signed)
Patient has question concerning medication - He thinks his medicine has changed since he left the hospital

## 2021-03-05 NOTE — Telephone Encounter (Signed)
Called patient to see what is concerns were about his medication. Patient states he will call back in about 15 mins

## 2021-03-05 NOTE — Telephone Encounter (Signed)
Patient is requesting a refill of the following medications: Requested Prescriptions   Pending Prescriptions Disp Refills  . levETIRAcetam (KEPPRA) 500 MG tablet 4 tablet 0    Sig: Take 1 tablet (500 mg total) by mouth 2 (two) times daily for 2 days.    Date of patient request: 03/05/2021 Last office visit: 02/04/2021 Date of last refill: 03/01/2021 Last refill amount: 4 tablets Follow up time period per chart:03/10/2021

## 2021-03-10 ENCOUNTER — Telehealth: Payer: Self-pay | Admitting: Registered Nurse

## 2021-03-10 ENCOUNTER — Ambulatory Visit (INDEPENDENT_AMBULATORY_CARE_PROVIDER_SITE_OTHER): Payer: No Typology Code available for payment source | Admitting: Registered Nurse

## 2021-03-10 ENCOUNTER — Other Ambulatory Visit: Payer: Self-pay

## 2021-03-10 VITALS — BP 122/64 | HR 68 | Temp 98.3°F | Resp 16 | Ht 65.0 in | Wt 206.6 lb

## 2021-03-10 DIAGNOSIS — K7290 Hepatic failure, unspecified without coma: Secondary | ICD-10-CM | POA: Diagnosis not present

## 2021-03-10 DIAGNOSIS — I8510 Secondary esophageal varices without bleeding: Secondary | ICD-10-CM | POA: Diagnosis not present

## 2021-03-10 DIAGNOSIS — K7460 Unspecified cirrhosis of liver: Secondary | ICD-10-CM

## 2021-03-10 DIAGNOSIS — I82441 Acute embolism and thrombosis of right tibial vein: Secondary | ICD-10-CM | POA: Diagnosis not present

## 2021-03-10 DIAGNOSIS — Z09 Encounter for follow-up examination after completed treatment for conditions other than malignant neoplasm: Secondary | ICD-10-CM

## 2021-03-10 LAB — CBC WITH DIFFERENTIAL/PLATELET
Basophils Absolute: 0.1 10*3/uL (ref 0.0–0.1)
Basophils Relative: 1 % (ref 0.0–3.0)
Eosinophils Absolute: 0.1 10*3/uL (ref 0.0–0.7)
Eosinophils Relative: 1.8 % (ref 0.0–5.0)
HCT: 34.4 % — ABNORMAL LOW (ref 39.0–52.0)
Hemoglobin: 11.9 g/dL — ABNORMAL LOW (ref 13.0–17.0)
Lymphocytes Relative: 21.6 % (ref 12.0–46.0)
Lymphs Abs: 1.2 10*3/uL (ref 0.7–4.0)
MCHC: 34.5 g/dL (ref 30.0–36.0)
MCV: 99.6 fl (ref 78.0–100.0)
Monocytes Absolute: 0.7 10*3/uL (ref 0.1–1.0)
Monocytes Relative: 12.1 % — ABNORMAL HIGH (ref 3.0–12.0)
Neutro Abs: 3.6 10*3/uL (ref 1.4–7.7)
Neutrophils Relative %: 63.5 % (ref 43.0–77.0)
Platelets: 62 10*3/uL — ABNORMAL LOW (ref 150.0–400.0)
RBC: 3.45 Mil/uL — ABNORMAL LOW (ref 4.22–5.81)
RDW: 15.5 % (ref 11.5–15.5)
WBC: 5.7 10*3/uL (ref 4.0–10.5)

## 2021-03-10 LAB — COMPREHENSIVE METABOLIC PANEL
ALT: 38 U/L (ref 0–53)
AST: 109 U/L — ABNORMAL HIGH (ref 0–37)
Albumin: 2.7 g/dL — ABNORMAL LOW (ref 3.5–5.2)
Alkaline Phosphatase: 318 U/L — ABNORMAL HIGH (ref 39–117)
BUN: 13 mg/dL (ref 6–23)
CO2: 22 mEq/L (ref 19–32)
Calcium: 8.3 mg/dL — ABNORMAL LOW (ref 8.4–10.5)
Chloride: 102 mEq/L (ref 96–112)
Creatinine, Ser: 1.04 mg/dL (ref 0.40–1.50)
GFR: 81.44 mL/min (ref >60.00)
Glucose, Bld: 79 mg/dL (ref 70–99)
Potassium: 4.2 mEq/L (ref 3.5–5.1)
Sodium: 132 mEq/L — ABNORMAL LOW (ref 135–145)
Total Bilirubin: 7.1 mg/dL — ABNORMAL HIGH (ref 0.2–1.2)
Total Protein: 5.8 g/dL — ABNORMAL LOW (ref 6.0–8.3)

## 2021-03-10 LAB — URINALYSIS
Bilirubin Urine: NEGATIVE
Ketones, ur: NEGATIVE
Leukocytes,Ua: NEGATIVE
Nitrite: NEGATIVE
Specific Gravity, Urine: 1.015 (ref 1.000–1.030)
Total Protein, Urine: NEGATIVE
Urine Glucose: NEGATIVE
Urobilinogen, UA: 0.2 (ref 0.0–1.0)
pH: 5.5 (ref 5.0–8.0)

## 2021-03-10 LAB — MAGNESIUM: Magnesium: 1.5 mg/dL (ref 1.5–2.5)

## 2021-03-10 LAB — PROTIME-INR
INR: 1.6 ratio — ABNORMAL HIGH (ref 0.8–1.0)
Prothrombin Time: 17.9 s — ABNORMAL HIGH (ref 9.6–13.1)

## 2021-03-10 LAB — AMMONIA: Ammonia: 86 umol/L — ABNORMAL HIGH (ref 11–35)

## 2021-03-10 LAB — PHOSPHORUS: Phosphorus: 3.2 mg/dL (ref 2.3–4.6)

## 2021-03-10 MED ORDER — NADOLOL 20 MG PO TABS: 20.0000 mg | ORAL_TABLET | Freq: Every day | ORAL | 0 refills | Status: DC

## 2021-03-10 NOTE — Telephone Encounter (Signed)
Ian Hall is resending the referral for the cardiologist now.

## 2021-03-10 NOTE — Telephone Encounter (Signed)
Patient called the number that Delfino Lovett gave him to get an appointment and patient was told that Delfino Lovett will need to send in a referral before an appointment can be scheduled.

## 2021-03-11 ENCOUNTER — Telehealth: Payer: Self-pay

## 2021-03-11 NOTE — Telephone Encounter (Signed)
Patient walked in office following up on this letter. Told pt Ian Hall would send note through [place].

## 2021-03-11 NOTE — Telephone Encounter (Signed)
Pt called asking if a work note could be sent to him that states the exact date he can go back to work. Please advise.

## 2021-03-11 NOTE — Telephone Encounter (Signed)
Attempted to call patient to discuss referral for cardiologist.

## 2021-03-11 NOTE — Telephone Encounter (Signed)
Pt called following up on this referral. Please advise.

## 2021-03-17 ENCOUNTER — Other Ambulatory Visit: Payer: Self-pay

## 2021-03-17 ENCOUNTER — Encounter: Payer: Self-pay | Admitting: Gastroenterology

## 2021-03-17 ENCOUNTER — Ambulatory Visit (INDEPENDENT_AMBULATORY_CARE_PROVIDER_SITE_OTHER): Payer: No Typology Code available for payment source | Admitting: Gastroenterology

## 2021-03-17 ENCOUNTER — Other Ambulatory Visit (INDEPENDENT_AMBULATORY_CARE_PROVIDER_SITE_OTHER): Payer: No Typology Code available for payment source

## 2021-03-17 VITALS — BP 110/60 | HR 62 | Ht 67.0 in | Wt 202.0 lb

## 2021-03-17 DIAGNOSIS — R7989 Other specified abnormal findings of blood chemistry: Secondary | ICD-10-CM

## 2021-03-17 DIAGNOSIS — D696 Thrombocytopenia, unspecified: Secondary | ICD-10-CM | POA: Diagnosis not present

## 2021-03-17 DIAGNOSIS — K7011 Alcoholic hepatitis with ascites: Secondary | ICD-10-CM

## 2021-03-17 DIAGNOSIS — K7031 Alcoholic cirrhosis of liver with ascites: Secondary | ICD-10-CM

## 2021-03-17 DIAGNOSIS — K7682 Hepatic encephalopathy: Secondary | ICD-10-CM

## 2021-03-17 DIAGNOSIS — K7290 Hepatic failure, unspecified without coma: Secondary | ICD-10-CM

## 2021-03-17 LAB — CBC
HCT: 37.4 % — ABNORMAL LOW (ref 39.0–52.0)
Hemoglobin: 12.8 g/dL — ABNORMAL LOW (ref 13.0–17.0)
MCHC: 34.3 g/dL (ref 30.0–36.0)
MCV: 99.1 fl (ref 78.0–100.0)
Platelets: 60 10*3/uL — ABNORMAL LOW (ref 150.0–400.0)
RBC: 3.77 Mil/uL — ABNORMAL LOW (ref 4.22–5.81)
RDW: 15.3 % (ref 11.5–15.5)
WBC: 4.8 10*3/uL (ref 4.0–10.5)

## 2021-03-17 LAB — COMPREHENSIVE METABOLIC PANEL
ALT: 31 U/L (ref 0–53)
AST: 96 U/L — ABNORMAL HIGH (ref 0–37)
Albumin: 2.8 g/dL — ABNORMAL LOW (ref 3.5–5.2)
Alkaline Phosphatase: 297 U/L — ABNORMAL HIGH (ref 39–117)
BUN: 15 mg/dL (ref 6–23)
CO2: 24 mEq/L (ref 19–32)
Calcium: 8.4 mg/dL (ref 8.4–10.5)
Chloride: 103 mEq/L (ref 96–112)
Creatinine, Ser: 1.33 mg/dL (ref 0.40–1.50)
GFR: 60.62 mL/min (ref >60.00)
Glucose, Bld: 111 mg/dL — ABNORMAL HIGH (ref 70–99)
Potassium: 4.1 mEq/L (ref 3.5–5.1)
Sodium: 135 mEq/L (ref 135–145)
Total Bilirubin: 7.3 mg/dL — ABNORMAL HIGH (ref 0.2–1.2)
Total Protein: 5.8 g/dL — ABNORMAL LOW (ref 6.0–8.3)

## 2021-03-17 LAB — PROTIME-INR
INR: 1.6 ratio — ABNORMAL HIGH (ref 0.8–1.0)
Prothrombin Time: 17.4 s — ABNORMAL HIGH (ref 9.6–13.1)

## 2021-03-17 NOTE — Patient Instructions (Addendum)
It was a pleasure to see you today. Based on our discussion, I am providing you with my recommendations below:  RECOMMENDATION(S):    Continue to abstain from alcohol   Maintain a 2031m sodium restricted diet as provided below   I would like for you to have labs completed today   I did not make any changes to your medication regimen. You indicated you did not need any refills today. Please call your pharmacy if you are in need of refills before your next appointment   I would like for you to follow up with CCarl Best NP in 2 weeks. Please refer to the appointment date and time provided within this AVS  LABS:   . Please proceed to the basement level for lab work before leaving today. Press "B" on the elevator. The lab is located at the first door on the left as you exit the elevator.  HEALTHCARE LAWS AND MY CHART RESULTS:   . Due to recent changes in healthcare laws, you may see results of your imaging and/or laboratory studies on MyChart before I have had a chance to review them.  I understand that in some cases there may be results that are confusing or concerning to you. Please understand that not all results are received at same time and often I may need to interpret multiple results in order to provide you with the best plan of care or course of treatment. Therefore, I ask that you please give me 48 hours to thoroughly review all your results before contacting my office for clarification.   FOLLOW UP:  I would like for you to follow up in 2 weeks.   BMI:  . If you are age 749or younger, your body mass index should be between 19-25. Your There is no height or weight on file to calculate BMI. If this is out of the aformentioned range listed, please consider follow up with your Primary Care Provider.   Thank you for trusting me with your gastrointestinal care!    KThornton Park MD, MPH  _____________________________________________________ Two Gram Sodium Diet  2000 mg  What is Sodium? Sodium is a mineral found naturally in many foods. The most significant source of sodium in the diet is table salt, which is about 40% sodium.  Processed, convenience, and preserved foods also contain a large amount of sodium.  The body needs only 500 mg of sodium daily to function,  A normal diet provides more than enough sodium even if you do not use salt.  Why Limit Sodium? A build up of sodium in the body can cause thirst, increased blood pressure, shortness of breath, and water retention.  Decreasing sodium in the diet can reduce edema and risk of heart attack or stroke associated with high blood pressure.  Keep in mind that there are many other factors involved in these health problems.  Heredity, obesity, lack of exercise, cigarette smoking, stress and what you eat all play a role.  General Guidelines:  Do not add salt at the table or in cooking.  One teaspoon of salt contains over 2 grams of sodium.  Read food labels  Avoid processed and convenience foods  Ask your dietitian before eating any foods not dicussed in the menu planning guidelines  Consult your physician if you wish to use a salt substitute or a sodium containing medication such as antacids.  Limit milk and milk products to 16 oz (2 cups) per day.  Shopping Hints:  READ LABELS!! "Dietetic"  does not necessarily mean low sodium.  Salt and other sodium ingredients are often added to foods during processing.   Menu Planning Guidelines Food Group Choose More Often Avoid  Beverages (see also the milk group All fruit juices, low-sodium, salt-free vegetables juices, low-sodium carbonated beverages Regular vegetable or tomato juices, commercially softened water used for drinking or cooking  Breads and Cereals Enriched white, wheat, rye and pumpernickel bread, hard rolls and dinner rolls; muffins, cornbread and waffles; most dry cereals, cooked cereal without added salt; unsalted crackers and  breadsticks; low sodium or homemade bread crumbs Bread, rolls and crackers with salted tops; quick breads; instant hot cereals; pancakes; commercial bread stuffing; self-rising flower and biscuit mixes; regular bread crumbs or cracker crumbs  Desserts and Sweets Desserts and sweets mad with mild should be within allowance Instant pudding mixes and cake mixes  Fats Butter or margarine; vegetable oils; unsalted salad dressings, regular salad dressings limited to 1 Tbs; light, sour and heavy cream Regular salad dressings containing bacon fat, bacon bits, and salt pork; snack dips made with instant soup mixes or processed cheese; salted nuts  Fruits Most fresh, frozen and canned fruits Fruits processed with salt or sodium-containing ingredient (some dried fruits are processed with sodium sulfites        Vegetables Fresh, frozen vegetables and low- sodium canned vegetables Regular canned vegetables, sauerkraut, pickled vegetables, and others prepared in brine; frozen vegetables in sauces; vegetables seasoned with ham, bacon or salt pork  Condiments, Sauces, Miscellaneous  Salt substitute with physician's approval; pepper, herbs, spices; vinegar, lemon or lime juice; hot pepper sauce; garlic powder, onion powder, low sodium soy sauce (1 Tbs.); low sodium condiments (ketchup, chili sauce, mustard) in limited amounts (1 tsp.) fresh ground horseradish; unsalted tortilla chips, pretzels, potato chips, popcorn, salsa (1/4 cup) Any seasoning made with salt including garlic salt, celery salt, onion salt, and seasoned salt; sea salt, rock salt, kosher salt; meat tenderizers; monosodium glutamate; mustard, regular soy sauce, barbecue, sauce, chili sauce, teriyaki sauce, steak sauce, Worcestershire sauce, and most flavored vinegars; canned gravy and mixes; regular condiments; salted snack foods, olives, picles, relish, horseradish sauce, catsup   Food preparation: Try these seasonings Meats:    Pork Sage, onion  Serve with applesauce  Chicken Poultry seasoning, thyme, parsley Serve with cranberry sauce  Lamb Curry powder, rosemary, garlic, thyme Serve with mint sauce or jelly  Veal Marjoram, basil Serve with current jelly, cranberry sauce  Beef Pepper, bay leaf Serve with dry mustard, unsalted chive butter  Fish [place] leaf, dill Serve with unsalted lemon butter, unsalted parsley butter  Vegetables:    Asparagus Lemon juice   Broccoli Lemon juice   Carrots Mustard dressing parsley, mint, nutmeg, glazed with unsalted butter and sugar   Green beans Marjoram, lemon juice, nutmeg,dill seed   Tomatoes Basil, marjoram, onion   Spice /blend for [employer]" 4 tsp ground thyme 1 tsp ground sage 3 tsp ground rosemary 4 tsp ground marjoram   Test your knowledge 1. A product that says "Salt Free" may still contain sodium. True or False 2. Garlic Powder and Hot Pepper Sauce an be used as alternative seasonings.True or False 3. Processed foods have more sodium than fresh foods.  True or False 4. Canned Vegetables have less sodium than froze True or False  WAYS TO DECREASE YOUR SODIUM INTAKE 1. Avoid the use of added salt in cooking and at the table.  Table salt (and other prepared seasonings which contain salt) is probably one of  the greatest sources of sodium in the diet.  Unsalted foods can gain flavor from the sweet, sour, and butter taste sensations of herbs and spices.  Instead of using salt for seasoning, try the following seasonings with the foods listed.  Remember: how you use them to enhance natural food flavors is limited only by your creativity... Allspice-Meat, fish, eggs, fruit, peas, red and yellow vegetables Almond Extract-Fruit baked goods Anise Seed-Sweet breads, fruit, carrots, beets, cottage cheese, cookies (tastes like licorice) Basil-Meat, fish, eggs, vegetables, rice, vegetables salads, soups, sauces Bay Leaf-Meat, fish, stews, poultry Burnet-Salad, vegetables (cucumber-like  flavor) Caraway Seed-Bread, cookies, cottage cheese, meat, vegetables, cheese, rice Cardamon-Baked goods, fruit, soups Celery Powder or seed-Salads, salad dressings, sauces, meatloaf, soup, bread.Do not use  celery salt Chervil-Meats, salads, fish, eggs, vegetables, cottage cheese (parsley-like flavor) Chili Power-Meatloaf, chicken cheese, corn, eggplant, egg dishes Chives-Salads cottage cheese, egg dishes, soups, vegetables, sauces Cilantro-Salsa, casseroles Cinnamon-Baked goods, fruit, pork, lamb, chicken, carrots Cloves-Fruit, baked goods, fish, pot roast, green beans, beets, carrots Coriander-Pastry, cookies, meat, salads, cheese (lemon-orange flavor) Cumin-Meatloaf, fish,cheese, eggs, cabbage,fruit pie (caraway flavor) [employer], fruit, eggs, fish, poultry, cottage cheese, vegetables Dill Seed-Meat, cottage cheese, poultry, vegetables, fish, salads, bread Fennel Seed-Bread, cookies, apples, pork, eggs, fish, beets, cabbage, cheese, Licorice-like flavor Garlic-(buds or powder) Salads, meat, poultry, fish, bread, butter, vegetables, potatoes.Do not  use garlic salt Ginger-Fruit, vegetables, baked goods, meat, fish, poultry Horseradish Root-Meet, vegetables, butter Lemon Juice or Extract-Vegetables, fruit, tea, baked goods, fish salads Mace-Baked goods fruit, vegetables, fish, poultry (taste like nutmeg) Maple Extract-Syrups Marjoram-Meat, chicken, fish, vegetables, breads, green salads (taste like Sage) Mint-Tea, lamb, sherbet, vegetables, desserts, carrots, cabbage Mustard, Dry or Seed-Cheese, eggs, meats, vegetables, poultry Nutmeg-Baked goods, fruit, chicken, eggs, vegetables, desserts Onion Powder-Meat, fish, poultry, vegetables, cheese, eggs, bread, rice salads (Do not use   Onion salt) Orange Extract-Desserts, baked goods Oregano-Pasta, eggs, cheese, onions, pork, lamb, fish, chicken, vegetables, green salads Paprika-Meat, fish, poultry, eggs, cheese, vegetables Parsley  Flakes-Butter, vegetables, meat fish, poultry, eggs, bread, salads (certain forms may   Contain sodium Pepper-Meat fish, poultry, vegetables, eggs Peppermint Extract-Desserts, baked goods Poppy Seed-Eggs, bread, cheese, fruit dressings, baked goods, noodles, vegetables, cottage  [employer], poultry, meat, fish, cauliflower, turnips,eggs bread Saffron-Rice, bread, veal, chicken, fish, eggs Sage-Meat, fish, poultry, onions, eggplant, tomateos, pork, stews Savory-Eggs, salads, poultry, meat, rice, vegetables, soups, pork Tarragon-Meat, poultry, fish, eggs, butter, vegetables (licorice-like flavor)  Thyme-Meat, poultry, fish, eggs, vegetables, (clover-like flavor), sauces, soups Tumeric-Salads, butter, eggs, fish, rice, vegetables (saffron-like flavor) Vanilla Extract-Baked goods, candy Vinegar-Salads, vegetables, meat marinades Walnut Extract-baked goods, candy  2. Choose your Foods Wisely   The following is a list of foods to avoid which are high in sodium:  Meats-Avoid all smoked, canned, salt cured, dried and kosher meat and fish as well as Anchovies   [employer] meats:Bologna, Liverwurst, Pastrami Canned meat or fish  Marinated herring Caviar    Pepperoni Corned Beef   Pizza Dried chipped beef  Salami Frozen breaded fish or meat Salt pork Frankfurters or hot dogs  Sardines Gefilte fish   Sausage Ham (boiled ham, Proscuitto Smoked butt    spiced ham)   Spam      TV Dinners Vegetables Canned vegetables (Regular) Relish Canned mushrooms  Sauerkraut Olives    Tomato juice Pickles  Bakery and Dessert Products Canned puddings  Cream pies Cheesecake   Decorated cakes Cookies  Beverages/Juices Tomato juice, regular  Gatorade   V-8 vegetable juice, regular  Breads and Cereals Biscuit mixes   Salted potato chips, corn chips, pretzels Bread stuffing mixes  Salted crackers and rolls Pancake and waffle mixes Self-rising  flour  Seasonings Accent    Meat sauces Barbecue sauce  Meat tenderizer Catsup    Monosodium glutamate (MSG) Celery salt   Onion salt Chili sauce   Prepared mustard Garlic salt   Salt, seasoned salt, sea salt Gravy mixes   Soy sauce Horseradish   Steak sauce Ketchup   Tartar sauce Lite salt    Teriyaki sauce Marinade mixes   Worcestershire sauce  Others Baking powder   Cocoa and cocoa mixes Baking soda   Commercial casserole mixes Candy-caramels, chocolate  Dehydrated soups    Bars, fudge,nougats  Instant rice and pasta mixes Canned broth or soup  Maraschino cherries Cheese, aged and processed cheese and cheese spreads  Learning Assessment Quiz  Indicated T (for True) or F (for False) for each of the following statements:  1. _____ Fresh fruits and vegetables and unprocessed grains are generally low in sodium 2. _____ Water may contain a considerable amount of sodium, depending on the source 3. _____ You can always tell if a food is high in sodium by tasting it 4. _____ Certain laxatives my be high in sodium and should be avoided unless prescribed   by a physician or pharmacist 5. _____ Salt substitutes may be used freely by anyone on a sodium restricted diet 6. _____ Sodium is present in table salt, food additives and as a natural component of   most foods 7. _____ Table salt is approximately 90% sodium 8. _____ Limiting sodium intake may help prevent excess fluid accumulation in the body 9. _____ On a sodium-restricted diet, seasonings such as bouillon soy sauce, and    cooking wine should be used in place of table salt 10. _____ On an ingredient list, a product which lists monosodium glutamate as the first   ingredient is an appropriate food to include on a low sodium diet  Circle the best answer(s) to the following statements (Hint: there may be more than one correct answer)  11. On a low-sodium diet, some acceptable snack items are:    A. Olives  F. Bean dip   K.  Grapefruit juice    B. Salted Pretzels G. Commercial Popcorn   L. Canned peaches    C. Carrot Sticks  H. Bouillon   M. Unsalted nuts   D. [place] fries  I. Peanut butter crackers N. Salami   E. Sweet pickles J. Tomato Juice   O. Pizza  12.  Seasonings that may be used freely on a reduced - sodium diet include   A. Lemon wedges F.Monosodium glutamate K. Celery seed    B.Soysauce   G. Pepper   L. Mustard powder   C. Sea salt  H. Cooking wine  M. Onion flakes   D. Vinegar  E. Prepared horseradish N. Salsa   E. Sage   J. Worcestershire sauce  O. Chutney

## 2021-03-17 NOTE — Progress Notes (Signed)
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.  Abdominal imaging every 6 months for [facility] screening.  Hepatic encephalopathy: Continue lactulose and Xifaximin.  Ascites and peripheral edema: Continue 2000 mg sodium restricted diet, titrate diuretics as tolerated. Labs today to follow his renal function.   Suspected alcoholic hepatitis: Steroid stopped due to lack of response.  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 High risk for cholecystectomy at this time. Discussed potential symptoms of symptomatic gallbladder disease.    PLAN: CMP, CBC, PT/INR Continue 2000 mg sodium restricted diet Continue diuretics including Aldactone 100 mg daily and Lasix 40 mg daily Continue lactulose and Xifaxan Follow-up with [facility] for liver transplant evaluation Follow-up with me or Colleen in 2-4 weeks, earlier if needed MRI due 06/2021 for hepatocellular carcinoma He was counseled to continue to abstain from all alcohol  Please see the "Patient Instructions" section for addition details about the plan.  HPI: Ian Hall is a 55 y.o. male who returns in follow-up after his recent hospitalization. Discharged 03/01/21 after  hospitalization for hepatic encephalopathy. Interval history obtained through the patient and review of his electronic health record.   Admitted with altered mental status and possible seizure disorder. Required temporary intubation for airway protection. Responded to lactulose and Xifaxan.  Overall doing "okay" since discharge. Notes that he has severe fatigue.   He was seen at [facility]. They are waiting for 6 months of sobriety prior to initiating transplantation evaluation. He will have follow-up with them in June 2022. He was under the impression that he would be on the transplant list starting in June 2022. He is very motivated to live and wants to get a transplant.  He reports no alcohol use since December 2021.   Recent evaluation includes: - CT abd/pelvis with contrast 12/24/20: cirrhosis with perihepatic ascites and splenomegaly, patent portal vein, calcified gallbladder wall versus large stone in the GB (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), diverticulosis, colonic wall thickening, anasarca - Started steroids for possible alcoholic hepatitis - Abdominal ultrasound 12/26/20: calcified gallbladder wall versus large gallstone - Not enough ascites for paracentesis 12/27/20 - Stool + for Enteroaggregative E coli, treated with Cipro - EGD 12/30/20: food in the stomach, no varices or portal hypertensive gastropathy - Colonoscopy 12/30/20: patchy colitis, diverticulosis - MRI 2/9/222: cirrhosis, splenomegaly, small volume ascites, small gastroesophageal and paraumbilical varices, mild porta hepatis adenopathy, cholelithiasis, colonic diverticulosis - Liver biopsy 01/09/21: Cirrhosis of uncertain etiology, marked cholestasis, no iron overload - suspected burnt out NASH or ASH, possible overlapping drug injury - Venogram 01/09/21: Portosystemic gradient of 5m Hg - Head CT 02/24/21 - no acute abnormality - Limited RUQ ultrasound 02/24/21: Limited evaluation. Echogenic  liver.     Past Medical History:  Diagnosis Date  . Anxiety   .  Cirrhosis ([place])   . COVID-19     Past Surgical History:  Procedure Laterality Date  . ANKLE SURGERY Right   . IR PARACENTESIS  01/07/2021  . IR PARACENTESIS  01/30/2021  . IR TRANSCATHETER BX  01/09/2021  . IR US GUIDE VASC ACCESS RIGHT  01/09/2021  . IR VENOGRAM HEPATIC W HEMODYNAMIC EVALUATION  01/09/2021    Current Outpatient Medications  Medication Sig Dispense Refill  . dicyclomine (BENTYL) 10 MG capsule Take 1 capsule (10 mg total) by mouth 4 (four) times daily -  before meals and at bedtime. 354 capsule 2  . folic acid (FOLVITE) 1 MG tablet Take 1 tablet (1 mg total) by mouth daily. 30 tablet 1  . furosemide (LASIX) 40 MG tablet Take 1 tablet (40 mg total) by mouth daily. 90 tablet 0  . lactulose (CHRONULAC) 10 GM/15ML solution Take 45 mLs (30 g total) by mouth 3 (three) times daily. 12150 mL 0  . nadolol (CORGARD) 20 MG tablet Take 1 tablet (20 mg total) by mouth daily. 90 tablet 0  . pantoprazole (PROTONIX) 40 MG tablet Take 1 tablet (40 mg total) by mouth 2 (two) times daily. 60 tablet 0  . rifaximin (XIFAXAN) 550 MG TABS tablet Take 1 tablet (550 mg total) by mouth 2 (two) times daily. 180 tablet 0  . spironolactone (ALDACTONE) 100 MG tablet Take 1 tablet (100 mg total) by mouth daily. 90 tablet 0   No current facility-administered medications for this visit.    Allergies as of 03/17/2021 - Review Complete 03/17/2021  Allergen Reaction Noted  . Hydrocodone Itching and Other (See Comments) 08/26/2015      Physical Exam: General:   Alert, in NAD. Scleral icterus Heart:  Regular rate and rhythm; no murmurs Pulm: Clear anteriorly; no wheezing Abdomen:  Protuberent but soft. Nontender. Nondistended. Normal bowel sounds. No rebound or guarding. No fluid wave.  No hepatosplenomegaly. LAD: No inguinal or umbilical LAD Extremities:  1+ LE edema Neurologic:  Alert and  oriented x4;  grossly normal  neurologically; no asterixis or clonus. Skin: No jaundice. Mild palmar erythema. Few spider angioma on the chest wall.  No Terry's nails. Psych:  Alert and cooperative. Normal mood and affect.     Mattheu Brodersen L. Tarri Glenn, MD, MPH 03/17/2021, 2:15 PM

## 2021-03-19 ENCOUNTER — Ambulatory Visit (INDEPENDENT_AMBULATORY_CARE_PROVIDER_SITE_OTHER): Payer: No Typology Code available for payment source | Admitting: Registered Nurse

## 2021-03-19 ENCOUNTER — Encounter: Payer: Self-pay | Admitting: Registered Nurse

## 2021-03-19 ENCOUNTER — Other Ambulatory Visit: Payer: Self-pay

## 2021-03-19 VITALS — BP 128/72 | HR 62 | Temp 98.0°F | Resp 16 | Ht 67.0 in | Wt 201.6 lb

## 2021-03-19 DIAGNOSIS — J302 Other seasonal allergic rhinitis: Secondary | ICD-10-CM | POA: Diagnosis not present

## 2021-03-19 DIAGNOSIS — I82441 Acute embolism and thrombosis of right tibial vein: Secondary | ICD-10-CM

## 2021-03-19 DIAGNOSIS — H8112 Benign paroxysmal vertigo, left ear: Secondary | ICD-10-CM | POA: Diagnosis not present

## 2021-03-19 DIAGNOSIS — D638 Anemia in other chronic diseases classified elsewhere: Secondary | ICD-10-CM

## 2021-03-19 DIAGNOSIS — H6123 Impacted cerumen, bilateral: Secondary | ICD-10-CM

## 2021-03-19 DIAGNOSIS — K7290 Hepatic failure, unspecified without coma: Secondary | ICD-10-CM

## 2021-03-19 DIAGNOSIS — K7460 Unspecified cirrhosis of liver: Secondary | ICD-10-CM

## 2021-03-19 MED ORDER — AZELASTINE HCL 0.1 % NA SOLN: 1.0000 | Freq: Every day | NASAL | 12 refills | Status: DC

## 2021-03-19 MED ORDER — MECLIZINE HCL 12.5 MG PO TABS: 12.5000 mg | ORAL_TABLET | Freq: Every day | ORAL | 0 refills | Status: DC | PRN

## 2021-03-19 NOTE — Patient Instructions (Signed)
Mr. Saha -   Always a pleasure to see you.   To apply for social security disability, go to the following website:  [email or web address]  I will also place a referral to Social Work to see if they can help with the process of getting an application set. Please do this as soon as you can.  I have sent a nasal spray and a medication for dizziness to your pharmacy for your symptoms:  Nasal spray: azelastine: use 1 spray in each nostril daily. This will help keep sinuses clear and help combat allergies.  For Dizziness: Meclizine: take 1 tablet 12.74m total once daily as needed for dizziness.   Let me know if these don't help or if they make anything worse  Continue to avoid alcohol and tylenol at all costs. Making it to June to start your candidacy for a liver transplant is crucial - this is the only chance we have at getting you any better.   Thank you   Rich

## 2021-03-23 ENCOUNTER — Encounter: Payer: Self-pay | Admitting: Gastroenterology

## 2021-03-23 NOTE — Progress Notes (Signed)
Established Patient Office Visit  Subjective:  Patient ID: BROWNIE NEHME, male    DOB: June 21, 1966  Age: 55 y.o. MRN: [id number]  CC:  Chief Complaint  Patient presents with  . Hospitalization Follow-up    Pt was recently hospitalized due to dizzy spells , notes sometimes feels as though he has water in his ears     HPI EUSEBIO BLAZEJEWSKI presents for follow up   Feeling well overall - again concerned when he could return to work.  He cites his growing concern for finances - stating that he is unsure he can make ends meet with his short term leave payments. We discussed his candidacy for permanent disability. Given his decompensating liver failure, I think this is a prudent move. Should he become the ideal candidate and receive a transplant, the extent of his care and the precautions he would need to take limit to a great extent his ability to participate in any meaningful work, let alone the physical nature of the job he had held.  Otherwise, he notes some mild dizziness and fullness in ears. Has not happened before. No headaches or visual changes. Worst when changing directions quickly, but seems to be muffled at other times as well. Feeling full without any overt pain. No drainage or other URI symptoms.  No further concerns.  Past Medical History:  Diagnosis Date  . Anxiety   . Cirrhosis ([place])   . COVID-19     Past Surgical History:  Procedure Laterality Date  . ANKLE SURGERY Right   . IR PARACENTESIS  01/07/2021  . IR PARACENTESIS  01/30/2021  . IR TRANSCATHETER BX  01/09/2021  . IR US GUIDE VASC ACCESS RIGHT  01/09/2021  . IR VENOGRAM HEPATIC W HEMODYNAMIC EVALUATION  01/09/2021    Family History  Problem Relation Age of Onset  . Diabetes Mother   . Memory loss Mother   . Diabetes Father   . Diabetes Cousin     Social History   Socioeconomic History  . Marital status: Married    Spouse name: Not on file  . Number of children: 0  . Years of education: Not on file  .  Highest education level: Not on file  Occupational History  . Occupation: IT sales professional: [employer]  Tobacco Use  . Smoking status: Former Smoker    Types: Cigarettes    Quit date: 12/16/2002    Years since quitting: 18.2  . Smokeless tobacco: Never Used  Vaping Use  . Vaping Use: Never used  Substance and Sexual Activity  . Alcohol use: Not Currently    Comment: none for 3-4 years (stated 12/16/2020)  . Drug use: No  . Sexual activity: Yes    Birth control/protection: None  Other Topics Concern  . Not on file  Social History Narrative  . Not on file   Social Determinants of Health   Financial Resource Strain: Not on file  Food Insecurity: Not on file  Transportation Needs: Not on file  Physical Activity: Not on file  Stress: Not on file  Social Connections: Not on file  Intimate Partner Violence: Not on file    Outpatient Medications Prior to Visit  Medication Sig Dispense Refill  . dicyclomine (BENTYL) 10 MG capsule Take 1 capsule (10 mg total) by mouth 4 (four) times daily -  before meals and at bedtime. 505 capsule 2  . folic acid (FOLVITE) 1 MG tablet Take 1 tablet (1 mg total) by mouth  daily. 30 tablet 1  . furosemide (LASIX) 40 MG tablet Take 1 tablet (40 mg total) by mouth daily. 90 tablet 0  . lactulose (CHRONULAC) 10 GM/15ML solution Take 45 mLs (30 g total) by mouth 3 (three) times daily. 12150 mL 0  . nadolol (CORGARD) 20 MG tablet Take 1 tablet (20 mg total) by mouth daily. 90 tablet 0  . pantoprazole (PROTONIX) 40 MG tablet Take 1 tablet (40 mg total) by mouth 2 (two) times daily. 60 tablet 0  . rifaximin (XIFAXAN) 550 MG TABS tablet Take 1 tablet (550 mg total) by mouth 2 (two) times daily. 180 tablet 0  . spironolactone (ALDACTONE) 100 MG tablet Take 1 tablet (100 mg total) by mouth daily. 90 tablet 0   No facility-administered medications prior to visit.    Allergies  Allergen Reactions  . Hydrocodone Itching and Other (See  Comments)    Noted with hydrocodone cough syrup. Itching only, no hives.  02/03/16: denied itching with recent hydrocodone cough syrup. Only one episode of itching previously.     ROS Review of Systems  Constitutional: Negative.   HENT: Positive for ear pain (pressure) and hearing loss.   Eyes: Negative.   Respiratory: Negative.   Cardiovascular: Negative.   Gastrointestinal: Negative.   Genitourinary: Negative.   Musculoskeletal: Negative.   Skin: Negative.   Neurological: Positive for dizziness (mild, transient).  Psychiatric/Behavioral: Negative.   All other systems reviewed and are negative.     Objective:    Physical Exam Constitutional:      General: He is not in acute distress.    Appearance: Normal appearance. He is normal weight. He is not ill-appearing, toxic-appearing or diaphoretic.  HENT:     Right Ear: There is impacted cerumen.     Left Ear: There is impacted cerumen.  Cardiovascular:     Rate and Rhythm: Normal rate and regular rhythm.     Heart sounds: Normal heart sounds. No murmur heard. No friction rub. No gallop.   Pulmonary:     Effort: Pulmonary effort is normal. No respiratory distress.     Breath sounds: Normal breath sounds. No stridor. No wheezing, rhonchi or rales.  Chest:     Chest wall: No tenderness.  Neurological:     General: No focal deficit present.     Mental Status: He is alert and oriented to person, place, and time. Mental status is at baseline.  Psychiatric:        Mood and Affect: Mood normal.        Behavior: Behavior normal.        Thought Content: Thought content normal.        Judgment: Judgment normal.     BP 128/72   Pulse 62   Temp 98 F (36.7 C) (Temporal)   Resp 16   Ht _0  (1.702 m)   Wt 201 lb 9.6 oz (91.4 kg)   SpO2 98%   BMI 31.58 kg/m  Wt Readings from Last 3 Encounters:  03/19/21 201 lb 9.6 oz (91.4 kg)  03/17/21 202 lb (91.6 kg)  03/10/21 206 lb 9.6 oz (93.7 kg)     There are no preventive  care reminders to display for this patient.  There are no preventive care reminders to display for this patient.  Lab Results  Component Value Date   TSH 2.160 01/14/2021   Lab Results  Component Value Date   WBC 4.8 03/17/2021   HGB 12.8 (L) 03/17/2021   HCT 37.4 (  L) 03/17/2021   MCV 99.1 03/17/2021   PLT 60.0 Repeated and verified X2. (L) 03/17/2021   Lab Results  Component Value Date   NA 135 03/17/2021   K 4.1 03/17/2021   CO2 24 03/17/2021   GLUCOSE 111 (H) 03/17/2021   BUN 15 03/17/2021   CREATININE 1.33 03/17/2021   BILITOT 7.3 (H) 03/17/2021   ALKPHOS 297 (H) 03/17/2021   AST 96 (H) 03/17/2021   ALT 31 03/17/2021   PROT 5.8 (L) 03/17/2021   ALBUMIN 2.8 (L) 03/17/2021   CALCIUM 8.4 03/17/2021   ANIONGAP 6 03/01/2021   EGFR 104 02/04/2021   GFR 60.62 03/17/2021   Lab Results  Component Value Date   CHOL 132 01/14/2021   Lab Results  Component Value Date   HDL 12 (L) 01/14/2021   Lab Results  Component Value Date   LDLCALC 96 01/14/2021   Lab Results  Component Value Date   TRIG 96 02/25/2021   Lab Results  Component Value Date   CHOLHDL 11.0 (H) 01/14/2021   Lab Results  Component Value Date   HGBA1C 4.3 (L) 02/04/2021      Assessment & Plan:   Problem List Items Addressed This Visit   None   Visit Diagnoses    Seasonal allergies    -  Primary   Relevant Medications   azelastine (ASTELIN) 0.1 % nasal spray   Benign paroxysmal positional vertigo of left ear       Relevant Medications   meclizine (ANTIVERT) 12.5 MG tablet      Meds ordered this encounter  Medications  . meclizine (ANTIVERT) 12.5 MG tablet    Sig: Take 1 tablet (12.5 mg total) by mouth daily as needed for dizziness.    Dispense:  30 tablet    Refill:  0    Order Specific Question:   Supervising Provider    Answer:   Carlota Raspberry, JEFFREY R [[id number]]  . azelastine (ASTELIN) 0.1 % nasal spray    Sig: Place 1 spray into both nostrils daily. Use in each nostril as directed     Dispense:  30 mL    Refill:  12    Order Specific Question:   Supervising Provider    Answer:   Carlota Raspberry, JEFFREY R [[id number]]    Follow-up: Return in about 6 weeks (around 04/30/2021) for Liver Failure.   PLAN  Bilateral impacted cerumen. No doubt contributing to symptoms. Lavage by Noah Delaine, CMA, successful and patient reports immediate relief of symptoms.  Cannot rule out possible bppv. Will give meclizine to take one daily and azelastine to use once daily as needed for upper respiratory symptoms and bppv. Will monitor closely  Recent labs from Dr. Tarri Glenn office indicate stability, doubt his liver failure and related concerns are contributing to his complaints today  Spent time with the patient discussing applying for permanent disability. Will refer to social services to see if there is further assistance for him as I am not particularly well versed in disability matters.  Patient encouraged to call clinic with any questions, comments, or concerns.  Maximiano Coss, NP

## 2021-03-26 ENCOUNTER — Telehealth: Payer: Self-pay | Admitting: Registered Nurse

## 2021-03-26 ENCOUNTER — Other Ambulatory Visit: Payer: Self-pay | Admitting: Registered Nurse

## 2021-03-26 DIAGNOSIS — K7290 Hepatic failure, unspecified without coma: Secondary | ICD-10-CM

## 2021-03-26 DIAGNOSIS — K7460 Unspecified cirrhosis of liver: Secondary | ICD-10-CM

## 2021-03-26 DIAGNOSIS — H8112 Benign paroxysmal vertigo, left ear: Secondary | ICD-10-CM

## 2021-03-26 MED ORDER — FOLIC ACID 1 MG PO TABS: 1.0000 mg | ORAL_TABLET | Freq: Every day | ORAL | 1 refills | Status: DC

## 2021-03-26 NOTE — Telephone Encounter (Signed)
..  Type of form received:  Disability Additional comments:   Received by:  Tye Savoy should be Faxed to:  [phone]  Form should be mailed to:    Is patient requesting call for pickup:   Form placed:  In [employer] bin up front  Attach charge sheet.  Provider will determine charge.  Individual made aware of 3-5 business day turn around (Y/N)?

## 2021-03-26 NOTE — Telephone Encounter (Signed)
Made copy of forms and printed 01/16/2021 form we had filled out previously

## 2021-03-26 NOTE — Telephone Encounter (Signed)
..  Medication Refills  Last OV:  Medication:  Folic acid   Pharmacy:  [facility] corner of [place]  Let patient know to contact pharmacy at the end of the day to make sure medication is ready.   Please notify patient to allow 48-72 hours to process.  Encourage patient to contact the pharmacy for refills or they can request refills through [place] out below:   Last refill:  QTY:  Refill Date:    Other Comments:   Okay for refill?  Please advise.

## 2021-03-30 NOTE — Telephone Encounter (Signed)
Faxed to "[employer]" - [phone]

## 2021-03-31 ENCOUNTER — Other Ambulatory Visit (INDEPENDENT_AMBULATORY_CARE_PROVIDER_SITE_OTHER): Payer: No Typology Code available for payment source

## 2021-03-31 ENCOUNTER — Encounter: Payer: Self-pay | Admitting: Nurse Practitioner

## 2021-03-31 ENCOUNTER — Ambulatory Visit: Payer: No Typology Code available for payment source | Admitting: Nurse Practitioner

## 2021-03-31 VITALS — BP 110/70 | HR 62 | Ht 67.0 in | Wt 202.0 lb

## 2021-03-31 DIAGNOSIS — K7460 Unspecified cirrhosis of liver: Secondary | ICD-10-CM

## 2021-03-31 DIAGNOSIS — K7031 Alcoholic cirrhosis of liver with ascites: Secondary | ICD-10-CM

## 2021-03-31 DIAGNOSIS — K7290 Hepatic failure, unspecified without coma: Secondary | ICD-10-CM

## 2021-03-31 LAB — CBC WITH DIFFERENTIAL/PLATELET
Basophils Absolute: 0.1 10*3/uL (ref 0.0–0.1)
Basophils Relative: 0.8 % (ref 0.0–3.0)
Eosinophils Absolute: 0.1 10*3/uL (ref 0.0–0.7)
Eosinophils Relative: 1.8 % (ref 0.0–5.0)
HCT: 41 % (ref 39.0–52.0)
Hemoglobin: 14.3 g/dL (ref 13.0–17.0)
Lymphocytes Relative: 26.7 % (ref 12.0–46.0)
Lymphs Abs: 2.1 10*3/uL (ref 0.7–4.0)
MCHC: 34.8 g/dL (ref 30.0–36.0)
MCV: 96.6 fl (ref 78.0–100.0)
Monocytes Absolute: 0.9 10*3/uL (ref 0.1–1.0)
Monocytes Relative: 11 % (ref 3.0–12.0)
Neutro Abs: 4.7 10*3/uL (ref 1.4–7.7)
Neutrophils Relative %: 59.7 % (ref 43.0–77.0)
Platelets: 72 10*3/uL — ABNORMAL LOW (ref 150.0–400.0)
RBC: 4.24 Mil/uL (ref 4.22–5.81)
RDW: 14.7 % (ref 11.5–15.5)
WBC: 7.8 10*3/uL (ref 4.0–10.5)

## 2021-03-31 LAB — PROTIME-INR
INR: 1.4 ratio — ABNORMAL HIGH (ref 0.8–1.0)
Prothrombin Time: 15.8 s — ABNORMAL HIGH (ref 9.6–13.1)

## 2021-03-31 LAB — HEPATIC FUNCTION PANEL
ALT: 36 U/L (ref 0–53)
AST: 118 U/L — ABNORMAL HIGH (ref 0–37)
Albumin: 3.2 g/dL — ABNORMAL LOW (ref 3.5–5.2)
Alkaline Phosphatase: 303 U/L — ABNORMAL HIGH (ref 39–117)
Bilirubin, Direct: 3.4 mg/dL — ABNORMAL HIGH (ref 0.0–0.3)
Total Bilirubin: 8.1 mg/dL — ABNORMAL HIGH (ref 0.2–1.2)
Total Protein: 6.5 g/dL (ref 6.0–8.3)

## 2021-03-31 LAB — BASIC METABOLIC PANEL
BUN: 17 mg/dL (ref 6–23)
CO2: 21 mEq/L (ref 19–32)
Calcium: 8.9 mg/dL (ref 8.4–10.5)
Chloride: 98 mEq/L (ref 96–112)
Creatinine, Ser: 1.13 mg/dL (ref 0.40–1.50)
GFR: 73.69 mL/min (ref >60.00)
Glucose, Bld: 110 mg/dL — ABNORMAL HIGH (ref 70–99)
Potassium: 4.8 mEq/L (ref 3.5–5.1)
Sodium: 126 mEq/L — ABNORMAL LOW (ref 135–145)

## 2021-03-31 NOTE — Progress Notes (Signed)
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 He remains abstinent from alcohol since 11/28/2020. He was seen in office by Dr. Tarri Glenn on following his hospital admission 3/29 - 03/01/2021 with hepatic encephalopathy. Refer to her consult note for comprehensive history. He presents today for cirrhosis follow up. He denies having any nausea or vomiting. He has intermittent LUQ pain which occurs daily and is unrelated to eating and is more noticeable when walking.  He is passing 1 or 2 normal formed brown stools daily.  No melena or bright red rectal bleeding.  He denies having any abdominal swelling or lower extremity edema.  He is adhering to a low-sodium diet.  He complains of occasional forgetfulness without significant confusion.  He feels slightly dizzy when he bends down to pick something up from the floor or when he ties his shoes.  No Chest pain or palpitations.  He has shortness of breath when walking for 10 minutes then rests briefly and he is able to continue his walk.  No cough or hemoptysis.  He underwent an echo 02/24/2021 which showed LVEF 60-60% and grade 1 diastolic dysfunction.  He complains of generalized itchiness which is worse to his back and legs at nighttime.  His ears feel plugged and he intends to follow-up with his PCP regarding this issue.  No tinnitus.  He reports being compliant with taking furosemide 40 mg once daily, spironolactone 100 mg once daily, Xifaxan 550 mg 1 tab twice daily, Lactulose 30 g p.o. 3 times daily, Nadolol 20 mg daily, Pantoprazole 40 mg twice daily and folic acid 1 mg daily.  He underwent an EGD 12/30/2020 which identified diffuse mild mucosal changes with discoloration in the entire  esophagus, biopsies were consistent with reflux. No evidence of esophageal or gastric varices.  A medium amount of food residue was in the stomach with mild inflammation.  No evidence of H. pylori.  He underwent a colonoscopy on the same date which resulted in a poor prep.  Diverticulosis was identified to the descending and sigmoid colon, erythematous mucosa in the ascending and proximal transverse colon and hemorrhoids.  Biopsies were benign, no colitis or dysplasia.  A repeat colonoscopy was recommended.  His last paracentesis was completed on 01/30/2021, at that time 2.4 L of peritoneal fluid was removed without SBP.    EGD 12/30/2020:  - Discolored mucosa in the esophagus. Biopsied. - A medium amount of food (residue) in the stomach. - Erythematous mucosa in the gastric body. Biopsied. Normal stomach. - The examination was otherwise normal.  Colonoscopy 12/30/2020: - Preparation of the colon was poor. Formed stool present throughout the colon. No meaningful evaluation for polyps could be performed today. - Hemorrhoids found on perianal exam. - Diverticulosis in the sigmoid colon and in the descending colon. - Erythematous mucosa in the ascending colon and proximal transverse colon. Biopsied. - The examination was otherwise normal on direct and retroflexion views. Biopsy Report: 1. Surgical [P], random gastric bx - REACTIVE GASTROPATHY WITH EROSIONS. Ian Hall IS NEGATIVE FOR HELICOBACTER PYLORI. - NO INTESTINAL METAPLASIA, DYSPLASIA, OR MALIGNANCY. 2. Surgical [P], random esophageal bx - REFLUX CHANGES. - NO INTESTINAL METAPLASIA, DYSPLASIA, OR MALIGNANCY. 3. Surgical [P], right colon bx - BENIGN COLONIC MUCOSA. - NO ACTIVE INFLAMMATION  OR EVIDENCE OF MICROSCOPIC COLITIS. - NO DYSPLASIA OR MALIGNANCY   CBC Latest Ref Rng & Units 03/17/2021 03/10/2021 03/01/2021  WBC 4.0 - 10.5 K/uL 4.8 5.7 7.5  Hemoglobin 13.0 - 17.0 g/dL 12.8(L) 11.9(L) 10.9(L)  Hematocrit 39.0 - 52.0 %  37.4(L) 34.4(L) 31.9(L)  Platelets 150.0 - 400.0 K/uL 60.0 Repeated and verified X2.(L) 62.0(L) 76(L)  INR 1.6 on 03/17/2021  CMP Latest Ref Rng & Units 03/17/2021 03/10/2021 03/01/2021  Glucose 70 - 99 mg/dL 111(H) 79 111(H)  BUN 6 - 23 mg/dL 15 13 21(H)  Creatinine 0.40 - 1.50 mg/dL 1.33 1.04 0.86  Sodium 135 - 145 mEq/L 135 132(L) 129(L)  Potassium 3.5 - 5.1 mEq/L 4.1 4.2 4.1  Chloride 96 - 112 mEq/L 103 102 98  CO2 19 - 32 mEq/L 24 22 25   Calcium 8.4 - 10.5 mg/dL 8.4 8.3(L) 8.3(L)  Total Protein 6.0 - 8.3 g/dL 5.8(L) 5.8(L) 5.1(L)  Total Bilirubin 0.2 - 1.2 mg/dL 7.3(H) 7.1(H) 6.3(H)  Alkaline Phos 39 - 117 U/L 297(H) 318(H) 325(H)  AST 0 - 37 U/L 96(H) 109(H) 184(H)  ALT 0 - 53 U/L 31 38 56(H)   Recent evaluation includes: - CT abd/pelvis with contrast 12/24/20: cirrhosis with perihepatic ascites and splenomegaly, patent portal vein, calcified gallbladder wall versus large stone in the GB (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), diverticulosis, colonic wall thickening, anasarca - Started steroids for possible alcoholic hepatitis - Abdominal ultrasound 12/26/20: calcified gallbladder wall versus large gallstone - Not enough ascites for paracentesis 12/27/20 - Stool + for Enteroaggregative E coli, treated with Cipro - EGD 12/30/20: food in the stomach, no varices or portal hypertensive gastropathy - Colonoscopy 12/30/20: patchy colitis, diverticulosis - MRI 2/9/222: cirrhosis, splenomegaly, small volume ascites, small gastroesophageal and paraumbilical varices, mild porta hepatis adenopathy, cholelithiasis, colonic diverticulosis - Liver biopsy 01/09/21: Cirrhosis of uncertain etiology, marked cholestasis, no iron overload - suspected burnt out NASH or ASH, possible overlapping drug injury - Venogram 01/09/21: Portosystemic gradient of 55m Hg - Head CT 02/24/21 - no acute abnormality - Limited RUQ ultrasound 02/24/21: Limited evaluation. Echogenic liver.  Current Outpatient Medications on  File Prior to Visit  Medication Sig Dispense Refill  . azelastine (ASTELIN) 0.1 % nasal spray Place 1 spray into both nostrils daily. Use in each nostril as directed 30 mL 12  . dicyclomine (BENTYL) 10 MG capsule Take 1 capsule (10 mg total) by mouth 4 (four) times daily -  before meals and at bedtime. 1488capsule 2  . folic acid (FOLVITE) 1 MG tablet Take 1 tablet (1 mg total) by mouth daily. 30 tablet 1  . furosemide (LASIX) 40 MG tablet Take 1 tablet (40 mg total) by mouth daily. 90 tablet 0  . lactulose (CHRONULAC) 10 GM/15ML solution Take 45 mLs (30 g total) by mouth 3 (three) times daily. 12150 mL 0  . meclizine (ANTIVERT) 12.5 MG tablet Take 1 tablet (12.5 mg total) by mouth daily as needed for dizziness. 30 tablet 0  . nadolol (CORGARD) 20 MG tablet Take 1 tablet (20 mg total) by mouth daily. 90 tablet 0  . pantoprazole (PROTONIX) 40 MG tablet Take 1 tablet (40 mg total) by mouth 2 (two) times daily. 60 tablet 0  . rifaximin (XIFAXAN) 550 MG TABS tablet Take 1 tablet (550 mg total) by mouth 2 (two) times daily. 180 tablet 0  . spironolactone (ALDACTONE) 100 MG tablet Take 1 tablet (100 mg total) by mouth daily. 90 tablet 0   No current facility-administered  medications on file prior to visit.   Allergies  Allergen Reactions  . Hydrocodone Itching and Other (See Comments)    Noted with hydrocodone cough syrup. Itching only, no hives.  02/03/16: denied itching with recent hydrocodone cough syrup. Only one episode of itching previously.     Current Medications, Allergies, Past Medical History, Past Surgical History, Family History and Social History were reviewed in [employer] record.  Review of Systems:   Constitutional: Negative for fever, sweats, chills or weight loss.  Respiratory: + SOB.   Cardiovascular: Negative for chest pain, palpitations and leg swelling.  Gastrointestinal: See HPI.  Musculoskeletal: Negative for back pain or muscle aches.   Neurological: Negative for dizziness, headaches or paresthesias.   Physical Exam: BP 110/70   Pulse 62   Ht 5' 7"  (1.702 m)   Wt 202 lb (91.6 kg)   SpO2 97%   BMI 31.64 kg/m   General: 55 year old Hispanic male in no acute distress. Head: Normocephalic and atraumatic. Eyes: Moderate scleral icterus.  Conjunctiva pink . Ears: Normal auditory acuity. Mouth: Dentition intact. No ulcers or lesions.  Lungs: Clear throughout to auscultation. Heart: Regular rate and rhythm, no murmur. Abdomen: Soft, nontender and nondistended.  No obvious ascites.  No masses. No hepatomegaly. + Splenomegaly. Normal bowel sounds x 4 quadrants.  Rectal: Deferred.  Musculoskeletal: Symmetrical with no gross deformities. Extremities: No edema. Neurological: Alert oriented x 4. No focal deficits.  Psychological: Alert and cooperative. Normal mood and affect.  No asterixis.  Assessment and Recommendations:  4. 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 MELD-Na 23. Liver transplant evaluation with [facility] due June 20222 when abstinent from a alcohol x 6 months.  -CBC, BMP, Hepatic panel, PT/INR, AFP and Hep A total ab -Hep B suface antibody negative 10/2020, will need Hep B vaccination. Check Hep A total antibody, if negative he will require both Hep A and Hep B vaccinations at the time of his next office visit -Continue Furosemide 49m QD and Spironolactone 1299BZQD -Continue folic acid 1 mg daily -2gm low sodium diet -Surveillance abdominal MRI due 06/2021 -Abdominal MRI 01/07/2021 identified small gastroesophageal and periumbilical varices.  No esophageal varices identified per EGD 12/2020. He remains on Naldolol 259mQD. Dr. BeTarri Glenno verify if Naldolol to be discontinued. -Patient due for consult at [facility] June 2022 -Patient counseled no alcohol ever -No NSAIDs  2. Hepatic encephalopathy.  No current overt encephalopathy.  -Continue Xifaxan 55032mo bid  -Continue Lactulose 30 g p.o. 3 times daily  3. 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 Thrombocytopenia secondary to cirrhosis and splenomegaly  5. GERD/LUQ pain, unrelated to eating, worse when walking. Possibly associated with splenomegaly.  -Continue Pantoprazole 48m78m -Patient to call office if LUQ pain worsens  6. Colon cancer screening  -Colonoscopy benefits and risks discussed including risk with sedation, risk of bleeding, perforation and infection  -Patient will require a 2-day bowel prep. -Await the above lab results prior to scheduling a colonoscopy -Cardiac and pulmonary evaluation prior to pursing endoscopic evaluation secondary to dyspnea with exertion. See # 8 below.   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 High risk for cholecystectomy at this time.  8. Dyspnea with exertion. ECHO  02/24/2021  showed LVEF 60-60% and grade 1 diastolic dysfunction. No LE edema or leg pain. Past DVT. No  longer on Coumadin.  -Patient to follow up with PCP, consider pulmonary and cardiac evaluation, chest CTA  Patient to follow-up in the office in 4 weeks

## 2021-03-31 NOTE — Patient Instructions (Addendum)
If you are age 55 or younger, your body mass index should be between 19-25. Your Body mass index is 31.64 kg/m. If this is out of the aformentioned range listed, please consider follow up with your Primary Care Provider.   LABS:  Lab work has been ordered for you today. Our lab is located in the basement. Press "B" on the elevator. The lab is located at the first door on the left as you exit the elevator.  HEALTHCARE LAWS AND MY CHART RESULTS: Due to recent changes in healthcare laws, you may see the results of your imaging and laboratory studies on MyChart before your provider has had a chance to review them.   We understand that in some cases there may be results that are confusing or concerning to you. Not all laboratory results come back in the same time frame and the provider may be waiting for multiple results in order to interpret others.  Please give [place] 48 hours in order for your provider to thoroughly review all the results before contacting the office for clarification of your results.   We have scheduled you a follow up with Dr. Tarri Glenn on 05/21/21 at 8:50 AM.  Please continue taking your medications as prescribed.   It was great seeing you today! Thank you for entrusting me with your care and choosing [facility].  Noralyn Pick, CRNP   ADDENDUM: I spoke with the patient, he was advised to follow up with PCP regarding dyspnea. Recommend cardiac and pulmonary evaluation. Past history of DVT, no longer on Coumadin. Consider Chest CTA.

## 2021-04-01 LAB — AFP TUMOR MARKER: AFP-Tumor Marker: 5.5 ng/mL (ref <6.1)

## 2021-04-01 LAB — HEPATITIS A ANTIBODY, TOTAL: Hepatitis A AB,Total: REACTIVE — AB

## 2021-04-03 ENCOUNTER — Ambulatory Visit: Payer: No Typology Code available for payment source | Admitting: Registered Nurse

## 2021-04-03 ENCOUNTER — Ambulatory Visit (INDEPENDENT_AMBULATORY_CARE_PROVIDER_SITE_OTHER): Payer: No Typology Code available for payment source | Admitting: Registered Nurse

## 2021-04-03 ENCOUNTER — Other Ambulatory Visit: Payer: Self-pay

## 2021-04-03 ENCOUNTER — Encounter: Payer: Self-pay | Admitting: Registered Nurse

## 2021-04-03 VITALS — BP 110/66 | HR 89 | Temp 98.2°F | Resp 17 | Ht 67.0 in | Wt 204.0 lb

## 2021-04-03 DIAGNOSIS — R0609 Other forms of dyspnea: Secondary | ICD-10-CM

## 2021-04-03 DIAGNOSIS — R0600 Dyspnea, unspecified: Secondary | ICD-10-CM | POA: Diagnosis not present

## 2021-04-03 DIAGNOSIS — R42 Dizziness and giddiness: Secondary | ICD-10-CM

## 2021-04-03 MED ORDER — ALBUTEROL SULFATE HFA 108 (90 BASE) MCG/ACT IN AERS: 2.0000 | INHALATION_SPRAY | Freq: Four times a day (QID) | RESPIRATORY_TRACT | 0 refills | Status: DC | PRN

## 2021-04-03 NOTE — Progress Notes (Signed)
Established Patient Office Visit  Subjective:  Patient ID: Ian Hall, male    DOB: January 15, 1966  Age: 55 y.o. MRN: [id number]  CC:  Chief Complaint  Patient presents with  . Follow-up    Patient states he is here for a follow up .    HPI [place] SHELLHAMMER presents for follow up on dizziness.   Recently seen by GI Carl Best NP with Dr. Tarri Glenn office.  Labs show overall stability for the most part - still hyponatremic trending downwards, otherwise CBC reassuring and LFTs remain stable other than increase in bilirubin  Pt continues good compliance with meds as prescribed  Awaiting consult with [facility] in June of this year for transplant. Has abstained from alcohol since late 2021.   Continues to have anxiety about work and money.  Still having some dizziness and shob/doe. Recent echo on 02/24/21 shows EF 60-65% with grade 1 LV diastolic dysfunction. Has not had cardiology or pulmonary consult. Does have hx of DVT, no known hx of PE. No hemoptysis. Hx of seasonal allergies. Mild cough, not productive. Notes cough and shob worse when walking outside.   Past Medical History:  Diagnosis Date  . Anxiety   . Cirrhosis ([place])   . COVID-19     Past Surgical History:  Procedure Laterality Date  . ANKLE SURGERY Right   . IR PARACENTESIS  01/07/2021  . IR PARACENTESIS  01/30/2021  . IR TRANSCATHETER BX  01/09/2021  . IR US GUIDE VASC ACCESS RIGHT  01/09/2021  . IR VENOGRAM HEPATIC W HEMODYNAMIC EVALUATION  01/09/2021    Family History  Problem Relation Age of Onset  . Diabetes Mother   . Memory loss Mother   . Diabetes Father   . Diabetes Cousin   . Colon cancer Neg Hx     Social History   Socioeconomic History  . Marital status: Married    Spouse name: Not on file  . Number of children: 0  . Years of education: Not on file  . Highest education level: Not on file  Occupational History  . Occupation: IT sales professional: [employer]  Tobacco Use  .  Smoking status: Former Smoker    Types: Cigarettes    Quit date: 12/16/2002    Years since quitting: 18.3  . Smokeless tobacco: Never Used  Vaping Use  . Vaping Use: Never used  Substance and Sexual Activity  . Alcohol use: Not Currently    Comment: none for 3-4 years (stated 12/16/2020)  . Drug use: No  . Sexual activity: Yes    Birth control/protection: None  Other Topics Concern  . Not on file  Social History Narrative  . Not on file   Social Determinants of Health   Financial Resource Strain: Not on file  Food Insecurity: Not on file  Transportation Needs: Not on file  Physical Activity: Not on file  Stress: Not on file  Social Connections: Not on file  Intimate Partner Violence: Not on file    Outpatient Medications Prior to Visit  Medication Sig Dispense Refill  . azelastine (ASTELIN) 0.1 % nasal spray Place 1 spray into both nostrils daily. Use in each nostril as directed 30 mL 12  . dicyclomine (BENTYL) 10 MG capsule Take 1 capsule (10 mg total) by mouth 4 (four) times daily -  before meals and at bedtime. 103 capsule 2  . folic acid (FOLVITE) 1 MG tablet Take 1 tablet (1 mg total) by mouth daily.  30 tablet 1  . furosemide (LASIX) 40 MG tablet Take 1 tablet (40 mg total) by mouth daily. 90 tablet 0  . lactulose (CHRONULAC) 10 GM/15ML solution Take 45 mLs (30 g total) by mouth 3 (three) times daily. 12150 mL 0  . meclizine (ANTIVERT) 12.5 MG tablet Take 1 tablet (12.5 mg total) by mouth daily as needed for dizziness. 30 tablet 0  . nadolol (CORGARD) 20 MG tablet Take 1 tablet (20 mg total) by mouth daily. 90 tablet 0  . rifaximin (XIFAXAN) 550 MG TABS tablet Take 1 tablet (550 mg total) by mouth 2 (two) times daily. 180 tablet 0  . spironolactone (ALDACTONE) 100 MG tablet Take 1 tablet (100 mg total) by mouth daily. 90 tablet 0  . pantoprazole (PROTONIX) 40 MG tablet Take 1 tablet (40 mg total) by mouth 2 (two) times daily. 60 tablet 0   No facility-administered  medications prior to visit.    Allergies  Allergen Reactions  . Hydrocodone Itching and Other (See Comments)    Noted with hydrocodone cough syrup. Itching only, no hives.  02/03/16: denied itching with recent hydrocodone cough syrup. Only one episode of itching previously.     ROS Review of Systems  Constitutional: Negative.   HENT: Negative.   Eyes: Negative.   Respiratory: Positive for shortness of breath and wheezing.   Cardiovascular: Negative.   Gastrointestinal: Negative.   Genitourinary: Negative.   Musculoskeletal: Negative.   Skin: Negative.   Neurological: Negative.   Psychiatric/Behavioral: Negative.   All other systems reviewed and are negative.     Objective:    Physical Exam Constitutional:      General: He is not in acute distress.    Appearance: Normal appearance. He is normal weight. He is not ill-appearing, toxic-appearing or diaphoretic.  Cardiovascular:     Rate and Rhythm: Normal rate and regular rhythm.     Heart sounds: Normal heart sounds. No murmur heard. No friction rub. No gallop.   Pulmonary:     Effort: Pulmonary effort is normal. No respiratory distress.     Breath sounds: No stridor. Wheezing present. No rhonchi or rales.  Chest:     Chest wall: No tenderness.  Neurological:     General: No focal deficit present.     Mental Status: He is alert and oriented to person, place, and time. Mental status is at baseline.  Psychiatric:        Mood and Affect: Mood normal.        Behavior: Behavior normal.        Thought Content: Thought content normal.        Judgment: Judgment normal.     BP 110/66   Pulse 89   Temp 98.2 F (36.8 C) (Temporal)   Resp 17   Ht 5' 7"  (1.702 m)   Wt 204 lb (92.5 kg)   SpO2 99%   BMI 31.95 kg/m  Wt Readings from Last 3 Encounters:  04/03/21 204 lb (92.5 kg)  03/31/21 202 lb (91.6 kg)  03/19/21 201 lb 9.6 oz (91.4 kg)     There are no preventive care reminders to display for this patient.  There  are no preventive care reminders to display for this patient.  Lab Results  Component Value Date   TSH 2.160 01/14/2021   Lab Results  Component Value Date   WBC 7.8 03/31/2021   HGB 14.3 03/31/2021   HCT 41.0 03/31/2021   MCV 96.6 03/31/2021   PLT 72.0 (L)  03/31/2021   Lab Results  Component Value Date   NA 126 (L) 03/31/2021   K 4.8 03/31/2021   CO2 21 03/31/2021   GLUCOSE 110 (H) 03/31/2021   BUN 17 03/31/2021   CREATININE 1.13 03/31/2021   BILITOT 8.1 (H) 03/31/2021   ALKPHOS 303 (H) 03/31/2021   AST 118 (H) 03/31/2021   ALT 36 03/31/2021   PROT 6.5 03/31/2021   ALBUMIN 3.2 (L) 03/31/2021   CALCIUM 8.9 03/31/2021   ANIONGAP 6 03/01/2021   EGFR 104 02/04/2021   GFR 73.69 03/31/2021   Lab Results  Component Value Date   CHOL 132 01/14/2021   Lab Results  Component Value Date   HDL 12 (L) 01/14/2021   Lab Results  Component Value Date   LDLCALC 96 01/14/2021   Lab Results  Component Value Date   TRIG 96 02/25/2021   Lab Results  Component Value Date   CHOLHDL 11.0 (H) 01/14/2021   Lab Results  Component Value Date   HGBA1C 4.3 (L) 02/04/2021      Assessment & Plan:   Problem List Items Addressed This Visit   None   Visit Diagnoses    DOE (dyspnea on exertion)    -  Primary   Relevant Medications   albuterol (VENTOLIN HFA) 108 (90 Base) MCG/ACT inhaler   Other Relevant Orders   Ambulatory referral to Cardiology   Ambulatory referral to Pulmonology   Ambulatory referral to Neurology   CT Angio Chest W/Cm &/Or Wo Cm   Dizziness       Relevant Orders   Ambulatory referral to Neurology   CT Angio Chest W/Cm &/Or Wo Cm      No orders of the defined types were placed in this encounter.   Follow-up: No follow-ups on file.   PLAN  Suspect allergic asthma. Will give albuterol inhaler  As a back up to this will refer to pulmonology.   CTA to r/o chronic PE  Refer to cardiology for grade 1 diastolic dysfunction noted on echo in late  March. Doubt clinical significance with his current symptoms but want to ensure he is in optimal health for transplant.  Refer to neuro for ongoing dizziness. Positional, still seems like BPPV, but given his liver dysfunction and hx of encephalopathy would like consult  Patient encouraged to call clinic with any questions, comments, or concerns.  Maximiano Coss, NP

## 2021-04-03 NOTE — Patient Instructions (Addendum)
Mr. Nabor -   Always great to see you  Let's try the albuterol inhaler. This should help with your breathing. Continue with the nasal spray for ear pressure.  As a back up to this, I have placed a few orders: Refer to cardiology - I doubt your heart function is affecting your breathing, but it wouldn't be a bad idea to ensure your heart is healthy before the transplant Refer to pulmonology - if the albuterol inhaler is not effective, this will be our next step CT angiogram - this will help rule out a clot in your lungs. I doubt this is happening but with your history and history of a clot, we need to be sure.  In addition, I'll send a message to the transplant team at [facility] to remind them that you're coming up in 6 months of abstaining from alcohol. They should be giving you a call soon.  Thank you  Rich     If you have lab work done today you will be contacted with your lab results within the next 2 weeks.  If you have not heard from [place] then please contact us. The fastest way to get your results is to register for My Chart.   IF you received an x-ray today, you will receive an invoice from [facility]. Please contact [facility] at [phone] with questions or concerns regarding your invoice.   IF you received labwork today, you will receive an invoice from [place]. Please contact [facility] at [phone] with questions or concerns regarding your invoice.   Our billing staff will not be able to assist you with questions regarding bills from these companies.  You will be contacted with the lab results as soon as they are available. The fastest way to get your results is to activate your My Chart account. Instructions are located on the last page of this paperwork. If you have not heard from [place] regarding the results in 2 weeks, please contact this office.

## 2021-04-06 ENCOUNTER — Other Ambulatory Visit: Payer: Self-pay

## 2021-04-06 ENCOUNTER — Encounter: Payer: Self-pay | Admitting: Neurology

## 2021-04-06 DIAGNOSIS — K7460 Unspecified cirrhosis of liver: Secondary | ICD-10-CM

## 2021-04-06 DIAGNOSIS — K7290 Hepatic failure, unspecified without coma: Secondary | ICD-10-CM

## 2021-04-06 NOTE — Progress Notes (Signed)
[name], I called the patient. He was instructed to stop Nadolol. He will continue the same diuretic doses. He needs a repeat BMP in 2 weeks. Pls enter a lab order for the BMP. He is aware to return to out lab in 2 weeks to have done. Thx

## 2021-04-08 ENCOUNTER — Telehealth: Payer: Self-pay | Admitting: Nurse Practitioner

## 2021-04-08 ENCOUNTER — Telehealth: Payer: Self-pay | Admitting: Registered Nurse

## 2021-04-08 NOTE — Telephone Encounter (Signed)
Lm on vm for [facility] to return call with the fax number and reason for the request.

## 2021-04-08 NOTE — Telephone Encounter (Signed)
Inbound call from Gaylord Shih, [place] [facility] nurse case manager. Requesting latest office notes, labs, and medication list for patient. Direct number is [phone]

## 2021-04-08 NOTE — Telephone Encounter (Signed)
..  Home Health Certification or Plan of Care Tracking  Is this a Certification or Plan of [place] Order Number:  [id number] Has charge sheet been attached? yes  Where has form been placed:  Up front in Richard's bin

## 2021-04-09 ENCOUNTER — Telehealth: Payer: Self-pay | Admitting: Registered Nurse

## 2021-04-09 NOTE — Telephone Encounter (Signed)
I have received patient paperwork from up front , and put it in [place] sign folder to review and sign.

## 2021-04-09 NOTE — Telephone Encounter (Signed)
..  Home Health Certification or Plan of Care Tracking  Is this a Certification or Plan of Care?Yes  [place] Agency:  [facility] Order Number:   [id number]  Has charge sheet been attached? Yes  Where has form been placed:  In Richard's bin up front

## 2021-04-09 NOTE — Telephone Encounter (Signed)
Received a fax from Leanord Asal, RN case Freight forwarder at [employer]. Requested documents have been faxed back to [facility] at [phone].

## 2021-04-10 ENCOUNTER — Other Ambulatory Visit: Payer: Self-pay

## 2021-04-10 ENCOUNTER — Ambulatory Visit
Admission: RE | Admit: 2021-04-10 | Discharge: 2021-04-10 | Disposition: A | Discharge status: Home / Self Care | Payer: No Typology Code available for payment source | Source: Ambulatory Visit | Attending: Registered Nurse | Admitting: Registered Nurse

## 2021-04-10 ENCOUNTER — Other Ambulatory Visit (INDEPENDENT_AMBULATORY_CARE_PROVIDER_SITE_OTHER): Payer: No Typology Code available for payment source

## 2021-04-10 DIAGNOSIS — K7460 Unspecified cirrhosis of liver: Secondary | ICD-10-CM

## 2021-04-10 DIAGNOSIS — K7290 Hepatic failure, unspecified without coma: Secondary | ICD-10-CM

## 2021-04-10 DIAGNOSIS — R42 Dizziness and giddiness: Secondary | ICD-10-CM

## 2021-04-10 DIAGNOSIS — R0609 Other forms of dyspnea: Secondary | ICD-10-CM

## 2021-04-10 LAB — BASIC METABOLIC PANEL
BUN: 19 mg/dL (ref 6–23)
CO2: 20 mEq/L (ref 19–32)
Calcium: 8.5 mg/dL (ref 8.4–10.5)
Chloride: 96 mEq/L (ref 96–112)
Creatinine, Ser: 1.22 mg/dL (ref 0.40–1.50)
GFR: 67.21 mL/min (ref >60.00)
Glucose, Bld: 137 mg/dL — ABNORMAL HIGH (ref 70–99)
Potassium: 4.3 mEq/L (ref 3.5–5.1)
Sodium: 127 mEq/L — ABNORMAL LOW (ref 135–145)

## 2021-04-10 MED ORDER — IOHEXOL 350 MG/ML SOLN
80.0000 mL | Freq: Once | INTRAVENOUS | Status: AC | PRN
  Administered 2021-04-10: 80 mL via INTRAVENOUS

## 2021-04-10 NOTE — Telephone Encounter (Signed)
Faxed and sent to [place]

## 2021-04-13 ENCOUNTER — Other Ambulatory Visit: Payer: Self-pay

## 2021-04-13 DIAGNOSIS — K7290 Hepatic failure, unspecified without coma: Secondary | ICD-10-CM

## 2021-04-16 ENCOUNTER — Other Ambulatory Visit: Payer: Self-pay

## 2021-04-16 ENCOUNTER — Telehealth: Payer: Self-pay | Admitting: Registered Nurse

## 2021-04-16 MED ORDER — PANTOPRAZOLE SODIUM 40 MG PO TBEC
40.0000 mg | DELAYED_RELEASE_TABLET | Freq: Two times a day (BID) | ORAL | 0 refills | Status: DC
Start: 2021-04-16 — End: 2021-05-11

## 2021-04-16 NOTE — Telephone Encounter (Signed)
..  Medication Refills  Last OV:  Medication:  Pantaprozole Pharmacy:  [facility] [place]  Let patient know to contact pharmacy at the end of the day to make sure medication is ready.   Please notify patient to allow 48-72 hours to process.  Encourage patient to contact the pharmacy for refills or they can request refills through [place] out below:   Last refill:  QTY:  Refill Date:    Other Comments:   Okay for refill?  Please advise.

## 2021-04-16 NOTE — Telephone Encounter (Signed)
Medication sent to patient's pharmacy.

## 2021-04-17 ENCOUNTER — Encounter (HOSPITAL_COMMUNITY): Payer: Self-pay

## 2021-04-17 ENCOUNTER — Other Ambulatory Visit: Payer: Self-pay

## 2021-04-17 ENCOUNTER — Ambulatory Visit: Payer: No Typology Code available for payment source | Admitting: Cardiovascular Disease

## 2021-04-17 ENCOUNTER — Encounter: Payer: Self-pay | Admitting: Cardiovascular Disease

## 2021-04-17 ENCOUNTER — Telehealth: Payer: Self-pay

## 2021-04-17 ENCOUNTER — Other Ambulatory Visit (INDEPENDENT_AMBULATORY_CARE_PROVIDER_SITE_OTHER): Payer: No Typology Code available for payment source

## 2021-04-17 ENCOUNTER — Ambulatory Visit (INDEPENDENT_AMBULATORY_CARE_PROVIDER_SITE_OTHER): Payer: No Typology Code available for payment source | Admitting: Cardiovascular Disease

## 2021-04-17 ENCOUNTER — Observation Stay (HOSPITAL_COMMUNITY)
Admission: EM | Admit: 2021-04-17 | Discharge: 2021-04-17 | Disposition: A | Discharge status: Home / Self Care | Payer: No Typology Code available for payment source | Source: Home / Self Care | Attending: Emergency Medicine | Admitting: Emergency Medicine

## 2021-04-17 DIAGNOSIS — K7290 Hepatic failure, unspecified without coma: Secondary | ICD-10-CM | POA: Diagnosis not present

## 2021-04-17 DIAGNOSIS — I82591 Chronic embolism and thrombosis of other specified deep vein of right lower extremity: Secondary | ICD-10-CM | POA: Diagnosis not present

## 2021-04-17 DIAGNOSIS — Z8616 Personal history of COVID-19: Secondary | ICD-10-CM | POA: Insufficient documentation

## 2021-04-17 DIAGNOSIS — R531 Weakness: Secondary | ICD-10-CM | POA: Diagnosis not present

## 2021-04-17 DIAGNOSIS — K7460 Unspecified cirrhosis of liver: Secondary | ICD-10-CM | POA: Diagnosis not present

## 2021-04-17 DIAGNOSIS — Z87891 Personal history of nicotine dependence: Secondary | ICD-10-CM | POA: Insufficient documentation

## 2021-04-17 DIAGNOSIS — I82409 Acute embolism and thrombosis of unspecified deep veins of unspecified lower extremity: Secondary | ICD-10-CM | POA: Insufficient documentation

## 2021-04-17 DIAGNOSIS — E870 Hyperosmolality and hypernatremia: Secondary | ICD-10-CM

## 2021-04-17 DIAGNOSIS — E871 Hypo-osmolality and hyponatremia: Secondary | ICD-10-CM | POA: Diagnosis present

## 2021-04-17 LAB — URINALYSIS, ROUTINE W REFLEX MICROSCOPIC
Bilirubin Urine: NEGATIVE
Glucose, UA: NEGATIVE mg/dL
Ketones, ur: NEGATIVE mg/dL
Leukocytes,Ua: NEGATIVE
Nitrite: NEGATIVE
Protein, ur: NEGATIVE mg/dL
Specific Gravity, Urine: 1.014 (ref 1.005–1.030)
pH: 5 (ref 5.0–8.0)

## 2021-04-17 LAB — CBC WITH DIFFERENTIAL/PLATELET
Abs Immature Granulocytes: 0.05 10*3/uL (ref 0.00–0.07)
Basophils Absolute: 0.1 10*3/uL (ref 0.0–0.1)
Basophils Relative: 1 %
Eosinophils Absolute: 0.1 10*3/uL (ref 0.0–0.5)
Eosinophils Relative: 2 %
HCT: 34.9 % — ABNORMAL LOW (ref 39.0–52.0)
Hemoglobin: 12.4 g/dL — ABNORMAL LOW (ref 13.0–17.0)
Immature Granulocytes: 1 %
Lymphocytes Relative: 17 %
Lymphs Abs: 1.1 10*3/uL (ref 0.7–4.0)
MCH: 33.3 pg (ref 26.0–34.0)
MCHC: 35.5 g/dL (ref 30.0–36.0)
MCV: 93.8 fL (ref 80.0–100.0)
Monocytes Absolute: 0.8 10*3/uL (ref 0.1–1.0)
Monocytes Relative: 13 %
Neutro Abs: 4.1 10*3/uL (ref 1.7–7.7)
Neutrophils Relative %: 66 %
Platelets: 61 10*3/uL — ABNORMAL LOW (ref 150–400)
RBC: 3.72 MIL/uL — ABNORMAL LOW (ref 4.22–5.81)
RDW: 14.6 % (ref 11.5–15.5)
WBC: 6.2 10*3/uL (ref 4.0–10.5)
nRBC: 0 % (ref 0.0–0.2)

## 2021-04-17 LAB — I-STAT CHEM 8, ED
BUN: 22 mg/dL — ABNORMAL HIGH (ref 6–20)
Calcium, Ion: 1.18 mmol/L (ref 1.15–1.40)
Chloride: 93 mmol/L — ABNORMAL LOW (ref 98–111)
Creatinine, Ser: 1.2 mg/dL (ref 0.61–1.24)
Glucose, Bld: 122 mg/dL — ABNORMAL HIGH (ref 70–99)
HCT: 35 % — ABNORMAL LOW (ref 39.0–52.0)
Hemoglobin: 11.9 g/dL — ABNORMAL LOW (ref 13.0–17.0)
Potassium: 4.8 mmol/L (ref 3.5–5.1)
Sodium: 124 mmol/L — ABNORMAL LOW (ref 135–145)
TCO2: 21 mmol/L — ABNORMAL LOW (ref 22–32)

## 2021-04-17 LAB — BASIC METABOLIC PANEL
BUN: 16 mg/dL (ref 6–23)
CO2: 19 mEq/L (ref 19–32)
Calcium: 8.7 mg/dL (ref 8.4–10.5)
Chloride: 91 mEq/L — ABNORMAL LOW (ref 96–112)
Creatinine, Ser: 1.04 mg/dL (ref 0.40–1.50)
GFR: 81.38 mL/min (ref >60.00)
Glucose, Bld: 104 mg/dL — ABNORMAL HIGH (ref 70–99)
Potassium: 4.6 mEq/L (ref 3.5–5.1)
Sodium: 119 mEq/L — CL (ref 135–145)

## 2021-04-17 LAB — COMPREHENSIVE METABOLIC PANEL
ALT: 48 U/L — ABNORMAL HIGH (ref 0–44)
AST: 152 U/L — ABNORMAL HIGH (ref 15–41)
Albumin: 2.7 g/dL — ABNORMAL LOW (ref 3.5–5.0)
Alkaline Phosphatase: 347 U/L — ABNORMAL HIGH (ref 38–126)
Anion gap: 8 (ref 5–15)
BUN: 20 mg/dL (ref 6–20)
CO2: 19 mmol/L — ABNORMAL LOW (ref 22–32)
Calcium: 8.5 mg/dL — ABNORMAL LOW (ref 8.9–10.3)
Chloride: 96 mmol/L — ABNORMAL LOW (ref 98–111)
Creatinine, Ser: 1.08 mg/dL (ref 0.61–1.24)
GFR, Estimated: >60 mL/min (ref >60)
Glucose, Bld: 102 mg/dL — ABNORMAL HIGH (ref 70–99)
Potassium: 4.3 mmol/L (ref 3.5–5.1)
Sodium: 123 mmol/L — ABNORMAL LOW (ref 135–145)
Total Bilirubin: 8.1 mg/dL — ABNORMAL HIGH (ref 0.3–1.2)
Total Protein: 6.1 g/dL — ABNORMAL LOW (ref 6.5–8.1)

## 2021-04-17 LAB — OSMOLALITY: Osmolality: 262 mOsm/kg — ABNORMAL LOW (ref 275–295)

## 2021-04-17 LAB — I-STAT BETA HCG BLOOD, ED (MC, WL, AP ONLY): I-stat hCG, quantitative: <5 m[IU]/mL (ref <5)

## 2021-04-17 LAB — OSMOLALITY, URINE: Osmolality, Ur: 466 mOsm/kg (ref 300–900)

## 2021-04-17 LAB — SODIUM, URINE, RANDOM: Sodium, Ur: 29 mmol/L

## 2021-04-17 NOTE — Telephone Encounter (Signed)
Pt immediately returned my call as I was documenting this note. Advised pt of his critical lab result and advised Dr. Tarri Glenn would like him to proceed to the hospital for treatment. Pt responded, "that's a lot of money." Advised pt that I understood his concern but this is something that needs addressed and treated ASAP. Strongly encouraged that he proceed to the nearest hospital for treatment. Pt expressed understanding and verbalized he will proceed to [facility] as soon as we hang up. Advised I am planning to forward this message to Dr. Tarri Glenn to make her aware. Verbalized acceptance and understanding.

## 2021-04-17 NOTE — ED Notes (Signed)
ISTAT results signed off by Dr.Yao

## 2021-04-17 NOTE — Telephone Encounter (Signed)
ok 

## 2021-04-17 NOTE — Discharge Instructions (Signed)
Stop taking lasix and aldactone now  You need to see a GI doctor on Monday for repeat BMP  See your doctor   Return to ER if you have a seizure, dizziness, abdominal pain, vomiting.

## 2021-04-17 NOTE — ED Provider Notes (Signed)
[facility] DEPT Provider Note   CSN: [id number] Arrival date & time: 04/17/21  1251     History Chief Complaint  Patient presents with  . Abnormal Lab    Ian Hall is a 55 y.o. male hx of COVID, cirrhosis, here presenting with abnormal lab.  Patient went to see cardiology today to follow-up on his Doppler study.  He apparently had a ultrasound that showed small DVT and a negative study in April.  Patient has underlying cirrhosis so was thought not to be a anticoagulation candidate.  He had outpatient labs done today and showed a sodium of 119 and sent in for further evaluation.  Patient denies being lightheaded or dizzy or seizure-like activity.  Patient has a history of cirrhosis but no longer drinks alcohol right now.  Patient states that he is weight is stable.  Denies any chest pain or shortness of breath.  Denies any leg swelling  The history is provided by the patient.       Past Medical History:  Diagnosis Date  . Anxiety   . Cirrhosis ([place])   . COVID-19     Patient Active Problem List   Diagnosis Date Noted  . DVT (deep venous thrombosis) ([place]) 04/17/2021  . Alcoholic cirrhosis of liver without ascites ([place])   . Acute deep vein thrombosis (DVT) of right tibial vein ([place]) 02/12/2021  . Hyponatremia 02/06/2021  . Dyspnea on exertion   . Abdominal swelling, generalized   . Ascites due to alcoholic cirrhosis ([place])   . Jaundice   . 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 Thrombocytopenia ([place]) 01/07/2021  . Alcohol abuse, episodic 01/07/2021  . Alcoholic hepatitis 24/26/8341  . Cirrhosis of liver with ascites ([place])   . Hyperbilirubinemia   . Alcoholic hepatitis with ascites 12/26/2020  . Hepatitis, alcoholic, acute 96/22/2979  . 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 ANKLE SURGERY Right   . IR PARACENTESIS  01/07/2021  . IR PARACENTESIS  01/30/2021  . IR TRANSCATHETER BX  01/09/2021  . IR  US GUIDE VASC ACCESS RIGHT  01/09/2021  . IR VENOGRAM HEPATIC W HEMODYNAMIC EVALUATION  01/09/2021       Family History  Problem Relation Age of Onset  . Diabetes Mother   . Memory loss Mother   . Diabetes Father   . Diabetes Cousin   . Colon cancer Neg Hx     Social History   Tobacco Use  . Smoking status: Former Smoker    Types: Cigarettes    Quit date: 12/16/2002    Years since quitting: 18.3  . Smokeless tobacco: Never Used  Vaping Use  . Vaping Use: Never used  Substance Use Topics  . Alcohol use: Not Currently    Comment: none for 3-4 years (stated 12/16/2020)  . Drug use: No    Home Medications Prior to Admission medications   Medication Sig Start Date End Date Taking? Authorizing Provider  albuterol (VENTOLIN HFA) 108 (90 Base) MCG/ACT inhaler Inhale 2 puffs into the lungs every 6 (six) hours as needed for wheezing or shortness of breath. 04/03/21  Yes Maximiano Coss, NP  azelastine (ASTELIN) 0.1 % nasal spray Place 1 spray into both nostrils daily. Use in each nostril as directed 03/19/21  Yes Maximiano Coss, NP  folic acid (FOLVITE) 1 MG tablet Take 1 tablet (1 mg total) by mouth daily. 03/26/21  Yes Maximiano Coss, NP  furosemide (LASIX) 40 MG tablet Take 1  tablet (40 mg total) by mouth daily. 03/01/21 05/30/21 Yes [place] Eric J, DO  lactulose (CHRONULAC) 10 GM/15ML solution Take 45 mLs (30 g total) by mouth 3 (three) times daily. 03/01/21 05/30/21 Yes [place] Eric J, DO  nadolol (CORGARD) 20 MG tablet Take 1 tablet (20 mg total) by mouth daily. 03/10/21  Yes Maximiano Coss, NP  rifaximin (XIFAXAN) 550 MG TABS tablet Take 1 tablet (550 mg total) by mouth 2 (two) times daily. 03/01/21 05/30/21 Yes [place] Donnamarie Poag, DO  spironolactone (ALDACTONE) 100 MG tablet Take 1 tablet (100 mg total) by mouth daily. 03/01/21 05/30/21 Yes [place] Eric J, DO  dicyclomine (BENTYL) 10 MG capsule Take 1 capsule (10 mg total) by mouth 4 (four) times daily -  before meals and at bedtime. Patient not  taking: No sig reported 12/16/20   Thornton [place], MD  meclizine (ANTIVERT) 12.5 MG tablet Take 1 tablet (12.5 mg total) by mouth daily as needed for dizziness. Patient not taking: No sig reported 03/19/21   Maximiano Coss, NP  pantoprazole (PROTONIX) 40 MG tablet Take 1 tablet (40 mg total) by mouth 2 (two) times daily. 04/16/21 05/16/21  Maximiano Coss, NP    Allergies    Hydrocodone  Review of Systems   Review of Systems  Respiratory: Negative for shortness of breath.   Cardiovascular: Negative for chest pain and leg swelling.  All other systems reviewed and are negative.   Physical Exam Updated Vital Signs BP (!) 141/76   Pulse 86   Temp 98.2 F (36.8 C) (Oral)   Resp 17   Ht 5' 7"  (1.702 m)   Wt 92.6 kg   SpO2 99%   BMI 31.96 kg/m   Physical Exam Vitals and nursing note reviewed.  HENT:     Head:     Comments: Slightly jaundiced    Nose: Nose normal.     Mouth/Throat:     Mouth: Mucous membranes are dry.  Eyes:     Extraocular Movements: Extraocular movements intact.  Cardiovascular:     Rate and Rhythm: Normal rate and regular rhythm.     Pulses: Normal pulses.     Heart sounds: Normal heart sounds.  Pulmonary:     Effort: Pulmonary effort is normal.     Breath sounds: Normal breath sounds.  Abdominal:     General: Abdomen is flat.     Palpations: Abdomen is soft.  Musculoskeletal:        General: Normal range of motion.     Cervical back: Normal range of motion and neck supple.  Skin:    General: Skin is warm.     Capillary Refill: Capillary refill takes less than 2 seconds.  Neurological:     General: No focal deficit present.     Mental Status: He is alert and oriented to person, place, and time.  Psychiatric:        Mood and Affect: Mood normal.        Behavior: Behavior normal.     ED Results / Procedures / Treatments   Labs (all labs ordered are listed, but only abnormal results are displayed) Labs Reviewed  CBC WITH  DIFFERENTIAL/PLATELET - Abnormal; Notable for the following components:      Result Value   RBC 3.72 (*)    Hemoglobin 12.4 (*)    HCT 34.9 (*)    Platelets 61 (*)    All other components within normal limits  COMPREHENSIVE METABOLIC PANEL - Abnormal; Notable for the following components:  Sodium 123 (*)    Chloride 96 (*)    CO2 19 (*)    Glucose, Bld 102 (*)    Calcium 8.5 (*)    Total Protein 6.1 (*)    Albumin 2.7 (*)    AST 152 (*)    ALT 48 (*)    Alkaline Phosphatase 347 (*)    Total Bilirubin 8.1 (*)    All other components within normal limits  URINALYSIS, ROUTINE W REFLEX MICROSCOPIC - Abnormal; Notable for the following components:   Color, Urine AMBER (*)    Hgb urine dipstick SMALL (*)    Bacteria, UA RARE (*)    All other components within normal limits  I-STAT CHEM 8, ED - Abnormal; Notable for the following components:   Sodium 124 (*)    Chloride 93 (*)    BUN 22 (*)    Glucose, Bld 122 (*)    TCO2 21 (*)    Hemoglobin 11.9 (*)    HCT 35.0 (*)    All other components within normal limits  SODIUM, URINE, RANDOM  OSMOLALITY  OSMOLALITY, URINE  I-STAT BETA HCG BLOOD, ED ([facility], [facility], [facility] ONLY)    EKG EKG Interpretation  Date/Time:  Friday Apr 17 2021 14:10:52 EDT Ventricular Rate:  86 PR Interval:  150 QRS Duration: 96 QT Interval:  392 QTC Calculation: 469 R Axis:   38 Text Interpretation: Sinus rhythm Abnormal R-wave progression, early transition No significant change since last tracing Confirmed by Wandra Arthurs [phone]) on 04/17/2021 2:19:02 PM   Radiology No results found.  Procedures Procedures   Medications Ordered in ED Medications - No data to display  ED Course  I have reviewed the triage vital signs and the nursing notes.  Pertinent labs & imaging results that were available during my care of the patient were reviewed by me and considered in my medical decision making (see chart for details).    MDM Rules/Calculators/A&P                          KENLY XIAO is a 55 y.o. male here presenting with hyponatremia.  Patient appears euvolemic.  His baseline sodium is around 127 it was 119 today.  Wonder if this is a lab error.  We will repeat sodium level and get serum and urine osms.   4:33 PM Patient's repeat sodium is 123.  I talked to the admitting hospitalist who initially accepted the patient. He then contacted GI and discussed with Dr. Modena Nunnery.  He states that since his sodium is above 120, he can stop Aldactone and Lasix and they will follow-up with him on Monday for repeat BMP.  I updated the patient.  Stable for discharge  Final Clinical Impression(s) / ED Diagnoses Final diagnoses:  None    Rx / DC Orders ED Discharge Orders    None       Drenda Freeze, MD 04/17/21 [phone]

## 2021-04-17 NOTE — Assessment & Plan Note (Signed)
Mr. Ian Hall was referred to me by Lewis Moccasin, NP for evaluation of DVT.  He did have a negative venous Doppler study performed 12/27/2020.  Doppler performed 02/08/2021 showed a short segment right posterior tibial vein DVT of indeterminate age and finally his most recent Doppler study performed 02/27/2021 showed resolution of this with no evidence of DVT.  He is totally asymptomatic.  There is no swelling or discomfort.  He is not a candidate for oral anticoagulation given his cirrhosis.  No further work-up is indicated at this time.

## 2021-04-17 NOTE — Telephone Encounter (Signed)
-----   Message from Alfredia Ferguson, PA-C sent at 04/17/2021  4:30 PM EDT ----- Regarding: Ian Hall- Labs Ian Hall was evaluated in the ER today, sodium up to 124, creatinine stable actually improved at 1.02  Urine NA 29  He will be discharged home and not admitted Patient instructed to stay off of Lasix and off Aldactone through Monday until he gets repeat labs  Please call patient Monday morning and have him come in for labs on Monday or at the latest Tuesday morning-we will need  BMET-t can put in order today -thanks

## 2021-04-17 NOTE — ED Triage Notes (Signed)
Patient states he went to his appointment today for blood work and was notified to come to the ED for a Na++ level-119.

## 2021-04-17 NOTE — Telephone Encounter (Signed)
At [facility], PA-C's request, orders placed for pt to have labs drawn on Monday. Will plan to call pt to follow up per her request on Monday

## 2021-04-17 NOTE — Telephone Encounter (Signed)
Received call from Lab indicating critical Na+ level of 119. Provided results to Dr. Tarri Glenn. Advised pt will need to proceed to ED. Called pt to make him aware of Dr. Tarri Glenn orders. LVM on cell # requesting returned call. Unable to reach pt on work #. Will continue efforts to reach pt.

## 2021-04-17 NOTE — Progress Notes (Signed)
04/17/2021 Ian Hall   1966/04/09  [id number]  Primary Physician Ian Coss, NP Primary Cardiologist: Ian Harp MD Ian Hall, [place]  HPI:  Ian Hall is a 55 y.o. married African-American male father of 2 stepchildren, grandfather of 2 grandchildren who works Agricultural engineer for [employer].  He was referred by Ian Ducking, NP for evaluation of DVT.  He has a history of remote tobacco abuse having quit 12 years ago.  He does have a history of alcoholic cirrhosis and currently does not drink.  He has no other cardiac risk factors.  Never had a heart attack or stroke.  Denies chest pain or shortness of breath.  He does have a normal 2D echocardiogram.  He had Doppler studies performed of his lower extremities in January/March and April of this year that were normal in January and April however in March she had a short segment right posterior tibial vein DVT of indeterminate age.  He is completely asymptomatic with regards to this.   Current Meds  Medication Sig  . albuterol (VENTOLIN HFA) 108 (90 Base) MCG/ACT inhaler Inhale 2 puffs into the lungs every 6 (six) hours as needed for wheezing or shortness of breath.  [name] azelastine (ASTELIN) 0.1 % nasal spray Place 1 spray into both nostrils daily. Use in each nostril as directed  . dicyclomine (BENTYL) 10 MG capsule Take 1 capsule (10 mg total) by mouth 4 (four) times daily -  before meals and at bedtime.  . folic acid (FOLVITE) 1 MG tablet Take 1 tablet (1 mg total) by mouth daily.  . furosemide (LASIX) 40 MG tablet Take 1 tablet (40 mg total) by mouth daily.  [name] lactulose (CHRONULAC) 10 GM/15ML solution Take 45 mLs (30 g total) by mouth 3 (three) times daily.  . meclizine (ANTIVERT) 12.5 MG tablet Take 1 tablet (12.5 mg total) by mouth daily as needed for dizziness.  . nadolol (CORGARD) 20 MG tablet Take 1 tablet (20 mg total) by mouth daily.  . pantoprazole (PROTONIX) 40 MG tablet Take 1 tablet (40 mg total) by mouth 2 (two)  times daily.  . rifaximin (XIFAXAN) 550 MG TABS tablet Take 1 tablet (550 mg total) by mouth 2 (two) times daily.  [name] spironolactone (ALDACTONE) 100 MG tablet Take 1 tablet (100 mg total) by mouth daily.     Allergies  Allergen Reactions  . Hydrocodone Itching and Other (See Comments)    Noted with hydrocodone cough syrup. Itching only, no hives.  02/03/16: denied itching with recent hydrocodone cough syrup. Only one episode of itching previously.     Social History   Socioeconomic History  . Marital status: Married    Spouse name: Not on file  . Number of children: 0  . Years of education: Not on file  . Highest education level: Not on file  Occupational History  . Occupation: IT sales professional: [employer]  Tobacco Use  . Smoking status: Former Smoker    Types: Cigarettes    Quit date: 12/16/2002    Years since quitting: 18.3  . Smokeless tobacco: Never Used  Vaping Use  . Vaping Use: Never used  Substance and Sexual Activity  . Alcohol use: Not Currently    Comment: none for 3-4 years (stated 12/16/2020)  . Drug use: No  . Sexual activity: Yes    Birth control/protection: None  Other Topics Concern  . Not on file  Social History Narrative  . Not  on file   Social Determinants of Health   Financial Resource Strain: Not on file  Food Insecurity: Not on file  Transportation Needs: Not on file  Physical Activity: Not on file  Stress: Not on file  Social Connections: Not on file  Intimate Partner Violence: Not on file     Review of Systems: General: negative for chills, fever, night sweats or weight changes.  Cardiovascular: negative for chest pain, dyspnea on exertion, edema, orthopnea, palpitations, paroxysmal nocturnal dyspnea or shortness of breath Dermatological: negative for rash Respiratory: negative for cough or wheezing Urologic: negative for hematuria Abdominal: negative for nausea, vomiting, diarrhea, bright red blood per rectum, melena,  or hematemesis Neurologic: negative for visual changes, syncope, or dizziness All other systems reviewed and are otherwise negative except as noted above.    Blood pressure 115/71, pulse 73, height 5' 7"  (1.702 m), weight 204 lb 0.6 oz (92.6 kg), SpO2 99 %.  General appearance: alert and no distress Neck: no adenopathy, no carotid bruit, no JVD, supple, symmetrical, trachea midline and thyroid not enlarged, symmetric, no tenderness/mass/nodules Lungs: clear to auscultation bilaterally Heart: regular rate and rhythm, S1, S2 normal, no murmur, click, rub or gallop Extremities: extremities normal, atraumatic, no cyanosis or edema Pulses: 2+ and symmetric Skin: Skin color, texture, turgor normal. No rashes or lesions Neurologic: Alert and oriented X 3, normal strength and tone. Normal symmetric reflexes. Normal coordination and gait  EKG sinus rhythm at 73 without ST or T wave changes.  I personally reviewed this EKG.  ASSESSMENT AND PLAN:   DVT (deep venous thrombosis) [facility]) Ian Hall was referred to me by Ian Moccasin, NP for evaluation of DVT.  He did have a negative venous Doppler study performed 12/27/2020.  Doppler performed 02/08/2021 showed a short segment right posterior tibial vein DVT of indeterminate age and finally his most recent Doppler study performed 02/27/2021 showed resolution of this with no evidence of DVT.  He is totally asymptomatic.  There is no swelling or discomfort.  He is not a candidate for oral anticoagulation given his cirrhosis.  No further work-up is indicated at this time.      Ian Harp MD FACP,FACC,FAHA, FSCAI 04/17/2021 9:00 AM

## 2021-04-17 NOTE — Telephone Encounter (Signed)
    Called above #'s. Mother did not answer call. Spouse did not answer on home # and unable to LVM on cell #. Again called pt on cell # and LVM requesting he return my call ASAP

## 2021-04-17 NOTE — Patient Instructions (Signed)
Medication Instructions:  Your physician recommends that you continue on your current medications as directed. Please refer to the Current Medication list given to you today.  *If you need a refill on your cardiac medications before your next appointment, please call your pharmacy*   Follow-Up: At [facility], you and your health needs are our priority.  As part of our continuing mission to provide you with exceptional heart care, we have created designated Provider Care Teams.  These Care Teams include your primary Cardiologist (physician) and Advanced Practice Providers (APPs -  Physician Assistants and Nurse Practitioners) who all work together to provide you with the care you need, when you need it.  We recommend signing up for the patient portal called "MyChart".  Sign up information is provided on this After Visit Summary.  MyChart is used to connect with patients for Virtual Visits (Telemedicine).  Patients are able to view lab/test results, encounter notes, upcoming appointments, etc.  Non-urgent messages can be sent to your provider as well.   To learn more about what you can do with MyChart, go to [email or web address].    Your next appointment:   No future appointments made at this time. We will see you on an as needed basis.  Provider:   Quay Burow, MD

## 2021-04-20 ENCOUNTER — Other Ambulatory Visit (INDEPENDENT_AMBULATORY_CARE_PROVIDER_SITE_OTHER): Payer: No Typology Code available for payment source

## 2021-04-20 ENCOUNTER — Encounter (HOSPITAL_COMMUNITY): Payer: Self-pay | Admitting: Emergency Medicine

## 2021-04-20 ENCOUNTER — Other Ambulatory Visit: Payer: Self-pay

## 2021-04-20 ENCOUNTER — Inpatient Hospital Stay (HOSPITAL_COMMUNITY)
Admission: EM | Admit: 2021-04-20 | Discharge: 2021-04-23 | DRG: 641 | Disposition: A | Discharge status: Home / Self Care | Payer: No Typology Code available for payment source | Attending: Family Medicine | Admitting: Family Medicine

## 2021-04-20 DIAGNOSIS — R7989 Other specified abnormal findings of blood chemistry: Secondary | ICD-10-CM | POA: Diagnosis present

## 2021-04-20 DIAGNOSIS — D638 Anemia in other chronic diseases classified elsewhere: Secondary | ICD-10-CM | POA: Diagnosis present

## 2021-04-20 DIAGNOSIS — Z86718 Personal history of other venous thrombosis and embolism: Secondary | ICD-10-CM

## 2021-04-20 DIAGNOSIS — E669 Obesity, unspecified: Secondary | ICD-10-CM | POA: Diagnosis present

## 2021-04-20 DIAGNOSIS — Z6834 Body mass index (BMI) 34.0-34.9, adult: Secondary | ICD-10-CM

## 2021-04-20 DIAGNOSIS — Z885 Allergy status to narcotic agent status: Secondary | ICD-10-CM | POA: Diagnosis not present

## 2021-04-20 DIAGNOSIS — K7011 Alcoholic hepatitis with ascites: Secondary | ICD-10-CM | POA: Diagnosis present

## 2021-04-20 DIAGNOSIS — F1010 Alcohol abuse, uncomplicated: Secondary | ICD-10-CM | POA: Diagnosis present

## 2021-04-20 DIAGNOSIS — R531 Weakness: Secondary | ICD-10-CM | POA: Diagnosis present

## 2021-04-20 DIAGNOSIS — Z87891 Personal history of nicotine dependence: Secondary | ICD-10-CM

## 2021-04-20 DIAGNOSIS — D696 Thrombocytopenia, unspecified: Secondary | ICD-10-CM | POA: Diagnosis present

## 2021-04-20 DIAGNOSIS — K7030 Alcoholic cirrhosis of liver without ascites: Secondary | ICD-10-CM | POA: Diagnosis present

## 2021-04-20 DIAGNOSIS — K7010 Alcoholic hepatitis without ascites: Secondary | ICD-10-CM | POA: Diagnosis present

## 2021-04-20 DIAGNOSIS — E870 Hyperosmolality and hypernatremia: Secondary | ICD-10-CM | POA: Diagnosis not present

## 2021-04-20 DIAGNOSIS — Z20822 Contact with and (suspected) exposure to covid-19: Secondary | ICD-10-CM | POA: Diagnosis present

## 2021-04-20 DIAGNOSIS — E871 Hypo-osmolality and hyponatremia: Principal | ICD-10-CM | POA: Diagnosis present

## 2021-04-20 DIAGNOSIS — Z79899 Other long term (current) drug therapy: Secondary | ICD-10-CM | POA: Diagnosis not present

## 2021-04-20 DIAGNOSIS — K7031 Alcoholic cirrhosis of liver with ascites: Secondary | ICD-10-CM | POA: Diagnosis present

## 2021-04-20 DIAGNOSIS — I82591 Chronic embolism and thrombosis of other specified deep vein of right lower extremity: Secondary | ICD-10-CM | POA: Diagnosis not present

## 2021-04-20 DIAGNOSIS — I82441 Acute embolism and thrombosis of right tibial vein: Secondary | ICD-10-CM | POA: Diagnosis present

## 2021-04-20 DIAGNOSIS — I82409 Acute embolism and thrombosis of unspecified deep veins of unspecified lower extremity: Secondary | ICD-10-CM | POA: Diagnosis present

## 2021-04-20 LAB — LIPASE, BLOOD: Lipase: 63 U/L — ABNORMAL HIGH (ref 11–51)

## 2021-04-20 LAB — CBC WITH DIFFERENTIAL/PLATELET
Abs Immature Granulocytes: 0.08 10*3/uL — ABNORMAL HIGH (ref 0.00–0.07)
Basophils Absolute: 0 10*3/uL (ref 0.0–0.1)
Basophils Relative: 1 %
Eosinophils Absolute: 0.1 10*3/uL (ref 0.0–0.5)
Eosinophils Relative: 1 %
HCT: 33.7 % — ABNORMAL LOW (ref 39.0–52.0)
Hemoglobin: 12 g/dL — ABNORMAL LOW (ref 13.0–17.0)
Immature Granulocytes: 1 %
Lymphocytes Relative: 22 %
Lymphs Abs: 1.4 10*3/uL (ref 0.7–4.0)
MCH: 33.3 pg (ref 26.0–34.0)
MCHC: 35.6 g/dL (ref 30.0–36.0)
MCV: 93.6 fL (ref 80.0–100.0)
Monocytes Absolute: 0.8 10*3/uL (ref 0.1–1.0)
Monocytes Relative: 13 %
Neutro Abs: 3.9 10*3/uL (ref 1.7–7.7)
Neutrophils Relative %: 62 %
Platelets: 62 10*3/uL — ABNORMAL LOW (ref 150–400)
RBC: 3.6 MIL/uL — ABNORMAL LOW (ref 4.22–5.81)
RDW: 14.8 % (ref 11.5–15.5)
WBC: 6.4 10*3/uL (ref 4.0–10.5)
nRBC: 0 % (ref 0.0–0.2)

## 2021-04-20 LAB — CBC
HCT: 32.8 % — ABNORMAL LOW (ref 39.0–52.0)
Hemoglobin: 11.6 g/dL — ABNORMAL LOW (ref 13.0–17.0)
MCH: 33.2 pg (ref 26.0–34.0)
MCHC: 35.4 g/dL (ref 30.0–36.0)
MCV: 94 fL (ref 80.0–100.0)
Platelets: 56 10*3/uL — ABNORMAL LOW (ref 150–400)
RBC: 3.49 MIL/uL — ABNORMAL LOW (ref 4.22–5.81)
RDW: 14.7 % (ref 11.5–15.5)
WBC: 5.8 10*3/uL (ref 4.0–10.5)
nRBC: 0 % (ref 0.0–0.2)

## 2021-04-20 LAB — COMPREHENSIVE METABOLIC PANEL
ALT: 44 U/L (ref 0–44)
ALT: 39 U/L (ref 0–44)
AST: 148 U/L — ABNORMAL HIGH (ref 15–41)
AST: 142 U/L — ABNORMAL HIGH (ref 15–41)
Albumin: 2.7 g/dL — ABNORMAL LOW (ref 3.5–5.0)
Albumin: 2.5 g/dL — ABNORMAL LOW (ref 3.5–5.0)
Alkaline Phosphatase: 327 U/L — ABNORMAL HIGH (ref 38–126)
Alkaline Phosphatase: 320 U/L — ABNORMAL HIGH (ref 38–126)
Anion gap: 6 (ref 5–15)
Anion gap: 5 (ref 5–15)
BUN: 16 mg/dL (ref 6–20)
BUN: 17 mg/dL (ref 6–20)
CO2: 21 mmol/L — ABNORMAL LOW (ref 22–32)
CO2: 22 mmol/L (ref 22–32)
Calcium: 8.4 mg/dL — ABNORMAL LOW (ref 8.9–10.3)
Calcium: 8.2 mg/dL — ABNORMAL LOW (ref 8.9–10.3)
Chloride: 92 mmol/L — ABNORMAL LOW (ref 98–111)
Chloride: 94 mmol/L — ABNORMAL LOW (ref 98–111)
Creatinine, Ser: 0.98 mg/dL (ref 0.61–1.24)
Creatinine, Ser: 0.86 mg/dL (ref 0.61–1.24)
GFR, Estimated: >60 mL/min (ref >60)
GFR, Estimated: >60 mL/min (ref >60)
Glucose, Bld: 108 mg/dL — ABNORMAL HIGH (ref 70–99)
Glucose, Bld: 108 mg/dL — ABNORMAL HIGH (ref 70–99)
Potassium: 5.1 mmol/L (ref 3.5–5.1)
Potassium: 4.7 mmol/L (ref 3.5–5.1)
Sodium: 119 mmol/L — CL (ref 135–145)
Sodium: 121 mmol/L — ABNORMAL LOW (ref 135–145)
Total Bilirubin: 7.8 mg/dL — ABNORMAL HIGH (ref 0.3–1.2)
Total Bilirubin: 7.4 mg/dL — ABNORMAL HIGH (ref 0.3–1.2)
Total Protein: 6.2 g/dL — ABNORMAL LOW (ref 6.5–8.1)
Total Protein: 5.9 g/dL — ABNORMAL LOW (ref 6.5–8.1)

## 2021-04-20 LAB — MAGNESIUM: Magnesium: 1.6 mg/dL — ABNORMAL LOW (ref 1.7–2.4)

## 2021-04-20 LAB — BASIC METABOLIC PANEL
BUN: 15 mg/dL (ref 6–23)
CO2: 19 mEq/L (ref 19–32)
Calcium: 8.2 mg/dL — ABNORMAL LOW (ref 8.4–10.5)
Chloride: 91 mEq/L — ABNORMAL LOW (ref 96–112)
Creatinine, Ser: 1.04 mg/dL (ref 0.40–1.50)
GFR: 81.38 mL/min (ref >60.00)
Glucose, Bld: 145 mg/dL — ABNORMAL HIGH (ref 70–99)
Potassium: 4.8 mEq/L (ref 3.5–5.1)
Sodium: 118 mEq/L — CL (ref 135–145)

## 2021-04-20 LAB — PHOSPHORUS: Phosphorus: 3.2 mg/dL (ref 2.5–4.6)

## 2021-04-20 MED ORDER — THIAMINE HCL 100 MG/ML IJ SOLN: 100.0000 mg | Freq: Every day | INTRAMUSCULAR | Status: DC

## 2021-04-20 MED ORDER — LORAZEPAM 2 MG/ML IJ SOLN: 1.0000 mg | INTRAMUSCULAR | Status: DC | PRN

## 2021-04-20 MED ORDER — ONDANSETRON HCL 4 MG/2ML IJ SOLN: 4.0000 mg | Freq: Four times a day (QID) | INTRAMUSCULAR | Status: DC | PRN

## 2021-04-20 MED ORDER — AZELASTINE HCL 0.1 % NA SOLN
1.0000 | Freq: Every day | NASAL | Status: DC | PRN
  Filled 2021-04-20: qty 30

## 2021-04-20 MED ORDER — RIFAXIMIN 550 MG PO TABS
550.0000 mg | ORAL_TABLET | Freq: Two times a day (BID) | ORAL | Status: DC
  Administered 2021-04-21 – 2021-04-23 (×6): 550 mg via ORAL
  Filled 2021-04-20 (×6): qty 1

## 2021-04-20 MED ORDER — FUROSEMIDE 40 MG PO TABS: 40.0000 mg | ORAL_TABLET | Freq: Every day | ORAL | Status: DC

## 2021-04-20 MED ORDER — THIAMINE HCL 100 MG PO TABS
100.0000 mg | ORAL_TABLET | Freq: Every day | ORAL | Status: DC
  Administered 2021-04-21 – 2021-04-23 (×3): 100 mg via ORAL
  Filled 2021-04-20 (×3): qty 1

## 2021-04-20 MED ORDER — LACTULOSE 10 GM/15ML PO SOLN
30.0000 g | Freq: Three times a day (TID) | ORAL | Status: DC
  Administered 2021-04-21 – 2021-04-22 (×6): 30 g via ORAL
  Filled 2021-04-20 (×6): qty 45

## 2021-04-20 MED ORDER — FOLIC ACID 1 MG PO TABS: 1.0000 mg | ORAL_TABLET | Freq: Every day | ORAL | Status: DC

## 2021-04-20 MED ORDER — SODIUM CHLORIDE 0.9 % IV SOLN: INTRAVENOUS | Status: DC

## 2021-04-20 MED ORDER — ONDANSETRON HCL 4 MG PO TABS: 4.0000 mg | ORAL_TABLET | Freq: Four times a day (QID) | ORAL | Status: DC | PRN

## 2021-04-20 MED ORDER — FOLIC ACID 1 MG PO TABS
1.0000 mg | ORAL_TABLET | Freq: Every day | ORAL | Status: DC
  Administered 2021-04-21 – 2021-04-23 (×3): 1 mg via ORAL
  Filled 2021-04-20 (×3): qty 1

## 2021-04-20 MED ORDER — LORAZEPAM 1 MG PO TABS: 1.0000 mg | ORAL_TABLET | ORAL | Status: DC | PRN

## 2021-04-20 MED ORDER — ADULT MULTIVITAMIN W/MINERALS CH
1.0000 | ORAL_TABLET | Freq: Every day | ORAL | Status: DC
  Administered 2021-04-21 – 2021-04-23 (×3): 1 via ORAL
  Filled 2021-04-20 (×3): qty 1

## 2021-04-20 MED ORDER — MECLIZINE HCL 25 MG PO TABS: 12.5000 mg | ORAL_TABLET | Freq: Every day | ORAL | Status: DC | PRN

## 2021-04-20 MED ORDER — SPIRONOLACTONE 100 MG PO TABS
100.0000 mg | ORAL_TABLET | Freq: Every day | ORAL | Status: DC
  Administered 2021-04-21 – 2021-04-23 (×3): 100 mg via ORAL
  Filled 2021-04-20 (×3): qty 1

## 2021-04-20 MED ORDER — ALBUTEROL SULFATE HFA 108 (90 BASE) MCG/ACT IN AERS
2.0000 | INHALATION_SPRAY | Freq: Four times a day (QID) | RESPIRATORY_TRACT | Status: DC | PRN
  Filled 2021-04-20: qty 6.7

## 2021-04-20 MED ORDER — DICYCLOMINE HCL 10 MG PO CAPS
10.0000 mg | ORAL_CAPSULE | Freq: Three times a day (TID) | ORAL | Status: DC
  Administered 2021-04-21 – 2021-04-23 (×10): 10 mg via ORAL
  Filled 2021-04-20 (×12): qty 1

## 2021-04-20 NOTE — ED Provider Notes (Signed)
[facility] DEPT Provider Note   CSN: [id number] Arrival date & time: 04/20/21  1519     History Chief Complaint  Patient presents with  . Abnormal Lab    Ian Hall is a 55 y.o. male.  HPI     55yo male with history of DVT, alcoholic cirrhosis, who presents with concern for hyponatremia.   Had been seen on May 20 in the emergency department for hyponatremia, however on recheck of labs in the ED, his sodium level had improved to 123 and plan was to discontinue medications and follow-up as an outpatient.  Today, they obtained his lab work and found his sodium to be 119 and again and he was sent to the emergency department.  He denies any significant symptoms, including no confusion, headache, fever, nausea or vomiting.  Denies diarrhea, black or bloody stools.  Reports he is almost out of his diuretic.  Is not clear if he has been holding the dose for the last few days.  He reports has not missed any medication.  Reports he has been eating and drinking normally.  He has been drinking 2 bottles of water a day instead of what he had been doing before because of concern for low sodium levels.   Past Medical History:  Diagnosis Date  . Anxiety   . Cirrhosis ([place])   . COVID-19     Patient Active Problem List   Diagnosis Date Noted  . Hypomagnesemia 04/20/2021  . DVT (deep venous thrombosis) ([place]) 04/17/2021  . Alcoholic cirrhosis of liver without ascites ([place])   . Acute deep vein thrombosis (DVT) of right tibial vein ([place]) 02/12/2021  . Hyponatremia 02/06/2021  . Dyspnea on exertion   . Abdominal swelling, generalized   . Ascites due to alcoholic cirrhosis ([place])   . Jaundice   . 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 Thrombocytopenia ([place]) 01/07/2021  . Alcohol abuse, episodic 01/07/2021  . Alcoholic hepatitis 65/46/5035  . Cirrhosis of liver with ascites ([place])   . Hyperbilirubinemia   . Alcoholic hepatitis with ascites 12/26/2020  .  Hepatitis, alcoholic, acute 46/56/8127  . 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 ANKLE SURGERY Right   . IR PARACENTESIS  01/07/2021  . IR PARACENTESIS  01/30/2021  . IR TRANSCATHETER BX  01/09/2021  . IR US GUIDE VASC ACCESS RIGHT  01/09/2021  . IR VENOGRAM HEPATIC W HEMODYNAMIC EVALUATION  01/09/2021       Family History  Problem Relation Age of Onset  . Diabetes Mother   . Memory loss Mother   . Diabetes Father   . Diabetes Cousin   . Colon cancer Neg Hx     Social History   Tobacco Use  . Smoking status: Former Smoker    Types: Cigarettes    Quit date: 12/16/2002    Years since quitting: 18.3  . Smokeless tobacco: Never Used  Vaping Use  . Vaping Use: Never used  Substance Use Topics  . Alcohol use: Not Currently    Comment: none for 3-4 years (stated 12/16/2020)  . Drug use: No    Home Medications Prior to Admission medications   Medication Sig Start Date End Date Taking? Authorizing Provider  albuterol (VENTOLIN HFA) 108 (90 Base) MCG/ACT inhaler Inhale 2 puffs into the lungs every 6 (six) hours as needed for wheezing or shortness of breath. 04/03/21  Yes Maximiano Coss, NP  azelastine (ASTELIN) 0.1 % nasal spray Place 1  spray into both nostrils daily. Use in each nostril as directed Patient taking differently: Place 1 spray into both nostrils daily as needed for allergies. Use in each nostril as directed 03/19/21  Yes Maximiano Coss, NP  dicyclomine (BENTYL) 10 MG capsule Take 1 capsule (10 mg total) by mouth 4 (four) times daily -  before meals and at bedtime. 12/16/20  Yes Thornton [place], MD  folic acid (FOLVITE) 1 MG tablet Take 1 tablet (1 mg total) by mouth daily. 03/26/21  Yes Maximiano Coss, NP  furosemide (LASIX) 40 MG tablet Take 1 tablet (40 mg total) by mouth daily. 03/01/21 05/30/21 Yes [place] Eric J, DO  lactulose (CHRONULAC) 10 GM/15ML solution Take 45 mLs (30 g total) by mouth 3 (three) times daily. 03/01/21  05/30/21 Yes [place] Eric J, DO  meclizine (ANTIVERT) 12.5 MG tablet Take 1 tablet (12.5 mg total) by mouth daily as needed for dizziness. 03/19/21  Yes Maximiano Coss, NP  rifaximin (XIFAXAN) 550 MG TABS tablet Take 1 tablet (550 mg total) by mouth 2 (two) times daily. 03/01/21 05/30/21 Yes [place] Donnamarie Poag, DO  spironolactone (ALDACTONE) 100 MG tablet Take 1 tablet (100 mg total) by mouth daily. 03/01/21 05/30/21 Yes [place] Eric J, DO  nadolol (CORGARD) 20 MG tablet Take 1 tablet (20 mg total) by mouth daily. Patient not taking: Reported on 04/20/2021 03/10/21   Maximiano Coss, NP  pantoprazole (PROTONIX) 40 MG tablet Take 1 tablet (40 mg total) by mouth 2 (two) times daily. Patient not taking: Reported on 04/20/2021 04/16/21 05/16/21  Maximiano Coss, NP    Allergies    Hydrocodone  Review of Systems   Review of Systems  Constitutional: Negative for fever.  HENT: Negative for sore throat.   Eyes: Negative for visual disturbance.  Respiratory: Negative for shortness of breath.   Cardiovascular: Negative for chest pain.  Gastrointestinal: Negative for abdominal pain.  Genitourinary: Negative for difficulty urinating.  Musculoskeletal: Negative for back pain and neck stiffness.  Skin: Negative for rash.  Neurological: Negative for syncope and headaches.    Physical Exam Updated Vital Signs BP 106/65 (BP Location: Right Arm)   Pulse 71   Temp 97.6 F (36.4 C) (Oral)   Resp 16   SpO2 100%   Physical Exam Vitals and nursing note reviewed.  Constitutional:      General: He is not in acute distress.    Appearance: He is well-developed. He is not diaphoretic.  HENT:     Head: Normocephalic and atraumatic.  Eyes:     Conjunctiva/sclera: Conjunctivae normal.  Cardiovascular:     Rate and Rhythm: Normal rate and regular rhythm.     Heart sounds: Normal heart sounds. No murmur heard. No friction rub. No gallop.   Pulmonary:     Effort: Pulmonary effort is normal. No respiratory distress.      Breath sounds: Normal breath sounds. No wheezing or rales.  Abdominal:     General: There is no distension.     Palpations: Abdomen is soft.     Tenderness: There is no abdominal tenderness. There is no guarding.  Musculoskeletal:     Cervical back: Normal range of motion.     Right lower leg: Edema present.     Left lower leg: Edema present.  Skin:    General: Skin is warm and dry.  Neurological:     Mental Status: He is alert and oriented to person, place, and time.     ED Results / Procedures / Treatments  Labs (all labs ordered are listed, but only abnormal results are displayed) Labs Reviewed  COMPREHENSIVE METABOLIC PANEL - Abnormal; Notable for the following components:      Result Value   Sodium 119 (*)    Chloride 92 (*)    CO2 21 (*)    Glucose, Bld 108 (*)    Calcium 8.4 (*)    Total Protein 6.2 (*)    Albumin 2.7 (*)    AST 148 (*)    Alkaline Phosphatase 327 (*)    Total Bilirubin 7.8 (*)    All other components within normal limits  CBC WITH DIFFERENTIAL/PLATELET - Abnormal; Notable for the following components:   RBC 3.60 (*)    Hemoglobin 12.0 (*)    HCT 33.7 (*)    Platelets 62 (*)    Abs Immature Granulocytes 0.08 (*)    All other components within normal limits  URINALYSIS, ROUTINE W REFLEX MICROSCOPIC - Abnormal; Notable for the following components:   Hgb urine dipstick SMALL (*)    All other components within normal limits  OSMOLALITY - Abnormal; Notable for the following components:   Osmolality 259 (*)    All other components within normal limits  OSMOLALITY, URINE - Abnormal; Notable for the following components:   Osmolality, Ur 215 (*)    All other components within normal limits  LIPASE, BLOOD - Abnormal; Notable for the following components:   Lipase 63 (*)    All other components within normal limits  CBC - Abnormal; Notable for the following components:   RBC 3.46 (*)    Hemoglobin 11.6 (*)    HCT 32.8 (*)    Platelets 55  (*)    All other components within normal limits  COMPREHENSIVE METABOLIC PANEL - Abnormal; Notable for the following components:   Sodium 122 (*)    Chloride 95 (*)    Calcium 8.3 (*)    Total Protein 5.7 (*)    Albumin 2.5 (*)    AST 149 (*)    Alkaline Phosphatase 331 (*)    Total Bilirubin 7.0 (*)    All other components within normal limits  COMPREHENSIVE METABOLIC PANEL - Abnormal; Notable for the following components:   Sodium 121 (*)    Chloride 94 (*)    Glucose, Bld 108 (*)    Calcium 8.2 (*)    Total Protein 5.9 (*)    Albumin 2.5 (*)    AST 142 (*)    Alkaline Phosphatase 320 (*)    Total Bilirubin 7.4 (*)    All other components within normal limits  MAGNESIUM - Abnormal; Notable for the following components:   Magnesium 1.6 (*)    All other components within normal limits  CBC - Abnormal; Notable for the following components:   RBC 3.49 (*)    Hemoglobin 11.6 (*)    HCT 32.8 (*)    Platelets 56 (*)    All other components within normal limits  SARS CORONAVIRUS 2 (TAT 6-24 HRS)  SODIUM, URINE, RANDOM  PHOSPHORUS  PROTIME-INR    EKG None  Radiology No results found.  Procedures Procedures   Medications Ordered in ED Medications  ondansetron (ZOFRAN) tablet 4 mg (has no administration in time range)    Or  ondansetron (ZOFRAN) injection 4 mg (has no administration in time range)  thiamine tablet 100 mg (100 mg Oral Given 04/21/21 0959)    Or  thiamine (B-1) injection 100 mg ( Intravenous See Alternative 04/21/21 0959)  folic acid (FOLVITE) tablet 1 mg (1 mg Oral Given 04/21/21 0958)  multivitamin with minerals tablet 1 tablet (1 tablet Oral Given 04/21/21 0958)  dicyclomine (BENTYL) capsule 10 mg (10 mg Oral Given 04/21/21 0747)  rifaximin (XIFAXAN) tablet 550 mg (550 mg Oral Given 04/21/21 1005)  spironolactone (ALDACTONE) tablet 100 mg (100 mg Oral Given 04/21/21 0958)  lactulose (CHRONULAC) 10 GM/15ML solution 30 g (30 g Oral Given 04/21/21 0959)   meclizine (ANTIVERT) tablet 12.5 mg (has no administration in time range)  azelastine (ASTELIN) 0.1 % nasal spray 1 spray (has no administration in time range)  albuterol (VENTOLIN HFA) 108 (90 Base) MCG/ACT inhaler 2 puff (has no administration in time range)    ED Course  I have reviewed the triage vital signs and the nursing notes.  Pertinent labs & imaging results that were available during my care of the patient were reviewed by me and considered in my medical decision making (see chart for details).    MDM Rules/Calculators/A&P                          55yo male with history of DVT, alcoholic cirrhosis, who presents with concern for hyponatremia.  Appears euvolemic on exam, denies any acute symptoms.  Given severity of the hyponatremia, however, feel he requires admission to the hospital for further management.  Admitted in stable condition.   Final Clinical Impression(s) / ED Diagnoses Final diagnoses:  Hyponatremia    Rx / DC Orders ED Discharge Orders    None       Gareth Morgan, MD 04/21/21 1226

## 2021-04-20 NOTE — Telephone Encounter (Signed)
Called pt to remind to proceed to lab for completion of labs either today or tomorrow. Verbalized acceptance and understanding. Also scheduled for follow up in 2 weeks at Dr. Tarri Glenn request. Pt has been schedule as follows:  Next Appt With Gastroenterology (Amy Trellis Paganini, PA-C) 05/11/2021 at 9:00 AM  Will plan to keep additional follow up with Dr. Tarri Glenn on 6/23 @ 8:50am, and will cancel if needed pending Amy Esterwood's assessment/plan. Pt agrees with current plan.

## 2021-04-20 NOTE — Telephone Encounter (Signed)
Noted, appreciate the update

## 2021-04-20 NOTE — ED Notes (Signed)
Provider at the bedside to evaluate.

## 2021-04-20 NOTE — ED Triage Notes (Signed)
Per patient, states he had labs done this am and was told to come to ED due to sodium being low-states it is due to his "liver"

## 2021-04-20 NOTE — Telephone Encounter (Signed)
Received incoming call from Lab indicating critical Na+ level of 118. Routing this message to DOD for further advice. Will await response.

## 2021-04-20 NOTE — H&P (Signed)
History and Physical   DONOVYN GUIDICE HRC:[id number] DOB: [birth year] DOA: 04/20/2021  Referring MD/NP/PA: Dr. Billy Fischer  PCP: Maximiano Coss, NP   Outpatient Specialists: Dr. Quay Burow, cardiology and Dr. Berniece Pap gastroenterology  Patient coming from: Home  Chief Complaint: Weakness  HPI: Ian Hall is a 55 y.o. male with medical history significant of alcoholic cirrhosis, recent COVID-19 infection, anxiety disorder, history of DVT, chronic hyponatremia who presents to the ER with concern for low sodium.  Patient felt a little dizzy and weak today.  He also has complaint of abnormal gait.  He is a chronic alcoholic who initially quit 10 years ago.  Patient was diagnosed with cirrhosis and has been following with gastroenterology.  Plan is to set him up with liver transplant team.  He went back to drinking briefly last December and ended up being admitted with decompensated cirrhosis.  Since then patient has had multiple visits to the ER including admission.  He was seen by gastroenterology and sodium is now down to 118.  It was previously in the 120s.  There is concerned that patient is becoming symptomatic so sent to the ER where he is seen and evaluated today.  His sodium was noted to be 118 so he is being admitted to the hospital for evaluation and treatment. .  ED Course: Temperature 98.1 blood pressure 130/84, pulse 85 respirate of 24 oxygen sat 98% on room air.  Sodium is 118 potassium 4.8 chloride 91 CO2 19 glucose is 145.  His creatinine 1.04.  Alkaline phosphatase is 327 albumin 2.7 lipase 63 AST 148 ALT 44 and total protein 6.2.  Total bilirubin is 7.8.  CBC largely within normal.  Patient is being admitted with hyponatremia.  Review of Systems: As per HPI otherwise 10 point review of systems negative.    Past Medical History:  Diagnosis Date  . Anxiety   . Cirrhosis ([place])   . COVID-19     Past Surgical History:  Procedure Laterality Date  . ANKLE SURGERY Right    . IR PARACENTESIS  01/07/2021  . IR PARACENTESIS  01/30/2021  . IR TRANSCATHETER BX  01/09/2021  . IR US GUIDE VASC ACCESS RIGHT  01/09/2021  . IR VENOGRAM HEPATIC W HEMODYNAMIC EVALUATION  01/09/2021     reports that he quit smoking about 18 years ago. His smoking use included cigarettes. He has never used smokeless tobacco. He reports previous alcohol use. He reports that he does not use drugs.  Allergies  Allergen Reactions  . Hydrocodone Itching and Other (See Comments)    Noted with hydrocodone cough syrup. Itching only, no hives.  02/03/16: denied itching with recent hydrocodone cough syrup. Only one episode of itching previously.     Family History  Problem Relation Age of Onset  . Diabetes Mother   . Memory loss Mother   . Diabetes Father   . Diabetes Cousin   . Colon cancer Neg Hx      Prior to Admission medications   Medication Sig Start Date End Date Taking? Authorizing Provider  albuterol (VENTOLIN HFA) 108 (90 Base) MCG/ACT inhaler Inhale 2 puffs into the lungs every 6 (six) hours as needed for wheezing or shortness of breath. 04/03/21   Maximiano Coss, NP  azelastine (ASTELIN) 0.1 % nasal spray Place 1 spray into both nostrils daily. Use in each nostril as directed 03/19/21   Maximiano Coss, NP  dicyclomine (BENTYL) 10 MG capsule Take 1 capsule (10 mg total) by mouth 4 (four) times daily -  before meals and at bedtime. Patient not taking: No sig reported 12/16/20   Thornton Park, MD  folic acid (FOLVITE) 1 MG tablet Take 1 tablet (1 mg total) by mouth daily. 03/26/21   Maximiano Coss, NP  furosemide (LASIX) 40 MG tablet Take 1 tablet (40 mg total) by mouth daily. 03/01/21 05/30/21  [place] Donnamarie Poag, DO  lactulose (CHRONULAC) 10 GM/15ML solution Take 45 mLs (30 g total) by mouth 3 (three) times daily. 03/01/21 05/30/21  [place] Donnamarie Poag, DO  meclizine (ANTIVERT) 12.5 MG tablet Take 1 tablet (12.5 mg total) by mouth daily as needed for dizziness. Patient not taking: No sig reported  03/19/21   Maximiano Coss, NP  nadolol (CORGARD) 20 MG tablet Take 1 tablet (20 mg total) by mouth daily. 03/10/21   Maximiano Coss, NP  pantoprazole (PROTONIX) 40 MG tablet Take 1 tablet (40 mg total) by mouth 2 (two) times daily. 04/16/21 05/16/21  Maximiano Coss, NP  rifaximin (XIFAXAN) 550 MG TABS tablet Take 1 tablet (550 mg total) by mouth 2 (two) times daily. 03/01/21 05/30/21  [place] Eric J, DO  spironolactone (ALDACTONE) 100 MG tablet Take 1 tablet (100 mg total) by mouth daily. 03/01/21 05/30/21  [place] Eric J, DO    Physical Exam: Vitals:   04/20/21 1845 04/20/21 1915 04/20/21 1930 04/20/21 2023  BP: 129/79 125/79 125/78 123/66  Pulse: 74 76 74 74  Resp: 20 (!) 23 (!) 25 (!) 21  Temp:    98.1 F (36.7 C)  TempSrc:    Oral  SpO2: 100% 100% 100% 100%      Constitutional: Chronically ill looking, no distress Vitals:   04/20/21 1845 04/20/21 1915 04/20/21 1930 04/20/21 2023  BP: 129/79 125/79 125/78 123/66  Pulse: 74 76 74 74  Resp: 20 (!) 23 (!) 25 (!) 21  Temp:    98.1 F (36.7 C)  TempSrc:    Oral  SpO2: 100% 100% 100% 100%   Eyes: PERRL, lids and conjunctivae normal ENMT: Mucous membranes are dry. Posterior pharynx clear of any exudate or lesions.Normal dentition.  Neck: normal, supple, no masses, no thyromegaly Respiratory: clear to auscultation bilaterally, no wheezing, no crackles. Normal respiratory effort. No accessory muscle use.  Cardiovascular: Regular rate and rhythm, no murmurs / rubs / gallops. No extremity edema. 2+ pedal pulses. No carotid bruits.  Abdomen: Distended, mild ascites, no tenderness, no masses palpated. No hepatosplenomegaly. Bowel sounds positive.  Musculoskeletal: no clubbing / cyanosis. No joint deformity upper and lower extremities. Good ROM, no contractures. Normal muscle tone.  Skin: no rashes, lesions, ulcers. No induration Neurologic: CN 2-12 grossly intact. Sensation intact, DTR normal. Strength 5/5 in all 4.  Psychiatric: Normal  judgment and insight. Alert and oriented x 3. Normal mood.     Labs on Admission: I have personally reviewed following labs and imaging studies  CBC: Recent Labs  Lab 04/17/21 1336 04/17/21 1453 04/20/21 1610 04/20/21 2215  WBC 6.2  --  6.4 5.8  NEUTROABS 4.1  --  3.9  --   HGB 12.4* 11.9* 12.0* 11.6*  HCT 34.9* 35.0* 33.7* 32.8*  MCV 93.8  --  93.6 94.0  PLT 61*  --  62* 56*   Basic Metabolic Panel: Recent Labs  Lab 04/17/21 0938 04/17/21 1336 04/17/21 1453 04/20/21 1000 04/20/21 1610 04/20/21 2215  NA 119* 123* 124* 118* 119* 121*  K 4.6 4.3 4.8 4.8 5.1 4.7  CL 91* 96* 93* 91* 92* 94*  CO2 19 19*  --  19  21* 22  GLUCOSE 104* 102* 122* 145* 108* 108*  BUN 16 20 22* 15 16 17   CREATININE 1.04 1.08 1.20 1.04 0.98 0.86  CALCIUM 8.7 8.5*  --  8.2* 8.4* 8.2*  MG  --   --   --   --   --  1.6*  PHOS  --   --   --   --   --  3.2   GFR: Estimated Creatinine Clearance: 106.5 mL/min (by C-G formula based on SCr of 0.86 mg/dL). Liver Function Tests: Recent Labs  Lab 04/17/21 1336 04/20/21 1610 04/20/21 2215  AST 152* 148* 142*  ALT 48* 44 39  ALKPHOS 347* 327* 320*  BILITOT 8.1* 7.8* 7.4*  PROT 6.1* 6.2* 5.9*  ALBUMIN 2.7* 2.7* 2.5*   Recent Labs  Lab 04/20/21 1610  LIPASE 63*   No results for input(s): AMMONIA in the last 168 hours. Coagulation Profile: No results for input(s): INR, PROTIME in the last 168 hours. Cardiac Enzymes: No results for input(s): CKTOTAL, CKMB, CKMBINDEX, TROPONINI in the last 168 hours. BNP (last 3 results) No results for input(s): PROBNP in the last 8760 hours. HbA1C: No results for input(s): HGBA1C in the last 72 hours. CBG: No results for input(s): GLUCAP in the last 168 hours. Lipid Profile: No results for input(s): CHOL, HDL, LDLCALC, TRIG, CHOLHDL, LDLDIRECT in the last 72 hours. Thyroid Function Tests: No results for input(s): TSH, T4TOTAL, FREET4, T3FREE, THYROIDAB in the last 72 hours. Anemia Panel: No results for  input(s): VITAMINB12, FOLATE, FERRITIN, TIBC, IRON, RETICCTPCT in the last 72 hours. Urine analysis:    Component Value Date/Time   COLORURINE AMBER (A) 04/17/2021 1336   APPEARANCEUR CLEAR 04/17/2021 1336   APPEARANCEUR Cloudy (A) 01/14/2021 1630   LABSPEC 1.014 04/17/2021 1336   PHURINE 5.0 04/17/2021 1336   GLUCOSEU NEGATIVE 04/17/2021 1336   GLUCOSEU NEGATIVE 03/10/2021 1238   HGBUR SMALL (A) 04/17/2021 1336   BILIRUBINUR NEGATIVE 04/17/2021 1336   BILIRUBINUR Negative 01/14/2021 [place] 04/17/2021 1336   PROTEINUR NEGATIVE 04/17/2021 1336   UROBILINOGEN 0.2 03/10/2021 1238   NITRITE NEGATIVE 04/17/2021 1336   [place] 04/17/2021 1336   Sepsis Labs: @LABRCNTIP (procalcitonin:4,lacticidven:4) )No results found for this or any previous visit (from the past 240 hour(s)).   Radiological Exams on Admission: No results found.    Assessment/Plan Principal Problem:   Hyponatremia Active Problems:   Hepatitis, alcoholic, acute   Alcohol abuse, episodic   Hyperbilirubinemia   Ascites due to alcoholic cirrhosis (HCC)   Acute deep vein thrombosis (DVT) of right tibial vein (HCC)   Alcoholic cirrhosis of liver without ascites (HCC)   DVT (deep venous thrombosis) (HCC)   Hypomagnesemia     #1 hyponatremia: Most likely due to decompensated cirrhosis.  Patient will be admitted.  Spot urine sodium and serum osmolality will be checked.  Chloride appears to be low as well.  We will have gentle saline as patient does not appear to have significant volume overload.  Monitor sodium level.  His baseline is probably in the 120s.  Hold patient's furosemide  #2 hypomagnesemia: We will replete magnesium.  Current level is 1.6.  #3 alcoholic cirrhosis: It has remained chronically decompensated.  LFTs noted.  Significant hyperbilirubinemia.  Continue to monitor.  Patient is on lactulose, Aldactone and Xifaxan.  We will continue  #4 history of DVT: Not on any  treatment at the moment.  Continue to monitor  #5  History of alcohol abuse: Patient reported not  drinking since January.  We will empirically order CIWA protocol if needed.   DVT prophylaxis: SCD Code Status: Full code Family Communication: No family at bedside Disposition Plan: Home Consults called: None Admission status: Inpatient for now  Severity of Illness: The appropriate patient status for this patient is INPATIENT. Inpatient status is judged to be reasonable and necessary in order to provide the required intensity of service to ensure the patient's safety. The patient's presenting symptoms, physical exam findings, and initial radiographic and laboratory data in the context of their chronic comorbidities is felt to place them at high risk for further clinical deterioration. Furthermore, it is not anticipated that the patient will be medically stable for discharge from the hospital within 2 midnights of admission. The following factors support the patient status of inpatient.   " The patient's presenting symptoms include low sodium and dizziness. " The worrisome physical exam findings include mild abdominal distention. " The initial radiographic and laboratory data are worrisome because of sodium 118. " The chronic co-morbidities include alcoholic cirrhosis.   * I certify that at the point of admission it is my clinical judgment that the patient will require inpatient hospital care spanning beyond 2 midnights from the point of admission due to high intensity of service, high risk for further deterioration and high frequency of surveillance required.Barbette Merino MD [facility] Pager [phone]  If 7PM-7AM, please contact night-coverage [email or web address] Password [facility]  04/20/2021, 11:47 PM

## 2021-04-20 NOTE — ED Provider Notes (Addendum)
Emergency Medicine Provider Triage Evaluation Note  Ian Hall 55 y.o. male was evaluated in triage.  Pt complains of abnormal lab.  He was called by his PCP today and told that his sodium was low to go to the emergency department.  He states he has had hyponatremia before.  He denies any new medications.  No nausea/vomiting.  Occasionally, he will have some abdominal pain.  No chest pain, difficulty breathing.  He states he always has some scleral icterus.   Review of Systems  Positive: Abnormal lab, abdominal pain Negative: Nausea/vomiting, chest pain, difficulty breathing.  Physical Exam  BP 134/82   Pulse 70   Temp 98.2 F (36.8 C) (Oral)   Resp 18   Ht 5' 4"  (1.626 m)   Wt 65.8 kg   SpO2 100%   BMI 24.89 kg/m  Gen:   Awake, no distress   HEENT:  Atraumatic.  Scleral icterus noted. Resp:  Normal effort  Cardiac:  Normal rate  Abd:   Nondistended, nontender  MSK:   Moves extremities without difficulty  Neuro:  Speech clear   Other:      Medical Decision Making  Medically screening exam initiated at 3:57 PM  Appropriate orders placed.  LARSON LIMONES was informed that the remainder of the evaluation will be completed by another provider, this initial triage assessment does not replace that evaluation. They are counseled that they will need to remain in the ED until the completion of their workup, including full H&P and results of any tests.  Risks of leaving the emergency department prior to completion of treatment were discussed. Patient was advised to inform ED staff if they are leaving before their treatment is complete. The patient acknowledged these risks and time was allowed for questions.     The patient appears stable so that the remainder of the MSE may be completed by another provider.  5:18 PM: I was notifed that patient's sodium was 119. Advised nursing staff (Charge RN, [place]) that patient would need to be evaluated in the main ED.   Clinical Impression   Abnormal lab   Portions of this note were generated with Dragon dictation software. Dictation errors may occur despite best attempts at proofreading.      Volanda Napoleon, PA-C 04/20/21 1558    Lennice Sites, DO 04/20/21 1628    Volanda Napoleon, PA-C 04/20/21 1719    Lennice Sites, DO 04/21/21 [phone]

## 2021-04-20 NOTE — Telephone Encounter (Signed)
Called pt and informed him to proceed to ED for further evaluation. States he is going to proceed [facility] now. Routing to DOD and hosp providers to make them aware. Also routing to Nicoletta Ba, [place] and Dr. Tarri Glenn for continuity of care purposes.

## 2021-04-20 NOTE — Telephone Encounter (Signed)
Na=118 requires ED evaluation and possible admission for further mgmt.

## 2021-04-21 ENCOUNTER — Ambulatory Visit: Payer: No Typology Code available for payment source | Admitting: Registered Nurse

## 2021-04-21 ENCOUNTER — Telehealth: Payer: Self-pay | Admitting: Registered Nurse

## 2021-04-21 ENCOUNTER — Telehealth: Payer: Self-pay | Admitting: Nurse Practitioner

## 2021-04-21 DIAGNOSIS — K7030 Alcoholic cirrhosis of liver without ascites: Secondary | ICD-10-CM

## 2021-04-21 DIAGNOSIS — I82591 Chronic embolism and thrombosis of other specified deep vein of right lower extremity: Secondary | ICD-10-CM

## 2021-04-21 DIAGNOSIS — D696 Thrombocytopenia, unspecified: Secondary | ICD-10-CM

## 2021-04-21 LAB — URINALYSIS, ROUTINE W REFLEX MICROSCOPIC
Bacteria, UA: NONE SEEN
Bilirubin Urine: NEGATIVE
Glucose, UA: NEGATIVE mg/dL
Ketones, ur: NEGATIVE mg/dL
Leukocytes,Ua: NEGATIVE
Nitrite: NEGATIVE
Protein, ur: NEGATIVE mg/dL
Specific Gravity, Urine: 1.005 (ref 1.005–1.030)
pH: 7 (ref 5.0–8.0)

## 2021-04-21 LAB — COMPREHENSIVE METABOLIC PANEL
ALT: 42 U/L (ref 0–44)
AST: 149 U/L — ABNORMAL HIGH (ref 15–41)
Albumin: 2.5 g/dL — ABNORMAL LOW (ref 3.5–5.0)
Alkaline Phosphatase: 331 U/L — ABNORMAL HIGH (ref 38–126)
Anion gap: 5 (ref 5–15)
BUN: 14 mg/dL (ref 6–20)
CO2: 22 mmol/L (ref 22–32)
Calcium: 8.3 mg/dL — ABNORMAL LOW (ref 8.9–10.3)
Chloride: 95 mmol/L — ABNORMAL LOW (ref 98–111)
Creatinine, Ser: 0.82 mg/dL (ref 0.61–1.24)
GFR, Estimated: >60 mL/min (ref >60)
Glucose, Bld: 86 mg/dL (ref 70–99)
Potassium: 4.9 mmol/L (ref 3.5–5.1)
Sodium: 122 mmol/L — ABNORMAL LOW (ref 135–145)
Total Bilirubin: 7 mg/dL — ABNORMAL HIGH (ref 0.3–1.2)
Total Protein: 5.7 g/dL — ABNORMAL LOW (ref 6.5–8.1)

## 2021-04-21 LAB — OSMOLALITY: Osmolality: 259 mOsm/kg — ABNORMAL LOW (ref 275–295)

## 2021-04-21 LAB — CBC
HCT: 32.8 % — ABNORMAL LOW (ref 39.0–52.0)
Hemoglobin: 11.6 g/dL — ABNORMAL LOW (ref 13.0–17.0)
MCH: 33.5 pg (ref 26.0–34.0)
MCHC: 35.4 g/dL (ref 30.0–36.0)
MCV: 94.8 fL (ref 80.0–100.0)
Platelets: 55 10*3/uL — ABNORMAL LOW (ref 150–400)
RBC: 3.46 MIL/uL — ABNORMAL LOW (ref 4.22–5.81)
RDW: 14.9 % (ref 11.5–15.5)
WBC: 4.8 10*3/uL (ref 4.0–10.5)
nRBC: 0 % (ref 0.0–0.2)

## 2021-04-21 LAB — PROTIME-INR
INR: 1.5 — ABNORMAL HIGH (ref 0.8–1.2)
Prothrombin Time: 17.6 seconds — ABNORMAL HIGH (ref 11.4–15.2)

## 2021-04-21 LAB — SARS CORONAVIRUS 2 (TAT 6-24 HRS): SARS Coronavirus 2: NEGATIVE

## 2021-04-21 LAB — OSMOLALITY, URINE: Osmolality, Ur: 215 mOsm/kg — ABNORMAL LOW (ref 300–900)

## 2021-04-21 LAB — SODIUM, URINE, RANDOM: Sodium, Ur: 34 mmol/L

## 2021-04-21 NOTE — Telephone Encounter (Signed)
He has been admitted to the [facility] at [facility] for management of his low sodium.

## 2021-04-21 NOTE — Consult Note (Addendum)
Referring Provider: Dr. Aline August  Primary Care Physician:  Maximiano Coss, NP Primary Gastroenterologist:  Dr. Tarri Glenn   Reason for Consultation:  Cirrhosis, hyponatremia   HPI: Ian Hall is a 55 y.o. 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 He is followed by Dr. Thornton Park in our outpatient GI clinic. He underwent laboratory studies 04/20/2021 which showed worsening hyponatremia with Na+ level 124 -> 118 therefore he was sent to the ED for further evaluation.  Labs in the ED showed a sodium level of 119.  0.1.  BUN 16.  Creatinine 0.98.  Alk phos 327.  Albumin 2.7.  AST 148.  ALT 44.  Total bili 7.8.  SARS coronavirus 2 negative.  Urine sodium level 34. Furosemide 90m QD was held. Spironolactone 1019mQD was continued. GI and nephrology consults were requested.   I last saw Ian Hall clinic on 04/20/2021 for stenosis follow-up.  At that time, he remains abstinent from alcohol since 11/18/2020 he was compliant with taking his Furosemide 4058mD, Spironolactone 100m48m, Lactulose 30gm tid, Xifaxan bid, Nadolol 40mg54mand Pantoprazole 40mg 52m Nadolol was discontinued as his EGD 12/2020 did not show evidence of varices. He noted having shortness of breath with exertion. A chest CTA  04/09/2021 was negative for PE. An echo 02/24/2021 with LVEF 60-60% and grade 1 diastolic dysfunction. His last paracentesis was completed on 01/30/2021, at that time 2.4 L of peritoneal fluid was removed without SBP.  MELD Na+ 23. We planned [facility], when abstinent from alcohol x 6 months.   He underwent an EGD 12/30/2020 which identified diffuse mild mucosal changes with discoloration in the entire esophagus, biopsies were consistent with reflux. No evidence of esophageal or gastric varices.  A medium  amount of food residue was in the stomach with mild inflammation.  No evidence of H. pylori.  He underwent a colonoscopy on the same date which resulted in a poor prep.  Diverticulosis was identified to the descending and sigmoid colon, erythematous mucosa in the ascending and proximal transverse colon and hemorrhoids.  Biopsies were benign, no colitis or dysplasia.  A repeat colonoscopy was to be scheduled after he completed chest CTA and cardiac evaluation secondary to SOB with exertion.   Currently, Ian Hall asymptomatic.  He reported being compliant with all of his medications as noted above.  He follows a low-sodium diet.  No alcohol since 11/28/2020.  No NSAID use.  Weight has been stable around 203 pounds.  No abdominal swelling or edema.  No confusion.  No nausea or vomiting.  No dysphagia or heartburn.  No upper or lower abdominal pain.  He is passing a normal formed brown bowel movement daily.  No diarrhea on Lactulose.  No melena or rectal bleeding.  No chest pain.  He continues to have mild shortness of breath with exertion as noted above.  No cough or hemoptysis.  No other complaints at this time.   Chest CTA 04/09/2021 due to SOB with history of DVT: No evidence of pulmonary embolus. No acute cardiopulmonary disease. Porcelain gallbladder. Changes of cirrhosis with splenomegaly.  EGD 12/30/2020:  - Discolored mucosa in the esophagus. Biopsied. - A medium amount of food (residue) in the stomach. - Erythematous mucosa in the gastric body. Biopsied. Normal stomach. - The examination was otherwise normal.  Colonoscopy 12/30/2020: - Preparation of the colon was poor. Formed stool present throughout the colon. No meaningful evaluation for polyps could be performed today. - Hemorrhoids found on perianal exam. - Diverticulosis in the sigmoid colon and in the descending colon. - Erythematous mucosa in the ascending colon and proximal transverse colon. Biopsied. - The  examination was otherwise normal on direct and retroflexion views. Biopsy Report: 1. Surgical [P], random gastric bx - REACTIVE GASTROPATHY WITH EROSIONS. [name] IS NEGATIVE FOR HELICOBACTER PYLORI. - NO INTESTINAL METAPLASIA, DYSPLASIA, OR MALIGNANCY. 2. Surgical [P], random esophageal bx - REFLUX CHANGES. - NO INTESTINAL METAPLASIA, DYSPLASIA, OR MALIGNANCY. 3. Surgical [P], right colon bx - BENIGN COLONIC MUCOSA. - NO ACTIVE INFLAMMATION OR EVIDENCE OF MICROSCOPIC COLITIS. - NO DYSPLASIA OR MALIGNANCY   Past Medical History:  Diagnosis Date  . Anxiety   . Cirrhosis ([place])   . COVID-19     Past Surgical History:  Procedure Laterality Date  . ANKLE SURGERY Right   . IR PARACENTESIS  01/07/2021  . IR PARACENTESIS  01/30/2021  . IR TRANSCATHETER BX  01/09/2021  . IR US GUIDE VASC ACCESS RIGHT  01/09/2021  . IR VENOGRAM HEPATIC W HEMODYNAMIC EVALUATION  01/09/2021    Prior to Admission medications   Medication Sig Start Date End Date Taking? Authorizing Provider  albuterol (VENTOLIN HFA) 108 (90 Base) MCG/ACT inhaler Inhale 2 puffs into the lungs every 6 (six) hours as needed for wheezing or shortness of breath. 04/03/21  Yes Maximiano Coss, NP  azelastine (ASTELIN) 0.1 % nasal spray Place 1 spray into both nostrils daily. Use in each nostril as directed Patient taking differently: Place 1 spray into both nostrils daily as needed for allergies. Use in each nostril as directed 03/19/21  Yes Maximiano Coss, NP  dicyclomine (BENTYL) 10 MG capsule Take 1 capsule (10 mg total) by mouth 4 (four) times daily -  before meals and at bedtime. 12/16/20  Yes Thornton Park, MD  folic acid (FOLVITE) 1 MG tablet Take 1 tablet (1 mg total) by mouth daily. 03/26/21  Yes Maximiano Coss, NP  furosemide (LASIX) 40 MG tablet Take 1 tablet (40 mg total) by mouth daily. 03/01/21 05/30/21 Yes [place] Eric J, DO  lactulose (CHRONULAC) 10 GM/15ML solution Take 45 mLs (30 g total) by mouth 3 (three)  times daily. 03/01/21 05/30/21 Yes [place] Eric J, DO  meclizine (ANTIVERT) 12.5 MG tablet Take 1 tablet (12.5 mg total) by mouth daily as needed for dizziness. 03/19/21  Yes Maximiano Coss, NP  rifaximin (XIFAXAN) 550 MG TABS tablet Take 1 tablet (550 mg total) by mouth 2 (two) times daily. 03/01/21 05/30/21 Yes [place] Donnamarie Poag, DO  spironolactone (ALDACTONE) 100 MG tablet Take 1 tablet (100 mg total) by mouth daily. 03/01/21 05/30/21 Yes [place] Eric J, DO  nadolol (CORGARD) 20 MG tablet Take 1 tablet (20 mg total) by mouth daily. Patient not taking: Reported on 04/20/2021 03/10/21   Maximiano Coss, NP  pantoprazole (PROTONIX) 40 MG tablet Take 1 tablet (40 mg total) by mouth 2 (two) times daily. Patient not taking: Reported on 04/20/2021 04/16/21 05/16/21  Maximiano Coss, NP    Current Facility-Administered Medications  Medication Dose Route Frequency Provider Last Rate Last Admin  . albuterol (VENTOLIN HFA) 108 (90 Base) MCG/ACT inhaler 2 puff  2 puff Inhalation Q6H PRN Garba, Mohammad L, MD      . azelastine (ASTELIN) 0.1 % nasal spray 1 spray  1 spray Each Nare Daily PRN Elwyn Reach, MD      .  dicyclomine (BENTYL) capsule 10 mg  10 mg Oral TID AC & HS Elwyn Reach, MD   10 mg at 04/21/21 0747  . folic acid (FOLVITE) tablet 1 mg  1 mg Oral Daily Elwyn Reach, MD   1 mg at 04/21/21 0958  . lactulose (CHRONULAC) 10 GM/15ML solution 30 g  30 g Oral TID Elwyn Reach, MD   30 g at 04/21/21 0959  . LORazepam (ATIVAN) tablet 1-4 mg  1-4 mg Oral Q1H PRN Elwyn Reach, MD       Or  . LORazepam (ATIVAN) injection 1-4 mg  1-4 mg Intravenous Q1H PRN Elwyn Reach, MD      . meclizine (ANTIVERT) tablet 12.5 mg  12.5 mg Oral Daily PRN Elwyn Reach, MD      . multivitamin with minerals tablet 1 tablet  1 tablet Oral Daily Elwyn Reach, MD   1 tablet at 04/21/21 0958  . ondansetron (ZOFRAN) tablet 4 mg  4 mg Oral Q6H PRN Elwyn Reach, MD       Or  . ondansetron (ZOFRAN)  injection 4 mg  4 mg Intravenous Q6H PRN Gala Romney L, MD      . rifaximin (XIFAXAN) tablet 550 mg  550 mg Oral BID Elwyn Reach, MD   550 mg at 04/21/21 1005  . spironolactone (ALDACTONE) tablet 100 mg  100 mg Oral Daily Gala Romney L, MD   100 mg at 04/21/21 0958  . thiamine tablet 100 mg  100 mg Oral Daily Gala Romney L, MD   100 mg at 04/21/21 7035   Or  . thiamine (B-1) injection 100 mg  100 mg Intravenous Daily Elwyn Reach, MD        Allergies as of 04/20/2021 - Review Complete 04/20/2021  Allergen Reaction Noted  . Hydrocodone Itching and Other (See Comments) 08/26/2015    Family History  Problem Relation Age of Onset  . Diabetes Mother   . Memory loss Mother   . Diabetes Father   . Diabetes Cousin   . Colon cancer Neg Hx     Social History   Socioeconomic History  . Marital status: Married    Spouse name: Not on file  . Number of children: 0  . Years of education: Not on file  . Highest education level: Not on file  Occupational History  . Occupation: IT sales professional: [employer]  Tobacco Use  . Smoking status: Former Smoker    Types: Cigarettes    Quit date: 12/16/2002    Years since quitting: 18.3  . Smokeless tobacco: Never Used  Vaping Use  . Vaping Use: Never used  Substance and Sexual Activity  . Alcohol use: Not Currently    Comment: none for 3-4 years (stated 12/16/2020)  . Drug use: No  . Sexual activity: Yes    Birth control/protection: None  Other Topics Concern  . Not on file  Social History Narrative  . Not on file   Social Determinants of Health   Financial Resource Strain: Not on file  Food Insecurity: Not on file  Transportation Needs: Not on file  Physical Activity: Not on file  Stress: Not on file  Social Connections: Not on file  Intimate Partner Violence: Not on file    Review of Systems: Gen: Denies fever, sweats or chills. No weight loss.  CV: Denies chest pain, palpitations or  edema. Resp: See HPI.   GI: See HPI.  GU : Denies urinary burning, blood in urine, increased urinary frequency or incontinence. MS: Denies joint pain, muscles aches or weakness. Derm: Denies rash, itchiness, skin lesions or unhealing ulcers. Psych: Denies depression, anxiety, memory loss, suicidal ideation or confusion. Heme: Denies easy bruising, bleeding. Neuro:  Denies headaches, dizziness or paresthesias. Endo:  Denies any problems with DM, thyroid or adrenal function.  Physical Exam: Vital signs in last 24 hours: Temp:  [97.6 F (36.4 C)-98.1 F (36.7 C)] 97.6 F (36.4 C) (05/24 0828) Pulse Rate:  [68-85] 71 (05/24 0828) Resp:  [15-25] 16 (05/24 0828) BP: (106-130)/(65-84) 106/65 (05/24 0828) SpO2:  [97 %-100 %] 100 % (05/24 0828)   General:  Alert 55 year old Hispanic male in no acute distress. Head:  Normocephalic and atraumatic. Eyes:  Scleral icterus present. Conjunctiva pink. Ears:  Normal auditory acuity. Nose:  No deformity, discharge or lesions. Mouth:  Dentition intact. No ulcers or lesions.  Neck:  Supple. No lymphadenopathy or thyromegaly.  Lungs: Breath sounds clear throughout. Heart: Regular rate and rhythm, no murmurs. Abdomen: Soft, mildly protuberant without ascites.  Abdomen is not tense.  Nontender.  Positive bowel sounds all 4 quadrants.  No hepatomegaly. Rectal: Deferred. Musculoskeletal:  Symmetrical without gross deformities.  Pulses:  Normal pulses noted. Extremities:  Without clubbing or edema. Neurologic:  Alert and  oriented x4. No focal deficits.  No asterixis. Skin:  Intact without significant lesions or rashes.  Mild jaundice. Psych:  Alert and cooperative. Normal mood and affect.  Intake/Output from previous day: 05/23 0701 - 05/24 0700 In: 1105.4 [P.O.:720; I.V.:385.4] Out: 1250 [Urine:1250] Intake/Output this shift: Total I/O In: 376 [P.O.:376] Out: -   Lab Results: Recent Labs    04/20/21 1610 04/20/21 2215 04/21/21 0502  WBC  6.4 5.8 4.8  HGB 12.0* 11.6* 11.6*  HCT 33.7* 32.8* 32.8*  PLT 62* 56* 55*   BMET Recent Labs    04/20/21 1610 04/20/21 2215 04/21/21 0502  NA 119* 121* 122*  K 5.1 4.7 4.9  CL 92* 94* 95*  CO2 21* 22 22  GLUCOSE 108* 108* 86  BUN 16 17 14   CREATININE 0.98 0.86 0.82  CALCIUM 8.4* 8.2* 8.3*   LFT Recent Labs    04/21/21 0502  PROT 5.7*  ALBUMIN 2.5*  AST 149*  ALT 42  ALKPHOS 331*  BILITOT 7.0*   IMPRESSION/PLAN:  35. 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 Liver transplant evaluation with [facility]  recommended June 20222 when abstinent from a alcohol x 6 months. He was admitted to the hospital 04/20/2021 due to worsening hyponatremia, likely due to diuretics.  He does not clinically appear fluid overloaded.  Na+ 118 -> 119 -> 121 -> 122. Urine Na+ 34. Urine osmolality 215. Furosemide discontinued. Remains on Spironolactone 179m QD. Alk Phos 331. T. Bili 7.0. AST 149. ALT 42.  -Continue to hold Furosemide -Ok to continue Spironolactone 1070mQD, will hold if sodium level decreases as discussed with nephrologist Dr. LiAugustin CoupeBMP, hepatic panel in am -Continue Lactulose 30gm po tid and Xifaxan 55063mo bid  -Continue Folic Acid, MVI and Thiamine  -INR -Recalculate MELD score after INR completed  -Plan to refer patient to [facility] 04/2021 when abstinent from alcohol x 6 months  -Further recommendations per Dr. JacArdis Hughs2. Chronic normocytic anemia. Hg 11.6. No overt GI bleeding.   3. Thrombocytopenia secondary to cirrhosis and splenomegaly  4. GERD - Continue  Pantoprazole 54m po QD  5.  Colon cancer screening. Poor prep with colonoscopy 12/2020.  -Eventual outpatient colonoscopy  6. SOB with exertion. Negative chest CTA 04/09/2021 -Outpatient cardiac evaluation recommended   7. History of DVT not on anticoagulation   CNoralyn Pick  04/21/2021, 11:53 AM   ________________________________________________________________________  LVelora HecklerGI MD note:  I personally examined the patient, reviewed the data and agree with the assessment and plan described above.  55yo man with decompensated cirrhosis (encephalopathy, controlled on xifaxan, latulose).  Admitted with asymptomatic hyponatremia. Na already a bit better this morning.  Greatly appreciate renal advice with him.  I expect we'll find a nice diuretic regimen that controls his cirrhosis related edema, ascites while keeping his electrolytes within normal range.  Will follow along.   DOwens Loffler MD [facility] Pager [phone]

## 2021-04-21 NOTE — Progress Notes (Signed)
Patient ID: Ian Hall, male   DOB: June 25, 1966, 54 y.o.   MRN: [id number]  PROGRESS NOTE    Ian Hall  OEU:[id number] DOB: 09-24-1966 DOA: 04/20/2021 PCP: Maximiano Coss, NP   Brief Narrative:  55 year old male with history of alcoholic cirrhosis, recent COVID-19 infection, anxiety disorder, history of DVT not on anticoagulation because of risks of bleeding, chronic hyponatremia presented to GI office with weakness and was found to have sodium of 118.  He was sent to ED for subsequent management.  On presentation, sodium was 118, creatinine of 1.04, total bilirubin of 7.8, alkaline phosphatase 327, ALT of 148 and AST of 44.  He was started on gentle hydration.  Assessment & Plan:   Acute on chronic hyponatremia -Serum sodium in the 129-132 and 02/2021 -On Lasix and spironolactone at home.  Held on presentation.  Presented with sodium of 118.  No signs of volume overload at the time of presentation.  Was treated with gentle hydration with normal saline.  Sodium 122 this morning.  Still no signs of fluid overload.  Will DC IV fluids for now.  I have consulted nephrology/Dr. Augustin Coupe.  Follow-up nephrology recommendations.  Monitor sodium.  Decompensated alcoholic cirrhosis of liver Elevated LFTs with hyperbilirubinemia as well Chronic thrombocytopenia History of alcohol abuse History of hepatic encephalopathy -Patient has been abstinent from alcohol for more than 5 months now. -Follows up with GI as an outpatient. -Continue rifaximin/lactulose. -Continue with thiamine, multivitamin and folic acid.  Anemia of chronic disease -Probably from above.  Hemoglobin stable.  History of DVT -Not on any treatment because of thrombocytopenia and increased risks of bleeding.  Hypomagnesemia -Replace.  Repeat a.m. labs  DVT prophylaxis: SCDs Code Status: Full Family Communication: None at bedside Disposition Plan: Status is: Inpatient  Remains inpatient appropriate because:Inpatient level of  care appropriate due to severity of illness   Dispo: The patient is from: Home              Anticipated d/c is to: Home in 1 to 2 days once sodium improves and cleared by nephrology/GI              Patient currently is not medically stable to d/c.   Difficult to place patient No  Consultants: GI/nephrology  Procedures: None  Antimicrobials: None    Subjective: Patient seen and examined at bedside.  Still feels weak but feels slightly better.  Denies worsening abdominal distention or leg swelling.  Denies abdominal pain, nausea or vomiting or fevers.  Objective: Vitals:   04/20/21 2023 04/21/21 0024 04/21/21 0450 04/21/21 0828  BP: 123/66 121/71 118/71 106/65  Pulse: 74 79 68 71  Resp: (!) 21 (!) 21 20 16   Temp: 98.1 F (36.7 C) 98 F (36.7 C) 97.6 F (36.4 C) 97.6 F (36.4 C)  TempSrc: Oral Oral Oral Oral  SpO2: 100% 97% 99% 100%    Intake/Output Summary (Last 24 hours) at 04/21/2021 1123 Last data filed at 04/21/2021 0830 Gross per 24 hour  Intake 1481.43 ml  Output 1250 ml  Net 231.43 ml   There were no vitals filed for this visit.  Examination:  General exam: Appears calm and comfortable.  Looks chronically ill and deconditioned.  Currently on room air Respiratory system: Bilateral decreased breath sounds at bases Cardiovascular system: S1 & S2 heard, Rate controlled Gastrointestinal system: Abdomen is distended, soft and nontender. Normal bowel sounds heard. Extremities: No cyanosis, clubbing; trace lower extremity edema Central nervous system: Alert and oriented. No focal neurological  deficits. Moving extremities Skin: No rashes, lesions or ulcers Psychiatry: Flat affect.    Data Reviewed: I have personally reviewed following labs and imaging studies  CBC: Recent Labs  Lab 04/17/21 1336 04/17/21 1453 04/20/21 1610 04/20/21 2215 04/21/21 0502  WBC 6.2  --  6.4 5.8 4.8  NEUTROABS 4.1  --  3.9  --   --   HGB 12.4* 11.9* 12.0* 11.6* 11.6*  HCT 34.9*  35.0* 33.7* 32.8* 32.8*  MCV 93.8  --  93.6 94.0 94.8  PLT 61*  --  62* 56* 55*   Basic Metabolic Panel: Recent Labs  Lab 04/17/21 1336 04/17/21 1453 04/20/21 1000 04/20/21 1610 04/20/21 2215 04/21/21 0502  NA 123* 124* 118* 119* 121* 122*  K 4.3 4.8 4.8 5.1 4.7 4.9  CL 96* 93* 91* 92* 94* 95*  CO2 19*  --  19 21* 22 22  GLUCOSE 102* 122* 145* 108* 108* 86  BUN 20 22* 15 16 17 14   CREATININE 1.08 1.20 1.04 0.98 0.86 0.82  CALCIUM 8.5*  --  8.2* 8.4* 8.2* 8.3*  MG  --   --   --   --  1.6*  --   PHOS  --   --   --   --  3.2  --    GFR: Estimated Creatinine Clearance: 111.7 mL/min (by C-G formula based on SCr of 0.82 mg/dL). Liver Function Tests: Recent Labs  Lab 04/17/21 1336 04/20/21 1610 04/20/21 2215 04/21/21 0502  AST 152* 148* 142* 149*  ALT 48* 44 39 42  ALKPHOS 347* 327* 320* 331*  BILITOT 8.1* 7.8* 7.4* 7.0*  PROT 6.1* 6.2* 5.9* 5.7*  ALBUMIN 2.7* 2.7* 2.5* 2.5*   Recent Labs  Lab 04/20/21 1610  LIPASE 63*   No results for input(s): AMMONIA in the last 168 hours. Coagulation Profile: No results for input(s): INR, PROTIME in the last 168 hours. Cardiac Enzymes: No results for input(s): CKTOTAL, CKMB, CKMBINDEX, TROPONINI in the last 168 hours. BNP (last 3 results) No results for input(s): PROBNP in the last 8760 hours. HbA1C: No results for input(s): HGBA1C in the last 72 hours. CBG: No results for input(s): GLUCAP in the last 168 hours. Lipid Profile: No results for input(s): CHOL, HDL, LDLCALC, TRIG, CHOLHDL, LDLDIRECT in the last 72 hours. Thyroid Function Tests: No results for input(s): TSH, T4TOTAL, FREET4, T3FREE, THYROIDAB in the last 72 hours. Anemia Panel: No results for input(s): VITAMINB12, FOLATE, FERRITIN, TIBC, IRON, RETICCTPCT in the last 72 hours. Sepsis Labs: No results for input(s): PROCALCITON, LATICACIDVEN in the last 168 hours.  Recent Results (from the past 240 hour(s))  SARS CORONAVIRUS 2 (TAT 6-24 HRS) Nasopharyngeal  Nasopharyngeal Swab     Status: None   Collection Time: 04/20/21  6:32 PM   Specimen: Nasopharyngeal Swab  Result Value Ref Range Status   SARS Coronavirus 2 NEGATIVE NEGATIVE Final    Comment: (NOTE) SARS-CoV-2 target nucleic acids are NOT DETECTED.  The SARS-CoV-2 RNA is generally detectable in upper and lower respiratory specimens during the acute phase of infection. Negative results do not preclude SARS-CoV-2 infection, do not rule out co-infections with other pathogens, and should not be used as the sole basis for treatment or other patient management decisions. Negative results must be combined with clinical observations, patient history, and epidemiological information. The expected result is Negative.  Fact Sheet for Patients: SugarRoll.be  Fact Sheet for Healthcare Providers: https://www.woods-mathews.com/  This test is not yet approved or cleared by the [place] FDA  and  has been authorized for detection and/or diagnosis of SARS-CoV-2 by FDA under an Emergency Use Authorization (EUA). This EUA will remain  in effect (meaning this test can be used) for the duration of the COVID-19 declaration under Se ction 564(b)(1) of the Act, 21 U.S.C. section 360bbb-3(b)(1), unless the authorization is terminated or revoked sooner.  Performed at [facility], [place] [address], [place], [place] [address]          Radiology Studies: No results found.      Scheduled Meds: . dicyclomine  10 mg Oral TID AC & HS  . folic acid  1 mg Oral Daily  . lactulose  30 g Oral TID  . multivitamin with minerals  1 tablet Oral Daily  . rifaximin  550 mg Oral BID  . spironolactone  100 mg Oral Daily  . thiamine  100 mg Oral Daily   Or  . thiamine  100 mg Intravenous Daily   Continuous Infusions:        Aline August, MD [facility] 04/21/2021, 11:23 AM

## 2021-04-21 NOTE — Telephone Encounter (Signed)
[name], Please  re-initiate a referral for Mr. Ian Hall to see the [facility] or facilitate scheduling a follow up appointment there. He remains abstinent from alcohol since 11/28/2020 so hopefully he will be considered for a  liver transplant in the near future. He was seen by Roosevelt Locks NP at [facility] on 02/17/2021 and he was due for a follow up appointment in 3 months which was not done. He is currently admitted to [facility] due to hyponatremia which has improved but likely will be discharged home in a few days. Thank you.

## 2021-04-21 NOTE — Telephone Encounter (Signed)
..  Type of form received:  Disability Additional comments:   Received by:  Tye Savoy should be Faxed to:[phone] Form should be mailed to:    Is patient requesting call for pickup:   Form placed:  In Richard's bin up front  Attach charge sheet.  Provider will determine charge.  Individual made aware of 3-5 business day turn around (Y/N)?

## 2021-04-21 NOTE — Progress Notes (Signed)
Patient ambulated in the hallway independently.

## 2021-04-21 NOTE — Telephone Encounter (Signed)
Noted. Thanks to all for the help.

## 2021-04-21 NOTE — Consult Note (Addendum)
Reason for Consult: Hyponatremia Referring Physician:  Dr. Starla Link  Chief Complaint: Weakness  Assessment/Plan: 1. Hyponatremia : acute on chronic with SNa in the 129-132 range in 02/2021. I agree he appears to be relatively euvolemic and the drop may be associated with prerenal azotemia superimposed on liver cirrhosis. No NSAIDs or SSRI's. - Will check a TSH, cortisol to complete the workup. - Interestingly urine Osm was 466 a few days ago and now 215 likely from continued use of Lasix while he was at home. - Agree with holding Lasix for now; ok with continuing Spironolactone as long as SNa is uptrending. Hopefully we can restart Lasix in the next 48hrs. - No indication for 3% or tolvaptan especially in a known cirrhotic.   2. Cirrhosis : he has not had any alcohol since 10/2020 but has elevated LFT's and hyperbili. 3. H/o COVID : certainly possible that the pulmonary process could be contributing to the hyponatremia in the past  but currently no pulmonary symptoms.  HPI: Ian Hall is an 55 y.o. male alcoholic cirrhosis, recent COVID, anxiety d/o, h/o DVT with chronic hyponatremia presenting with issues with hyponatremia. He did complain of dizziness and feeling weak as well but denies any falls or head trauma. He also denies any dyspnea, cough, fever, chills or rigors. He has cirrhosis followed by GI and was to be evaluated for a liver transplant but unfortunately he relapses in December with alcohol. He denies any further alcohol ingestion since then. Usually his SNa is in the 120's but it had dropped to the high teens and the patient was sent to the ED where the SNa was confirmed to be 118. Total bilirubin is 7.8. He is on Lasix 31m daily at home and spirinolactone 1031mdaily.  His SNa was 127 on 5/13 and then 124 on 5/20 but during this hospitalization the SNa has steadily increased to 122 on the day of consultation.   ROS Pertinent items are noted in HPI.  Chemistry and CBC: Creatinine   Date/Time Value Ref Range Status  10/29/2020 01:44 PM 0.86 0.61 - 1.24 mg/dL Final   Creat  Date/Time Value Ref Range Status  01/01/2016 11:19 AM 1.01 0.60 - 1.35 mg/dL Final  09/19/2012 09:40 AM 1.06 0.50 - 1.35 mg/dL Final   Creatinine, Ser  Date/Time Value Ref Range Status  04/21/2021 05:02 AM 0.82 0.61 - 1.24 mg/dL Final  04/20/2021 10:15 PM 0.86 0.61 - 1.24 mg/dL Final  04/20/2021 04:10 PM 0.98 0.61 - 1.24 mg/dL Final  04/20/2021 10:00 AM 1.04 0.40 - 1.50 mg/dL Final  04/17/2021 02:53 PM 1.20 0.61 - 1.24 mg/dL Final  04/17/2021 01:36 PM 1.08 0.61 - 1.24 mg/dL Final  04/17/2021 09:38 AM 1.04 0.40 - 1.50 mg/dL Final  04/10/2021 10:09 AM 1.22 0.40 - 1.50 mg/dL Final  03/31/2021 09:49 AM 1.13 0.40 - 1.50 mg/dL Final  03/17/2021 02:42 PM 1.33 0.40 - 1.50 mg/dL Final  03/10/2021 12:38 PM 1.04 0.40 - 1.50 mg/dL Final  03/01/2021 02:36 AM 0.86 0.61 - 1.24 mg/dL Final  02/28/2021 02:46 AM 0.72 0.61 - 1.24 mg/dL Final  02/27/2021 02:46 AM 0.71 0.61 - 1.24 mg/dL Final  02/26/2021 02:16 AM 1.05 0.61 - 1.24 mg/dL Final  02/25/2021 12:32 AM 1.25 (H) 0.61 - 1.24 mg/dL Final  02/24/2021 06:07 AM 0.90 0.61 - 1.24 mg/dL Final  02/24/2021 05:48 AM 1.11 0.61 - 1.24 mg/dL Final  02/11/2021 01:25 AM 0.91 0.61 - 1.24 mg/dL Final  02/09/2021 02:48 AM 0.88 0.61 - 1.24 mg/dL Final  02/08/2021 04:12 AM 0.84 0.61 - 1.24 mg/dL Final  02/07/2021 08:58 AM 0.79 0.61 - 1.24 mg/dL Final  02/07/2021 02:22 AM 0.75 0.61 - 1.24 mg/dL Final  02/06/2021 01:41 PM 1.06 0.61 - 1.24 mg/dL Final  02/04/2021 03:57 PM 0.82 0.76 - 1.27 mg/dL Final  01/29/2021 11:27 AM 0.91 0.40 - 1.50 mg/dL Final  01/16/2021 02:21 PM 0.98 0.40 - 1.50 mg/dL Final  01/14/2021 04:43 PM 0.90 0.76 - 1.27 mg/dL Final  01/09/2021 03:25 AM 0.95 0.61 - 1.24 mg/dL Final  01/08/2021 02:13 AM 0.94 0.61 - 1.24 mg/dL Final  01/07/2021 08:04 AM 0.76 0.61 - 1.24 mg/dL Final  01/06/2021 07:26 PM 0.83 0.61 - 1.24 mg/dL Final  01/05/2021 09:40 AM  0.83 0.40 - 1.50 mg/dL Final  12/29/2020 02:12 AM 0.81 0.61 - 1.24 mg/dL Final  12/28/2020 09:18 AM 0.72 0.61 - 1.24 mg/dL Final  12/27/2020 12:46 AM 0.81 0.61 - 1.24 mg/dL Final  12/26/2020 10:13 AM 0.86 0.61 - 1.24 mg/dL Final  12/24/2020 01:33 PM 0.80 0.40 - 1.50 mg/dL Final  12/22/2020 10:45 AM 0.94 0.40 - 1.50 mg/dL Final  12/16/2020 03:28 PM 0.92 0.40 - 1.50 mg/dL Final  10/17/2020 05:16 PM 0.70 (L) 0.76 - 1.27 mg/dL Final  07/07/2017 05:57 PM 0.83 0.76 - 1.27 mg/dL Final  01/17/2017 10:26 AM 0.87 0.76 - 1.27 mg/dL Final   Recent Labs  Lab 04/17/21 0938 04/17/21 1336 04/17/21 1453 04/20/21 1000 04/20/21 1610 04/20/21 2215 04/21/21 0502  NA 119* 123* 124* 118* 119* 121* 122*  K 4.6 4.3 4.8 4.8 5.1 4.7 4.9  CL 91* 96* 93* 91* 92* 94* 95*  CO2 19 19*  --  19 21* 22 22  GLUCOSE 104* 102* 122* 145* 108* 108* 86  BUN 16 20 22* 15 16 17 14   CREATININE 1.04 1.08 1.20 1.04 0.98 0.86 0.82  CALCIUM 8.7 8.5*  --  8.2* 8.4* 8.2* 8.3*  PHOS  --   --   --   --   --  3.2  --    Recent Labs  Lab 04/17/21 1336 04/17/21 1453 04/20/21 1610 04/20/21 2215 04/21/21 0502  WBC 6.2  --  6.4 5.8 4.8  NEUTROABS 4.1  --  3.9  --   --   HGB 12.4* 11.9* 12.0* 11.6* 11.6*  HCT 34.9* 35.0* 33.7* 32.8* 32.8*  MCV 93.8  --  93.6 94.0 94.8  PLT 61*  --  62* 56* 55*   Liver Function Tests: Recent Labs  Lab 04/20/21 1610 04/20/21 2215 04/21/21 0502  AST 148* 142* 149*  ALT 44 39 42  ALKPHOS 327* 320* 331*  BILITOT 7.8* 7.4* 7.0*  PROT 6.2* 5.9* 5.7*  ALBUMIN 2.7* 2.5* 2.5*   Recent Labs  Lab 04/20/21 1610  LIPASE 63*   No results for input(s): AMMONIA in the last 168 hours. Cardiac Enzymes: No results for input(s): CKTOTAL, CKMB, CKMBINDEX, TROPONINI in the last 168 hours. Iron Studies: No results for input(s): IRON, TIBC, TRANSFERRIN, FERRITIN in the last 72 hours. PT/INR: @LABRCNTIP (inr:5)  Xrays/Other Studies: ) Results for orders placed or performed during the hospital  encounter of 04/20/21 (from the past 48 hour(s))  Comprehensive metabolic panel     Status: Abnormal   Collection Time: 04/20/21  4:10 PM  Result Value Ref Range   Sodium 119 (LL) 135 - 145 mmol/L    Comment: CRITICAL RESULT CALLED TO, READ BACK BY AND VERIFIED WITH: R GROVES AT 1716 ON 04/20/2021 BY MOSLEY,J    Potassium 5.1 3.5 -  5.1 mmol/L   Chloride 92 (L) 98 - 111 mmol/L   CO2 21 (L) 22 - 32 mmol/L   Glucose, Bld 108 (H) 70 - 99 mg/dL    Comment: Glucose reference range applies only to samples taken after fasting for at least 8 hours.   BUN 16 6 - 20 mg/dL   Creatinine, Ser 0.98 0.61 - 1.24 mg/dL   Calcium 8.4 (L) 8.9 - 10.3 mg/dL   Total Protein 6.2 (L) 6.5 - 8.1 g/dL   Albumin 2.7 (L) 3.5 - 5.0 g/dL   AST 148 (H) 15 - 41 U/L   ALT 44 0 - 44 U/L   Alkaline Phosphatase 327 (H) 38 - 126 U/L   Total Bilirubin 7.8 (H) 0.3 - 1.2 mg/dL   GFR, Estimated >60 >60 mL/min    Comment: (NOTE) Calculated using the CKD-EPI Creatinine Equation (2021)    Anion gap 6 5 - 15    Comment: Performed at [facility], [place] [address] [place], [place] [address]  CBC with Differential     Status: Abnormal   Collection Time: 04/20/21  4:10 PM  Result Value Ref Range   WBC 6.4 4.0 - 10.5 K/uL   RBC 3.60 (L) 4.22 - 5.81 MIL/uL   Hemoglobin 12.0 (L) 13.0 - 17.0 g/dL   HCT 33.7 (L) 39.0 - 52.0 %   MCV 93.6 80.0 - 100.0 fL   MCH 33.3 26.0 - 34.0 pg   MCHC 35.6 30.0 - 36.0 g/dL   RDW 14.8 11.5 - 15.5 %   Platelets 62 (L) 150 - 400 K/uL    Comment: SPECIMEN CHECKED FOR CLOTS Immature Platelet Fraction may be clinically indicated, consider ordering this additional test [id number] REPEATED TO VERIFY PLATELET COUNT CONFIRMED BY SMEAR    nRBC 0.0 0.0 - 0.2 %   Neutrophils Relative % 62 %   Neutro Abs 3.9 1.7 - 7.7 K/uL   Lymphocytes Relative 22 %   Lymphs Abs 1.4 0.7 - 4.0 K/uL   Monocytes Relative 13 %   Monocytes Absolute 0.8 0.1 - 1.0 K/uL   Eosinophils Relative 1 %    Eosinophils Absolute 0.1 0.0 - 0.5 K/uL   Basophils Relative 1 %   Basophils Absolute 0.0 0.0 - 0.1 K/uL   Immature Granulocytes 1 %   Abs Immature Granulocytes 0.08 (H) 0.00 - 0.07 K/uL    Comment: Performed at [facility], [place] [address] [place], [place] [address]  Osmolality     Status: Abnormal   Collection Time: 04/20/21  4:10 PM  Result Value Ref Range   Osmolality 259 (L) 275 - 295 mOsm/kg    Comment: Performed at [facility], [place] [address] [place],  [address]  Lipase, blood     Status: Abnormal   Collection Time: 04/20/21  4:10 PM  Result Value Ref Range   Lipase 63 (H) 11 - 51 U/L    Comment: Performed at [facility], [place] [address] [place], [place] [address]  SARS CORONAVIRUS 2 (TAT 6-24 HRS) Nasopharyngeal Nasopharyngeal Swab     Status: None   Collection Time: 04/20/21  6:32 PM   Specimen: Nasopharyngeal Swab  Result Value Ref Range   SARS Coronavirus 2 NEGATIVE NEGATIVE    Comment: (NOTE) SARS-CoV-2 target nucleic acids are NOT DETECTED.  The SARS-CoV-2 RNA is generally detectable in upper and lower respiratory specimens during the acute phase of infection. Negative results do not preclude SARS-CoV-2 infection, do not  rule out co-infections with other pathogens, and should not be used as the sole basis for treatment or other patient management decisions. Negative results must be combined with clinical observations, patient history, and epidemiological information. The expected result is Negative.  Fact Sheet for Patients: SugarRoll.be  Fact Sheet for Healthcare Providers: https://www.woods-mathews.com/  This test is not yet approved or cleared by the [place] FDA and  has been authorized for detection and/or diagnosis of SARS-CoV-2 by FDA under an Emergency Use Authorization (EUA). This EUA will remain  in effect (meaning this test can be used) for the duration  of the COVID-19 declaration under Se ction 564(b)(1) of the Act, 21 U.S.C. section 360bbb-3(b)(1), unless the authorization is terminated or revoked sooner.  Performed at [facility], [place] [address] [place], [place] [address]   Comprehensive metabolic panel     Status: Abnormal   Collection Time: 04/20/21 10:15 PM  Result Value Ref Range   Sodium 121 (L) 135 - 145 mmol/L   Potassium 4.7 3.5 - 5.1 mmol/L   Chloride 94 (L) 98 - 111 mmol/L   CO2 22 22 - 32 mmol/L   Glucose, Bld 108 (H) 70 - 99 mg/dL    Comment: Glucose reference range applies only to samples taken after fasting for at least 8 hours.   BUN 17 6 - 20 mg/dL   Creatinine, Ser 0.86 0.61 - 1.24 mg/dL   Calcium 8.2 (L) 8.9 - 10.3 mg/dL   Total Protein 5.9 (L) 6.5 - 8.1 g/dL   Albumin 2.5 (L) 3.5 - 5.0 g/dL   AST 142 (H) 15 - 41 U/L   ALT 39 0 - 44 U/L   Alkaline Phosphatase 320 (H) 38 - 126 U/L   Total Bilirubin 7.4 (H) 0.3 - 1.2 mg/dL   GFR, Estimated >60 >60 mL/min    Comment: (NOTE) Calculated using the CKD-EPI Creatinine Equation (2021)    Anion gap 5 5 - 15    Comment: Performed at [facility], [place] [address], [place], [place] [address]  Magnesium     Status: Abnormal   Collection Time: 04/20/21 10:15 PM  Result Value Ref Range   Magnesium 1.6 (L) 1.7 - 2.4 mg/dL    Comment: Performed at [facility], [place] [address], [place], [place] [address]  Phosphorus     Status: None   Collection Time: 04/20/21 10:15 PM  Result Value Ref Range   Phosphorus 3.2 2.5 - 4.6 mg/dL    Comment: Performed at [facility], [place] [address], [place], [place] [address]  CBC     Status: Abnormal   Collection Time: 04/20/21 10:15 PM  Result Value Ref Range   WBC 5.8 4.0 - 10.5 K/uL   RBC 3.49 (L) 4.22 - 5.81 MIL/uL   Hemoglobin 11.6 (L) 13.0 - 17.0 g/dL   HCT 32.8 (L) 39.0 - 52.0 %   MCV 94.0 80.0 - 100.0 fL   MCH 33.2 26.0 - 34.0 pg   MCHC 35.4 30.0 - 36.0 g/dL    RDW 14.7 11.5 - 15.5 %   Platelets 56 (L) 150 - 400 K/uL    Comment: SPECIMEN CHECKED FOR CLOTS Immature Platelet Fraction may be clinically indicated, consider ordering this additional test [id number] CONSISTENT WITH PREVIOUS RESULT REPEATED TO VERIFY    nRBC 0.0 0.0 - 0.2 %    Comment: Performed at [facility], [place] [address], [place], [place] [address]  Urinalysis, Routine w reflex  microscopic Urine, Clean Catch     Status: Abnormal   Collection Time: 04/21/21  4:57 AM  Result Value Ref Range   Color, Urine YELLOW YELLOW   APPearance CLEAR CLEAR   Specific Gravity, Urine 1.005 1.005 - 1.030   pH 7.0 5.0 - 8.0   Glucose, UA NEGATIVE NEGATIVE mg/dL   Hgb urine dipstick SMALL (A) NEGATIVE   Bilirubin Urine NEGATIVE NEGATIVE   Ketones, ur NEGATIVE NEGATIVE mg/dL   Protein, ur NEGATIVE NEGATIVE mg/dL   Nitrite NEGATIVE NEGATIVE   Leukocytes,Ua NEGATIVE NEGATIVE   RBC / HPF 0-5 0 - 5 RBC/hpf   Bacteria, UA NONE SEEN NONE SEEN   Sperm, UA PRESENT     Comment: Performed at [facility], [place] [address] [place], [place] [address]  Osmolality, urine     Status: Abnormal   Collection Time: 04/21/21  4:58 AM  Result Value Ref Range   Osmolality, Ur 215 (L) 300 - 900 mOsm/kg    Comment: Performed at [place] [address], [place], [place] [address]  Sodium, urine, random     Status: None   Collection Time: 04/21/21  4:58 AM  Result Value Ref Range   Sodium, Ur 34 mmol/L    Comment: Performed at [facility], [place] [address] [place], [place] [address]  CBC     Status: Abnormal   Collection Time: 04/21/21  5:02 AM  Result Value Ref Range   WBC 4.8 4.0 - 10.5 K/uL   RBC 3.46 (L) 4.22 - 5.81 MIL/uL   Hemoglobin 11.6 (L) 13.0 - 17.0 g/dL   HCT 32.8 (L) 39.0 - 52.0 %   MCV 94.8 80.0 - 100.0 fL   MCH 33.5 26.0 - 34.0 pg   MCHC 35.4 30.0 - 36.0 g/dL   RDW 14.9 11.5 - 15.5 %   Platelets 55 (L) 150 - 400 K/uL     Comment: Immature Platelet Fraction may be clinically indicated, consider ordering this additional test [id number] CONSISTENT WITH PREVIOUS RESULT    nRBC 0.0 0.0 - 0.2 %    Comment: Performed at [facility], [place] [address] [place], [place] [address]  Comprehensive metabolic panel     Status: Abnormal   Collection Time: 04/21/21  5:02 AM  Result Value Ref Range   Sodium 122 (L) 135 - 145 mmol/L   Potassium 4.9 3.5 - 5.1 mmol/L   Chloride 95 (L) 98 - 111 mmol/L   CO2 22 22 - 32 mmol/L   Glucose, Bld 86 70 - 99 mg/dL    Comment: Glucose reference range applies only to samples taken after fasting for at least 8 hours.   BUN 14 6 - 20 mg/dL   Creatinine, Ser 0.82 0.61 - 1.24 mg/dL   Calcium 8.3 (L) 8.9 - 10.3 mg/dL   Total Protein 5.7 (L) 6.5 - 8.1 g/dL   Albumin 2.5 (L) 3.5 - 5.0 g/dL   AST 149 (H) 15 - 41 U/L   ALT 42 0 - 44 U/L   Alkaline Phosphatase 331 (H) 38 - 126 U/L   Total Bilirubin 7.0 (H) 0.3 - 1.2 mg/dL   GFR, Estimated >60 >60 mL/min    Comment: (NOTE) Calculated using the CKD-EPI Creatinine Equation (2021)    Anion gap 5 5 - 15    Comment: Performed at [facility], [place] [address], [place], [place] [address]   No results found.  PMH:   Past Medical History:  Diagnosis Date  . Anxiety   . Cirrhosis ([place])   . COVID-19     PSH:   Past Surgical History:  Procedure Laterality Date  . ANKLE SURGERY Right   . IR PARACENTESIS  01/07/2021  . IR PARACENTESIS  01/30/2021  . IR TRANSCATHETER BX  01/09/2021  . IR US GUIDE VASC ACCESS RIGHT  01/09/2021  . IR VENOGRAM HEPATIC W HEMODYNAMIC EVALUATION  01/09/2021    Allergies:  Allergies  Allergen Reactions  . Hydrocodone Itching and Other (See Comments)    Noted with hydrocodone cough syrup. Itching only, no hives.  02/03/16: denied itching with recent hydrocodone cough syrup. Only one episode of itching previously.     Medications:   Prior to Admission medications   Medication  Sig Start Date End Date Taking? Authorizing Provider  albuterol (VENTOLIN HFA) 108 (90 Base) MCG/ACT inhaler Inhale 2 puffs into the lungs every 6 (six) hours as needed for wheezing or shortness of breath. 04/03/21  Yes Maximiano Coss, NP  azelastine (ASTELIN) 0.1 % nasal spray Place 1 spray into both nostrils daily. Use in each nostril as directed Patient taking differently: Place 1 spray into both nostrils daily as needed for allergies. Use in each nostril as directed 03/19/21  Yes Maximiano Coss, NP  dicyclomine (BENTYL) 10 MG capsule Take 1 capsule (10 mg total) by mouth 4 (four) times daily -  before meals and at bedtime. 12/16/20  Yes [place], MD  folic acid (FOLVITE) 1 MG tablet Take 1 tablet (1 mg total) by mouth daily. 03/26/21  Yes Maximiano Coss, NP  furosemide (LASIX) 40 MG tablet Take 1 tablet (40 mg total) by mouth daily. 03/01/21 05/30/21 Yes [place] Eric J, DO  lactulose (CHRONULAC) 10 GM/15ML solution Take 45 mLs (30 g total) by mouth 3 (three) times daily. 03/01/21 05/30/21 Yes [place] Eric J, DO  meclizine (ANTIVERT) 12.5 MG tablet Take 1 tablet (12.5 mg total) by mouth daily as needed for dizziness. 03/19/21  Yes Maximiano Coss, NP  rifaximin (XIFAXAN) 550 MG TABS tablet Take 1 tablet (550 mg total) by mouth 2 (two) times daily. 03/01/21 05/30/21 Yes [place] Donnamarie Poag, DO  spironolactone (ALDACTONE) 100 MG tablet Take 1 tablet (100 mg total) by mouth daily. 03/01/21 05/30/21 Yes [place] Eric J, DO  nadolol (CORGARD) 20 MG tablet Take 1 tablet (20 mg total) by mouth daily. Patient not taking: Reported on 04/20/2021 03/10/21   Maximiano Coss, NP  pantoprazole (PROTONIX) 40 MG tablet Take 1 tablet (40 mg total) by mouth 2 (two) times daily. Patient not taking: Reported on 04/20/2021 04/16/21 05/16/21  Maximiano Coss, NP    Discontinued Meds:   Medications Discontinued During This Encounter  Medication Reason  . furosemide (LASIX) tablet 40 mg   . folic acid (FOLVITE) tablet 1 mg Duplicate   . 0.9 %  sodium chloride infusion     Social History:  reports that he quit smoking about 18 years ago. His smoking use included cigarettes. He has never used smokeless tobacco. He reports previous alcohol use. He reports that he does not use drugs.  Family History:   Family History  Problem Relation Age of Onset  . Diabetes Mother   . Memory loss Mother   . Diabetes Father   . Diabetes Cousin   . Colon cancer Neg Hx     Blood pressure 106/65, pulse 71, temperature 97.6 F (36.4 C), temperature source Oral, resp. rate 16, SpO2 100 %. General appearance: alert, cooperative and appears stated  age Head: Normocephalic, without obvious abnormality, atraumatic Eyes: pallor Neck: no adenopathy, no carotid bruit, supple, symmetrical, trachea midline and thyroid not enlarged, symmetric, no tenderness/mass/nodules Back: symmetric, no curvature. ROM normal. No CVA tenderness. Resp: clear to auscultation bilaterally Cardio: RRR GI: distended Extremities: edema tr Pulses: 2+ and symmetric       Lathyn Griggs, Hunt Oris, MD 04/21/2021, 10:53 AM

## 2021-04-22 ENCOUNTER — Telehealth: Payer: Self-pay

## 2021-04-22 DIAGNOSIS — K7290 Hepatic failure, unspecified without coma: Secondary | ICD-10-CM

## 2021-04-22 DIAGNOSIS — K7460 Unspecified cirrhosis of liver: Secondary | ICD-10-CM

## 2021-04-22 LAB — CBC WITH DIFFERENTIAL/PLATELET
Abs Immature Granulocytes: 0.03 10*3/uL (ref 0.00–0.07)
Basophils Absolute: 0 10*3/uL (ref 0.0–0.1)
Basophils Relative: 1 %
Eosinophils Absolute: 0.1 10*3/uL (ref 0.0–0.5)
Eosinophils Relative: 3 %
HCT: 33.8 % — ABNORMAL LOW (ref 39.0–52.0)
Hemoglobin: 11.8 g/dL — ABNORMAL LOW (ref 13.0–17.0)
Immature Granulocytes: 1 %
Lymphocytes Relative: 28 %
Lymphs Abs: 1.4 10*3/uL (ref 0.7–4.0)
MCH: 33.2 pg (ref 26.0–34.0)
MCHC: 34.9 g/dL (ref 30.0–36.0)
MCV: 95.2 fL (ref 80.0–100.0)
Monocytes Absolute: 0.6 10*3/uL (ref 0.1–1.0)
Monocytes Relative: 13 %
Neutro Abs: 2.7 10*3/uL (ref 1.7–7.7)
Neutrophils Relative %: 54 %
Platelets: 59 10*3/uL — ABNORMAL LOW (ref 150–400)
RBC: 3.55 MIL/uL — ABNORMAL LOW (ref 4.22–5.81)
RDW: 14.8 % (ref 11.5–15.5)
WBC: 4.9 10*3/uL (ref 4.0–10.5)
nRBC: 0 % (ref 0.0–0.2)

## 2021-04-22 LAB — COMPREHENSIVE METABOLIC PANEL
ALT: 48 U/L — ABNORMAL HIGH (ref 0–44)
AST: 163 U/L — ABNORMAL HIGH (ref 15–41)
Albumin: 2.6 g/dL — ABNORMAL LOW (ref 3.5–5.0)
Alkaline Phosphatase: 317 U/L — ABNORMAL HIGH (ref 38–126)
Anion gap: 6 (ref 5–15)
BUN: 13 mg/dL (ref 6–20)
CO2: 22 mmol/L (ref 22–32)
Calcium: 8.9 mg/dL (ref 8.9–10.3)
Chloride: 97 mmol/L — ABNORMAL LOW (ref 98–111)
Creatinine, Ser: 0.82 mg/dL (ref 0.61–1.24)
GFR, Estimated: >60 mL/min (ref >60)
Glucose, Bld: 91 mg/dL (ref 70–99)
Potassium: 5.1 mmol/L (ref 3.5–5.1)
Sodium: 125 mmol/L — ABNORMAL LOW (ref 135–145)
Total Bilirubin: 7.6 mg/dL — ABNORMAL HIGH (ref 0.3–1.2)
Total Protein: 5.8 g/dL — ABNORMAL LOW (ref 6.5–8.1)

## 2021-04-22 LAB — MAGNESIUM: Magnesium: 1.7 mg/dL (ref 1.7–2.4)

## 2021-04-22 NOTE — Progress Notes (Signed)
Patient ID: Ian Hall, male   DOB: 1966/11/20, 55 y.o.   MRN: [id number]  PROGRESS NOTE    CERRONE DEBOLD  HYQ:[id number] DOB: 11-22-1966 DOA: 04/20/2021 PCP: Maximiano Coss, NP   Brief Narrative:  55 year old male with history of alcoholic cirrhosis, recent COVID-19 infection, anxiety disorder, history of DVT not on anticoagulation because of risks of bleeding, chronic hyponatremia presented to GI office with weakness and was found to have sodium of 118.  He was sent to ED for subsequent management.  On presentation, sodium was 118, creatinine of 1.04, total bilirubin of 7.8, alkaline phosphatase 327, ALT of 148 and AST of 44.  He was started on gentle hydration which were subsequently discontinued.  Nephrology and GI were consulted.  Assessment & Plan:   Acute on chronic hyponatremia -Serum sodium in the 129-132 and 02/2021 -On Lasix and spironolactone at home.  Presented with sodium of 118.  No signs of volume overload at the time of presentation.  Was treated with gentle hydration with normal saline which was stopped on 04/21/2021.   -Sodium 125 this morning.  Still no signs of fluid overload.   -Nephrology following: Spironolactone has been resumed.  Lasix on hold.  Follow further nephrology recommendations.  Repeat a.m. sodium.  Decompensated alcoholic cirrhosis of liver Elevated LFTs with hyperbilirubinemia as well Chronic thrombocytopenia History of alcohol abuse History of hepatic encephalopathy -Patient has been abstinent from alcohol for more than 5 months now. -Follows up with GI as an outpatient. -Continue rifaximin/lactulose. -Continue with thiamine, multivitamin and folic acid. -GI following. -Trend LFTs.    Anemia of chronic disease -Probably from above.  Hemoglobin stable.  History of DVT -Not on any treatment because of thrombocytopenia and increased risks of bleeding.  Hypomagnesemia -Improved  Obesity -Outpatient follow-up  DVT prophylaxis: SCDs Code  Status: Full Family Communication: None at bedside Disposition Plan: Status is: Inpatient  Remains inpatient appropriate because:Inpatient level of care appropriate due to severity of illness   Dispo: The patient is from: Home              Anticipated d/c is to: Home in 1 to 2 days once sodium improves and cleared by nephrology/GI              Patient currently is not medically stable to d/c.   Difficult to place patient No  Consultants: GI/nephrology  Procedures: None  Antimicrobials: None    Subjective: Patient seen and examined at bedside.  Denies worsening shortness of breath, abdominal pain, fever, vomiting.  Weakness is improving.   Objective: Vitals:   04/21/21 1248 04/21/21 2047 04/22/21 0500 04/22/21 0510  BP: 120/63 113/70  121/67  Pulse: 67 75  74  Resp: 16 19  20   Temp: 98.2 F (36.8 C) 98.6 F (37 C)  98 F (36.7 C)  TempSrc: Oral Oral  Oral  SpO2: 100% 100%  100%  Weight:   99.2 kg     Intake/Output Summary (Last 24 hours) at 04/22/2021 0739 Last data filed at 04/22/2021 0600 Gross per 24 hour  Intake 1096 ml  Output --  Net 1096 ml   Filed Weights   04/22/21 0500  Weight: 99.2 kg    Examination:  General exam: On room air currently.  No acute distress.  Looks chronically ill and deconditioned.   Respiratory system: Decreased breath sounds at bases bilaterally with some scattered crackles Cardiovascular system: Rate controlled, S1-S2 heard Gastrointestinal system: Abdomen is mildly distended, soft and nontender.  Bowel sounds  are heard  extremities: Mild lower extremity edema present; no clubbing Central nervous system: Awake and alert.  No focal neurological deficits.  Moves extremities  skin: No obvious ecchymosis/lesions Psychiatry: Affect is flat    Data Reviewed: I have personally reviewed following labs and imaging studies  CBC: Recent Labs  Lab 04/17/21 1336 04/17/21 1453 04/20/21 1610 04/20/21 2215 04/21/21 0502 04/22/21 0517   WBC 6.2  --  6.4 5.8 4.8 4.9  NEUTROABS 4.1  --  3.9  --   --  2.7  HGB 12.4* 11.9* 12.0* 11.6* 11.6* 11.8*  HCT 34.9* 35.0* 33.7* 32.8* 32.8* 33.8*  MCV 93.8  --  93.6 94.0 94.8 95.2  PLT 61*  --  62* 56* 55* 59*   Basic Metabolic Panel: Recent Labs  Lab 04/20/21 1000 04/20/21 1610 04/20/21 2215 04/21/21 0502 04/22/21 0517  NA 118* 119* 121* 122* 125*  K 4.8 5.1 4.7 4.9 5.1  CL 91* 92* 94* 95* 97*  CO2 19 21* 22 22 22   GLUCOSE 145* 108* 108* 86 91  BUN 15 16 17 14 13   CREATININE 1.04 0.98 0.86 0.82 0.82  CALCIUM 8.2* 8.4* 8.2* 8.3* 8.9  MG  --   --  1.6*  --  1.7  PHOS  --   --  3.2  --   --    GFR: Estimated Creatinine Clearance: 115.5 mL/min (by C-G formula based on SCr of 0.82 mg/dL). Liver Function Tests: Recent Labs  Lab 04/17/21 1336 04/20/21 1610 04/20/21 2215 04/21/21 0502 04/22/21 0517  AST 152* 148* 142* 149* 163*  ALT 48* 44 39 42 48*  ALKPHOS 347* 327* 320* 331* 317*  BILITOT 8.1* 7.8* 7.4* 7.0* 7.6*  PROT 6.1* 6.2* 5.9* 5.7* 5.8*  ALBUMIN 2.7* 2.7* 2.5* 2.5* 2.6*   Recent Labs  Lab 04/20/21 1610  LIPASE 63*   No results for input(s): AMMONIA in the last 168 hours. Coagulation Profile: Recent Labs  Lab 04/21/21 1300  INR 1.5*   Cardiac Enzymes: No results for input(s): CKTOTAL, CKMB, CKMBINDEX, TROPONINI in the last 168 hours. BNP (last 3 results) No results for input(s): PROBNP in the last 8760 hours. HbA1C: No results for input(s): HGBA1C in the last 72 hours. CBG: No results for input(s): GLUCAP in the last 168 hours. Lipid Profile: No results for input(s): CHOL, HDL, LDLCALC, TRIG, CHOLHDL, LDLDIRECT in the last 72 hours. Thyroid Function Tests: No results for input(s): TSH, T4TOTAL, FREET4, T3FREE, THYROIDAB in the last 72 hours. Anemia Panel: No results for input(s): VITAMINB12, FOLATE, FERRITIN, TIBC, IRON, RETICCTPCT in the last 72 hours. Sepsis Labs: No results for input(s): PROCALCITON, LATICACIDVEN in the last 168  hours.  Recent Results (from the past 240 hour(s))  SARS CORONAVIRUS 2 (TAT 6-24 HRS) Nasopharyngeal Nasopharyngeal Swab     Status: None   Collection Time: 04/20/21  6:32 PM   Specimen: Nasopharyngeal Swab  Result Value Ref Range Status   SARS Coronavirus 2 NEGATIVE NEGATIVE Final    Comment: (NOTE) SARS-CoV-2 target nucleic acids are NOT DETECTED.  The SARS-CoV-2 RNA is generally detectable in upper and lower respiratory specimens during the acute phase of infection. Negative results do not preclude SARS-CoV-2 infection, do not rule out co-infections with other pathogens, and should not be used as the sole basis for treatment or other patient management decisions. Negative results must be combined with clinical observations, patient history, and epidemiological information. The expected result is Negative.  Fact Sheet for Patients: SugarRoll.be  Fact Sheet for Healthcare  Providers: https://www.woods-mathews.com/  This test is not yet approved or cleared by the [place] and  has been authorized for detection and/or diagnosis of SARS-CoV-2 by FDA under an Emergency Use Authorization (EUA). This EUA will remain  in effect (meaning this test can be used) for the duration of the COVID-19 declaration under Se ction 564(b)(1) of the Act, 21 U.S.C. section 360bbb-3(b)(1), unless the authorization is terminated or revoked sooner.  Performed at [facility], [place] [address], [place], [place] [address]          Radiology Studies: No results found.      Scheduled Meds: . dicyclomine  10 mg Oral TID AC & HS  . folic acid  1 mg Oral Daily  . lactulose  30 g Oral TID  . multivitamin with minerals  1 tablet Oral Daily  . rifaximin  550 mg Oral BID  . spironolactone  100 mg Oral Daily  . thiamine  100 mg Oral Daily   Or  . thiamine  100 mg Intravenous Daily   Continuous Infusions:        Aline August,  MD [facility] 04/22/2021, 7:39 AM

## 2021-04-22 NOTE — TOC Transition Note (Signed)
Transition of Care [facility]) - CM/SW Discharge Note   Patient Details  Name: Ian Hall MRN: [id number] Date of Birth: 1966/01/18  [facility] CM/SW Contact:  Trish Mage, LCSW Phone Number: 04/22/2021, 11:02 AM   Clinical Narrative:   Patient seen in follow up to MD consult for alcohol use, which is perplexing as Mr Samons was not tested for alcohol in his system upon admission, and states he has not had a drink since last December when he ended up in the hospital, and now has to wait another 6 months until he is eligible to be put on wait list for liver transplant.  Mr Shepperson cites a good support system, a desire to live without alcohol and has put in place a regimen for keeping himself busy and productive.  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 No further needs identified. TOC will continue to follow during the course of hospitalization.      Final next level of care: Home/Self Care Barriers to Discharge: No Barriers Identified   Patient Goals and CMS Choice        Discharge Placement                       Discharge Plan and Services                                     Social Determinants of Health (SDOH) Interventions     Readmission Risk Interventions No flowsheet data found.

## 2021-04-22 NOTE — Progress Notes (Addendum)
[facility] Progress Note  CC:  Hyponatremia   Subjective: He continues to feel well.  No nausea or vomiting.  No abdominal pain.  No chest pain or shortness of breath.  He passed a normal brown bowel movement this morning.  Objective:  Vital signs in last 24 hours: Temp:  [98 F (36.7 C)-98.6 F (37 C)] 98 F (36.7 C) (05/25 0510) Pulse Rate:  [67-75] 74 (05/25 0510) Resp:  [16-20] 20 (05/25 0510) BP: (113-121)/(63-70) 121/67 (05/25 0510) SpO2:  [100 %] 100 % (05/25 0510) Weight:  [99.2 kg] 99.2 kg (05/25 0500) Last BM Date: 04/21/21   General:   Alert 55 year old male in no acute distress. Eyes: Scleral icterus present. Heart: Regular rate and rhythm, no murmurs. Pulm: Breath sounds clear throughout. Abdomen: Soft, mildly protuberant without ascites.  Abdomen is not tense.  Nontender.  Positive bowel sounds all 4 quadrants.  No hepatomegaly.  Extremities:  No edema. Neurologic:  Alert and  oriented x 4. Grossly normal neurologically.  No asterixis. Psych:  Alert and cooperative. Normal mood and affect.  Lab Results: Recent Labs    04/20/21 2215 04/21/21 0502 04/22/21 0517  WBC 5.8 4.8 4.9  HGB 11.6* 11.6* 11.8*  HCT 32.8* 32.8* 33.8*  PLT 56* 55* 59*   BMET Recent Labs    04/20/21 2215 04/21/21 0502 04/22/21 0517  NA 121* 122* 125*  K 4.7 4.9 5.1  CL 94* 95* 97*  CO2 _0 GLUCOSE 108* 86 91  BUN _1 CREATININE 0.86 0.82 0.82  CALCIUM 8.2* 8.3* 8.9   LFT Recent Labs    04/22/21 0517  PROT 5.8*  ALBUMIN 2.6*  AST 163*  ALT 48*  ALKPHOS 317*  BILITOT 7.6*   PT/INR Recent Labs    04/21/21 1300  LABPROT 17.6*  INR 1.5*   Hepatitis Panel No results for input(s): HEPBSAG, HCVAB, HEPAIGM, HEPBIGM in the last 72 hours.  No results found.  Assessment / Plan:  12. 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 He was admitted to  the hospital 04/20/2021 due to worsening hyponatremia, likely due to diuretics.  He does not clinically appear fluid overloaded.  Na+ 118 -> 119 -> 121 -> 122 -> Today Na+ 125.  Furosemide discontinued at time of admission. Remains on Spironolactone 151m QD. Alk Phos 317. T. Bili 7.0 -> 7.6. AST 149 -> 163. ALT 42 -> 48. MELD Na+ 27 based on labs 04/22/2021. Remains on Lactulose and Xifaxan. No overt signs of hepatic encephalopathy.  -Continue to hold Furosemide, may be able to restart low dose Furosemide in a few days if Na+ level continue to improve, appreciate nephrology recommendations -Continue Spironolactone 1061mQD -BMP in am -Continue Lactulose 30gm po tid and Xifaxan 55013mo bid  -Continue Folic Acid, MVI and Thiamine  -Patient to follow up with [facility] June 2022 -Further recommendations per Dr. JacArdis Hughs2. Chronic normocytic anemia. Hg 11.8. No overt GI bleeding.   3. Thrombocytopenia secondary to cirrhosis and splenomegaly  4. GERD - Continue Pantoprazole 7m52m QD  5.  Colon cancer screening. Poor prep with colonoscopy 12/2020.  -Eventual outpatient colonoscopy  6. SOB with exertion. Negative chest CTA 04/09/2021 -Outpatient cardiac evaluation recommended   7. History of DVT not on anticoagulation     LOS: 2 days   CollNoralyn Pick25/2022, 9:09 AM   ________________________________________________________________________  [facility] MD note:  I personally examined the patient, reviewed the data and agree with the assessment and plan described above.  His sodium has been coming up very nicely by holding his lasix (was on 54m daily).  HE's continued the aldactone at his usual 1028mdaily dosing.  I  recommend restarting lasix (today or tomorrow) at 1/2 of his previous dose, so 2050maily.  Will plan to follow up with BMET in 1 week at our office. We will coordinate that lab draw.  Please call or page with any further questions or  concerns.   DanOwens LofflerD [facility] Pager [phone]

## 2021-04-22 NOTE — Telephone Encounter (Signed)
Faxed the form to [facility].

## 2021-04-22 NOTE — Progress Notes (Signed)
[facility] Progress Note   Assessment/Plan: 1. Hyponatremia : acute on chronic with SNa in the 129-132 range in 02/2021  Was as low as 118 this admission.  He appears euvolemic currently but the drop may be associated with prerenal azotemia superimposed on liver cirrhosis and this is supported by the improvement withholding diuretics.  No NSAIDs or SSRI's. - Will check a TSH, cortisol to complete the workup.   Ordered for tomorrow AM.  - Agree with holding Lasix for now; ok with continuing Spironolactone as long as SNa is uptrending. Likely will resume lasix tomorrow.  -Strict I/Os ordered but not documented; reviewed with patient importance - No indication for 3% or tolvaptan especially in a known cirrhotic.   2. Cirrhosis, alcoholic: abstinent for a decade then drank for New Years 11/28/20.  Being eval for OLT outpt but is waiting the 6 months since last drink.  Has required LVP x 2 this year he reports, last was several months ago.  3. H/o COVID : certainly possible that the pulmonary process could be contributing to the hyponatremia in the past  but currently no pulmonary symptoms.  I would prefer that he stay overnight to ensure continued improvement, but if he insists to leave his serum sodium should be repeated in a few days.  I would D/C on previous dose of lasix.  His gastroenterologist can continue to follow.    Subjective:   Feeling well, would really like to d/c today.   Objective Vitals:   04/21/21 1248 04/21/21 2047 04/22/21 0500 04/22/21 0510  BP: 120/63 113/70  121/67  Pulse: 67 75  74  Resp: 16 19  20   Temp: 98.2 F (36.8 C) 98.6 F (37 C)  98 F (36.7 C)  TempSrc: Oral Oral  Oral  SpO2: 100% 100%  100%  Weight:   99.2 kg   I/Os not documented.  Physical Exam  General: standing at sink grooming, comfortable Heart: RRR Lungs: clear to bases with normal WOB on RA Abdomen: soft, obese Extremities: no edema Neuro: nonfocal  Additional  Objective Labs: Basic Metabolic Panel: Recent Labs  Lab 04/20/21 2215 04/21/21 0502 04/22/21 0517  NA 121* 122* 125*  K 4.7 4.9 5.1  CL 94* 95* 97*  CO2 22 22 22   GLUCOSE 108* 86 91  BUN 17 14 13   CREATININE 0.86 0.82 0.82  CALCIUM 8.2* 8.3* 8.9  PHOS 3.2  --   --    Liver Function Tests: Recent Labs  Lab 04/20/21 2215 04/21/21 0502 04/22/21 0517  AST 142* 149* 163*  ALT 39 42 48*  ALKPHOS 320* 331* 317*  BILITOT 7.4* 7.0* 7.6*  PROT 5.9* 5.7* 5.8*  ALBUMIN 2.5* 2.5* 2.6*   Recent Labs  Lab 04/20/21 1610  LIPASE 63*   CBC: Recent Labs  Lab 04/17/21 1336 04/17/21 1453 04/20/21 1610 04/20/21 2215 04/21/21 0502 04/22/21 0517  WBC 6.2  --  6.4 5.8 4.8 4.9  NEUTROABS 4.1  --  3.9  --   --  2.7  HGB 12.4*   < > 12.0* 11.6* 11.6* 11.8*  HCT 34.9*   < > 33.7* 32.8* 32.8* 33.8*  MCV 93.8  --  93.6 94.0 94.8 95.2  PLT 61*  --  62* 56* 55* 59*   < > = values in this interval not displayed.   Blood Culture    Component Value Date/Time   SDES  02/24/2021 0550    BLOOD RIGHT ANTECUBITAL Performed at [facility], [address] [place], [place] [address]    SPECREQUEST  02/24/2021 0550    BOTTLES DRAWN AEROBIC AND ANAEROBIC Blood Culture adequate volume Performed at [place] [address] [place], [place] [address]    CULT  02/24/2021 0550    NO GROWTH 5 DAYS Performed at [facility] [address] [place],  [address]    REPTSTATUS 03/01/2021 FINAL 02/24/2021 0550    Cardiac Enzymes: No results for input(s): CKTOTAL, CKMB, CKMBINDEX, TROPONINI in the last 168 hours. CBG: No results for input(s): GLUCAP in the last 168 hours. Iron Studies: No results for input(s): IRON, TIBC, TRANSFERRIN, FERRITIN in the last 72 hours. @lablastinr3 @ Studies/Results: No results found. Medications:  . dicyclomine  10 mg Oral TID AC & HS  . folic acid  1 mg Oral Daily  . lactulose  30 g Oral TID  .  multivitamin with minerals  1 tablet Oral Daily  . rifaximin  550 mg Oral BID  . spironolactone  100 mg Oral Daily  . thiamine  100 mg Oral Daily   Or  . thiamine  100 mg Intravenous Daily       Jannifer Hick MD 04/22/2021, 8:57 AM  [facility] Pager: [phone]

## 2021-04-22 NOTE — Telephone Encounter (Signed)
-----   Message from Milus Banister, MD sent at 04/22/2021 12:38 PM EDT ----- He is leaving hospital today or tomorrow. Needs BMET in 1 week.  Thanks

## 2021-04-23 LAB — CBC WITH DIFFERENTIAL/PLATELET
Abs Immature Granulocytes: 0.05 10*3/uL (ref 0.00–0.07)
Basophils Absolute: 0.1 10*3/uL (ref 0.0–0.1)
Basophils Relative: 1 %
Eosinophils Absolute: 0.1 10*3/uL (ref 0.0–0.5)
Eosinophils Relative: 3 %
HCT: 33.8 % — ABNORMAL LOW (ref 39.0–52.0)
Hemoglobin: 11.9 g/dL — ABNORMAL LOW (ref 13.0–17.0)
Immature Granulocytes: 1 %
Lymphocytes Relative: 28 %
Lymphs Abs: 1.5 10*3/uL (ref 0.7–4.0)
MCH: 33.2 pg (ref 26.0–34.0)
MCHC: 35.2 g/dL (ref 30.0–36.0)
MCV: 94.4 fL (ref 80.0–100.0)
Monocytes Absolute: 0.6 10*3/uL (ref 0.1–1.0)
Monocytes Relative: 11 %
Neutro Abs: 3 10*3/uL (ref 1.7–7.7)
Neutrophils Relative %: 56 %
Platelets: 65 10*3/uL — ABNORMAL LOW (ref 150–400)
RBC: 3.58 MIL/uL — ABNORMAL LOW (ref 4.22–5.81)
RDW: 14.8 % (ref 11.5–15.5)
WBC: 5.3 10*3/uL (ref 4.0–10.5)
nRBC: 0 % (ref 0.0–0.2)

## 2021-04-23 LAB — CORTISOL-AM, BLOOD: Cortisol - AM: 7.4 ug/dL (ref 6.7–22.6)

## 2021-04-23 LAB — RENAL FUNCTION PANEL
Albumin: 2.6 g/dL — ABNORMAL LOW (ref 3.5–5.0)
Anion gap: 7 (ref 5–15)
BUN: 15 mg/dL (ref 6–20)
CO2: 21 mmol/L — ABNORMAL LOW (ref 22–32)
Calcium: 9.1 mg/dL (ref 8.9–10.3)
Chloride: 98 mmol/L (ref 98–111)
Creatinine, Ser: 0.63 mg/dL (ref 0.61–1.24)
GFR, Estimated: >60 mL/min (ref >60)
Glucose, Bld: 92 mg/dL (ref 70–99)
Phosphorus: 3.9 mg/dL (ref 2.5–4.6)
Potassium: 5 mmol/L (ref 3.5–5.1)
Sodium: 126 mmol/L — ABNORMAL LOW (ref 135–145)

## 2021-04-23 LAB — MAGNESIUM: Magnesium: 1.7 mg/dL (ref 1.7–2.4)

## 2021-04-23 LAB — TSH: TSH: 2.534 u[IU]/mL (ref 0.350–4.500)

## 2021-04-23 MED ORDER — FUROSEMIDE 20 MG PO TABS: 20.0000 mg | ORAL_TABLET | Freq: Every day | ORAL | 0 refills | Status: DC

## 2021-04-23 NOTE — Plan of Care (Signed)

## 2021-04-23 NOTE — Progress Notes (Signed)
[facility] Progress Note   Assessment/Plan: 1. Hyponatremia : acute on chronic with SNa in the 129-132 range in 02/2021  Was as low as 118 this admission.  He appears euvolemic currently but the drop may be associated with prerenal azotemia superimposed on liver cirrhosis and this is supported by the improvement withholding diuretics.  No NSAIDs or SSRI's. - TSH (2.5), cortisol (7.4)  - From renal standpoint ok for d/c as Na increasing trend; likely has reset osmostat so he will run on the lower side. May continue Spironolactone and d/c on Lasix 65m daily in the AM.  2. Cirrhosis, alcoholic: abstinent for a decade then drank for New Years 11/28/20.  Being eval for OLT outpt but is waiting the 6 months since last drink.  Has required LVP x 2 this year he reports, last was several months ago.  3. H/o COVID : certainly possible that the pulmonary process could be contributing to the hyponatremia in the past  but currently no pulmonary symptoms.   Subjective:   Feeling well w/ no complaints; no leg swelling, ambulating comfortably and denies dyspnea or dizziness.  Objective Vitals:   04/22/21 1337 04/22/21 2135 04/23/21 0500 04/23/21 0521  BP: 125/71 123/72  (!) 107/59  Pulse: 71 74  72  Resp: 16 19  18   Temp: 98.4 F (36.9 C) 98.4 F (36.9 C)  98.2 F (36.8 C)  TempSrc: Oral Oral  Oral  SpO2: 100% 100%  100%  Weight:   99 kg   I/Os not documented.  Physical Exam  General: standing at sink grooming, comfortable Heart: RRR Lungs: clear to bases with normal WOB on RA Abdomen: soft, obese Extremities: no edema Neuro: nonfocal  Additional Objective Labs: Basic Metabolic Panel: Recent Labs  Lab 04/20/21 2215 04/21/21 0502 04/22/21 0517 04/23/21 0450  NA 121* 122* 125* 126*  K 4.7 4.9 5.1 5.0  CL 94* 95* 97* 98  CO2 22 22 22  21*  GLUCOSE 108* 86 91 92  BUN 17 14 13 15   CREATININE 0.86 0.82 0.82 0.63  CALCIUM 8.2* 8.3* 8.9 9.1  PHOS 3.2  --   --  3.9    Liver Function Tests: Recent Labs  Lab 04/20/21 2215 04/21/21 0502 04/22/21 0517 04/23/21 0450  AST 142* 149* 163*  --   ALT 39 42 48*  --   ALKPHOS 320* 331* 317*  --   BILITOT 7.4* 7.0* 7.6*  --   PROT 5.9* 5.7* 5.8*  --   ALBUMIN 2.5* 2.5* 2.6* 2.6*   Recent Labs  Lab 04/20/21 1610  LIPASE 63*   CBC: Recent Labs  Lab 04/20/21 1610 04/20/21 2215 04/21/21 0502 04/22/21 0517 04/23/21 0450  WBC 6.4 5.8 4.8 4.9 5.3  NEUTROABS 3.9  --   --  2.7 3.0  HGB 12.0* 11.6* 11.6* 11.8* 11.9*  HCT 33.7* 32.8* 32.8* 33.8* 33.8*  MCV 93.6 94.0 94.8 95.2 94.4  PLT 62* 56* 55* 59* 65*   Blood Culture    Component Value Date/Time   SDES  02/24/2021 0550    BLOOD RIGHT ANTECUBITAL Performed at [facility] [address] [place] [id number]   SPECREQUEST  02/24/2021 0550    BOTTLES DRAWN AEROBIC AND ANAEROBIC Blood Culture adequate volume Performed at [facility] [place] [id number]   CULT  02/24/2021 0550    NO GROWTH 5 DAYS Performed at [facility], [place]  [address]    REPTSTATUS 03/01/2021 FINAL 02/24/2021 0550    Cardiac Enzymes: No results for input(s): CKTOTAL, CKMB, CKMBINDEX, TROPONINI in the last 168 hours. CBG: No results for input(s): GLUCAP in the last 168 hours. Iron Studies: No results for input(s): IRON, TIBC, TRANSFERRIN, FERRITIN in the last 72 hours. @lablastinr3 @ Studies/Results: No results found. Medications:  . dicyclomine  10 mg Oral TID AC & HS  . folic acid  1 mg Oral Daily  . lactulose  30 g Oral TID  . multivitamin with minerals  1 tablet Oral Daily  . rifaximin  550 mg Oral BID  . spironolactone  100 mg Oral Daily  . thiamine  100 mg Oral Daily   Or  . thiamine  100 mg Intravenous Daily       Jannifer Hick MD 04/23/2021, 10:43 AM  [facility] Pager: [phone]

## 2021-04-23 NOTE — Discharge Instructions (Signed)
Hyponatremia Hyponatremia is when the amount of salt (sodium) in your blood is too low. When salt levels are low, your body may take in extra water. This can cause swelling throughout the body. The swelling often affects the brain. What are the causes? This condition may be caused by:  Certain medical problems or conditions.  Vomiting a lot.  Having watery poop (diarrhea) often.  Certain medicines or illegal drugs.  Not having enough water in the body (dehydration).  Drinking too much water.  Eating a diet that is low in salt.  Large burns on your body.  Too much sweating. What increases the risk? You are more likely to get this condition if you:  Have long-term (chronic) kidney disease.  Have heart failure.  Have a medical condition that causes you to have watery poop often.  Do very hard exercises.  Take medicines that affect the amount of salt is in your blood. What are the signs or symptoms? Symptoms of this condition include:  Headache.  Feeling like you may vomit (nausea).  Vomiting.  Being very tired (lethargic).  Muscle weakness and cramps.  Not wanting to eat as much as normal (loss of appetite).  Feeling weak or light-headed. Severe symptoms of this condition include:  Confusion.  Feeling restless (agitation).  Having a fast heart rate.  Passing out (fainting).  Seizures.  Coma. How is this treated? Treatment for this condition depends on the cause. Treatment may include:  Getting fluids through an IV tube that is put into one of your veins.  Taking medicines to fix the salt levels in your blood. If medicines are causing the problem, your medicines will need to be changed.  Limiting how much water or fluid you take in.  Monitoring in the hospital to watch your symptoms.   Follow these instructions at home:  Take over-the-counter and prescription medicines only as told by your doctor. Many medicines can make this condition worse.  Talk with your doctor about any medicines that you are taking.  Eat and drink exactly as you are told by your doctor. ? Eat only the foods you are told to eat. ? Limit how much fluid you take.  Do not drink alcohol.  Keep all follow-up visits as told by your doctor. This is important.   Contact a doctor if:  You feel more like you may vomit.  You feel more tired.  Your headache gets worse.  You feel more confused.  You feel weaker.  Your symptoms go away and then they come back.  You have trouble following the diet instructions. Get help right away if:  You have a seizure.  You pass out.  You keep having watery poop.  You keep vomiting. Summary  Hyponatremia is when the amount of salt in your blood is too low.  When salt levels are low, you can have swelling throughout the body. The swelling mostly affects the brain.  Treatment depends on the cause. Treatment may include getting IV fluids, medicines, or not drinking as much fluid. This information is not intended to replace advice given to you by your health care provider. Make sure you discuss any questions you have with your health care provider. Document Revised: 02/01/2019 Document Reviewed: 10/19/2018 Elsevier Patient Education  2021 [employer].

## 2021-04-23 NOTE — Discharge Summary (Signed)
Physician Discharge Summary  Ian Hall BHA:[id number] DOB: 09-Dec-1965 DOA: 04/20/2021  PCP: Maximiano Coss, NP  Admit date: 04/20/2021 Discharge date: 04/23/2021 30 Day Unplanned Readmission Risk Score   Flowsheet Row ED to Hosp-Admission (Current) from 04/20/2021 in [place] [facility]  30 Day Unplanned Readmission Risk Score (%) 28.93 Filed at 04/23/2021 0801     This score is the patient's risk of an unplanned readmission within 30 days of being discharged (0 -100%). The score is based on dignosis, age, lab data, medications, orders, and past utilization.   Low:  0-14.9   Medium: 15-21.9   High: 22-29.9   Extreme: 30 and above         Admitted From: Home Disposition: Home  Recommendations for Outpatient Follow-up:  1. Follow up with PCP in 1-2 weeks 2. Follow-up with GI in 1 week 3. Please obtain BMP/CBC in one week 4. Please follow up with your PCP on the following pending results: Unresulted Labs (From admission, onward)         None        Home Health: None Equipment/Devices: None  Discharge Condition: Stable CODE STATUS: Full code Diet recommendation: Cardiac  Subjective: Seen and examined.  No complaints.  Very eager to go home.  Brief/Interim Summary: 55 year old male with history of alcoholic cirrhosis, recent COVID-19 infection, anxiety disorder, history of DVT not on anticoagulation because of risks of bleeding, chronic hyponatremia presented to GI office with weakness and was found to have sodium of 118.  He was sent to ED for subsequent management.  On presentation, sodium was 118, creatinine of 1.04, total bilirubin of 7.8, alkaline phosphatase 327, ALT of 148 and AST of 44.  He was started on gentle hydration which were subsequently discontinued.  Nephrology and GI were consulted.  Initially his Aldactone and Lasix were stopped.  His sodium started to improve.  He was started on Aldactone by nephrology.  Sodium is 126 today.   He has been cleared by nephrology as well as GI for discharge.  Patient is also having no symptoms and is very eager to go home.  Nephrology and GI has recommended discharging him on half dose of Lasix so that will be 20 mg as he was taking 40 mg previously and to discharge on Aldactone and rest of his home medications.  Hypomagnesemia also was replenished.  He has a BMP repeat ordered by GI in 1 week and GI will follow up with him as outpatient in 1 week as well.  Discharge Diagnoses:  Principal Problem:   Hyponatremia Active Problems:   Hepatitis, alcoholic, acute   Alcohol abuse, episodic   Hyperbilirubinemia   Ascites due to alcoholic cirrhosis (HCC)   Acute deep vein thrombosis (DVT) of right tibial vein (HCC)   Alcoholic cirrhosis of liver without ascites (HCC)   DVT (deep venous thrombosis) ([place])   Hypomagnesemia    Discharge Instructions   Allergies as of 04/23/2021      Reactions   Hydrocodone Itching, Other (See Comments)   Noted with hydrocodone cough syrup. Itching only, no hives.  02/03/16: denied itching with recent hydrocodone cough syrup. Only one episode of itching previously.       Medication List    STOP taking these medications   nadolol 20 MG tablet Commonly known as: Corgard     TAKE these medications   albuterol 108 (90 Base) MCG/ACT inhaler Commonly known as: VENTOLIN HFA Inhale 2 puffs into the lungs every  6 (six) hours as needed for wheezing or shortness of breath.   azelastine 0.1 % nasal spray Commonly known as: ASTELIN Place 1 spray into both nostrils daily. Use in each nostril as directed What changed:   when to take this  reasons to take this   dicyclomine 10 MG capsule Commonly known as: BENTYL Take 1 capsule (10 mg total) by mouth 4 (four) times daily -  before meals and at bedtime.   folic acid 1 MG tablet Commonly known as: FOLVITE Take 1 tablet (1 mg total) by mouth daily.   furosemide 20 MG tablet Commonly known as:  Lasix Take 1 tablet (20 mg total) by mouth daily. What changed:   medication strength  how much to take   lactulose 10 GM/15ML solution Commonly known as: CHRONULAC Take 45 mLs (30 g total) by mouth 3 (three) times daily.   meclizine 12.5 MG tablet Commonly known as: ANTIVERT Take 1 tablet (12.5 mg total) by mouth daily as needed for dizziness.   pantoprazole 40 MG tablet Commonly known as: PROTONIX Take 1 tablet (40 mg total) by mouth 2 (two) times daily.   rifaximin 550 MG Tabs tablet Commonly known as: XIFAXAN Take 1 tablet (550 mg total) by mouth 2 (two) times daily.   spironolactone 100 MG tablet Commonly known as: Aldactone Take 1 tablet (100 mg total) by mouth daily.       Follow-up Information    Maximiano Coss, NP Follow up.   Specialty: Adult Health Nurse Practitioner Contact information: [address] [place] [address] [phone]        Maximiano Coss, NP Follow up in 1 week(s).   Specialty: Adult Health Nurse Practitioner Contact information: [place] [address] [phone]              Allergies  Allergen Reactions  . Hydrocodone Itching and Other (See Comments)    Noted with hydrocodone cough syrup. Itching only, no hives.  02/03/16: denied itching with recent hydrocodone cough syrup. Only one episode of itching previously.     Consultations: GI and nephrology   Procedures/Studies: CT Angio Chest W/Cm &/Or Wo Cm  Result Date: 04/10/2021 CLINICAL DATA:  Shortness of breath.  History of DVT.  Rule out PE. EXAM: CT ANGIOGRAPHY CHEST WITH CONTRAST TECHNIQUE: Multidetector CT imaging of the chest was performed using the standard protocol during bolus administration of intravenous contrast. Multiplanar CT image reconstructions and MIPs were obtained to evaluate the vascular anatomy. CONTRAST:  59m OMNIPAQUE IOHEXOL 350 MG/ML SOLN COMPARISON:  None. FINDINGS: Cardiovascular: No filling defects in the pulmonary  arteries to suggest pulmonary emboli. Heart is normal size. Aorta is normal caliber. Mediastinum/Nodes: No mediastinal, hilar, or axillary adenopathy. Trachea and esophagus are unremarkable. Thyroid unremarkable. Lungs/Pleura: No confluent airspace opacities or effusions. Upper Abdomen: Calcifications throughout the wall of the gallbladder compatible with porcelain gallbladder. Changes of cirrhosis. Probable splenomegaly. Spleen is not imaged in its entirety. Musculoskeletal: Chest wall soft tissues are unremarkable. No acute bony abnormality. Review of the MIP images confirms the above findings. IMPRESSION: No evidence of pulmonary embolus. No acute cardiopulmonary disease. Porcelain gallbladder. Changes of cirrhosis with splenomegaly. Electronically Signed   By: KRolm BaptiseM.D.   On: 04/10/2021 11:53      Discharge Exam: Vitals:   04/22/21 2135 04/23/21 0521  BP: 123/72 (!) 107/59  Pulse: 74 72  Resp: 19 18  Temp: 98.4 F (36.9 C) 98.2 F (36.8 C)  SpO2: 100% 100%  Vitals:   04/22/21 1337 04/22/21 2135 04/23/21 0500 04/23/21 0521  BP: 125/71 123/72  (!) 107/59  Pulse: 71 74  72  Resp: 16 19  18   Temp: 98.4 F (36.9 C) 98.4 F (36.9 C)  98.2 F (36.8 C)  TempSrc: Oral Oral  Oral  SpO2: 100% 100%  100%  Weight:   99 kg     General: Pt is alert, awake, not in acute distress Cardiovascular: RRR, S1/S2 +, no rubs, no gallops Respiratory: CTA bilaterally, no wheezing, no rhonchi Abdominal: Soft, NT, ND, bowel sounds + Extremities: no edema, no cyanosis    The results of significant diagnostics from this hospitalization (including imaging, microbiology, ancillary and laboratory) are listed below for reference.     Microbiology: Recent Results (from the past 240 hour(s))  SARS CORONAVIRUS 2 (TAT 6-24 HRS) Nasopharyngeal Nasopharyngeal Swab     Status: None   Collection Time: 04/20/21  6:32 PM   Specimen: Nasopharyngeal Swab  Result Value Ref Range Status   SARS Coronavirus  2 NEGATIVE NEGATIVE Final    Comment: (NOTE) SARS-CoV-2 target nucleic acids are NOT DETECTED.  The SARS-CoV-2 RNA is generally detectable in upper and lower respiratory specimens during the acute phase of infection. Negative results do not preclude SARS-CoV-2 infection, do not rule out co-infections with other pathogens, and should not be used as the sole basis for treatment or other patient management decisions. Negative results must be combined with clinical observations, patient history, and epidemiological information. The expected result is Negative.  Fact Sheet for Patients: SugarRoll.be  Fact Sheet for Healthcare Providers: https://www.woods-mathews.com/  This test is not yet approved or cleared by the [place] FDA and  has been authorized for detection and/or diagnosis of SARS-CoV-2 by FDA under an Emergency Use Authorization (EUA). This EUA will remain  in effect (meaning this test can be used) for the duration of the COVID-19 declaration under Se ction 564(b)(1) of the Act, 21 U.S.C. section 360bbb-3(b)(1), unless the authorization is terminated or revoked sooner.  Performed at [facility], [facility] [address] [place], [place] [address]      Labs: BNP (last 3 results) No results for input(s): BNP in the last 8760 hours. Basic Metabolic Panel: Recent Labs  Lab 04/20/21 1610 04/20/21 2215 04/21/21 0502 04/22/21 0517 04/23/21 0450  NA 119* 121* 122* 125* 126*  K 5.1 4.7 4.9 5.1 5.0  CL 92* 94* 95* 97* 98  CO2 21* 22 22 22  21*  GLUCOSE 108* 108* 86 91 92  BUN 16 17 14 13 15   CREATININE 0.98 0.86 0.82 0.82 0.63  CALCIUM 8.4* 8.2* 8.3* 8.9 9.1  MG  --  1.6*  --  1.7 1.7  PHOS  --  3.2  --   --  3.9   Liver Function Tests: Recent Labs  Lab 04/17/21 1336 04/20/21 1610 04/20/21 2215 04/21/21 0502 04/22/21 0517 04/23/21 0450  AST 152* 148* 142* 149* 163*  --   ALT 48* 44 39 42 48*  --   ALKPHOS 347* 327*  320* 331* 317*  --   BILITOT 8.1* 7.8* 7.4* 7.0* 7.6*  --   PROT 6.1* 6.2* 5.9* 5.7* 5.8*  --   ALBUMIN 2.7* 2.7* 2.5* 2.5* 2.6* 2.6*   Recent Labs  Lab 04/20/21 1610  LIPASE 63*   No results for input(s): AMMONIA in the last 168 hours. CBC: Recent Labs  Lab 04/17/21 1336 04/17/21 1453 04/20/21 1610 04/20/21 2215 04/21/21 0502 04/22/21 6045 04/23/21 0450  WBC 6.2  --  6.4 5.8 4.8 4.9 5.3  NEUTROABS 4.1  --  3.9  --   --  2.7 3.0  HGB 12.4*   < > 12.0* 11.6* 11.6* 11.8* 11.9*  HCT 34.9*   < > 33.7* 32.8* 32.8* 33.8* 33.8*  MCV 93.8  --  93.6 94.0 94.8 95.2 94.4  PLT 61*  --  62* 56* 55* 59* 65*   < > = values in this interval not displayed.   Cardiac Enzymes: No results for input(s): CKTOTAL, CKMB, CKMBINDEX, TROPONINI in the last 168 hours. BNP: Invalid input(s): POCBNP CBG: No results for input(s): GLUCAP in the last 168 hours. D-Dimer No results for input(s): DDIMER in the last 72 hours. Hgb A1c No results for input(s): HGBA1C in the last 72 hours. Lipid Profile No results for input(s): CHOL, HDL, LDLCALC, TRIG, CHOLHDL, LDLDIRECT in the last 72 hours. Thyroid function studies Recent Labs    04/23/21 0450  TSH 2.534   Anemia work up No results for input(s): VITAMINB12, FOLATE, FERRITIN, TIBC, IRON, RETICCTPCT in the last 72 hours. Urinalysis    Component Value Date/Time   COLORURINE YELLOW 04/21/2021 0457   APPEARANCEUR CLEAR 04/21/2021 0457   APPEARANCEUR Cloudy (A) 01/14/2021 1630   LABSPEC 1.005 04/21/2021 0457   PHURINE 7.0 04/21/2021 0457   GLUCOSEU NEGATIVE 04/21/2021 0457   GLUCOSEU NEGATIVE 03/10/2021 1238   HGBUR SMALL (A) 04/21/2021 [employer] 04/21/2021 0457   BILIRUBINUR Negative 01/14/2021 [place] 04/21/2021 0457   PROTEINUR NEGATIVE 04/21/2021 0457   UROBILINOGEN 0.2 03/10/2021 1238   NITRITE NEGATIVE 04/21/2021 0457   LEUKOCYTESUR NEGATIVE 04/21/2021 0457   Sepsis Labs Invalid input(s):  PROCALCITONIN,  WBC,  LACTICIDVEN Microbiology Recent Results (from the past 240 hour(s))  SARS CORONAVIRUS 2 (TAT 6-24 HRS) Nasopharyngeal Nasopharyngeal Swab     Status: None   Collection Time: 04/20/21  6:32 PM   Specimen: Nasopharyngeal Swab  Result Value Ref Range Status   SARS Coronavirus 2 NEGATIVE NEGATIVE Final    Comment: (NOTE) SARS-CoV-2 target nucleic acids are NOT DETECTED.  The SARS-CoV-2 RNA is generally detectable in upper and lower respiratory specimens during the acute phase of infection. Negative results do not preclude SARS-CoV-2 infection, do not rule out co-infections with other pathogens, and should not be used as the sole basis for treatment or other patient management decisions. Negative results must be combined with clinical observations, patient history, and epidemiological information. The expected result is Negative.  Fact Sheet for Patients: SugarRoll.be  Fact Sheet for Healthcare Providers: https://www.woods-mathews.com/  This test is not yet approved or cleared by the [place] FDA and  has been authorized for detection and/or diagnosis of SARS-CoV-2 by FDA under an Emergency Use Authorization (EUA). This EUA will remain  in effect (meaning this test can be used) for the duration of the COVID-19 declaration under Se ction 564(b)(1) of the Act, 21 U.S.C. section 360bbb-3(b)(1), unless the authorization is terminated or revoked sooner.  Performed at [facility], [place] [address] [place], [place] [address]      Time coordinating discharge: Over 30 minutes  SIGNED:   Darliss Cheney, MD  [facility] 04/23/2021, 10:50 AM  If 7PM-7AM, please contact night-coverage [email or web address]

## 2021-04-23 NOTE — Telephone Encounter (Signed)
Patient paperwork given to morrow to be completed.

## 2021-04-24 ENCOUNTER — Other Ambulatory Visit: Payer: Self-pay

## 2021-04-24 ENCOUNTER — Encounter: Payer: Self-pay | Admitting: Registered Nurse

## 2021-04-24 ENCOUNTER — Ambulatory Visit (INDEPENDENT_AMBULATORY_CARE_PROVIDER_SITE_OTHER): Payer: No Typology Code available for payment source | Admitting: Registered Nurse

## 2021-04-24 VITALS — BP 110/50 | HR 75 | Temp 98.2°F | Resp 18 | Ht 67.0 in | Wt 208.8 lb

## 2021-04-24 DIAGNOSIS — K7290 Hepatic failure, unspecified without coma: Secondary | ICD-10-CM | POA: Diagnosis not present

## 2021-04-24 DIAGNOSIS — K7460 Unspecified cirrhosis of liver: Secondary | ICD-10-CM

## 2021-04-24 NOTE — Progress Notes (Signed)
Established Patient Office Visit  Subjective:  Patient ID: Ian Hall, male    DOB: [birth year]  Age: 55 y.o. MRN: [id number]  CC:  Chief Complaint  Patient presents with  . Hospitalization Follow-up    Patient states he iis here for hospital follow up and lab work    HPI Ian Hall presents for Ian Hall  Admitted for hyponatremia Presented to ED on 04/20/21 with weakness and dizziness.  Na at 118 Admitted, 3 day stay [place] balanced, Na replenished to mid 120s, dc'd with follow up today with myself and GI in one week  Today feeling better than prior to admission. No acute concerns. Wants to repeat labs to ensure he is not further decompensating.   Fortunately, Ian Hall is weeks away from being a candidate for consideration for liver transplant. GI will manage his transition to the [facility] transplant team. He has been abstinent from alcohol for over 5 months.   He has continued concerns about finances and work documentation. We have filled out further FMLA paperwork today and will fax to [employer]. Revisited that he will not be able to work for the foreseeable future, if ever, given the ongoing lifesaving treatment he will need.   Past Medical History:  Diagnosis Date  . Anxiety   . Cirrhosis ([place])   . COVID-19     Past Surgical History:  Procedure Laterality Date  . ANKLE SURGERY Right   . IR PARACENTESIS  01/07/2021  . IR PARACENTESIS  01/30/2021  . IR TRANSCATHETER BX  01/09/2021  . IR US GUIDE VASC ACCESS RIGHT  01/09/2021  . IR VENOGRAM HEPATIC W HEMODYNAMIC EVALUATION  01/09/2021    Family History  Problem Relation Age of Onset  . Diabetes Mother   . Memory loss Mother   . Diabetes Father   . Diabetes Cousin   . Colon cancer Neg Hx     Social History   Socioeconomic History  . Marital status: Married    Spouse name: Not on file  . Number of children: 0  . Years of education: Not on file  . Highest education level: Not on file  Occupational History  .  Occupation: IT sales professional: [employer]  Tobacco Use  . Smoking status: Former Smoker    Types: Cigarettes    Quit date: 12/16/2002    Years since quitting: 18.3  . Smokeless tobacco: Never Used  Vaping Use  . Vaping Use: Never used  Substance and Sexual Activity  . Alcohol use: Not Currently    Comment: none for 3-4 years (stated 12/16/2020)  . Drug use: No  . Sexual activity: Yes    Birth control/protection: None  Other Topics Concern  . Not on file  Social History Narrative  . Not on file   Social Determinants of Health   Financial Resource Strain: Not on file  Food Insecurity: Not on file  Transportation Needs: Not on file  Physical Activity: Not on file  Stress: Not on file  Social Connections: Not on file  Intimate Partner Violence: Not on file    Outpatient Medications Prior to Visit  Medication Sig Dispense Refill  . albuterol (VENTOLIN HFA) 108 (90 Base) MCG/ACT inhaler Inhale 2 puffs into the lungs every 6 (six) hours as needed for wheezing or shortness of breath. 8 g 0  . azelastine (ASTELIN) 0.1 % nasal spray Place 1 spray into both nostrils daily. Use in each nostril as directed (Patient taking differently: Place  1 spray into both nostrils daily as needed for allergies. Use in each nostril as directed) 30 mL 12  . dicyclomine (BENTYL) 10 MG capsule Take 1 capsule (10 mg total) by mouth 4 (four) times daily -  before meals and at bedtime. 597 capsule 2  . folic acid (FOLVITE) 1 MG tablet Take 1 tablet (1 mg total) by mouth daily. 30 tablet 1  . furosemide (LASIX) 20 MG tablet Take 1 tablet (20 mg total) by mouth daily. 30 tablet 0  . lactulose (CHRONULAC) 10 GM/15ML solution Take 45 mLs (30 g total) by mouth 3 (three) times daily. 12150 mL 0  . meclizine (ANTIVERT) 12.5 MG tablet Take 1 tablet (12.5 mg total) by mouth daily as needed for dizziness. 30 tablet 0  . pantoprazole (PROTONIX) 40 MG tablet Take 1 tablet (40 mg total) by mouth 2 (two)  times daily. 60 tablet 0  . rifaximin (XIFAXAN) 550 MG TABS tablet Take 1 tablet (550 mg total) by mouth 2 (two) times daily. 180 tablet 0  . spironolactone (ALDACTONE) 100 MG tablet Take 1 tablet (100 mg total) by mouth daily. 90 tablet 0   No facility-administered medications prior to visit.    Allergies  Allergen Reactions  . Hydrocodone Itching and Other (See Comments)    Noted with hydrocodone cough syrup. Itching only, no hives.  02/03/16: denied itching with recent hydrocodone cough syrup. Only one episode of itching previously.     ROS Review of Systems  Constitutional: Negative.   HENT: Negative.   Eyes: Negative.   Respiratory: Negative.   Cardiovascular: Negative.   Gastrointestinal: Negative.   Genitourinary: Negative.   Musculoskeletal: Negative.   Skin: Negative.   Neurological: Negative.   Psychiatric/Behavioral: Negative.   All other systems reviewed and are negative.     Objective:    Physical Exam Constitutional:      General: He is not in acute distress.    Appearance: Normal appearance. He is normal weight. He is not ill-appearing, toxic-appearing or diaphoretic.  Cardiovascular:     Rate and Rhythm: Normal rate and regular rhythm.     Heart sounds: Normal heart sounds. No murmur heard. No friction rub. No gallop.   Pulmonary:     Effort: Pulmonary effort is normal. No respiratory distress.     Breath sounds: Normal breath sounds. No stridor. No wheezing, rhonchi or rales.  Chest:     Chest wall: No tenderness.  Skin:    General: Skin is warm and dry.     Coloration: Skin is jaundiced.  Neurological:     General: No focal deficit present.     Mental Status: He is alert and oriented to person, place, and time. Mental status is at baseline.  Psychiatric:        Mood and Affect: Mood normal.        Behavior: Behavior normal.        Thought Content: Thought content normal.        Judgment: Judgment normal.     BP (!) 110/50   Pulse 75   Temp  98.2 F (36.8 C) (Temporal)   Resp 18   Ht 5' 7"  (1.702 m)   Wt 208 lb 12.8 oz (94.7 kg)   SpO2 99%   BMI 32.70 kg/m  Wt Readings from Last 3 Encounters:  04/24/21 208 lb 12.8 oz (94.7 kg)  04/23/21 218 lb 4.1 oz (99 kg)  04/17/21 204 lb 0.6 oz (92.6 kg)  There are no preventive care reminders to display for this patient.  There are no preventive care reminders to display for this patient.  Lab Results  Component Value Date   TSH 2.534 04/23/2021   Lab Results  Component Value Date   WBC 5.3 04/23/2021   HGB 11.9 (L) 04/23/2021   HCT 33.8 (L) 04/23/2021   MCV 94.4 04/23/2021   PLT 65 (L) 04/23/2021   Lab Results  Component Value Date   NA 126 (L) 04/23/2021   K 5.0 04/23/2021   CO2 21 (L) 04/23/2021   GLUCOSE 92 04/23/2021   BUN 15 04/23/2021   CREATININE 0.63 04/23/2021   BILITOT 7.6 (H) 04/22/2021   ALKPHOS 317 (H) 04/22/2021   AST 163 (H) 04/22/2021   ALT 48 (H) 04/22/2021   PROT 5.8 (L) 04/22/2021   ALBUMIN 2.6 (L) 04/23/2021   CALCIUM 9.1 04/23/2021   ANIONGAP 7 04/23/2021   EGFR 104 02/04/2021   GFR 81.38 04/20/2021   Lab Results  Component Value Date   CHOL 132 01/14/2021   Lab Results  Component Value Date   HDL 12 (L) 01/14/2021   Lab Results  Component Value Date   LDLCALC 96 01/14/2021   Lab Results  Component Value Date   TRIG 96 02/25/2021   Lab Results  Component Value Date   CHOLHDL 11.0 (H) 01/14/2021   Lab Results  Component Value Date   HGBA1C 4.3 (L) 02/04/2021      Assessment & Plan:   Problem List Items Addressed This Visit   None   Visit Diagnoses    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[place])    -  Primary   Relevant Orders   CBC   Comprehensive metabolic panel   Magnesium      No orders of the defined types were placed in this encounter.   Follow-up: No follow-ups on file.   PLAN  fmla paperwork completed  Labs collected. Will follow up with the patient as warranted.  Work note written  Follow  with GI in one week and with my office as scheduled  Patient encouraged to call clinic with any questions, comments, or concerns.  Maximiano Coss, NP

## 2021-04-24 NOTE — Patient Instructions (Addendum)
Mr. Ian Hall -   Always a pleasure.  Labs should be back soon  I'll call you with any concerns.   See you in a few weeks,  [name]     If you have lab work done today you will be contacted with your lab results within the next 2 weeks.  If you have not heard from [place] then please contact us. The fastest way to get your results is to register for My Chart.   IF you received an x-ray today, you will receive an invoice from [facility]. Please contact [facility] at [phone] with questions or concerns regarding your invoice.   IF you received labwork today, you will receive an invoice from [facility]. Please contact [facility] at [phone] with questions or concerns regarding your invoice.   Our billing staff will not be able to assist you with questions regarding bills from these companies.  You will be contacted with the lab results as soon as they are available. The fastest way to get your results is to activate your My Chart account. Instructions are located on the last page of this paperwork. If you have not heard from [place] regarding the results in 2 weeks, please contact this office.

## 2021-04-25 ENCOUNTER — Other Ambulatory Visit: Payer: Self-pay | Admitting: Registered Nurse

## 2021-04-25 DIAGNOSIS — L298 Other pruritus: Secondary | ICD-10-CM

## 2021-04-25 DIAGNOSIS — R0609 Other forms of dyspnea: Secondary | ICD-10-CM

## 2021-04-25 DIAGNOSIS — R0600 Dyspnea, unspecified: Secondary | ICD-10-CM

## 2021-04-25 LAB — COMPREHENSIVE METABOLIC PANEL
AG Ratio: 1.1 (calc) (ref 1.0–2.5)
ALT: 43 U/L (ref 9–46)
AST: 158 U/L — ABNORMAL HIGH (ref 10–35)
Albumin: 2.8 g/dL — ABNORMAL LOW (ref 3.6–5.1)
Alkaline phosphatase (APISO): 385 U/L — ABNORMAL HIGH (ref 35–144)
BUN: 16 mg/dL (ref 7–25)
CO2: 21 mmol/L (ref 20–32)
Calcium: 8.4 mg/dL — ABNORMAL LOW (ref 8.6–10.3)
Chloride: 95 mmol/L — ABNORMAL LOW (ref 98–110)
Creat: 0.96 mg/dL (ref 0.70–1.33)
Globulin: 2.6 g/dL (calc) (ref 1.9–3.7)
Glucose, Bld: 104 mg/dL — ABNORMAL HIGH (ref 65–99)
Potassium: 4.7 mmol/L (ref 3.5–5.3)
Sodium: 123 mmol/L — ABNORMAL LOW (ref 135–146)
Total Bilirubin: 6.8 mg/dL — ABNORMAL HIGH (ref 0.2–1.2)
Total Protein: 5.4 g/dL — ABNORMAL LOW (ref 6.1–8.1)

## 2021-04-25 LAB — CBC
HCT: 33.6 % — ABNORMAL LOW (ref 38.5–50.0)
Hemoglobin: 11.8 g/dL — ABNORMAL LOW (ref 13.2–17.1)
MCH: 33.7 pg — ABNORMAL HIGH (ref 27.0–33.0)
MCHC: 35.1 g/dL (ref 32.0–36.0)
MCV: 96 fL (ref 80.0–100.0)
MPV: 11.4 fL (ref 7.5–12.5)
Platelets: 61 10*3/uL — ABNORMAL LOW (ref 140–400)
RBC: 3.5 10*6/uL — ABNORMAL LOW (ref 4.20–5.80)
RDW: 13.9 % (ref 11.0–15.0)
WBC: 5.7 10*3/uL (ref 3.8–10.8)

## 2021-04-25 LAB — MAGNESIUM: Magnesium: 1.7 mg/dL (ref 1.5–2.5)

## 2021-04-29 NOTE — Telephone Encounter (Signed)
The nurse has been advised that the pt does not need an appt.  The lab is open 730 to 5. The order has been entered

## 2021-04-30 ENCOUNTER — Ambulatory Visit: Payer: No Typology Code available for payment source | Admitting: Registered Nurse

## 2021-04-30 ENCOUNTER — Other Ambulatory Visit (INDEPENDENT_AMBULATORY_CARE_PROVIDER_SITE_OTHER): Payer: No Typology Code available for payment source

## 2021-04-30 DIAGNOSIS — K7460 Unspecified cirrhosis of liver: Secondary | ICD-10-CM | POA: Diagnosis not present

## 2021-04-30 DIAGNOSIS — K7290 Hepatic failure, unspecified without coma: Secondary | ICD-10-CM

## 2021-05-01 LAB — BASIC METABOLIC PANEL
BUN: 17 mg/dL (ref 6–23)
CO2: 20 mEq/L (ref 19–32)
Calcium: 8 mg/dL — ABNORMAL LOW (ref 8.4–10.5)
Chloride: 104 mEq/L (ref 96–112)
Creatinine, Ser: 0.92 mg/dL (ref 0.40–1.50)
GFR: 94.26 mL/min (ref >60.00)
Glucose, Bld: 135 mg/dL — ABNORMAL HIGH (ref 70–99)
Potassium: 4.1 mEq/L (ref 3.5–5.1)
Sodium: 131 mEq/L — ABNORMAL LOW (ref 135–145)

## 2021-05-11 ENCOUNTER — Ambulatory Visit (INDEPENDENT_AMBULATORY_CARE_PROVIDER_SITE_OTHER): Payer: No Typology Code available for payment source | Admitting: Physician Assistant

## 2021-05-11 ENCOUNTER — Encounter: Payer: Self-pay | Admitting: Physician Assistant

## 2021-05-11 ENCOUNTER — Other Ambulatory Visit (INDEPENDENT_AMBULATORY_CARE_PROVIDER_SITE_OTHER): Payer: No Typology Code available for payment source

## 2021-05-11 VITALS — BP 122/82 | HR 87 | Ht 67.0 in | Wt 230.7 lb

## 2021-05-11 DIAGNOSIS — E871 Hypo-osmolality and hyponatremia: Secondary | ICD-10-CM

## 2021-05-11 DIAGNOSIS — K7460 Unspecified cirrhosis of liver: Secondary | ICD-10-CM

## 2021-05-11 DIAGNOSIS — K7011 Alcoholic hepatitis with ascites: Secondary | ICD-10-CM

## 2021-05-11 DIAGNOSIS — K7290 Hepatic failure, unspecified without coma: Secondary | ICD-10-CM

## 2021-05-11 DIAGNOSIS — R609 Edema, unspecified: Secondary | ICD-10-CM | POA: Diagnosis not present

## 2021-05-11 LAB — CBC WITH DIFFERENTIAL/PLATELET
Basophils Absolute: 0 10*3/uL (ref 0.0–0.1)
Basophils Relative: 0.7 % (ref 0.0–3.0)
Eosinophils Absolute: 0.1 10*3/uL (ref 0.0–0.7)
Eosinophils Relative: 2.4 % (ref 0.0–5.0)
HCT: 37.2 % — ABNORMAL LOW (ref 39.0–52.0)
Hemoglobin: 12.8 g/dL — ABNORMAL LOW (ref 13.0–17.0)
Lymphocytes Relative: 25 % (ref 12.0–46.0)
Lymphs Abs: 1.3 10*3/uL (ref 0.7–4.0)
MCHC: 34.3 g/dL (ref 30.0–36.0)
MCV: 96.8 fl (ref 78.0–100.0)
Monocytes Absolute: 0.6 10*3/uL (ref 0.1–1.0)
Monocytes Relative: 11.5 % (ref 3.0–12.0)
Neutro Abs: 3.1 10*3/uL (ref 1.4–7.7)
Neutrophils Relative %: 60.4 % (ref 43.0–77.0)
Platelets: 63 10*3/uL — ABNORMAL LOW (ref 150.0–400.0)
RBC: 3.85 Mil/uL — ABNORMAL LOW (ref 4.22–5.81)
RDW: 17.2 % — ABNORMAL HIGH (ref 11.5–15.5)
WBC: 5.2 10*3/uL (ref 4.0–10.5)

## 2021-05-11 LAB — BASIC METABOLIC PANEL
BUN: 11 mg/dL (ref 6–23)
CO2: 22 mEq/L (ref 19–32)
Calcium: 8.3 mg/dL — ABNORMAL LOW (ref 8.4–10.5)
Chloride: 106 mEq/L (ref 96–112)
Creatinine, Ser: 0.87 mg/dL (ref 0.40–1.50)
GFR: 97.75 mL/min (ref >60.00)
Glucose, Bld: 95 mg/dL (ref 70–99)
Potassium: 4.1 mEq/L (ref 3.5–5.1)
Sodium: 135 mEq/L (ref 135–145)

## 2021-05-11 MED ORDER — FOLIC ACID 1 MG PO TABS: 1.0000 mg | ORAL_TABLET | Freq: Every day | ORAL | 1 refills | Status: DC

## 2021-05-11 MED ORDER — ALBUMIN HUMAN 25 % IV SOLN: 75.0000 g | Freq: Once | INTRAVENOUS | Status: DC

## 2021-05-11 MED ORDER — PANTOPRAZOLE SODIUM 40 MG PO TBEC: 40.0000 mg | DELAYED_RELEASE_TABLET | Freq: Two times a day (BID) | ORAL | 0 refills | Status: DC

## 2021-05-11 MED ORDER — RIFAXIMIN 550 MG PO TABS: 550.0000 mg | ORAL_TABLET | Freq: Two times a day (BID) | ORAL | 0 refills | Status: AC

## 2021-05-11 MED ORDER — SPIRONOLACTONE 100 MG PO TABS: 100.0000 mg | ORAL_TABLET | Freq: Every day | ORAL | 6 refills | Status: DC

## 2021-05-11 MED ORDER — MECLIZINE HCL 12.5 MG PO TABS
12.5000 mg | ORAL_TABLET | Freq: Every day | ORAL | 0 refills | Status: DC | PRN
Start: 2021-05-11 — End: 2021-06-10

## 2021-05-11 MED ORDER — FUROSEMIDE 20 MG PO TABS: 20.0000 mg | ORAL_TABLET | Freq: Every day | ORAL | 6 refills | Status: DC

## 2021-05-11 MED ORDER — LACTULOSE 10 GM/15ML PO SOLN: 30.0000 g | Freq: Three times a day (TID) | ORAL | 0 refills | Status: AC

## 2021-05-11 MED ORDER — DICYCLOMINE HCL 10 MG PO CAPS: 10.0000 mg | ORAL_CAPSULE | Freq: Three times a day (TID) | ORAL | 2 refills | Status: DC

## 2021-05-11 NOTE — Patient Instructions (Addendum)
If you are age 55 or younger, your body mass index should be between 19-25. Your Body mass index is 36.13 kg/m. If this is out of the aformentioned range listed, please consider follow up with your Primary Care Provider.  __________________________________________________________  The [facility] providers would like to encourage you to use [facility] to communicate with providers for non-urgent requests or questions.  Due to long hold times on the telephone, sending your provider a message by [facility] may be a faster and more efficient way to get a response.  Please allow 48 business hours for a response.  Please remember that this is for non-urgent requests.   Your provider has requested that you go to the basement level for lab work before leaving today. Press "B" on the elevator. The lab is located at the first door on the left as you exit the elevator.  You have been scheduled for an abdominal paracentesis at [facility] radiology (1st floor of hospital) on 05/12/2021 at 2:00 pm. Please arrive at least 15 minutes prior to your appointment time for registration. Should you need to reschedule this appointment for any reason, please call our office at [phone].  The following medications have been sent to your pharmacy: Spironolactone, Furosemide, Xifaxan, Folic Acid, Meclizine, Pantoprazole, Lactulose, and Bentyl.  Follow up pending  Thank you for entrusting me with your care and choosing [facility].  Amy Esterwood, PA-C

## 2021-05-11 NOTE — Progress Notes (Signed)
Reviewed and agree with management plans. ? ?Shannon Balthazar L. Kailen Hinkle, MD, MPH  ?

## 2021-05-11 NOTE — Progress Notes (Signed)
Subjective:    Patient ID: Ian Hall, male    DOB: 1965-12-30, 55 y.o.   MRN: [id number]  HPI Ian Hall is a pleasant 55 year old Hispanic male, established with Dr. Tarri Glenn who comes in today for post hospital follow-up after recent admission 5/23 through 04/23/2021 primarily for hyponatremia.  Patient has history of decompensated alcohol induced cirrhosis, previous alcoholic hepatitis and hepatic encephalopathy.  He has been abstinent from EtOH since December 2021. Outpatient labs on 04/20/2021 had shown a worsening hyponatremia with sodium dropping from 124 to 118.  Other parameters were stable.  Initially furosemide 40 mg daily was held and he was continued on spironolactone 100 mg daily. He remained asymptomatic.  Sodium gradually improved and on discharge was 123. Weight 203 on admission. Per discharge instructions he was to resume Lasix at 20 mg p.o. every morning and continue spironolactone at 100 mg p.o. daily. He had follow-up labs done on 04/30/2021 showed sodium 131 potassium 4.1/creatinine 0.92  Today he comes in stating that he is feeling very swollen and he is retaining a lot of fluid.  His weight is up to 230 pounds over the past couple of weeks up a total of about 25 pounds.  He is complaining of significant increase in abdominal girth to the point of feeling very tight full and somewhat more short of breath.  He is sleeping propped up.  He is also developed edema in his lower extremities. On careful review of his meds he thought he was supposed to stay off the spironolactone and has only been taking the Lasix 20 mg p.o. daily. He has been taking other medications as instructed and is requesting refills on several meds today. Referral to [facility] has been placed for potential pretransplant evaluation as he has been abstinent from EtOH over the past 6 months.  Review of Systems.Pertinent positive and negative review of systems were noted in the above HPI section.  All other  review of systems was otherwise negative.    Outpatient Encounter Medications as of 05/11/2021  Medication Sig   albuterol (VENTOLIN HFA) 108 (90 Base) MCG/ACT inhaler INHALE 2 PUFFS INTO THE LUNGS EVERY 6 HOURS AS NEEDED FOR WHEEZING OR SHORTNESS OF BREATH   azelastine (ASTELIN) 0.1 % nasal spray Place 1 spray into both nostrils daily. Use in each nostril as directed (Patient taking differently: Place 1 spray into both nostrils daily as needed for allergies. Use in each nostril as directed)   hydrOXYzine (ATARAX/VISTARIL) 10 MG tablet TAKE 1/2 TO 1 TABLET(5 TO 10 MG) BY MOUTH AT BEDTIME AS NEEDED FOR ITCHING   [DISCONTINUED] dicyclomine (BENTYL) 10 MG capsule Take 1 capsule (10 mg total) by mouth 4 (four) times daily -  before meals and at bedtime.   [DISCONTINUED] folic acid (FOLVITE) 1 MG tablet Take 1 tablet (1 mg total) by mouth daily.   [DISCONTINUED] furosemide (LASIX) 20 MG tablet Take 1 tablet (20 mg total) by mouth daily.   [DISCONTINUED] lactulose (CHRONULAC) 10 GM/15ML solution Take 45 mLs (30 g total) by mouth 3 (three) times daily.   [DISCONTINUED] meclizine (ANTIVERT) 12.5 MG tablet Take 1 tablet (12.5 mg total) by mouth daily as needed for dizziness.   [DISCONTINUED] pantoprazole (PROTONIX) 40 MG tablet Take 1 tablet (40 mg total) by mouth 2 (two) times daily.   [DISCONTINUED] rifaximin (XIFAXAN) 550 MG TABS tablet Take 1 tablet (550 mg total) by mouth 2 (two) times daily.   [DISCONTINUED] spironolactone (ALDACTONE) 100 MG tablet Take 1 tablet (100 mg  total) by mouth daily.   dicyclomine (BENTYL) 10 MG capsule Take 1 capsule (10 mg total) by mouth 4 (four) times daily -  before meals and at bedtime.   folic acid (FOLVITE) 1 MG tablet Take 1 tablet (1 mg total) by mouth daily.   furosemide (LASIX) 20 MG tablet Take 1 tablet (20 mg total) by mouth daily.   lactulose (CHRONULAC) 10 GM/15ML solution Take 45 mLs (30 g total) by mouth 3 (three) times daily.   meclizine (ANTIVERT) 12.5 MG  tablet Take 1 tablet (12.5 mg total) by mouth daily as needed for dizziness.   pantoprazole (PROTONIX) 40 MG tablet Take 1 tablet (40 mg total) by mouth 2 (two) times daily.   rifaximin (XIFAXAN) 550 MG TABS tablet Take 1 tablet (550 mg total) by mouth 2 (two) times daily.   spironolactone (ALDACTONE) 100 MG tablet Take 1 tablet (100 mg total) by mouth daily.   Facility-Administered Encounter Medications as of 05/11/2021  Medication   albumin human 25 % solution 75 g   Allergies  Allergen Reactions   Hydrocodone Itching and Other (See Comments)    Noted with hydrocodone cough syrup. Itching only, no hives.  02/03/16: denied itching with recent hydrocodone cough syrup. Only one episode of itching previously.    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)    Hyperbilirubinemia    Alcoholic hepatitis with ascites 12/26/2020   Hepatitis, alcoholic, acute 18/84/1660   Other viral warts 03/22/2018   Social History   Socioeconomic History   Marital status: Married    Spouse name: Not on file   Number of children: 0   Years of education: Not on file   Highest education level: Not on file  Occupational History   Occupation: IT sales professional: [employer]  Tobacco Use   Smoking status: Former    Pack years: 0.00    Types: Cigarettes    Quit date: 12/16/2002    Years since quitting: 18.4   Smokeless tobacco: Never  Vaping Use   Vaping  Use: Never used  Substance and Sexual Activity   Alcohol use: Not Currently    Comment: none for 3-4 years (stated 12/16/2020)   Drug use: No   Sexual activity: Yes    Birth control/protection: None  Other Topics Concern   Not on file  Social History Narrative   Not on file   Social Determinants of Health   Financial Resource Strain: Not on file  Food Insecurity: Not on file  Transportation Needs: Not on file  Physical Activity: Not on file  Stress: Not on file  Social Connections: Not on file  Intimate Partner Violence: Not on file    Mr. Ian Hall family history includes Diabetes in his cousin, father, and mother; Memory loss in his mother.      Objective:    Vitals:   05/11/21 0850  BP: 122/82  Pulse: 87  SpO2: 99%  Physical Exam.Well-developed well-nourished older Hispanic male in no acute distress.  Height, Weight, 230 BMI 36.1  HEENT; nontraumatic normocephalic, EOMI, PE R LA, sclera icteric Oropharynx; not examined today Neck; supple, no JVD Cardiovascular; regular rate and rhythm with S1-S2, no murmur rub or gallop Pulmonary; Clear bilaterally Abdomen; protuberant, tense ascites, no focal tenderness no palpable hepatosplenomegaly, bowel sounds are active Rectal; not done today Skin; benign exam, no jaundice rash or appreciable lesions Extremities; no clubbing cyanosis, 2+ edema to the upper shins bilaterally Neuro/Psych; alert and oriented x4, grossly nonfocal mood and affect appropriate, no asterixis       Assessment & Plan:   #70 55 year old white male with decompensated EtOH induced cirrhosis who comes in today for post hospital follow-up after recent admission with hyponatremia.  He misunderstood the instructions for his diuretics at the time of discharge from the hospital and has only been taking Lasix 20 mg p.o. every morning which is a reduction from 40 mg daily prior to admission.  He was supposed to continue spironolactone 100 mg daily but has  held that. Subsequent 25 pound weight loss with peripheral edema and significant accumulation of ascites which is fairly tense at this point.  #2 hyponatremia resolved #3 continued abstinence from EtOH. 4.  GERD stable 5.  History of DVT not on anticoagulation 6.  Colon cancer screening-patient had colonoscopy February 2022 with diverticulosis, and poor prep #7 esophageal variceal surveillance-no varices on EGD February 2022 #8 hepatic encephalopathy-stable on lactulose and Xifaxan  Plan; patient instructed to continue Lasix 20 mg p.o. every morning Restart spironolactone 100 mg p.o. every morning, both meds refilled Continue Xifaxan 550 p.o. twice daily Continue lactulose 45 cc 3 times daily Refill meclizine 12.5 mg daily as needed Refill folic acid 1 mg daily Refill dicyclomine 10 mg p.o. AC as needed for abdominal cramping. BMET. CBC We rediscussed 2 g sodium diet Patient will be scheduled for large-volume paracentesis-remove 4 to 5 L no cell counts needed.  We will also received 75 g of 25% albumin at the time of paracentesis. Patient has follow-up office visit with Dr. Tarri Glenn in about 2 weeks and will keep that appointment. Hopefully will be able to get in with [facility] hepatology soon as well.  Temari Schooler Genia Harold PA-C 05/11/2021   Cc: Maximiano Coss, NP

## 2021-05-12 ENCOUNTER — Ambulatory Visit (HOSPITAL_COMMUNITY)
Admission: RE | Admit: 2021-05-12 | Discharge: 2021-05-12 | Disposition: A | Discharge status: Home / Self Care | Payer: No Typology Code available for payment source | Source: Ambulatory Visit | Attending: Physician Assistant | Admitting: Physician Assistant

## 2021-05-12 ENCOUNTER — Other Ambulatory Visit: Payer: Self-pay

## 2021-05-12 DIAGNOSIS — K7011 Alcoholic hepatitis with ascites: Secondary | ICD-10-CM

## 2021-05-12 DIAGNOSIS — K7290 Hepatic failure, unspecified without coma: Secondary | ICD-10-CM | POA: Diagnosis present

## 2021-05-12 DIAGNOSIS — K7460 Unspecified cirrhosis of liver: Secondary | ICD-10-CM | POA: Diagnosis not present

## 2021-05-12 DIAGNOSIS — E871 Hypo-osmolality and hyponatremia: Secondary | ICD-10-CM

## 2021-05-12 DIAGNOSIS — R609 Edema, unspecified: Secondary | ICD-10-CM | POA: Diagnosis not present

## 2021-05-12 HISTORY — PX: IR PARACENTESIS: IMG2679

## 2021-05-12 MED ORDER — LIDOCAINE HCL 1 % IJ SOLN
INTRAMUSCULAR | Status: DC | PRN
  Administered 2021-05-12: 10 mL

## 2021-05-12 MED ORDER — LIDOCAINE HCL (PF) 1 % IJ SOLN
INTRAMUSCULAR | Status: AC
  Filled 2021-05-12: qty 30

## 2021-05-12 NOTE — Procedures (Signed)
PROCEDURE SUMMARY:  Successful image-guided paracentesis from the right lateral abdomen.  Yielded 3.4 liters of clear gold fluid.  No immediate complications.  EBL < 1 mL. Patient tolerated well.   Specimen was not sent for labs.  Please see imaging section of Epic for full dictation.   Earley Abide PA-C 05/12/2021 2:39 PM

## 2021-05-15 ENCOUNTER — Other Ambulatory Visit: Payer: Self-pay

## 2021-05-15 ENCOUNTER — Ambulatory Visit (INDEPENDENT_AMBULATORY_CARE_PROVIDER_SITE_OTHER): Payer: No Typology Code available for payment source | Admitting: Registered Nurse

## 2021-05-15 ENCOUNTER — Encounter: Payer: Self-pay | Admitting: Registered Nurse

## 2021-05-15 VITALS — BP 124/78 | HR 81 | Temp 98.2°F | Ht 67.0 in | Wt 227.2 lb

## 2021-05-15 DIAGNOSIS — K7290 Hepatic failure, unspecified without coma: Secondary | ICD-10-CM

## 2021-05-15 DIAGNOSIS — K7460 Unspecified cirrhosis of liver: Secondary | ICD-10-CM | POA: Diagnosis not present

## 2021-05-15 DIAGNOSIS — L298 Other pruritus: Secondary | ICD-10-CM

## 2021-05-15 LAB — COMPREHENSIVE METABOLIC PANEL
ALT: 31 U/L (ref 0–53)
AST: 141 U/L — ABNORMAL HIGH (ref 0–37)
Albumin: 2.8 g/dL — ABNORMAL LOW (ref 3.5–5.2)
Alkaline Phosphatase: 340 U/L — ABNORMAL HIGH (ref 39–117)
BUN: 11 mg/dL (ref 6–23)
CO2: 22 mEq/L (ref 19–32)
Calcium: 8.1 mg/dL — ABNORMAL LOW (ref 8.4–10.5)
Chloride: 106 mEq/L (ref 96–112)
Creatinine, Ser: 0.88 mg/dL (ref 0.40–1.50)
GFR: 97.41 mL/min (ref >60.00)
Glucose, Bld: 100 mg/dL — ABNORMAL HIGH (ref 70–99)
Potassium: 3.7 mEq/L (ref 3.5–5.1)
Sodium: 135 mEq/L (ref 135–145)
Total Bilirubin: 9 mg/dL — ABNORMAL HIGH (ref 0.2–1.2)
Total Protein: 5.3 g/dL — ABNORMAL LOW (ref 6.0–8.3)

## 2021-05-15 LAB — CBC WITH DIFFERENTIAL/PLATELET
Basophils Absolute: 0 10*3/uL (ref 0.0–0.1)
Basophils Relative: 1 % (ref 0.0–3.0)
Eosinophils Absolute: 0.1 10*3/uL (ref 0.0–0.7)
Eosinophils Relative: 2.8 % (ref 0.0–5.0)
HCT: 35.9 % — ABNORMAL LOW (ref 39.0–52.0)
Hemoglobin: 12.4 g/dL — ABNORMAL LOW (ref 13.0–17.0)
Lymphocytes Relative: 20.8 % (ref 12.0–46.0)
Lymphs Abs: 0.9 10*3/uL (ref 0.7–4.0)
MCHC: 34.5 g/dL (ref 30.0–36.0)
MCV: 96.9 fl (ref 78.0–100.0)
Monocytes Absolute: 0.5 10*3/uL (ref 0.1–1.0)
Monocytes Relative: 10.8 % (ref 3.0–12.0)
Neutro Abs: 2.8 10*3/uL (ref 1.4–7.7)
Neutrophils Relative %: 64.6 % (ref 43.0–77.0)
Platelets: 64 10*3/uL — ABNORMAL LOW (ref 150.0–400.0)
RBC: 3.71 Mil/uL — ABNORMAL LOW (ref 4.22–5.81)
RDW: 17 % — ABNORMAL HIGH (ref 11.5–15.5)
WBC: 4.4 10*3/uL (ref 4.0–10.5)

## 2021-05-15 LAB — AMMONIA: Ammonia: 52 umol/L — ABNORMAL HIGH (ref 11–35)

## 2021-05-15 MED ORDER — HYDROXYZINE HCL 10 MG PO TABS: ORAL_TABLET | ORAL | 0 refills | Status: DC

## 2021-05-15 NOTE — Patient Instructions (Signed)
Mr. Depoy-  Doristine Devoid to see you  Sorry about the ongoing swelling - unfortunately we may just have to bear with that until we can see [facility] and what they have to say.   Labs today will be back tomorrow or the day after.   I'll be in touch soon  Thank you  Denice Paradise

## 2021-05-15 NOTE — Addendum Note (Signed)
Addended by: Loralyn Freshwater L on: 05/15/2021 10:00 AM   Modules accepted: Orders

## 2021-05-15 NOTE — Progress Notes (Signed)
Established Patient Office Visit  Subjective:  Patient ID: Ian Hall, male    DOB: 07/06/1966  Age: 55 y.o. MRN: [id number]  CC:  Chief Complaint  Patient presents with   Edema    Legs and abdomen    HPI Ian Hall presents for follow up   Decompensated cirrhosis of liver Fortunately has just about hit the 6 mo mark of abstinence from etoh - has upcoming appt with [facility] - will start process of liver transplant with this group  Follows with Dr. Tarri Glenn as local GI, was seen by Nicoletta Ba PA in that office on 05/11/21 with notable ascites. Had paracentesis on 05/12/21 that yielded 3.4L fluid  He does note ongoing swelling. Both legs and abdomen. Again feels tight, some pressure. Has been taking diuretics as directed - lasix 35m PO qd and spironolactone 1066mPO qd. No Aes this time.   Past Medical History:  Diagnosis Date   Anxiety    [place]   COVID-19     Past Surgical History:  Procedure Laterality Date   ANKLE SURGERY Right    IR PARACENTESIS  01/07/2021   IR PARACENTESIS  01/30/2021   IR PARACENTESIS  05/12/2021   IR TRANSCATHETER BX  01/09/2021   IR [place] VASC ACCESS RIGHT  01/09/2021   IR VENOGRAM HEPATIC W HEMODYNAMIC EVALUATION  01/09/2021    Family History  Problem Relation Age of Onset   Diabetes Mother    Memory loss Mother    Diabetes Father    Diabetes Cousin    Colon cancer Neg Hx     Social History   Socioeconomic History   Marital status: Married    Spouse name: Not on file   Number of children: 0   Years of education: Not on file   Highest education level: Not on file  Occupational History   Occupation: [employer]  Tobacco Use   Smoking status: Former    Pack years: 0.00    Types: Cigarettes    Quit date: 12/16/2002    Years since quitting: 18.4   Smokeless tobacco: Never  Vaping Use   Vaping Use: Never used  Substance and Sexual Activity   Alcohol use: Not Currently     Comment: none for 3-4 years (stated 12/16/2020)   Drug use: No   Sexual activity: Yes    Birth control/protection: None  Other Topics Concern   Not on file  Social History Narrative   Not on file   Social Determinants of Health   Financial Resource Strain: Not on file  Food Insecurity: Not on file  Transportation Needs: Not on file  Physical Activity: Not on file  Stress: Not on file  Social Connections: Not on file  Intimate Partner Violence: Not on file    Outpatient Medications Prior to Visit  Medication Sig Dispense Refill   albuterol (VENTOLIN HFA) 108 (90 Base) MCG/ACT inhaler INHALE 2 PUFFS INTO THE LUNGS EVERY 6 HOURS AS NEEDED FOR WHEEZING OR SHORTNESS OF BREATH 6.7 g 3   dicyclomine (BENTYL) 10 MG capsule Take 1 capsule (10 mg total) by mouth 4 (four) times daily -  before meals and at bedtime. 12409apsule 2   folic acid (FOLVITE) 1 MG tablet Take 1 tablet (1 mg total) by mouth daily. 30 tablet 1   furosemide (LASIX) 20 MG tablet Take 1 tablet (20 mg total) by mouth daily. 30 tablet 6   hydrOXYzine (ATARAX/VISTARIL)  10 MG tablet TAKE 1/2 TO 1 TABLET(5 TO 10 MG) BY MOUTH AT BEDTIME AS NEEDED FOR ITCHING 30 tablet 0   lactulose (CHRONULAC) 10 GM/15ML solution Take 45 mLs (30 g total) by mouth 3 (three) times daily. 12150 mL 0   meclizine (ANTIVERT) 12.5 MG tablet Take 1 tablet (12.5 mg total) by mouth daily as needed for dizziness. 30 tablet 0   pantoprazole (PROTONIX) 40 MG tablet Take 1 tablet (40 mg total) by mouth 2 (two) times daily. 60 tablet 0   rifaximin (XIFAXAN) 550 MG TABS tablet Take 1 tablet (550 mg total) by mouth 2 (two) times daily. 180 tablet 0   spironolactone (ALDACTONE) 100 MG tablet Take 1 tablet (100 mg total) by mouth daily. 30 tablet 6   azelastine (ASTELIN) 0.1 % nasal spray Place 1 spray into both nostrils daily. Use in each nostril as directed (Patient taking differently: Place 1 spray into both nostrils daily as needed for allergies. Use in each  nostril as directed) 30 mL 12   Facility-Administered Medications Prior to Visit  Medication Dose Route Frequency Provider Last Rate Last Admin   albumin human 25 % solution 75 g  75 g Intravenous Once Esterwood, Amy S, PA-C        Allergies  Allergen Reactions   Hydrocodone Itching and Other (See Comments)    Noted with hydrocodone cough syrup. Itching only, no hives.  02/03/16: denied itching with recent hydrocodone cough syrup. Only one episode of itching previously.     ROS Review of Systems  Constitutional: Negative.   HENT: Negative.    Eyes: Negative.   Respiratory: Negative.    Cardiovascular: Negative.   Gastrointestinal: Negative.   Genitourinary: Negative.   Musculoskeletal: Negative.   Skin: Negative.   Neurological: Negative.   Psychiatric/Behavioral: Negative.    All other systems reviewed and are negative.    Objective:    Physical Exam Constitutional:      General: He is not in acute distress.    Appearance: Normal appearance. He is normal weight. He is not ill-appearing, toxic-appearing or diaphoretic.  Cardiovascular:     Rate and Rhythm: Normal rate and regular rhythm.     Heart sounds: Normal heart sounds. No murmur heard.   No friction rub. No gallop.  Pulmonary:     Effort: Pulmonary effort is normal. No respiratory distress.     Breath sounds: Normal breath sounds. No stridor. No wheezing, rhonchi or rales.  Chest:     Chest wall: No tenderness.  Neurological:     General: No focal deficit present.     Mental Status: He is alert and oriented to person, place, and time. Mental status is at baseline.  Psychiatric:        Mood and Affect: Mood normal.        Behavior: Behavior normal.        Thought Content: Thought content normal.        Judgment: Judgment normal.    BP 124/78   Pulse 81   Temp 98.2 F (36.8 C)   Ht 5' 7"  (1.702 m)   Wt 227 lb 3.2 oz (103.1 kg)   SpO2 97%   BMI 35.58 kg/m  Wt Readings from Last 3 Encounters:   05/15/21 227 lb 3.2 oz (103.1 kg)  05/11/21 230 lb 11.2 oz (104.6 kg)  04/24/21 208 lb 12.8 oz (94.7 kg)     There are no preventive care reminders to display for this patient.  There  are no preventive care reminders to display for this patient.  Lab Results  Component Value Date   TSH 2.534 04/23/2021   Lab Results  Component Value Date   WBC 5.2 05/11/2021   HGB 12.8 (L) 05/11/2021   HCT 37.2 (L) 05/11/2021   MCV 96.8 05/11/2021   PLT 63.0 Repeated and verified X2. (L) 05/11/2021   Lab Results  Component Value Date   NA 135 05/11/2021   K 4.1 05/11/2021   CO2 22 05/11/2021   GLUCOSE 95 05/11/2021   BUN 11 05/11/2021   CREATININE 0.87 05/11/2021   BILITOT 6.8 (H) 04/24/2021   ALKPHOS 317 (H) 04/22/2021   AST 158 (H) 04/24/2021   ALT 43 04/24/2021   PROT 5.4 (L) 04/24/2021   ALBUMIN 2.6 (L) 04/23/2021   CALCIUM 8.3 (L) 05/11/2021   ANIONGAP 7 04/23/2021   EGFR 104 02/04/2021   GFR 97.75 05/11/2021   Lab Results  Component Value Date   CHOL 132 01/14/2021   Lab Results  Component Value Date   HDL 12 (L) 01/14/2021   Lab Results  Component Value Date   LDLCALC 96 01/14/2021   Lab Results  Component Value Date   TRIG 96 02/25/2021   Lab Results  Component Value Date   CHOLHDL 11.0 (H) 01/14/2021   Lab Results  Component Value Date   HGBA1C 4.3 (L) 02/04/2021      Assessment & Plan:   Problem List Items Addressed This Visit   None Visit Diagnoses     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[place])    -  Primary   Relevant Orders   CBC with Differential/Platelet   Comprehensive metabolic panel   Ammonia       No orders of the defined types were placed in this encounter.   Follow-up: No follow-ups on file.   PLAN Discussed edema. 2+ pitting ble. Some abdominal ascites. Improved based on notes from GI, but still definitely present. Hesitant to increase lasix. Already at max on spironolactone. Discussed nonpharm and diet control Check labs  today He has appt with [facility] next week to start process for transplant.  Patient encouraged to call clinic with any questions, comments, or concerns. Maximiano Coss, NP

## 2021-05-21 ENCOUNTER — Ambulatory Visit: Payer: No Typology Code available for payment source | Admitting: Gastroenterology

## 2021-05-21 ENCOUNTER — Other Ambulatory Visit: Payer: Self-pay

## 2021-05-21 ENCOUNTER — Encounter: Payer: Self-pay | Admitting: Gastroenterology

## 2021-05-21 VITALS — BP 110/60 | HR 81 | Ht 66.0 in | Wt 233.4 lb

## 2021-05-21 DIAGNOSIS — K760 Fatty (change of) liver, not elsewhere classified: Secondary | ICD-10-CM

## 2021-05-21 DIAGNOSIS — K766 Portal hypertension: Secondary | ICD-10-CM | POA: Diagnosis not present

## 2021-05-21 DIAGNOSIS — K7031 Alcoholic cirrhosis of liver with ascites: Secondary | ICD-10-CM

## 2021-05-21 DIAGNOSIS — K7460 Unspecified cirrhosis of liver: Secondary | ICD-10-CM

## 2021-05-21 DIAGNOSIS — K7290 Hepatic failure, unspecified without coma: Secondary | ICD-10-CM | POA: Diagnosis not present

## 2021-05-21 DIAGNOSIS — K7682 Hepatic encephalopathy: Secondary | ICD-10-CM

## 2021-05-21 MED ORDER — FUROSEMIDE 20 MG PO TABS: 20.0000 mg | ORAL_TABLET | Freq: Every day | ORAL | 6 refills | Status: DC

## 2021-05-21 MED ORDER — SPIRONOLACTONE 100 MG PO TABS: 100.0000 mg | ORAL_TABLET | Freq: Every day | ORAL | 6 refills | Status: DC

## 2021-05-21 NOTE — Patient Instructions (Addendum)
It was my pleasure to provide care to you you today. Based on our discussion, I am providing you with my recommendations below:  RECOMMENDATION(S):   Abstain from alcohol consumption  Please keep your appointment with [facility]  Please continue Lasix and Aldactone as prescribed  IMAGING:  You will be contacted by [facility] (Your caller ID will indicate phone # [phone]) within the next business 7-10 business days to schedule your MRI. If you have not heard from them within 7-10 business days, please call [facility] at [phone] to follow up on the status of your appointment.     FOLLOW UP:  I would like for you to follow up with me in 1 month. Please refer to your appointments contained within this After Visit Summary.  BMI:  If you are age 55 or younger, your body mass index should be between 19-25. Your There is no height or weight on file to calculate BMI. If this is out of the aformentioned range listed, please consider follow up with your Primary Care Provider.   MY CHART:  The [facility] providers would like to encourage you to use [facility] to communicate with providers for non-urgent requests or questions.  Due to long hold times on the telephone, sending your provider a message by [facility] may be a faster and more efficient way to get a response.  Please allow 48 business hours for a response.  Please remember that this is for non-urgent requests.   Thank you for trusting me with your gastrointestinal care!    Thornton Park, MD, MPH  Two Gram Sodium Diet 2000 mg  What is Sodium? Sodium is a mineral found naturally in many foods. The most significant source of sodium in the diet is table salt, which is about 40% sodium.  Processed, convenience, and preserved foods also contain a large amount of sodium.  The body needs only 500 mg of sodium daily to function,  A normal diet provides more than enough sodium even  if you do not use salt.  Why Limit Sodium? A build up of sodium in the body can cause thirst, increased blood pressure, shortness of breath, and water retention.  Decreasing sodium in the diet can reduce edema and risk of heart attack or stroke associated with high blood pressure.  Keep in mind that there are many other factors involved in these health problems.  Heredity, obesity, lack of exercise, cigarette smoking, stress and what you eat all play a role.  General Guidelines: Do not add salt at the table or in cooking.  One teaspoon of salt contains over 2 grams of sodium. Read food labels Avoid processed and convenience foods Ask your dietitian before eating any foods not dicussed in the menu planning guidelines Consult your physician if you wish to use a salt substitute or a sodium containing medication such as antacids.  Limit milk and milk products to 16 oz (2 cups) per day.  Shopping Hints: READ LABELS!! "Dietetic" does not necessarily mean low sodium. Salt and other sodium ingredients are often added to foods during processing.    Menu Planning Guidelines Food Group Choose More Often Avoid  Beverages (see also the milk group All fruit juices, low-sodium, salt-free vegetables juices, low-sodium carbonated beverages Regular vegetable or tomato juices, commercially softened water used for drinking or cooking  Breads and Cereals Enriched white, wheat, rye and pumpernickel bread, hard rolls and dinner rolls; muffins, cornbread and waffles; most dry  cereals, cooked cereal without added salt; unsalted crackers and breadsticks; low sodium or homemade bread crumbs Bread, rolls and crackers with salted tops; quick breads; instant hot cereals; pancakes; commercial bread stuffing; self-rising flower and biscuit mixes; regular bread crumbs or cracker crumbs  Desserts and Sweets Desserts and sweets mad with mild should be within allowance Instant pudding mixes and cake mixes  Fats Butter or  margarine; vegetable oils; unsalted salad dressings, regular salad dressings limited to 1 Tbs; light, sour and heavy cream Regular salad dressings containing bacon fat, bacon bits, and salt pork; snack dips made with instant soup mixes or processed cheese; salted nuts  Fruits Most fresh, frozen and canned fruits Fruits processed with salt or sodium-containing ingredient (some dried fruits are processed with sodium sulfites        Vegetables Fresh, frozen vegetables and low- sodium canned vegetables Regular canned vegetables, sauerkraut, pickled vegetables, and others prepared in brine; frozen vegetables in sauces; vegetables seasoned with ham, bacon or salt pork  Condiments, Sauces, Miscellaneous  Salt substitute with physician's approval; pepper, herbs, spices; vinegar, lemon or lime juice; hot pepper sauce; garlic powder, onion powder, low sodium soy sauce (1 Tbs.); low sodium condiments (ketchup, chili sauce, mustard) in limited amounts (1 tsp.) fresh ground horseradish; unsalted tortilla chips, pretzels, potato chips, popcorn, salsa (1/4 cup) Any seasoning made with salt including garlic salt, celery salt, onion salt, and seasoned salt; sea salt, rock salt, kosher salt; meat tenderizers; monosodium glutamate; mustard, regular soy sauce, barbecue, sauce, chili sauce, teriyaki sauce, steak sauce, Worcestershire sauce, and most flavored vinegars; canned gravy and mixes; regular condiments; salted snack foods, olives, picles, relish, horseradish sauce, catsup   Food preparation: Try these seasonings Meats:    Pork Sage, onion Serve with applesauce  Chicken Poultry seasoning, thyme, parsley Serve with cranberry sauce  Lamb Curry powder, rosemary, garlic, thyme Serve with mint sauce or jelly  Veal Marjoram, basil Serve with current jelly, cranberry sauce  Beef Pepper, bay leaf Serve with dry mustard, unsalted chive butter  Fish Bay leaf, dill Serve with unsalted lemon butter, unsalted parsley butter   Vegetables:    Asparagus Lemon juice   Broccoli Lemon juice   Carrots Mustard dressing parsley, mint, nutmeg, glazed with unsalted butter and sugar   Green beans Marjoram, lemon juice, nutmeg,dill seed   Tomatoes Basil, marjoram, onion   Spice /blend for [employer]" 4 tsp ground thyme 1 tsp ground sage 3 tsp ground rosemary 4 tsp ground marjoram   Test your knowledge A product that says "Salt Free" may still contain sodium. True or False Garlic Powder and Hot Pepper Sauce an be used as alternative seasonings.True or False Processed foods have more sodium than fresh foods.  True or False Canned Vegetables have less sodium than froze True or False   WAYS TO DECREASE YOUR SODIUM INTAKE Avoid the use of added salt in cooking and at the table.  Table salt (and other prepared seasonings which contain salt) is probably one of the greatest sources of sodium in the diet.  Unsalted foods can gain flavor from the sweet, sour, and butter taste sensations of herbs and spices.  Instead of using salt for seasoning, try the following seasonings with the foods listed.  Remember: how you use them to enhance natural food flavors is limited only by your creativity... Allspice-Meat, fish, eggs, fruit, peas, red and yellow vegetables Almond Extract-Fruit baked goods Anise Seed-Sweet breads, fruit, carrots, beets, cottage cheese, cookies (tastes like licorice) Basil-Meat, fish, eggs,  vegetables, rice, vegetables salads, soups, sauces Bay Leaf-Meat, fish, stews, poultry Burnet-Salad, vegetables (cucumber-like flavor) Caraway Seed-Bread, cookies, cottage cheese, meat, vegetables, cheese, rice Cardamon-Baked goods, fruit, soups Celery Powder or seed-Salads, salad dressings, sauces, meatloaf, soup, bread.Do not use  celery salt Chervil-Meats, salads, fish, eggs, vegetables, cottage cheese (parsley-like flavor) Chili Power-Meatloaf, chicken cheese, corn, eggplant, egg dishes Chives-Salads cottage cheese, egg  dishes, soups, vegetables, sauces Cilantro-Salsa, casseroles Cinnamon-Baked goods, fruit, pork, lamb, chicken, carrots Cloves-Fruit, baked goods, fish, pot roast, green beans, beets, carrots Coriander-Pastry, cookies, meat, salads, cheese (lemon-orange flavor) Cumin-Meatloaf, fish,cheese, eggs, cabbage,fruit pie (caraway flavor) [employer], fruit, eggs, fish, poultry, cottage cheese, vegetables Dill Seed-Meat, cottage cheese, poultry, vegetables, fish, salads, bread Fennel Seed-Bread, cookies, apples, pork, eggs, fish, beets, cabbage, cheese, Licorice-like flavor Garlic-(buds or powder) Salads, meat, poultry, fish, bread, butter, vegetables, potatoes.Do not  use garlic salt Ginger-Fruit, vegetables, baked goods, meat, fish, poultry Horseradish Root-Meet, vegetables, butter Lemon Juice or Extract-Vegetables, fruit, tea, baked goods, fish salads Mace-Baked goods fruit, vegetables, fish, poultry (taste like nutmeg) Maple Extract-Syrups Marjoram-Meat, chicken, fish, vegetables, breads, green salads (taste like Sage) Mint-Tea, lamb, sherbet, vegetables, desserts, carrots, cabbage Mustard, Dry or Seed-Cheese, eggs, meats, vegetables, poultry Nutmeg-Baked goods, fruit, chicken, eggs, vegetables, desserts Onion Powder-Meat, fish, poultry, vegetables, cheese, eggs, bread, rice salads (Do not use   Onion salt) Orange Extract-Desserts, baked goods Oregano-Pasta, eggs, cheese, onions, pork, lamb, fish, chicken, vegetables, green salads Paprika-Meat, fish, poultry, eggs, cheese, vegetables Parsley Flakes-Butter, vegetables, meat fish, poultry, eggs, bread, salads (certain forms may   Contain sodium Pepper-Meat fish, poultry, vegetables, eggs Peppermint Extract-Desserts, baked goods Poppy Seed-Eggs, bread, cheese, fruit dressings, baked goods, noodles, vegetables, cottage  [employer], poultry, meat, fish, cauliflower, turnips,eggs bread Saffron-Rice,  bread, veal, chicken, fish, eggs Sage-Meat, fish, poultry, onions, eggplant, tomateos, pork, stews Savory-Eggs, salads, poultry, meat, rice, vegetables, soups, pork Tarragon-Meat, poultry, fish, eggs, butter, vegetables (licorice-like flavor)  Thyme-Meat, poultry, fish, eggs, vegetables, (clover-like flavor), sauces, soups Tumeric-Salads, butter, eggs, fish, rice, vegetables (saffron-like flavor) Vanilla Extract-Baked goods, candy Vinegar-Salads, vegetables, meat marinades Walnut Extract-baked goods, candy   2. Choose your Foods Wisely   The following is a list of foods to avoid which are high in sodium:  Meats-Avoid all smoked, canned, salt cured, dried and kosher meat and fish as well as Anchovies   [employer] meats:Bologna, Liverwurst, Pastrami Canned meat or fish  Marinated herring Caviar    Pepperoni Corned Beef   Pizza Dried chipped beef  Salami Frozen breaded fish or meat Salt pork Frankfurters or hot dogs  Sardines Gefilte fish   Sausage Ham (boiled ham, Proscuitto Smoked butt    spiced ham)   Spam      TV Dinners Vegetables Canned vegetables (Regular) Relish Canned mushrooms  Sauerkraut Olives    Tomato juice Pickles  Bakery and Dessert Products Canned puddings  Cream pies Cheesecake   Decorated cakes Cookies  Beverages/Juices Tomato juice, regular  Gatorade   V-8 vegetable juice, regular  Breads and Cereals Biscuit mixes   Salted potato chips, corn chips, pretzels Bread stuffing mixes  Salted crackers and rolls Pancake and waffle mixes Self-rising flour  Seasonings Accent    Meat sauces Barbecue sauce  Meat tenderizer Catsup    Monosodium glutamate (MSG) Celery salt   Onion salt Chili sauce   Prepared mustard Garlic salt   Salt, seasoned salt, sea salt Gravy mixes   Soy sauce Horseradish   Steak sauce Ketchup   Tartar sauce  Lite salt    Teriyaki sauce Marinade mixes   Worcestershire sauce  Others Baking powder   Cocoa and cocoa  mixes Baking soda   Commercial casserole mixes Candy-caramels, chocolate  Dehydrated soups    Bars, fudge,nougats  Instant rice and pasta mixes Canned broth or soup  Maraschino cherries Cheese, aged and processed cheese and cheese spreads  Learning Assessment Quiz  Indicated T (for True) or F (for False) for each of the following statements:  _____ Fresh fruits and vegetables and unprocessed grains are generally low in sodium _____ Water may contain a considerable amount of sodium, depending on the source _____ You can always tell if a food is high in sodium by tasting it _____ Certain laxatives my be high in sodium and should be avoided unless prescribed   by a physician or pharmacist _____ Salt substitutes may be used freely by anyone on a sodium restricted diet _____ Sodium is present in table salt, food additives and as a natural component of   most foods _____ Table salt is approximately 90% sodium _____ Limiting sodium intake may help prevent excess fluid accumulation in the body _____ On a sodium-restricted diet, seasonings such as bouillon soy sauce, and    cooking wine should be used in place of table salt _____ On an ingredient list, a product which lists monosodium glutamate as the first   ingredient is an appropriate food to include on a low sodium diet  Circle the best answer(s) to the following statements (Hint: there may be more than one correct answer)  11. On a low-sodium diet, some acceptable snack items are:    A. Olives  F. Bean dip   K. Grapefruit juice    B. Salted Pretzels G. Commercial Popcorn   L. Canned peaches    C. Carrot Sticks  H. Bouillon   M. Unsalted nuts   D. [place] fries  I. Peanut butter crackers N. Salami   E. Sweet pickles J. Tomato Juice   O. Pizza  12.  Seasonings that may be used freely on a reduced - sodium diet include   A. Lemon wedges F.Monosodium glutamate K. Celery seed    B.Soysauce   G. Pepper   L. Mustard powder   C. Sea  salt  H. Cooking wine  M. Onion flakes   D. Vinegar  E. Prepared horseradish N. Salsa   E. Sage   J. Worcestershire sauce  O. Chutney

## 2021-05-21 NOTE — Progress Notes (Signed)
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  Abdominal imaging every 6 months for [facility] screening.  Hepatic encephalopathy: Clinically controlled on lactulose and Xifaximin.  Ascites and peripheral edema: Management is becoming increasing more difficult. Continue 2000 mg sodium restricted diet, titrate diuretics as tolerated. Labs today to follow his renal function.   Recent hospitalization for hyponatremia: Labs today for close monitoring given his recent increase in diuretics.   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 No obvious mass. High risk for cholecystectomy at this time. Discussed potential symptoms of symptomatic gallbladder disease.    PLAN: - CMP, PT/INR (he will have performed during visit with [facility] tomorrow) - Urine sodium and potassium (he will have performed during visit with [facility] tomorrow) - Continue 2000 mg sodium restricted diet - Continue diuretics including Aldactone 100 mg daily and Lasix 20 mg daily - Continue lactulose and Xifaxan - Follow-up with [facility] tomorrow for liver transplant evaluation - Follow-up with me or  Colleen in 4-8 weeks, earlier if needed - MRI due 06/2021 for hepatocellular carcinoma - He was counseled to continue to abstain from all alcohol - I've asked to bring his medications with him to every appointment  Please see the "Patient Instructions" section for addition details about the plan.  HPI: Ian Hall is a 55 y.o. male who returns in follow-up. Last seen in the office by Amy [facility] 05/11/21. Interval history obtained through the patient and review of his electronic health record.   Ongoing trouble with ascites and lower extremity edema despite lasix 20 mg QD and spironolactone 100 mg daily. Required 3.4L paracentesis 05/12/21.   Encephalopathy has been well controlled on lactulose and Xifaxan.  Overall doing "okay." Notes that he has severe fatigue.   Has upcoming follow-up with [facility] to initial transplant evaluation.  He remains very motivated to live and wants to get a transplant.  He reports no alcohol use since December 2021.   His most recent labs from 05/15/2021 show a sodium 135, creatinine 0.88, total bilirubin 9.0, AST 141, ALT 31, alk phos 340, albumin 2.8, hemoglobin 12.4, platelets 64 Most recent INR from 04/21/2021 showed an INR of 1.5 Most recent AFP from 03/31/2021 was 5.5   Recent evaluation includes: - CT abd/pelvis with contrast 12/24/20: cirrhosis with perihepatic ascites and splenomegaly, patent portal vein, calcified gallbladder wall versus large stone in the GB (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), diverticulosis, colonic wall thickening, anasarca - Started steroids for possible alcoholic hepatitis - Abdominal ultrasound 12/26/20: calcified gallbladder wall versus large gallstone - Not enough ascites for paracentesis 12/27/20 - Stool + for Enteroaggregative E coli, treated with Cipro - EGD 12/30/20: food in the stomach, no varices or portal hypertensive gastropathy - Colonoscopy  12/30/20: patchy colitis, diverticulosis - MRI 2/9/222:  cirrhosis, splenomegaly, small volume ascites, small gastroesophageal and paraumbilical varices, mild porta hepatis adenopathy, cholelithiasis, colonic diverticulosis - Liver biopsy 01/09/21: Cirrhosis of uncertain etiology, marked cholestasis, no iron overload - suspected burnt out NASH or ASH, possible overlapping drug injury - Venogram 01/09/21: Portosystemic gradient of 61m Hg - Head CT 02/24/21 - no acute abnormality - Limited RUQ ultrasound 02/24/21: Limited evaluation. Echogenic liver. - CT Angio Chest for PE 04/10/21: Cirrhosis and splenomegaly, no evidence of PE, porcelain gallbladder     Past Medical History:  Diagnosis Date   Anxiety    Cirrhosis ([place]    COVID-19     Past Surgical History:  Procedure Laterality Date   ANKLE SURGERY Right    IR PARACENTESIS  01/07/2021   IR PARACENTESIS  01/30/2021   IR PARACENTESIS  05/12/2021   IR TRANSCATHETER BX  01/09/2021   IR [place] VASC ACCESS RIGHT  01/09/2021   IR VENOGRAM HEPATIC W HEMODYNAMIC EVALUATION  01/09/2021    Current Outpatient Medications  Medication Sig Dispense Refill   albuterol (VENTOLIN HFA) 108 (90 Base) MCG/ACT inhaler INHALE 2 PUFFS INTO THE LUNGS EVERY 6 HOURS AS NEEDED FOR WHEEZING OR SHORTNESS OF BREATH 6.7 g 3   dicyclomine (BENTYL) 10 MG capsule Take 1 capsule (10 mg total) by mouth 4 (four) times daily -  before meals and at bedtime. 1537capsule 2   folic acid (FOLVITE) 1 MG tablet Take 1 tablet (1 mg total) by mouth daily. 30 tablet 1   furosemide (LASIX) 20 MG tablet Take 1 tablet (20 mg total) by mouth daily. 30 tablet 6   hydrOXYzine (ATARAX/VISTARIL) 10 MG tablet TAKE 1/2 TO 1 TABLET(5 TO 10 MG) BY MOUTH AT BEDTIME AS NEEDED FOR ITCHING 90 tablet 0   lactulose (CHRONULAC) 10 GM/15ML solution Take 45 mLs (30 g total) by mouth 3 (three) times daily. 12150 mL 0   meclizine (ANTIVERT) 12.5 MG tablet Take 1 tablet (12.5 mg total) by mouth daily as needed for dizziness. 30 tablet 0   pantoprazole (PROTONIX) 40  MG tablet Take 1 tablet (40 mg total) by mouth 2 (two) times daily. 60 tablet 0   rifaximin (XIFAXAN) 550 MG TABS tablet Take 1 tablet (550 mg total) by mouth 2 (two) times daily. 180 tablet 0   spironolactone (ALDACTONE) 100 MG tablet Take 1 tablet (100 mg total) by mouth daily. 30 tablet 6   Current Facility-Administered Medications  Medication Dose Route Frequency Provider Last Rate Last Admin   albumin human 25 % solution 75 g  75 g Intravenous Once Esterwood, Amy S, PA-C        Allergies as of 05/21/2021 - Review Complete 05/21/2021  Allergen Reaction Noted   Hydrocodone Itching and Other (See Comments) 08/26/2015      Physical Exam: General:   Alert, in NAD. Scleral icterus Heart:  Regular rate and rhythm; no murmurs Pulm: Clear anteriorly; no wheezing Abdomen:  Protuberent but soft. Bruise over the right flank at th sight of prior paracentesis. Nontender. Nondistended. Normal bowel sounds. No rebound or guarding. No fluid wave.  No hepatosplenomegaly. LAD: No inguinal or umbilical LAD Extremities:  1-2+ LE edema Neurologic:  Alert and  oriented x4;  grossly normal neurologically; no asterixis or clonus. Skin: No jaundice. Mild palmar erythema. Few spider angioma on the chest wall.  No Terry's nails. Psych:  Alert and cooperative. Normal mood and affect.     Tyrrell Stephens L. BTarri Glenn MD, MPH  05/21/2021, 8:38 AM

## 2021-05-21 NOTE — Progress Notes (Signed)
Aleatha Borer, LPN  Jennette Dubin, April H RADIOLOGY [facility] REQUEST   [facility]  Phone: [phone]  Fax: [phone]   Imaging Ordered: MRI/MRCP   Diagnosis: Cirrhosis   Ordering Provider: Dr. Tarri Glenn   Is a Prior Authorization needed? We are in the process of obtaining it now   Is the patient Diabetic? No   Does the patient have Hypertension? Yes   Does the patient have any implanted devices or hardware? No   Date of last BUN/Creat, if needed? N/A   Patient Weight? 233#   Is the patient able to get on the table? Yes   Has the patient been diagnosed with COVID? No   Is the patient waiting on COVID testing results? No   Thank you for your assistance!  [facility] Team

## 2021-05-25 ENCOUNTER — Other Ambulatory Visit (HOSPITAL_COMMUNITY): Payer: Self-pay | Admitting: Nurse Practitioner

## 2021-05-25 ENCOUNTER — Other Ambulatory Visit: Payer: Self-pay | Admitting: Nurse Practitioner

## 2021-05-25 DIAGNOSIS — R188 Other ascites: Secondary | ICD-10-CM

## 2021-05-27 ENCOUNTER — Ambulatory Visit (HOSPITAL_COMMUNITY)
Admission: RE | Admit: 2021-05-27 | Discharge: 2021-05-27 | Disposition: A | Discharge status: Home / Self Care | Payer: No Typology Code available for payment source | Source: Ambulatory Visit | Attending: Nurse Practitioner | Admitting: Nurse Practitioner

## 2021-05-27 ENCOUNTER — Other Ambulatory Visit: Payer: Self-pay

## 2021-05-27 DIAGNOSIS — R188 Other ascites: Secondary | ICD-10-CM | POA: Diagnosis not present

## 2021-05-27 HISTORY — PX: IR PARACENTESIS: IMG2679

## 2021-05-27 MED ORDER — LIDOCAINE HCL 1 % IJ SOLN
INTRAMUSCULAR | Status: AC
  Filled 2021-05-27: qty 20

## 2021-05-27 MED ORDER — LIDOCAINE HCL (PF) 1 % IJ SOLN
INTRAMUSCULAR | Status: DC | PRN
  Administered 2021-05-27: 30 mL

## 2021-05-27 NOTE — Procedures (Signed)
PROCEDURE SUMMARY:  Successful image-guided paracentesis from the right lower abdomen.  Yielded 2 liters of clear yellow fluid.  No immediate complications.  EBL = trace. Patient tolerated well.   Specimen not sent for labs.  Please see imaging section of Epic for full dictation.   Cheridan Kibler H Ranita Stjulien PA-C 05/27/2021 1:22 PM

## 2021-05-28 ENCOUNTER — Telehealth: Payer: Self-pay | Admitting: Registered Nurse

## 2021-05-28 NOTE — Telephone Encounter (Signed)
..  Type of form received:FMLA  Additional comments:   Received JI:ZXYOF Form should be Faxed to: Form should be mailed to:    Is patient requesting call for pickup: yes   Form placed:  Up front in Dr. Virgil Benedict bin  Attach charge sheet.  Provider will determine charge.  Individual made aware of 3-5 business day turn around (Y/N)?

## 2021-05-29 ENCOUNTER — Other Ambulatory Visit: Payer: Self-pay

## 2021-05-29 ENCOUNTER — Other Ambulatory Visit (HOSPITAL_COMMUNITY): Payer: Self-pay | Admitting: Gastroenterology

## 2021-05-29 ENCOUNTER — Ambulatory Visit (HOSPITAL_COMMUNITY)
Admission: RE | Admit: 2021-05-29 | Discharge: 2021-05-29 | Disposition: A | Discharge status: Home / Self Care | Payer: No Typology Code available for payment source | Source: Ambulatory Visit | Attending: Gastroenterology | Admitting: Gastroenterology

## 2021-05-29 DIAGNOSIS — K7460 Unspecified cirrhosis of liver: Secondary | ICD-10-CM | POA: Diagnosis present

## 2021-05-29 DIAGNOSIS — K7031 Alcoholic cirrhosis of liver with ascites: Secondary | ICD-10-CM | POA: Diagnosis present

## 2021-05-29 DIAGNOSIS — R109 Unspecified abdominal pain: Secondary | ICD-10-CM

## 2021-05-29 DIAGNOSIS — K7682 Hepatic encephalopathy: Secondary | ICD-10-CM

## 2021-05-29 DIAGNOSIS — K766 Portal hypertension: Secondary | ICD-10-CM

## 2021-05-29 DIAGNOSIS — K7290 Hepatic failure, unspecified without coma: Secondary | ICD-10-CM

## 2021-05-29 DIAGNOSIS — K760 Fatty (change of) liver, not elsewhere classified: Secondary | ICD-10-CM | POA: Diagnosis present

## 2021-05-29 MED ORDER — GADOBUTROL 1 MMOL/ML IV SOLN
10.0000 mL | Freq: Once | INTRAVENOUS | Status: AC | PRN
  Administered 2021-05-29: 10 mL via INTRAVENOUS

## 2021-06-03 ENCOUNTER — Ambulatory Visit (INDEPENDENT_AMBULATORY_CARE_PROVIDER_SITE_OTHER): Payer: No Typology Code available for payment source | Admitting: Pulmonary Disease

## 2021-06-03 ENCOUNTER — Other Ambulatory Visit: Payer: Self-pay

## 2021-06-03 ENCOUNTER — Encounter: Payer: Self-pay | Admitting: Pulmonary Disease

## 2021-06-03 VITALS — BP 112/68 | HR 83 | Ht 66.0 in | Wt 219.0 lb

## 2021-06-03 DIAGNOSIS — R0683 Snoring: Secondary | ICD-10-CM | POA: Diagnosis not present

## 2021-06-03 DIAGNOSIS — R0602 Shortness of breath: Secondary | ICD-10-CM | POA: Diagnosis not present

## 2021-06-03 MED ORDER — FLUTICASONE FUROATE-VILANTEROL 100-25 MCG/INH IN AEPB: 1.0000 | INHALATION_SPRAY | Freq: Every day | RESPIRATORY_TRACT | 6 refills | Status: AC

## 2021-06-03 MED ORDER — TRELEGY ELLIPTA 100-62.5-25 MCG/INH IN AEPB: 1.0000 | INHALATION_SPRAY | Freq: Every day | RESPIRATORY_TRACT | 0 refills | Status: DC

## 2021-06-03 NOTE — Telephone Encounter (Signed)
Called patient to update on paperwork. Per NP Orland Mustard, he is working on it and will be completed tonight or early morning. Patient is aware.

## 2021-06-03 NOTE — Progress Notes (Signed)
Synopsis: Referred in July 2022 for Dyspnea on Exertion by Maximiano Coss, NP  Subjective:   PATIENT ID: Ian Hall GENDER: male DOB: [birth year], MRN: [id number]   HPI  Chief Complaint  Patient presents with   Consult    Dyspnea on exertion when walking, feels week since January 2022. Covid in January 2022.    Ian Hall is a 55 year old male, former smoker with alcoholic cirrhosis who is referred to pulmonary clinic for shortness of breath on exertion.   He has noticed progressive shortness of breath since January. He reports he can walk maybe half a block before getting short of breath where he needs a break. He has some wheezing when he lays down. He has been provided with an albuterol inhaler which does help with his shortness of breath at times. He does report some cough with clear sputum with the dyspnea and wheezing. He denies any respiratory issues in childhood or his younger years.    He has received multiple paracenteses for ascites but denies improvement in his breathing after each procedure.   He quit smoking in 2012 and smoked 0.25 pack per day for roughly 30 years.   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 He only wore a respirator or N95 mask around [place] as he had an allergic reaction to this material, otherwise he did not wear protective equipment on a regular basis around the various materials or chemicals.  He does report significant snoring at night. He does complain of nocturia.    Past Medical History:  Diagnosis Date   Anxiety    Cirrhosis ([place])    COVID-69      Family History  Problem Relation Age of Onset   Diabetes Mother    Memory loss Mother    Diabetes Father    Diabetes Cousin    Colon cancer Neg Hx      Social History   Socioeconomic History   Marital status: Married    Spouse name: Not on file   Number of children: 0   Years of education: Not on file   Highest education level: Not on file  Occupational History    Occupation: IT sales professional: [employer]  Tobacco Use   Smoking status: Former    Packs/day: 0.50    Pack years: 0.00    Types: Cigarettes    Start date: 1981    Quit date: 12/16/2002    Years since quitting: 18.4   Smokeless tobacco: Never  Vaping Use   Vaping Use: Never used  Substance and Sexual Activity   Alcohol use: Not Currently    Comment: none for 3-4 years (stated 12/16/2020)   Drug use: No   Sexual activity: Yes    Birth control/protection: None  Other Topics Concern   Not on file  Social History Narrative   Not on file   Social Determinants of Health   Financial Resource Strain: Not on file  Food Insecurity: Not on file  Transportation Needs: Not on file  Physical Activity: Not on file  Stress: Not on file  Social Connections: Not on file  Intimate Partner Violence: Not on file     Allergies  Allergen Reactions   Hydrocodone Itching and Other (See Comments)    Noted with hydrocodone cough syrup. Itching only, no hives.  02/03/16: denied itching with recent hydrocodone cough syrup. Only one episode of itching previously.      Outpatient Medications Prior to Visit  Medication Sig Dispense Refill   albuterol (VENTOLIN HFA) 108 (90 Base) MCG/ACT inhaler INHALE 2 PUFFS INTO THE LUNGS EVERY 6 HOURS AS NEEDED FOR WHEEZING OR SHORTNESS OF BREATH 6.7 g 3   dicyclomine (BENTYL) 10 MG capsule Take 1 capsule (10 mg total) by mouth 4 (four) times daily -  before meals and at bedtime. 562 capsule 2   folic acid (FOLVITE) 1 MG tablet Take 1 tablet (1 mg total) by mouth daily. 30 tablet 1   furosemide (LASIX) 20 MG tablet Take 1 tablet (20 mg total) by mouth daily. 30 tablet 6   hydrOXYzine (ATARAX/VISTARIL) 10 MG tablet TAKE 1/2 TO 1 TABLET(5 TO 10 MG) BY MOUTH AT BEDTIME AS NEEDED FOR ITCHING 90 tablet 0   lactulose (CHRONULAC) 10 GM/15ML solution Take 45 mLs (30 g total) by mouth 3 (three) times daily. 12150 mL 0   meclizine (ANTIVERT) 12.5 MG tablet  Take 1 tablet (12.5 mg total) by mouth daily as needed for dizziness. 30 tablet 0   pantoprazole (PROTONIX) 40 MG tablet Take 1 tablet (40 mg total) by mouth 2 (two) times daily. 60 tablet 0   rifaximin (XIFAXAN) 550 MG TABS tablet Take 1 tablet (550 mg total) by mouth 2 (two) times daily. 180 tablet 0   spironolactone (ALDACTONE) 100 MG tablet Take 1 tablet (100 mg total) by mouth daily. 30 tablet 6   albumin human 25 % solution 75 g      No facility-administered medications prior to visit.    Review of Systems  Constitutional:  Negative for chills, fever, malaise/fatigue and weight loss.  HENT:  Negative for congestion, sinus pain and sore throat.   Eyes: Negative.   Respiratory:  Positive for shortness of breath and wheezing. Negative for cough, hemoptysis and sputum production.   Cardiovascular:  Negative for chest pain, palpitations, orthopnea, claudication and leg swelling.  Gastrointestinal:  Negative for abdominal pain, heartburn, nausea and vomiting.  Genitourinary: Negative.   Musculoskeletal:  Negative for joint pain and myalgias.  Skin:  Negative for rash.  Neurological:  Negative for weakness.  Endo/Heme/Allergies: Negative.   Psychiatric/Behavioral: Negative.       Objective:   Vitals:   06/03/21 0911  BP: 112/68  Pulse: 83  Weight: 219 lb (99.3 kg)  Height: 5' 6"  (1.676 m)  PF: 100 L/min     Physical Exam Constitutional:      General: He is not in acute distress. HENT:     Head: Normocephalic and atraumatic.     Nose: Nose normal.     Mouth/Throat:     Mouth: Mucous membranes are moist.     Pharynx: Oropharynx is clear.  Eyes:     General: Scleral icterus present.     Extraocular Movements: Extraocular movements intact.     Conjunctiva/sclera: Conjunctivae normal.     Pupils: Pupils are equal, round, and reactive to light.  Cardiovascular:     Rate and Rhythm: Normal rate and regular rhythm.     Pulses: Normal pulses.     Heart sounds: Normal heart  sounds. No murmur heard. Abdominal:     General: Bowel sounds are normal. There is distension.     Palpations: Abdomen is soft.     Tenderness: There is no abdominal tenderness.  Musculoskeletal:     Right lower leg: No edema.     Left lower leg: No edema.  Lymphadenopathy:     Cervical: No cervical adenopathy.  Skin:    General: Skin is  warm and dry.  Neurological:     General: No focal deficit present.     Mental Status: He is alert.  Psychiatric:        Mood and Affect: Mood normal.        Behavior: Behavior normal.        Thought Content: Thought content normal.        Judgment: Judgment normal.   CBC    Component Value Date/Time   WBC 4.4 05/15/2021 1001   RBC 3.71 (L) 05/15/2021 1001   HGB 12.4 (L) 05/15/2021 1001   HGB 13.0 02/04/2021 1557   HCT 35.9 (L) 05/15/2021 1001   HCT 34.5 (L) 02/04/2021 1557   PLT 64.0 (L) 05/15/2021 1001   PLT 93 (LL) 02/04/2021 1557   MCV 96.9 05/15/2021 1001   MCV 91 02/04/2021 1557   MCH 33.7 (H) 04/24/2021 1444   MCHC 34.5 05/15/2021 1001   RDW 17.0 (H) 05/15/2021 1001   RDW 14.3 02/04/2021 1557   LYMPHSABS 0.9 05/15/2021 1001   LYMPHSABS 1.5 01/14/2021 1643   MONOABS 0.5 05/15/2021 1001   EOSABS 0.1 05/15/2021 1001   EOSABS 0.1 01/14/2021 1643   BASOSABS 0.0 05/15/2021 1001   BASOSABS 0.1 01/14/2021 1643   BMP Latest Ref Rng & Units 05/15/2021 05/11/2021 04/30/2021  Glucose 70 - 99 mg/dL 100(H) 95 135(H)  BUN 6 - 23 mg/dL 11 11 17   Creatinine 0.40 - 1.50 mg/dL 0.88 0.87 0.92  BUN/Creat Ratio 6 - 22 (calc) - - -  Sodium 135 - 145 mEq/L 135 135 131(L)  Potassium 3.5 - 5.1 mEq/L 3.7 4.1 4.1  Chloride 96 - 112 mEq/L 106 106 104  CO2 19 - 32 mEq/L 22 22 20   Calcium 8.4 - 10.5 mg/dL 8.1(L) 8.3(L) 8.0(L)   Chest imaging: CTA Chest 04/10/21 No evidence of pulmonary embolus. 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 Changes of cirrhosis with splenomegaly.  PFT: No flowsheet data found.  Echo 02/24/21: LV EF  60-65%. LV has normal function. Mild concentric LVH. Grade I diastolic dysfunction. RV systolic function is normal and RV is normal size. Trivial mitral regurgitation.     Assessment & Plan:   Shortness of breath - Plan: Pulmonary Function Test, fluticasone furoate-vilanterol (BREO ELLIPTA) 100-25 MCG/INH AEPB  Snoring - Plan: Home sleep test  Discussion: Ian Hall is a 55 year old male, former smoker with alcoholic cirrhosis who is referred to pulmonary clinic for shortness of breath on exertion.   Recent CT chest and echo reviewed. Simple walk in clinic does not show a decline in SpO2 with ambulation.   His shortness of breath may be in part to anatomical compression due to ascites but he does not report relief after each procedure. Given his response to albuterol and intermittent wheezing on exam there is concern for reactive airways disease or asthma. We will start him on breo ellipta inhaler and monitor for symptom improvement. His CT scan does not show concern for parenchymal lung disease and simple walk does not indicate exertional hypoxemia due to shunting.   We will check pulmonary function tests in the near future and also schedule him for a home sleep study to evaluate for sleep disordered breathing given his history of snoring.   Ian Jackson, MD [facility] Office: [phone]    Current Outpatient Medications:    albuterol (VENTOLIN HFA) 108 (90 Base) MCG/ACT inhaler, INHALE 2 PUFFS INTO THE LUNGS EVERY 6 HOURS AS NEEDED FOR WHEEZING OR SHORTNESS OF  BREATH, Disp: 6.7 g, Rfl: 3   dicyclomine (BENTYL) 10 MG capsule, Take 1 capsule (10 mg total) by mouth 4 (four) times daily -  before meals and at bedtime., Disp: 120 capsule, Rfl: 2   fluticasone furoate-vilanterol (BREO ELLIPTA) 100-25 MCG/INH AEPB, Inhale 1 puff into the lungs daily., Disp: 28 each, Rfl: 6   Fluticasone-Umeclidin-Vilant (TRELEGY ELLIPTA) 100-62.5-25 MCG/INH AEPB, Inhale 1 puff into  the lungs daily. Rinse mouth after, Disp: 1 each, Rfl: 0   folic acid (FOLVITE) 1 MG tablet, Take 1 tablet (1 mg total) by mouth daily., Disp: 30 tablet, Rfl: 1   furosemide (LASIX) 20 MG tablet, Take 1 tablet (20 mg total) by mouth daily., Disp: 30 tablet, Rfl: 6   hydrOXYzine (ATARAX/VISTARIL) 10 MG tablet, TAKE 1/2 TO 1 TABLET(5 TO 10 MG) BY MOUTH AT BEDTIME AS NEEDED FOR ITCHING, Disp: 90 tablet, Rfl: 0   lactulose (CHRONULAC) 10 GM/15ML solution, Take 45 mLs (30 g total) by mouth 3 (three) times daily., Disp: 12150 mL, Rfl: 0   meclizine (ANTIVERT) 12.5 MG tablet, Take 1 tablet (12.5 mg total) by mouth daily as needed for dizziness., Disp: 30 tablet, Rfl: 0   pantoprazole (PROTONIX) 40 MG tablet, Take 1 tablet (40 mg total) by mouth 2 (two) times daily., Disp: 60 tablet, Rfl: 0   rifaximin (XIFAXAN) 550 MG TABS tablet, Take 1 tablet (550 mg total) by mouth 2 (two) times daily., Disp: 180 tablet, Rfl: 0   spironolactone (ALDACTONE) 100 MG tablet, Take 1 tablet (100 mg total) by mouth daily., Disp: 30 tablet, Rfl: 6

## 2021-06-03 NOTE — Telephone Encounter (Signed)
Pt is calling back about [employer] forms his work has give him a deadline that passed 3 days ago and the company is waiting on paperwork?  Call back [phone]

## 2021-06-03 NOTE — Patient Instructions (Signed)
Try trelegy ellipta 1 puff daily until sample is finished and I have sent in a prescription for breo ellipta 1 puff daily to use after the sample.  - Rinse mouth out after each use.  We will see if the inhalers help your shortness of breath at all.   We will schedule you for pulmonary function tests in the near future  We will schedule you for a home sleep study

## 2021-06-10 ENCOUNTER — Telehealth: Payer: Self-pay | Admitting: Gastroenterology

## 2021-06-10 ENCOUNTER — Other Ambulatory Visit: Payer: Self-pay

## 2021-06-10 MED ORDER — MECLIZINE HCL 12.5 MG PO TABS: 12.5000 mg | ORAL_TABLET | Freq: Every day | ORAL | 1 refills | Status: DC | PRN

## 2021-06-10 NOTE — Telephone Encounter (Signed)
Outpatient Medication Detail   Disp Refills Start End   meclizine (ANTIVERT) 12.5 MG tablet 30 tablet 1 06/10/2021    Sig - Route: Take 1 tablet (12.5 mg total) by mouth daily as needed for dizziness. - Oral   Sent to pharmacy as: meclizine (ANTIVERT) 12.5 MG tablet   E-Prescribing Status: Receipt confirmed by pharmacy (06/10/2021  3:02 PM EDT)

## 2021-06-10 NOTE — Telephone Encounter (Signed)
Inbound call from patient requesting refill for Meclizine 12.5 to [facility]

## 2021-06-12 ENCOUNTER — Encounter: Payer: Self-pay | Admitting: *Deleted

## 2021-06-12 ENCOUNTER — Other Ambulatory Visit: Payer: Self-pay

## 2021-06-12 ENCOUNTER — Ambulatory Visit (INDEPENDENT_AMBULATORY_CARE_PROVIDER_SITE_OTHER): Payer: No Typology Code available for payment source | Admitting: Pulmonary Disease

## 2021-06-12 DIAGNOSIS — R0602 Shortness of breath: Secondary | ICD-10-CM | POA: Diagnosis not present

## 2021-06-12 LAB — PULMONARY FUNCTION TEST
DL/VA % pred: 106 %
DL/VA: 4.7 ml/min/mmHg/L
DLCO cor % pred: 99 %
DLCO cor: 25.16 ml/min/mmHg
DLCO unc % pred: 92 %
DLCO unc: 23.45 ml/min/mmHg
FEF 25-75 Post: 3.77 L/sec
FEF 25-75 Pre: 4.41 L/sec
FEF2575-%Change-Post: -14 %
FEF2575-%Pred-Post: 129 %
FEF2575-%Pred-Pre: 151 %
FEV1-%Change-Post: -3 %
FEV1-%Pred-Post: 92 %
FEV1-%Pred-Pre: 95 %
FEV1-Post: 3.07 L
FEV1-Pre: 3.18 L
FEV1FVC-%Change-Post: 0 %
FEV1FVC-%Pred-Pre: 112 %
FEV6-%Change-Post: -3 %
FEV6-%Pred-Post: 86 %
FEV6-%Pred-Pre: 89 %
FEV6-Post: 3.54 L
FEV6-Pre: 3.67 L
FEV6FVC-%Pred-Post: 104 %
FEV6FVC-%Pred-Pre: 104 %
FVC-%Change-Post: -3 %
FVC-%Pred-Post: 82 %
FVC-%Pred-Pre: 85 %
FVC-Post: 3.54 L
FVC-Pre: 3.67 L
Post FEV1/FVC ratio: 87 %
Post FEV6/FVC ratio: 100 %
Pre FEV1/FVC ratio: 87 %
Pre FEV6/FVC Ratio: 100 %

## 2021-06-12 NOTE — Progress Notes (Addendum)
NEUROLOGY CONSULTATION NOTE  Ian Hall MRN: [id number] DOB: 11/26/1966  Referring provider: Maximiano Coss, NP Primary care provider: Maximiano Coss, NP  Reason for consult:  dizziness  Assessment/Plan:   Dizziness - vague semiology - will evaluate for vertebrobasilar insufficiency  MRI/MRA of brain Further recommendations pending results.  ADDENDUM:  MRI/MRA of brain on 07/04/2021 reviewed.  No findings to explain dizziness.  I have no neurologic explanation for dizziness and therefore should follow up with PCP.  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  Follow up with me not indicated.   Subjective:  Ian Hall is a 55 year old right-handed male with alcoholic cirrhosis, anxiety and history of DVT who presents for dizziness.  History supplemented by referring provider's notes.  He has complex medical history.  He was diagnosed with alcoholic cirrhosis in January and was hospitalized in late March for hepatic encephalopathy.  CT head performed at that time was personally reviewed and was unremarkable.  Since starting treatment for cirrhosis, he has felt intermittent dizziness.  It is not a spinning sensation.  He feels like he is buzzed after a drink.  It occurs when he is exercising or getting out of a car.  No double vision.  No prior history of dizziness or vertigo.  He also has history of chronic hyponatremia and was hospitalized in May for fatigue and weakness with Na level of 112.  He also has dyspnea on exertion for which he has been evaluated by pulmonology who suspected anatomical compression due to ascites as well as possible reactive airway disease or asthma.  PAST MEDICAL HISTORY: Past Medical History:  Diagnosis Date   Anxiety    Cirrhosis ([place])    COVID-19     PAST SURGICAL HISTORY: Past Surgical History:  Procedure Laterality Date    ANKLE SURGERY Right    IR PARACENTESIS  01/07/2021   IR PARACENTESIS  01/30/2021   IR PARACENTESIS  05/12/2021   IR PARACENTESIS  05/27/2021   IR TRANSCATHETER BX  01/09/2021   IR US GUIDE VASC ACCESS RIGHT  01/09/2021   IR VENOGRAM HEPATIC W HEMODYNAMIC EVALUATION  01/09/2021    MEDICATIONS: Current Outpatient Medications on File Prior to Visit  Medication Sig Dispense Refill   albuterol (VENTOLIN HFA) 108 (90 Base) MCG/ACT inhaler INHALE 2 PUFFS INTO THE LUNGS EVERY 6 HOURS AS NEEDED FOR WHEEZING OR SHORTNESS OF BREATH 6.7 g 3   dicyclomine (BENTYL) 10 MG capsule Take 1 capsule (10 mg total) by mouth 4 (four) times daily -  before meals and at bedtime. 120 capsule 2   fluticasone furoate-vilanterol (BREO ELLIPTA) 100-25 MCG/INH AEPB Inhale 1 puff into the lungs daily. 28 each 6   Fluticasone-Umeclidin-Vilant (TRELEGY ELLIPTA) 100-62.5-25 MCG/INH AEPB Inhale 1 puff into the lungs daily. Rinse mouth after 1 each 0   folic acid (FOLVITE) 1 MG tablet Take 1 tablet (1 mg total) by mouth daily. 30 tablet 1   furosemide (LASIX) 20 MG tablet Take 1 tablet (20 mg total) by mouth daily. 30 tablet 6   hydrOXYzine (ATARAX/VISTARIL) 10 MG tablet TAKE 1/2 TO 1 TABLET(5 TO 10 MG) BY MOUTH AT BEDTIME AS NEEDED FOR ITCHING 90 tablet 0   lactulose (CHRONULAC) 10 GM/15ML solution Take 45 mLs (30 g total) by mouth 3 (three) times daily. 12150 mL 0   meclizine (ANTIVERT) 12.5 MG tablet Take 1  tablet (12.5 mg total) by mouth daily as needed for dizziness. 30 tablet 1   pantoprazole (PROTONIX) 40 MG tablet Take 1 tablet (40 mg total) by mouth 2 (two) times daily. 60 tablet 0   rifaximin (XIFAXAN) 550 MG TABS tablet Take 1 tablet (550 mg total) by mouth 2 (two) times daily. 180 tablet 0   spironolactone (ALDACTONE) 100 MG tablet Take 1 tablet (100 mg total) by mouth daily. 30 tablet 6   No current facility-administered medications on file prior to visit.    ALLERGIES: Allergies  Allergen Reactions   Hydrocodone  Itching and Other (See Comments)    Noted with hydrocodone cough syrup. Itching only, no hives.  02/03/16: denied itching with recent hydrocodone cough syrup. Only one episode of itching previously.     FAMILY HISTORY: Family History  Problem Relation Age of Onset   Diabetes Mother    Memory loss Mother    Diabetes Father    Diabetes Cousin    Colon cancer Neg Hx     Objective:   Supine  Sitting  Standing BP 130/60  118/60  120/68 Pulse 81  83  84 General: No acute distress.  Patient appears well-groomed.   Head:  Normocephalic/atraumatic Eyes:  fundi examined but not visualized Neck: supple, no paraspinal tenderness, full range of motion Back: No paraspinal tenderness Heart: regular rate and rhythm Lungs: Clear to auscultation bilaterally. Vascular: No carotid bruits. Neurological Exam: Mental status: alert and oriented to person, place, and time, recent and remote memory intact, fund of knowledge intact, attention and concentration intact, speech fluent and not dysarthric, language intact. Cranial nerves: CN I: not tested CN II: pupils equal, round and reactive to light, visual fields intact CN III, IV, VI:  full range of motion, no nystagmus, no ptosis CN V: facial sensation intact. CN VII: upper and lower face symmetric CN VIII: hearing intact CN IX, X: gag intact, uvula midline CN XI: sternocleidomastoid and trapezius muscles intact CN XII: tongue midline Bulk & Tone: normal, no fasciculations. Motor:  muscle strength 5/5 throughout Sensation:  Pinprick, temperature and vibratory sensation intact. Deep Tendon Reflexes:  2+ throughout,  toes downgoing.   Finger to nose testing:  Without dysmetria.   Heel to shin:  Without dysmetria.   Gait:  Normal station and stride.  Romberg negative.    Thank you for allowing me to take part in the care of this patient.  Metta Clines, DO  CC: Maximiano Coss, NP

## 2021-06-12 NOTE — Patient Instructions (Signed)
Full PFT performed today. °

## 2021-06-12 NOTE — Progress Notes (Signed)
Full PFT performed today. Patient was not able to complete pre- spirometry without error. Patient was not able to meet required frequency for PLETH. Nitrogen washout performed.

## 2021-06-15 ENCOUNTER — Encounter: Payer: Self-pay | Admitting: Neurology

## 2021-06-15 ENCOUNTER — Ambulatory Visit: Payer: No Typology Code available for payment source | Admitting: Neurology

## 2021-06-15 ENCOUNTER — Other Ambulatory Visit: Payer: Self-pay

## 2021-06-15 VITALS — Ht 70.0 in | Wt 211.0 lb

## 2021-06-15 DIAGNOSIS — R42 Dizziness and giddiness: Secondary | ICD-10-CM | POA: Diagnosis not present

## 2021-06-15 DIAGNOSIS — G450 Vertebro-basilar artery syndrome: Secondary | ICD-10-CM | POA: Diagnosis not present

## 2021-06-22 IMAGING — US US ABDOMEN LIMITED RUQ/ASCITES
1 series · 14 of 25 positions shown · non-contrast
Comparison: 01/07/2021

CLINICAL DATA: Cirrhosis

EXAM:
ULTRASOUND ABDOMEN LIMITED RIGHT UPPER QUADRANT

[Series 1: us abdomen limited ruq/ascites · 14 of 32 slices shown]
[im 1/32]
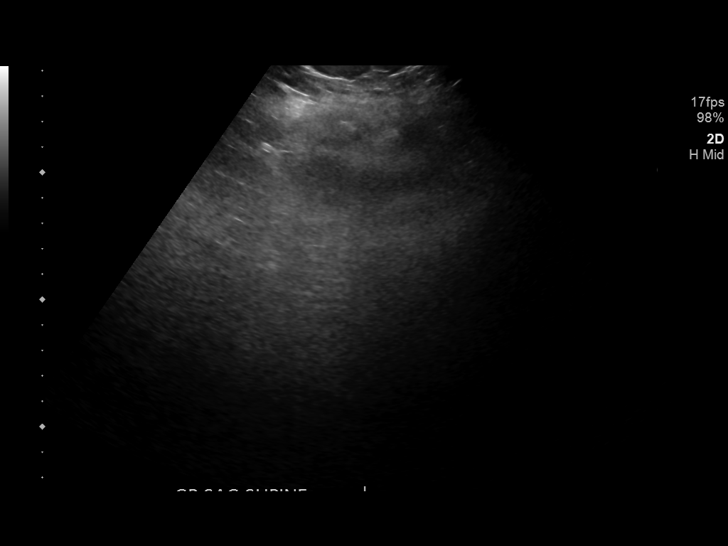
[im 3/32]
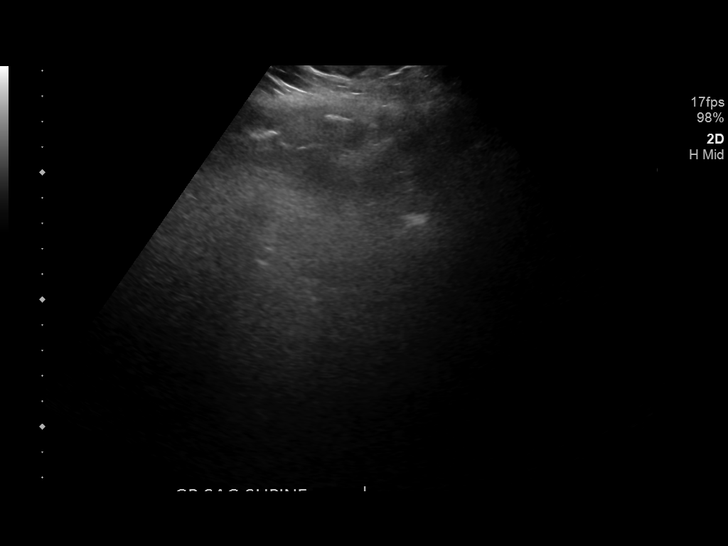
[im 6/32]
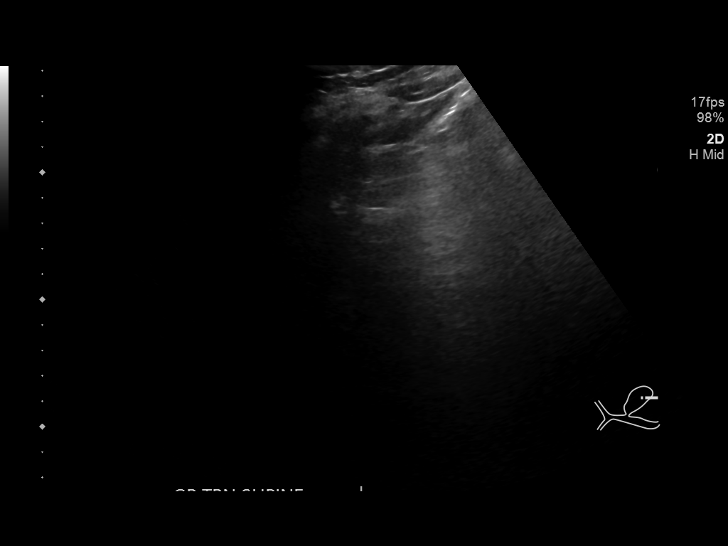
[im 8/32]
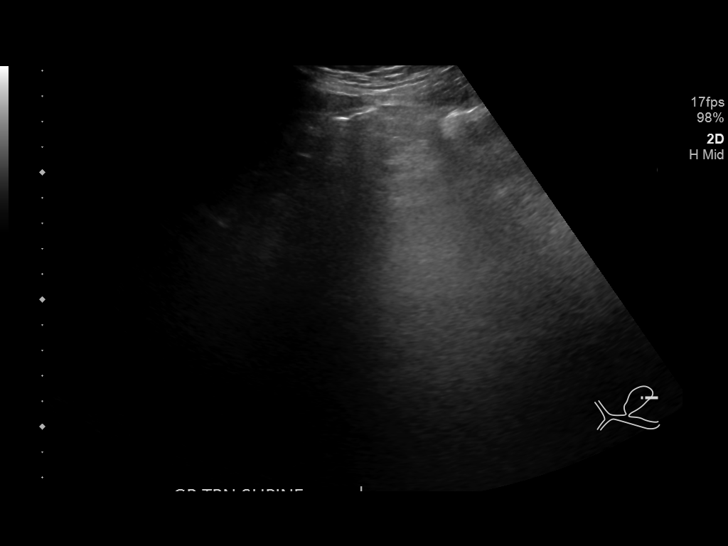
[im 11/32]
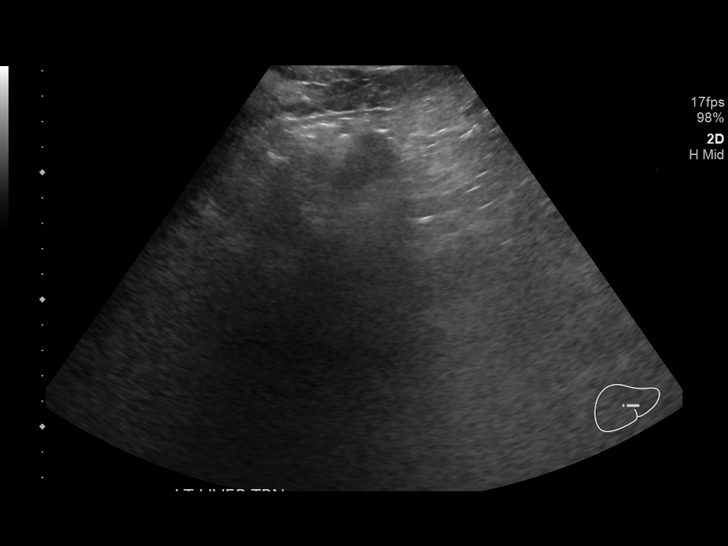
[im 12/32]
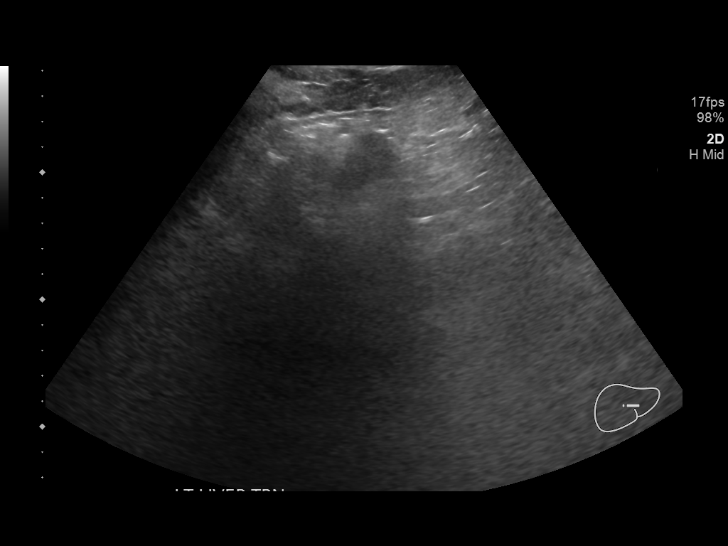
[im 15/32]
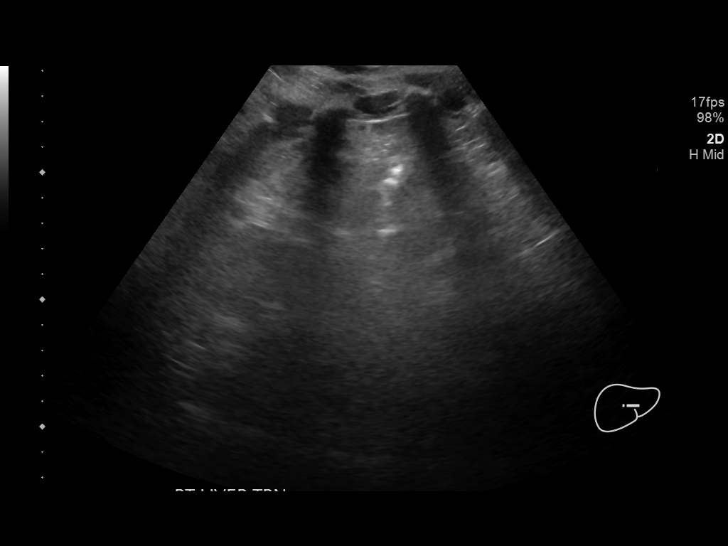
[im 17/32]
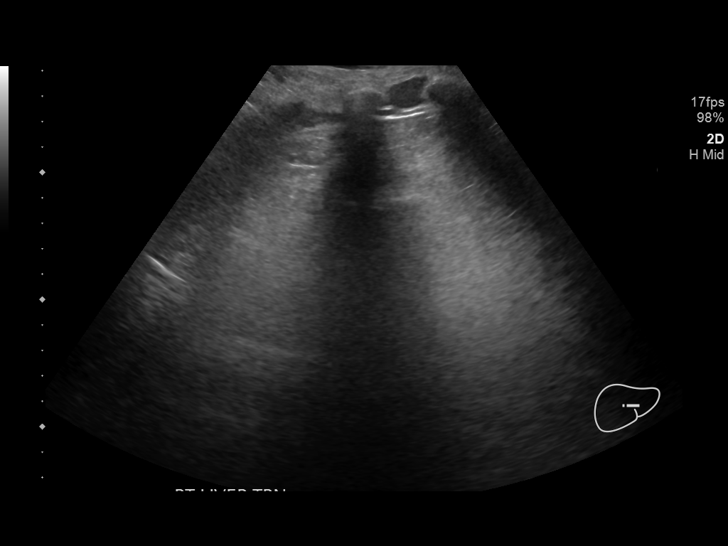
[im 20/32]
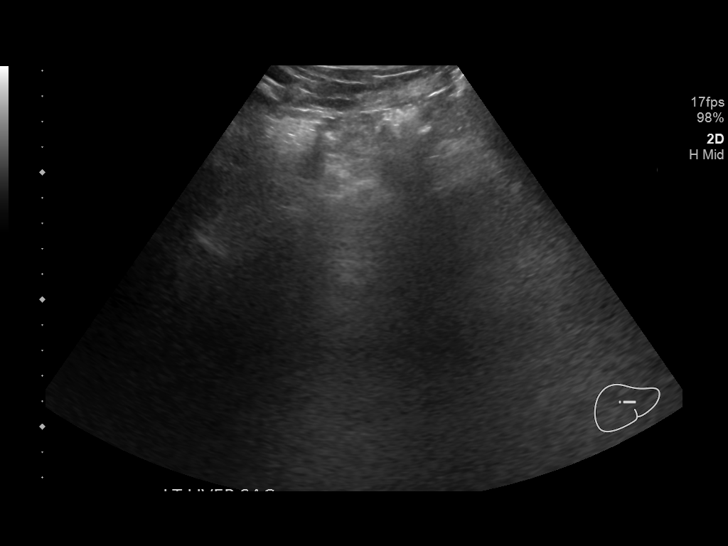
[im 21/32]
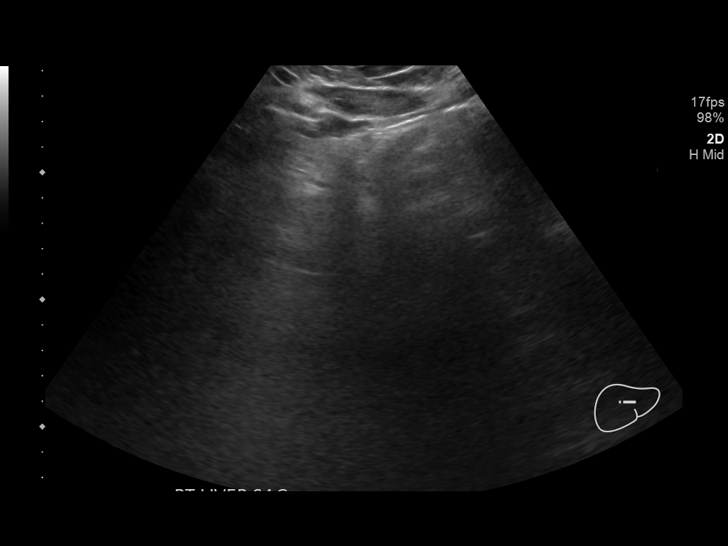
[im 24/32]
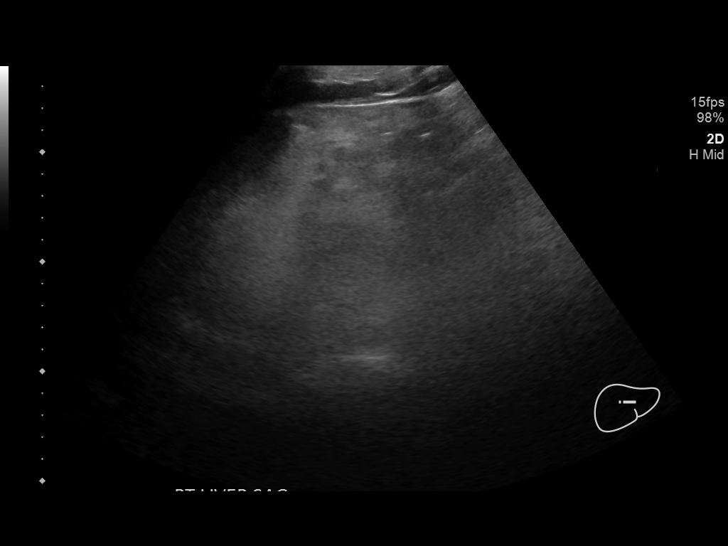
[im 26/32]
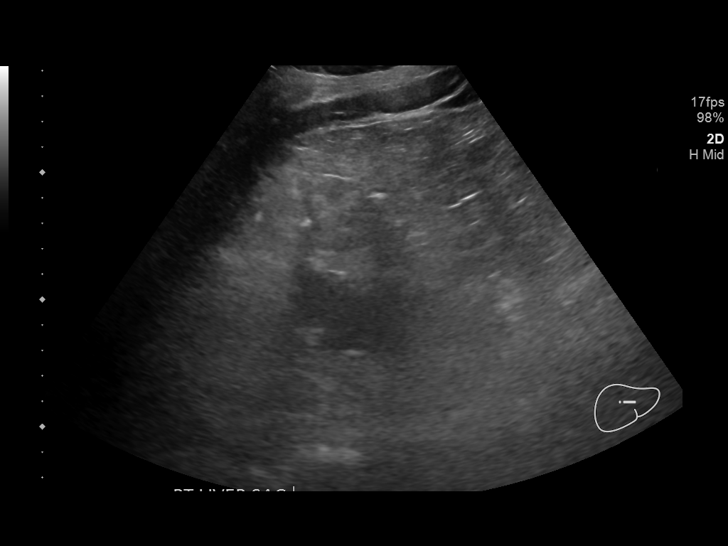
[im 29/32]
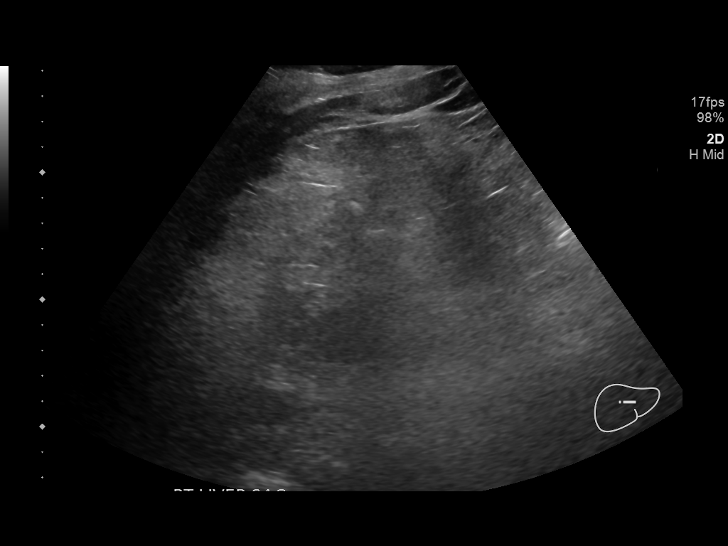
[im 32/32]
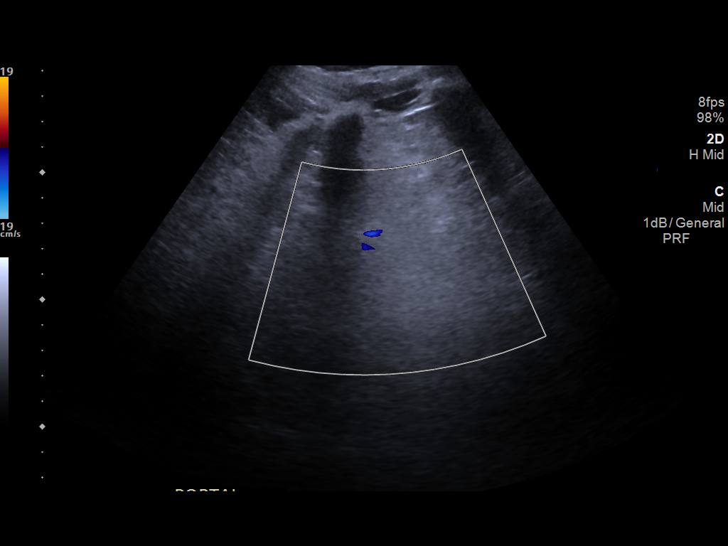

[14 of 25 positions shown; findings below may reference images not displayed]

FINDINGS: Gallbladder:

Not visualized.

Common bile duct:

Diameter: Not visualized.

Liver:

Limited visualization of the liver which appears diffusely
echogenic. Portal vein not visualized.

Other: None.
IMPRESSION: Very limited ultrasound evaluation of the right upper quadrant.
Nonvisualization of the gallbladder, common bile duct, and portal
vein. Limited visualization of the liver which appears diffusely
echogenic. Unable to reliably exclude hepatic lesions.

## 2021-06-22 IMAGING — CT CT HEAD W/O CM
3 series · 16 of 47 positions shown, 19 images · non-contrast
Comparison: None.

CLINICAL DATA: Seizure

EXAM:
CT HEAD WITHOUT CONTRAST
TECHNIQUE: Contiguous axial images were obtained from the base of the skull
through the vertex without intravenous contrast.

[Series 2: head wo · axial · 0.47mm/px · z∈[-182,-52]mm · 10 of 32 slices shown, 13 images]
[im 3/32  brain]
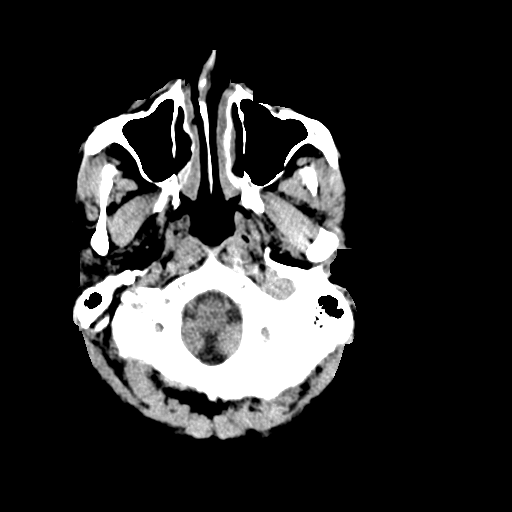
[im 3/32  bone]
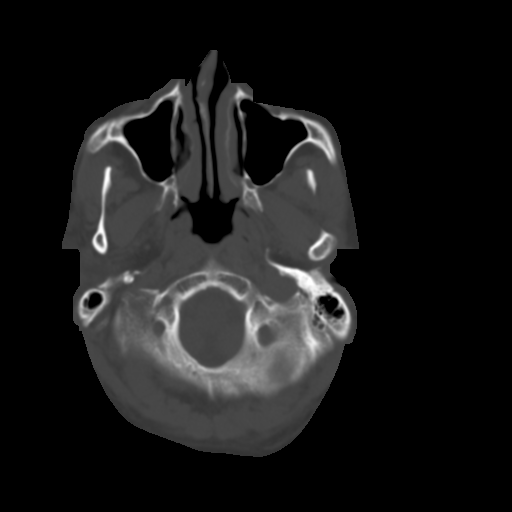
[im 6/32  brain]
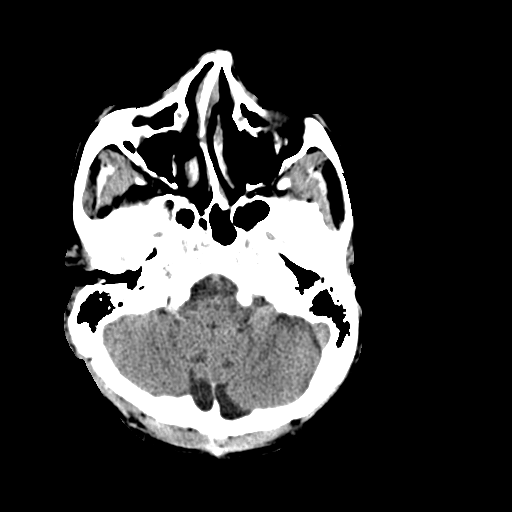
[im 9/32  brain]
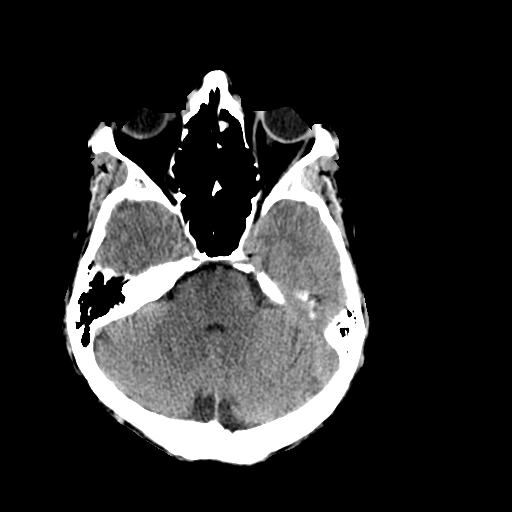
[im 11/32  brain]
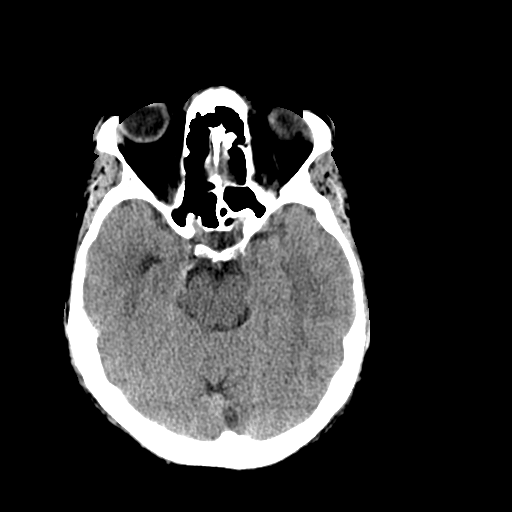
[im 14/32  brain]
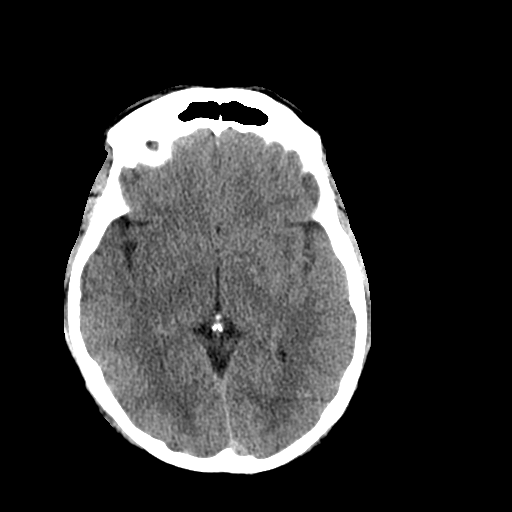
[im 14/32  bone]
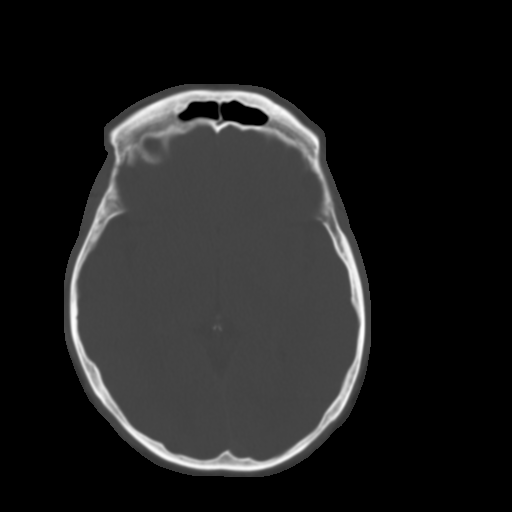
[im 18/32  brain]
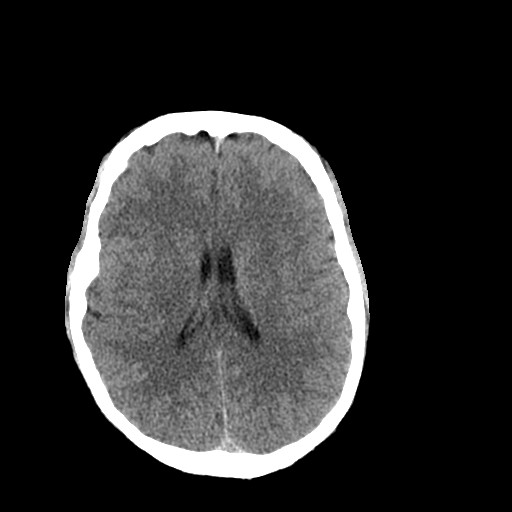
[im 21/32  brain]
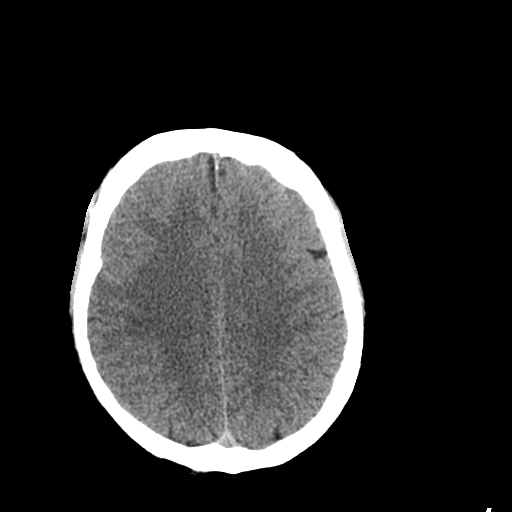
[im 24/32  brain]
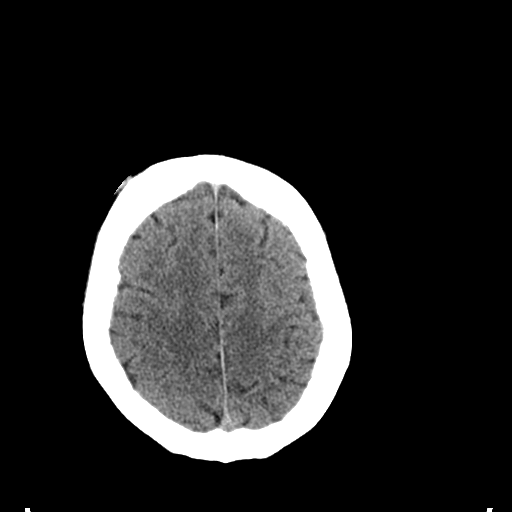
[im 26/32  brain]
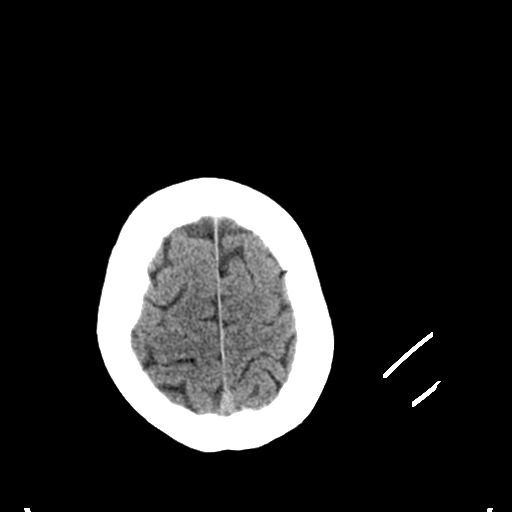
[im 26/32  bone]
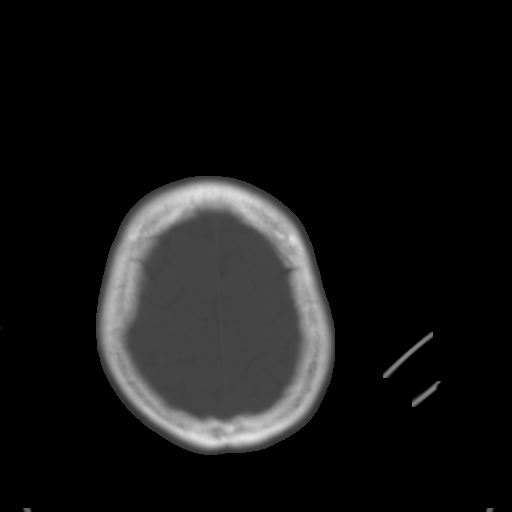
[im 29/32  brain]
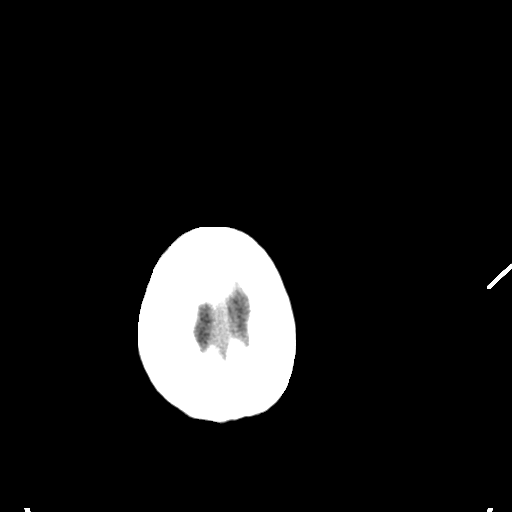

[Series 4: coronal soft tissue · coronal · 0.30mm/px · 3 of 63 slices shown]
[im 21/63  brain]
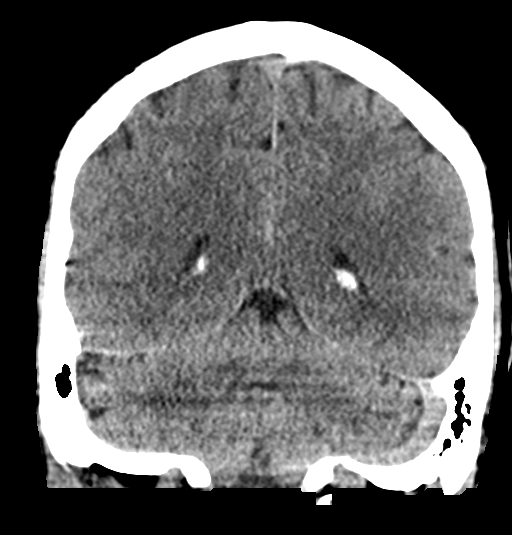
[im 28/63  brain]
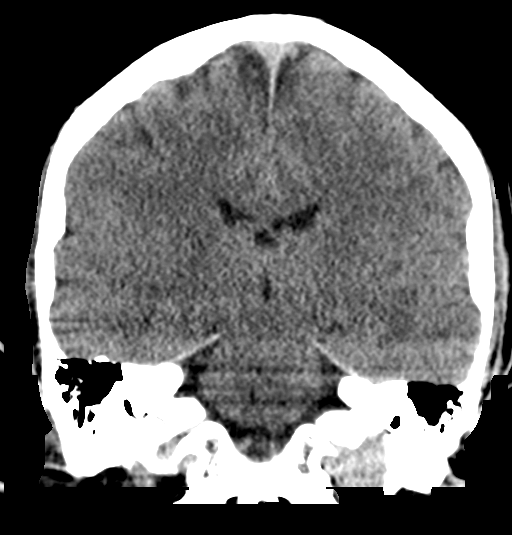
[im 35/63  brain]
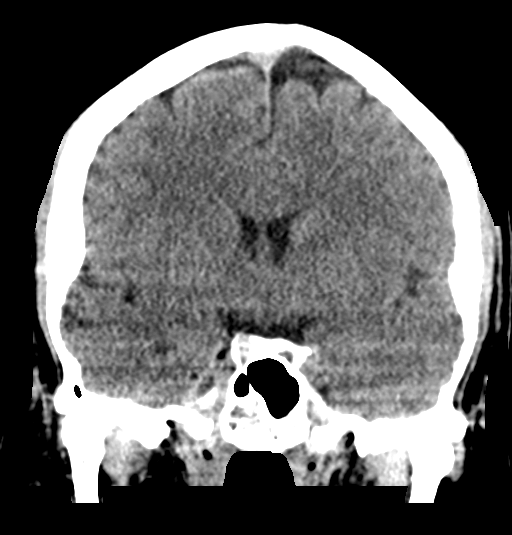

[Series 5: sagittal soft tissue · sagittal · 0.30mm/px · 3 of 51 slices shown]
[im 17/51  brain]
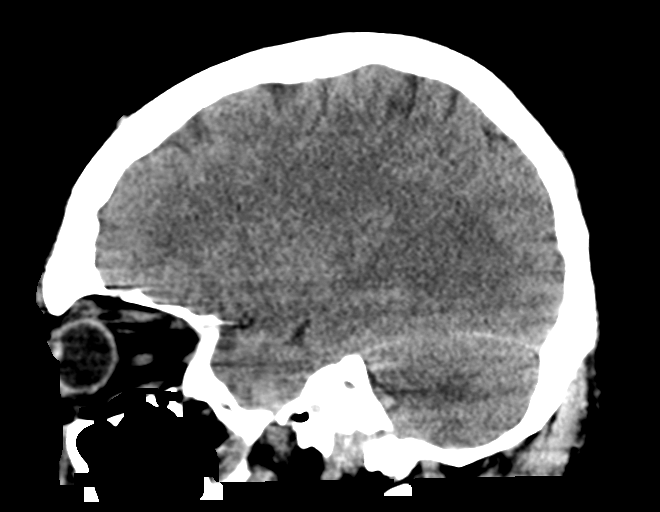
[im 26/51  brain]
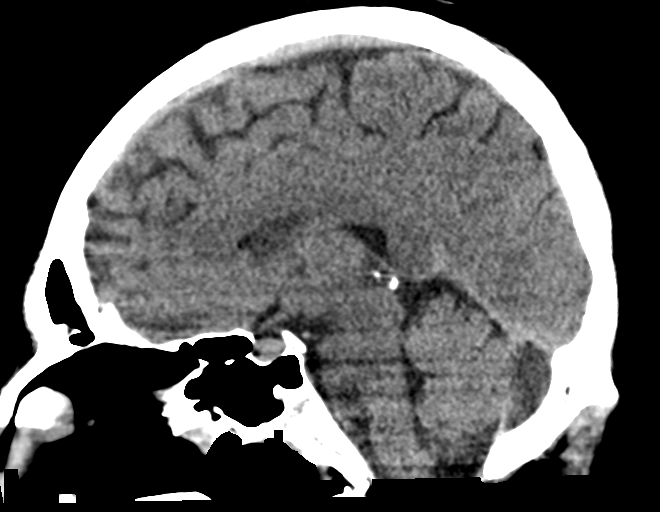
[im 34/51  brain]
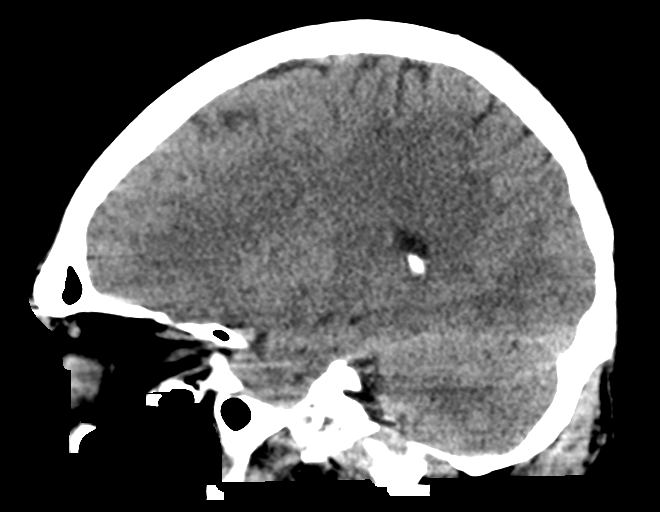

[16 of 47 positions shown; findings below may reference images not displayed]

FINDINGS: Brain: Normal anatomic configuration. No abnormal intra or
extra-axial mass lesion or fluid collection. No abnormal mass effect
or midline shift. No evidence of acute intracranial hemorrhage or
infarct. Ventricular size is normal. Cerebellum unremarkable.

Vascular: Unremarkable

Skull: Intact

Sinuses/Orbits: Paranasal sinuses are clear. Orbits are
unremarkable.

Other: Mastoid air cells and middle ear cavities are clear.
IMPRESSION: No acute intracranial abnormality.

## 2021-06-25 ENCOUNTER — Other Ambulatory Visit: Payer: Self-pay | Admitting: Physician Assistant

## 2021-06-25 ENCOUNTER — Ambulatory Visit (INDEPENDENT_AMBULATORY_CARE_PROVIDER_SITE_OTHER): Payer: No Typology Code available for payment source | Admitting: Gastroenterology

## 2021-06-25 ENCOUNTER — Encounter: Payer: Self-pay | Admitting: Gastroenterology

## 2021-06-25 VITALS — BP 118/70 | HR 78 | Ht 70.0 in | Wt 208.2 lb

## 2021-06-25 DIAGNOSIS — K7290 Hepatic failure, unspecified without coma: Secondary | ICD-10-CM

## 2021-06-25 DIAGNOSIS — K766 Portal hypertension: Secondary | ICD-10-CM | POA: Diagnosis not present

## 2021-06-25 DIAGNOSIS — K7031 Alcoholic cirrhosis of liver with ascites: Secondary | ICD-10-CM

## 2021-06-25 DIAGNOSIS — K7682 Hepatic encephalopathy: Secondary | ICD-10-CM

## 2021-06-25 NOTE — Progress Notes (Signed)
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  Abdominal imaging every 6 months for [facility] screening.  Hepatic encephalopathy: Clinically controlled on lactulose and Xifaximin. No dose charges today.   Ascites and peripheral edema: Management is becoming increasing more difficult. Continue 2000 mg sodium restricted diet, titrate diuretics as tolerated. Labs today to follow his renal function.   Shortness of breath: Seeing pulmonologist. Must consider HPS and portopulmonary syndrome in the differential. I assume this will be evaluated by [facility] as part of his transplant evaluation.   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 No obvious mass. High risk for cholecystectomy at this time. Discussed potential symptoms of symptomatic gallbladder disease.    PLAN: - Continue 2000 mg sodium restricted diet - Continue diuretics including Aldactone 100 mg daily and Lasix 20 mg daily - Continue lactulose and Xifaxan - Follow-up with [facility] as scheduled for liver transplant evaluation - Follow-up with me or Colleen in 6-8 weeks, earlier if needed - Abdominal ultrasound 1/23 for  hepatocellular carcinoma - He was counseled to continue to abstain from all alcohol   Please see the "Patient Instructions" section for addition details about the plan.  HPI: Ian Hall is a 55 y.o. male who returns in follow-up. Last seen in the office by [facility] 05/11/21. Interval history obtained through the patient and review of his electronic health record. Seen at the [facility] for transplant evaluation. They are requiring formal alcohol counseling. He is working to set it up.   Ongoing trouble with ascites and lower extremity edema despite lasix 20 mg QD and spironolactone 100 mg daily. Required 3.4L paracentesis 05/12/21.   Encephalopathy has been well controlled on lactulose and Xifaxan.  Overall doing "okay." Notes that he has severe fatigue.   Has upcoming follow-up with [facility] to initial transplant evaluation.  He remains very motivated to live and wants to get a transplant.  He reports no alcohol use since December 2021.   RUQ abdominal pain that precedes defecation. Having 3-4 BM daily.  Reports some shortness of breath with exertion. Will have a sleep study 06/30/21.   His most recent labs from 05/15/2021 show a sodium 135, creatinine 0.88, total bilirubin 9.0, AST 141, ALT 31, alk phos 340, albumin 2.8, hemoglobin 12.4, platelets 64 Most recent INR from 04/21/2021 showed an INR of 1.5 Most recent AFP from 03/31/2021 was 5.5   Recent evaluation includes: - CT abd/pelvis with contrast 12/24/20: cirrhosis with perihepatic ascites and splenomegaly, patent portal vein, calcified gallbladder wall versus large stone in the GB (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), diverticulosis, colonic wall thickening, anasarca - Started steroids for possible alcoholic hepatitis - Abdominal ultrasound 12/26/20: calcified gallbladder wall versus large gallstone - Not enough ascites for paracentesis  12/27/20 - Stool + for Enteroaggregative E coli, treated with  Cipro - EGD 12/30/20: food in the stomach, no varices or portal hypertensive gastropathy - Colonoscopy 12/30/20: patchy colitis, diverticulosis - MRI 2/9/222: cirrhosis, splenomegaly, small volume ascites, small gastroesophageal and paraumbilical varices, mild porta hepatis adenopathy, cholelithiasis, colonic diverticulosis - Liver biopsy 01/09/21: Cirrhosis of uncertain etiology, marked cholestasis, no iron overload - suspected burnt out NASH or ASH, possible overlapping drug injury - Venogram 01/09/21: Portosystemic gradient of 47m Hg - Head CT 02/24/21 - no acute abnormality - Limited RUQ ultrasound 02/24/21: Limited evaluation. Echogenic liver. - CT Angio Chest for PE 04/10/21: Cirrhosis and splenomegaly, no evidence of PE, porcelain gallbladder - MRI 05/29/21: cirrhosis, no HCC, ascites, portal hypertension, splenomegaly      Past Medical History:  Diagnosis Date   Anxiety    [place]    COVID-19     Past Surgical History:  Procedure Laterality Date   ANKLE SURGERY Right    IR PARACENTESIS  01/07/2021   IR PARACENTESIS  01/30/2021   IR PARACENTESIS  05/12/2021   IR PARACENTESIS  05/27/2021   IR TRANSCATHETER BX  01/09/2021   IR [place] VASC ACCESS RIGHT  01/09/2021   IR VENOGRAM HEPATIC W HEMODYNAMIC EVALUATION  01/09/2021    Current Outpatient Medications  Medication Sig Dispense Refill   albuterol (VENTOLIN HFA) 108 (90 Base) MCG/ACT inhaler INHALE 2 PUFFS INTO THE LUNGS EVERY 6 HOURS AS NEEDED FOR WHEEZING OR SHORTNESS OF BREATH 6.7 g 3   dicyclomine (BENTYL) 10 MG capsule Take 1 capsule (10 mg total) by mouth 4 (four) times daily -  before meals and at bedtime. 120 capsule 2   fluticasone furoate-vilanterol (BREO ELLIPTA) 100-25 MCG/INH AEPB Inhale 1 puff into the lungs daily. 28 each 6   Fluticasone-Umeclidin-Vilant (TRELEGY ELLIPTA) 100-62.5-25 MCG/INH AEPB Inhale 1 puff into the lungs daily. Rinse mouth after 1 each 0   furosemide (LASIX) 20 MG tablet Take 1 tablet (20 mg  total) by mouth daily. 30 tablet 6   hydrOXYzine (ATARAX/VISTARIL) 10 MG tablet TAKE 1/2 TO 1 TABLET(5 TO 10 MG) BY MOUTH AT BEDTIME AS NEEDED FOR ITCHING 90 tablet 0   lactulose (CHRONULAC) 10 GM/15ML solution Take 45 mLs (30 g total) by mouth 3 (three) times daily. 12150 mL 0   meclizine (ANTIVERT) 12.5 MG tablet TAKE 1 TABLET(12.5 MG) BY MOUTH DAILY AS NEEDED FOR DIZZINESS 30 tablet 1   pantoprazole (PROTONIX) 40 MG tablet Take 1 tablet (40 mg total) by mouth 2 (two) times daily. 60 tablet 0   rifaximin (XIFAXAN) 550 MG TABS tablet Take 1 tablet (550 mg total) by mouth 2 (two) times daily. 180 tablet 0   spironolactone (ALDACTONE) 100 MG tablet Take 1 tablet (100 mg total) by mouth daily. 30 tablet 6   folic acid (FOLVITE) 1 MG tablet TAKE 1 TABLET BY MOUTH EVERY DAY 30 tablet 1   No current facility-administered medications for this visit.    Allergies as of 06/25/2021 - Review Complete 06/25/2021  Allergen Reaction Noted   Hydrocodone Itching and Other (See Comments) 08/26/2015      Physical Exam: General:   Alert, in NAD. Scleral icterus Heart:  Regular rate and rhythm; no murmurs Pulm: Clear anteriorly; no wheezing Abdomen:  Protuberent but soft. Bruise over the right flank at th sight of prior paracentesis. Nontender. Nondistended. Normal bowel sounds. No rebound or guarding. No fluid wave.  No hepatosplenomegaly. LAD: No inguinal or umbilical LAD Extremities:  1-2+ LE  edema Neurologic:  Alert and  oriented x4;  grossly normal neurologically; no asterixis or clonus. Skin: No jaundice. Mild palmar erythema. Few spider angioma on the chest wall.  No Terry's nails. Psych:  Alert and cooperative. Normal mood and affect.     Etheridge Geil L. Tarri Glenn, MD, MPH 06/28/2021, 7:29 PM

## 2021-06-25 NOTE — Patient Instructions (Addendum)
It was my pleasure to provide care to you today. Based on our discussion, I am providing you with my recommendations below:  RECOMMENDATION(S):   Please continue your medication regimen as prescribed Continue to abstain from alcohol Continue a 2000 mg sodium restricted diet  IMAGING:  You will be contacted by [facility] Scheduling (Your caller ID will indicate phone # [phone]) within the next business 7-10 business days to schedule your Abdominal Ultrasound in January. If you have not heard from them within 7-10 business days, please call [place] at [phone] to follow up on the status of your appointment.    FOLLOW UP:  I would like for you to follow up with me AFTER your [facility] appointment in September. Please ensure you keep your appointment with [facility].   BMI:  If you are age 34 or younger, your body mass index should be between 19-25. Your There is no height or weight on file to calculate BMI. If this is out of the aformentioned range listed, please consider follow up with your Primary Care Provider.   MY CHART:  The [place] [facility] providers would like to encourage you to use [facility] to communicate with providers for non-urgent requests or questions.  Due to long hold times on the telephone, sending your provider a message by [facility] may be a faster and more efficient way to get a response.  Please allow 48 business hours for a response.  Please remember that this is for non-urgent requests.   Thank you for trusting me with your gastrointestinal care!    Thornton Park, MD, MPH

## 2021-06-25 NOTE — Progress Notes (Signed)
[facility]  Phone: [phone]  Fax: [phone]   Imaging Ordered: Abd [place] - NEEDED IN JAN 2023   Diagnosis: Cirrhosis with ascites   Ordering Provider: Dr. Tarri Glenn   Is a Prior Authorization needed? We are in the process of obtaining it now   Is the patient Diabetic? No   Does the patient have Hypertension? Yes   Does the patient have any implanted devices or hardware? No   Date of last BUN/Creat, if needed? N/A   Patient Weight? 208#   Is the patient able to get on the table? Yes   Has the patient been diagnosed with COVID? No   Is the patient waiting on COVID testing results? No   Thank you for your assistance!  [facility]

## 2021-06-26 ENCOUNTER — Other Ambulatory Visit: Payer: Self-pay | Admitting: Physician Assistant

## 2021-06-28 ENCOUNTER — Encounter: Payer: Self-pay | Admitting: Gastroenterology

## 2021-06-30 ENCOUNTER — Ambulatory Visit: Payer: No Typology Code available for payment source

## 2021-06-30 ENCOUNTER — Other Ambulatory Visit: Payer: Self-pay

## 2021-06-30 DIAGNOSIS — G4733 Obstructive sleep apnea (adult) (pediatric): Secondary | ICD-10-CM

## 2021-06-30 DIAGNOSIS — R0683 Snoring: Secondary | ICD-10-CM

## 2021-07-01 DIAGNOSIS — G4733 Obstructive sleep apnea (adult) (pediatric): Secondary | ICD-10-CM | POA: Diagnosis not present

## 2021-07-03 ENCOUNTER — Ambulatory Visit (HOSPITAL_COMMUNITY): Payer: No Typology Code available for payment source

## 2021-07-04 ENCOUNTER — Ambulatory Visit
Admission: RE | Admit: 2021-07-04 | Discharge: 2021-07-04 | Disposition: A | Discharge status: Home / Self Care | Payer: No Typology Code available for payment source | Source: Ambulatory Visit | Attending: Neurology | Admitting: Neurology

## 2021-07-04 ENCOUNTER — Other Ambulatory Visit: Payer: Self-pay

## 2021-07-04 DIAGNOSIS — R42 Dizziness and giddiness: Secondary | ICD-10-CM

## 2021-07-04 DIAGNOSIS — G450 Vertebro-basilar artery syndrome: Secondary | ICD-10-CM

## 2021-07-04 MED ORDER — GADOBENATE DIMEGLUMINE 529 MG/ML IV SOLN
20.0000 mL | Freq: Once | INTRAVENOUS | Status: AC | PRN
  Administered 2021-07-04: 20 mL via INTRAVENOUS

## 2021-07-08 ENCOUNTER — Telehealth: Payer: Self-pay | Admitting: Registered Nurse

## 2021-07-08 NOTE — Telephone Encounter (Signed)
..  Type of form received:   FMLA  Additional comments:   Received [id number]  Form should be Faxed to:[phone]  Form should be mailed to:    Is patient requesting call for pickup:   Form placed:  In [employer] bin up front  Engineering geologist.  Provider will determine charge.  Individual made aware of 3-5 business day turn around Yes?

## 2021-07-13 NOTE — Telephone Encounter (Signed)
Pt called in asking for an Update on the FMLA forms please call  [phone].  He states that these forms have been due and he needs them completed asap

## 2021-07-14 ENCOUNTER — Telehealth: Payer: Self-pay

## 2021-07-14 NOTE — Telephone Encounter (Signed)
Patient states [employer] faxed over disability forms to be completed on 8/9.  States he spoke with someone in our office on 8/12 stating the forms had been received.    Please follow up with patient in regard at [phone].

## 2021-07-14 NOTE — Telephone Encounter (Signed)
Pt is calling a following with the Disability  Paperwork that was dropped off 07/18 from [place]?   Pt call back [phone]

## 2021-07-15 NOTE — Telephone Encounter (Signed)
Patient paperwork has been faxed over.

## 2021-07-15 NOTE — Progress Notes (Signed)
Pt advised of his MRI/MRA results

## 2021-07-17 NOTE — Telephone Encounter (Signed)
I just faxed it again and also keep getting successful notices.

## 2021-07-17 NOTE — Telephone Encounter (Signed)
Patient states disability insurance company has not received forms.  Please place copy up front for patient to pick up.  Call [phone].

## 2021-07-18 ENCOUNTER — Other Ambulatory Visit: Payer: Self-pay | Admitting: Physician Assistant

## 2021-07-20 ENCOUNTER — Other Ambulatory Visit: Payer: Self-pay

## 2021-07-20 ENCOUNTER — Ambulatory Visit (INDEPENDENT_AMBULATORY_CARE_PROVIDER_SITE_OTHER): Payer: No Typology Code available for payment source | Admitting: Pulmonary Disease

## 2021-07-20 ENCOUNTER — Encounter: Payer: Self-pay | Admitting: Pulmonary Disease

## 2021-07-20 VITALS — BP 122/64 | HR 74 | Temp 97.5°F | Ht 67.0 in | Wt 210.8 lb

## 2021-07-20 DIAGNOSIS — R0602 Shortness of breath: Secondary | ICD-10-CM | POA: Diagnosis not present

## 2021-07-20 DIAGNOSIS — J4530 Mild persistent asthma, uncomplicated: Secondary | ICD-10-CM

## 2021-07-20 NOTE — Progress Notes (Signed)
Synopsis: Referred in July 2022 for Dyspnea on Exertion by Maximiano Coss, NP  Subjective:   PATIENT ID: Ian Hall DOB: [birth year], MRN: [id number]   HPI  Chief Complaint  Patient presents with   Follow-up    Still having SOB.     Ian Hall is a 55 year old Hall, former smoker with alcoholic cirrhosis who refturns to pulmonary clinic for shortness of breath on exertion.   He continues to experience dyspnea on exertion but it seems improved with Breo ellipta daily and as needed albuterol. He has intermittent cough and wheezing with dyspnea that is relieved with as needed albuterol.  Pulmonary function tests on 06/12/21 were normal and a home sleep study on 06/30/21 showed mild obstructive sleep apnea with an AHI of 9.2/hr.   He is undergoing liver transplant evaluation at [facility].  He is walking at the mall for exercise multiple days throughout the week. He does report that sometimes he does not exercise due to feeling dizzy or to short of breath.   OV 06/03/21 He has noticed progressive shortness of breath since January. He reports he can walk maybe half a block before getting short of breath where he needs a break. He has some wheezing when he lays down. He has been provided with an albuterol inhaler which does help with his shortness of breath at times. He does report some cough with clear sputum with the dyspnea and wheezing. He denies any respiratory issues in childhood or his younger years.    He has received multiple paracenteses for ascites but denies improvement in his breathing after each procedure.   He quit smoking in 2012 and smoked 0.25 pack per day for roughly 30 years.   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 He only wore a respirator or N95 mask around [place] as he had an allergic reaction to this material, otherwise he did not wear protective equipment on a regular basis around the various materials or chemicals.  He does report  significant snoring at night. He does complain of nocturia.    Past Medical History:  Diagnosis Date   Anxiety    Cirrhosis ([place])    COVID-58      Family History  Problem Relation Age of Onset   Diabetes Mother    Memory loss Mother    Diabetes Father    Diabetes Cousin    Colon cancer Neg Hx      Social History   Socioeconomic History   Marital status: Married    Spouse name: Not on file   Number of children: 0   Years of education: Not on file   Highest education level: Not on file  Occupational History   Occupation: IT sales professional: [employer]  Tobacco Use   Smoking status: Former    Packs/day: 0.50    Years: 23.00    Pack years: 11.50    Types: Cigarettes    Start date: 1981    Quit date: 12/16/2002    Years since quitting: 18.6   Smokeless tobacco: Never  Vaping Use   Vaping Use: Never used  Substance and Sexual Activity   Alcohol use: Not Currently    Comment: none for 3-4 years (stated 12/16/2020)   Drug use: No   Sexual activity: Yes    Birth control/protection: None  Other Topics Concern   Not on file  Social History Narrative   Not on file   Social  Determinants of Health   Financial Resource Strain: Not on file  Food Insecurity: Not on file  Transportation Needs: Not on file  Physical Activity: Not on file  Stress: Not on file  Social Connections: Not on file  Intimate Partner Violence: Not on file     Allergies  Allergen Reactions   Hydrocodone Itching and Other (See Comments)    Noted with hydrocodone cough syrup. Itching only, no hives.  02/03/16: denied itching with recent hydrocodone cough syrup. Only one episode of itching previously.      Outpatient Medications Prior to Visit  Medication Sig Dispense Refill   albuterol (VENTOLIN HFA) 108 (90 Base) MCG/ACT inhaler INHALE 2 PUFFS INTO THE LUNGS EVERY 6 HOURS AS NEEDED FOR WHEEZING OR SHORTNESS OF BREATH 6.7 g 3   dicyclomine (BENTYL) 10 MG capsule TAKE 1 CAPSULE  BY MOUTH 4 TIMES A DAY BEFORE MEALS AND AT BEDTIME 120 capsule 1   fluticasone furoate-vilanterol (BREO ELLIPTA) 100-25 MCG/INH AEPB Inhale 1 puff into the lungs daily. 28 each 6   folic acid (FOLVITE) 1 MG tablet TAKE 1 TABLET BY MOUTH EVERY DAY 30 tablet 1   furosemide (LASIX) 20 MG tablet Take 1 tablet (20 mg total) by mouth daily. 30 tablet 6   hydrOXYzine (ATARAX/VISTARIL) 10 MG tablet TAKE 1/2 TO 1 TABLET(5 TO 10 MG) BY MOUTH AT BEDTIME AS NEEDED FOR ITCHING 90 tablet 0   lactulose (CHRONULAC) 10 GM/15ML solution Take 45 mLs (30 g total) by mouth 3 (three) times daily. 12150 mL 0   meclizine (ANTIVERT) 12.5 MG tablet TAKE 1 TABLET(12.5 MG) BY MOUTH DAILY AS NEEDED FOR DIZZINESS 30 tablet 1   pantoprazole (PROTONIX) 40 MG tablet Take 1 tablet (40 mg total) by mouth 2 (two) times daily. 60 tablet 0   rifaximin (XIFAXAN) 550 MG TABS tablet Take 1 tablet (550 mg total) by mouth 2 (two) times daily. 180 tablet 0   spironolactone (ALDACTONE) 100 MG tablet Take 1 tablet (100 mg total) by mouth daily. 30 tablet 6   Fluticasone-Umeclidin-Vilant (TRELEGY ELLIPTA) 100-62.5-25 MCG/INH AEPB Inhale 1 puff into the lungs daily. Rinse mouth after 1 each 0   No facility-administered medications prior to visit.    Review of Systems  Constitutional:  Negative for chills, fever, malaise/fatigue and weight loss.  HENT:  Negative for congestion, sinus pain and sore throat.   Eyes: Negative.   Respiratory:  Positive for cough, shortness of breath and wheezing. Negative for hemoptysis and sputum production.   Cardiovascular:  Negative for chest pain, palpitations, orthopnea, claudication and leg swelling.  Gastrointestinal:  Negative for abdominal pain, heartburn, nausea and vomiting.  Genitourinary: Negative.   Musculoskeletal:  Negative for joint pain and myalgias.  Skin:  Negative for rash.  Neurological:  Negative for weakness.  Endo/Heme/Allergies: Negative.   Psychiatric/Behavioral: Negative.        Objective:   Vitals:   07/20/21 1326  BP: 122/64  Pulse: 74  Temp: (!) 97.5 F (36.4 C)  TempSrc: Oral  SpO2: 99%  Weight: 95.6 kg  Height: 5' 7"  (1.702 m)     Physical Exam Constitutional:      General: He is not in acute distress. HENT:     Head: Normocephalic and atraumatic.     Nose: Nose normal.     Mouth/Throat:     Mouth: Mucous membranes are moist.     Pharynx: Oropharynx is clear.  Eyes:     Extraocular Movements: Extraocular movements intact.  Conjunctiva/sclera: Conjunctivae normal.     Pupils: Pupils are equal, round, and reactive to light.  Cardiovascular:     Rate and Rhythm: Normal rate and regular rhythm.     Pulses: Normal pulses.     Heart sounds: Normal heart sounds. No murmur heard. Abdominal:     General: Bowel sounds are normal.     Palpations: Abdomen is soft.     Tenderness: There is no abdominal tenderness.  Musculoskeletal:     Right lower leg: No edema.     Left lower leg: No edema.  Lymphadenopathy:     Cervical: No cervical adenopathy.  Skin:    General: Skin is warm and dry.  Neurological:     General: No focal deficit present.     Mental Status: He is alert.   CBC    Component Value Date/Time   WBC 4.4 05/15/2021 1001   RBC 3.71 (L) 05/15/2021 1001   HGB 12.4 (L) 05/15/2021 1001   HGB 13.0 02/04/2021 1557   HCT 35.9 (L) 05/15/2021 1001   HCT 34.5 (L) 02/04/2021 1557   PLT 64.0 (L) 05/15/2021 1001   PLT 93 (LL) 02/04/2021 1557   MCV 96.9 05/15/2021 1001   MCV 91 02/04/2021 1557   MCH 33.7 (H) 04/24/2021 1444   MCHC 34.5 05/15/2021 1001   RDW 17.0 (H) 05/15/2021 1001   RDW 14.3 02/04/2021 1557   LYMPHSABS 0.9 05/15/2021 1001   LYMPHSABS 1.5 01/14/2021 1643   MONOABS 0.5 05/15/2021 1001   EOSABS 0.1 05/15/2021 1001   EOSABS 0.1 01/14/2021 1643   BASOSABS 0.0 05/15/2021 1001   BASOSABS 0.1 01/14/2021 1643   BMP Latest Ref Rng & Units 05/15/2021 05/11/2021 04/30/2021  Glucose 70 - 99 mg/dL 100(H) 95 135(H)  BUN  6 - 23 mg/dL 11 11 17   Creatinine 0.40 - 1.50 mg/dL 0.88 0.87 0.92  BUN/Creat Ratio 6 - 22 (calc) - - -  Sodium 135 - 145 mEq/L 135 135 131(L)  Potassium 3.5 - 5.1 mEq/L 3.7 4.1 4.1  Chloride 96 - 112 mEq/L 106 106 104  CO2 19 - 32 mEq/L 22 22 20   Calcium 8.4 - 10.5 mg/dL 8.1(L) 8.3(L) 8.0(L)   Chest imaging: CTA Chest 04/10/21 No evidence of pulmonary embolus. 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 Changes of cirrhosis with splenomegaly.  PFT: PFT Results Latest Ref Rng & Units 06/12/2021  FVC-Pre L 3.67  FVC-Predicted Pre % 85  FVC-Post L 3.54  FVC-Predicted Post % 82  Pre FEV1/FVC % % 87  Post FEV1/FCV % % 87  FEV1-Pre L 3.18  FEV1-Predicted Pre % 95  FEV1-Post L 3.07  DLCO uncorrected ml/min/mmHg 23.45  DLCO UNC% % 92  DLCO corrected ml/min/mmHg 25.16  DLCO COR %Predicted % 99  DLVA Predicted % 106  PFTs are normal in 2022  Echo 02/24/21: LV EF 60-65%. LV has normal function. Mild concentric LVH. Grade I diastolic dysfunction. RV systolic function is normal and RV is normal size. Trivial mitral regurgitation.     Assessment & Plan:   Mild persistent reactive airway disease without complication  Shortness of breath  Discussion: Ian Hall is a 55 year old Hall, former smoker with alcoholic cirrhosis who returns to pulmonary clinic for shortness of breath on exertion.   He appears to have reactive airways disease / mild persistent asthma that is improved with daily Breo ellipta and as needed albuterol.   He has mild obstructive sleep apnea with AHI 9.2/hr that does not require CPAP therapy  at this time. I have recommended some weight loss to help with this. If he becomes more symptomatic in the future, we can either set him up for CPAP titration study or order him an auto-titrating CPAP with follow up with a sleep specialist.   His CT scan does not show concern for parenchymal lung disease and simple walk does not indicate exertional hypoxemia due to  shunting.   Follow up in 6 months.  Freda Jackson, MD [facility] Office: [phone]    Current Outpatient Medications:    albuterol (VENTOLIN HFA) 108 (90 Base) MCG/ACT inhaler, INHALE 2 PUFFS INTO THE LUNGS EVERY 6 HOURS AS NEEDED FOR WHEEZING OR SHORTNESS OF BREATH, Disp: 6.7 g, Rfl: 3   dicyclomine (BENTYL) 10 MG capsule, TAKE 1 CAPSULE BY MOUTH 4 TIMES A DAY BEFORE MEALS AND AT BEDTIME, Disp: 120 capsule, Rfl: 1   fluticasone furoate-vilanterol (BREO ELLIPTA) 100-25 MCG/INH AEPB, Inhale 1 puff into the lungs daily., Disp: 28 each, Rfl: 6   folic acid (FOLVITE) 1 MG tablet, TAKE 1 TABLET BY MOUTH EVERY DAY, Disp: 30 tablet, Rfl: 1   furosemide (LASIX) 20 MG tablet, Take 1 tablet (20 mg total) by mouth daily., Disp: 30 tablet, Rfl: 6   hydrOXYzine (ATARAX/VISTARIL) 10 MG tablet, TAKE 1/2 TO 1 TABLET(5 TO 10 MG) BY MOUTH AT BEDTIME AS NEEDED FOR ITCHING, Disp: 90 tablet, Rfl: 0   lactulose (CHRONULAC) 10 GM/15ML solution, Take 45 mLs (30 g total) by mouth 3 (three) times daily., Disp: 12150 mL, Rfl: 0   meclizine (ANTIVERT) 12.5 MG tablet, TAKE 1 TABLET(12.5 MG) BY MOUTH DAILY AS NEEDED FOR DIZZINESS, Disp: 30 tablet, Rfl: 1   pantoprazole (PROTONIX) 40 MG tablet, Take 1 tablet (40 mg total) by mouth 2 (two) times daily., Disp: 60 tablet, Rfl: 0   rifaximin (XIFAXAN) 550 MG TABS tablet, Take 1 tablet (550 mg total) by mouth 2 (two) times daily., Disp: 180 tablet, Rfl: 0   spironolactone (ALDACTONE) 100 MG tablet, Take 1 tablet (100 mg total) by mouth daily., Disp: 30 tablet, Rfl: 6

## 2021-07-20 NOTE — Patient Instructions (Addendum)
Continue taking Breo Ellipta 1 puff daily  Continue to use albuterol 1-2 puffs every 4-6 hours as needed for cough, shortness of breath and wheezing.  Your pulmonary function tests are normal.  You have mild sleep apnea which we recommend weight loss for. We can consider CPAP therapy in the future.

## 2021-07-22 ENCOUNTER — Other Ambulatory Visit: Payer: Self-pay

## 2021-07-22 MED ORDER — MECLIZINE HCL 12.5 MG PO TABS: ORAL_TABLET | ORAL | 2 refills | Status: DC

## 2021-08-04 ENCOUNTER — Telehealth: Payer: Self-pay | Admitting: Gastroenterology

## 2021-08-04 NOTE — Telephone Encounter (Signed)
Inbound call from patient. Requesting a call back to see if he need blood work or advice to help with his stomach bloating.

## 2021-08-05 NOTE — Telephone Encounter (Signed)
Pt states the he feels that he needs a paracentesis. Pt states that he has had a 12 lb weigh gain in the last two weeks. Pt states that he feels that his clothes are getting tighter and experiencing some shortness of breath. Pt is currently taking Aldactone 100 mg daily and Lasix 20 mg daily. Pt confirmed that he is taking both of these. Please advise

## 2021-08-06 ENCOUNTER — Other Ambulatory Visit: Payer: Self-pay

## 2021-08-06 ENCOUNTER — Telehealth: Payer: Self-pay

## 2021-08-06 DIAGNOSIS — K7031 Alcoholic cirrhosis of liver with ascites: Secondary | ICD-10-CM

## 2021-08-06 NOTE — Telephone Encounter (Signed)
Pt was ordered and scheduled a paracentesis at [facility] on 08/11/2021 at 12:45. Orders for Labs enterered into Epic. Pt made aware and was informed that if  he started having SOB and felt that he needed the procedure earlier please go to the ER.

## 2021-08-11 ENCOUNTER — Ambulatory Visit (HOSPITAL_COMMUNITY)
Admission: RE | Admit: 2021-08-11 | Discharge: 2021-08-11 | Disposition: A | Discharge status: Home / Self Care | Payer: No Typology Code available for payment source | Source: Ambulatory Visit | Attending: Gastroenterology | Admitting: Gastroenterology

## 2021-08-11 ENCOUNTER — Other Ambulatory Visit: Payer: Self-pay | Admitting: Physician Assistant

## 2021-08-11 ENCOUNTER — Other Ambulatory Visit: Payer: Self-pay

## 2021-08-11 DIAGNOSIS — K7031 Alcoholic cirrhosis of liver with ascites: Secondary | ICD-10-CM | POA: Diagnosis present

## 2021-08-11 HISTORY — PX: IR PARACENTESIS: IMG2679

## 2021-08-11 LAB — ALBUMIN, PLEURAL OR PERITONEAL FLUID: Albumin, Fluid: <1 g/dL

## 2021-08-11 LAB — BODY FLUID CELL COUNT WITH DIFFERENTIAL
Eos, Fluid: 0 %
Lymphs, Fluid: 57 %
Monocyte-Macrophage-Serous Fluid: 34 % — ABNORMAL LOW (ref 50–90)
Neutrophil Count, Fluid: 9 % (ref 0–25)
Total Nucleated Cell Count, Fluid: 150 cu mm (ref 0–1000)

## 2021-08-11 LAB — PROTEIN, PLEURAL OR PERITONEAL FLUID: Total protein, fluid: <3 g/dL

## 2021-08-11 MED ORDER — LIDOCAINE HCL (PF) 1 % IJ SOLN
INTRAMUSCULAR | Status: DC | PRN
  Administered 2021-08-11: 10 mL

## 2021-08-11 MED ORDER — LIDOCAINE HCL 1 % IJ SOLN
INTRAMUSCULAR | Status: AC
  Filled 2021-08-11: qty 20

## 2021-08-11 MED ORDER — ALBUMIN HUMAN 25 % IV SOLN
50.0000 g | Freq: Once | INTRAVENOUS | Status: AC
Start: 2021-08-11 — End: 2021-08-11
  Filled 2021-08-11: qty 200

## 2021-08-11 MED ORDER — ALBUMIN HUMAN 25 % IV SOLN
INTRAVENOUS | Status: AC
  Administered 2021-08-11: 50 g via INTRAVENOUS
  Filled 2021-08-11: qty 200

## 2021-08-11 NOTE — Procedures (Signed)
PROCEDURE SUMMARY:  Successful US guided paracentesis from right abdomen.  Yielded 4.6 L of clear yellow fluid.  No immediate complications.  Pt tolerated well.   Specimen sent for labs.  EBL < 1 mL  Theresa Duty, NP 08/11/2021 2:47 PM  -

## 2021-08-12 ENCOUNTER — Ambulatory Visit: Payer: No Typology Code available for payment source | Admitting: Nurse Practitioner

## 2021-08-12 ENCOUNTER — Other Ambulatory Visit: Payer: Self-pay | Admitting: Physician Assistant

## 2021-08-12 LAB — CYTOLOGY - NON PAP

## 2021-08-20 ENCOUNTER — Other Ambulatory Visit: Payer: Self-pay | Admitting: Gastroenterology

## 2021-08-20 DIAGNOSIS — R1013 Epigastric pain: Secondary | ICD-10-CM | POA: Insufficient documentation

## 2021-08-20 DIAGNOSIS — K7031 Alcoholic cirrhosis of liver with ascites: Secondary | ICD-10-CM

## 2021-08-20 DIAGNOSIS — Z719 Counseling, unspecified: Secondary | ICD-10-CM | POA: Insufficient documentation

## 2021-08-20 DIAGNOSIS — K7290 Hepatic failure, unspecified without coma: Secondary | ICD-10-CM

## 2021-08-20 DIAGNOSIS — R252 Cramp and spasm: Secondary | ICD-10-CM | POA: Insufficient documentation

## 2021-08-20 DIAGNOSIS — K766 Portal hypertension: Secondary | ICD-10-CM

## 2021-08-20 DIAGNOSIS — Z5181 Encounter for therapeutic drug level monitoring: Secondary | ICD-10-CM | POA: Insufficient documentation

## 2021-08-20 DIAGNOSIS — K7460 Unspecified cirrhosis of liver: Secondary | ICD-10-CM

## 2021-08-20 DIAGNOSIS — K219 Gastro-esophageal reflux disease without esophagitis: Secondary | ICD-10-CM | POA: Insufficient documentation

## 2021-08-20 DIAGNOSIS — K7682 Hepatic encephalopathy: Secondary | ICD-10-CM

## 2021-08-20 DIAGNOSIS — F419 Anxiety disorder, unspecified: Secondary | ICD-10-CM | POA: Insufficient documentation

## 2021-08-20 DIAGNOSIS — F339 Major depressive disorder, recurrent, unspecified: Secondary | ICD-10-CM | POA: Insufficient documentation

## 2021-08-21 ENCOUNTER — Ambulatory Visit: Payer: No Typology Code available for payment source | Admitting: Registered Nurse

## 2021-08-22 ENCOUNTER — Inpatient Hospital Stay (HOSPITAL_COMMUNITY)
Admission: EM | Admit: 2021-08-22 | Discharge: 2021-08-25 | DRG: 645 | Disposition: A | Discharge status: Home / Self Care | Payer: No Typology Code available for payment source | Attending: Internal Medicine | Admitting: Internal Medicine

## 2021-08-22 ENCOUNTER — Emergency Department (HOSPITAL_COMMUNITY): Payer: No Typology Code available for payment source

## 2021-08-22 ENCOUNTER — Other Ambulatory Visit: Payer: Self-pay

## 2021-08-22 ENCOUNTER — Encounter (HOSPITAL_COMMUNITY): Payer: Self-pay

## 2021-08-22 DIAGNOSIS — E875 Hyperkalemia: Secondary | ICD-10-CM | POA: Diagnosis not present

## 2021-08-22 DIAGNOSIS — Z8616 Personal history of COVID-19: Secondary | ICD-10-CM

## 2021-08-22 DIAGNOSIS — F329 Major depressive disorder, single episode, unspecified: Secondary | ICD-10-CM | POA: Diagnosis present

## 2021-08-22 DIAGNOSIS — J4530 Mild persistent asthma, uncomplicated: Secondary | ICD-10-CM | POA: Diagnosis present

## 2021-08-22 DIAGNOSIS — Z79899 Other long term (current) drug therapy: Secondary | ICD-10-CM | POA: Diagnosis not present

## 2021-08-22 DIAGNOSIS — J45909 Unspecified asthma, uncomplicated: Secondary | ICD-10-CM

## 2021-08-22 DIAGNOSIS — Z20822 Contact with and (suspected) exposure to covid-19: Secondary | ICD-10-CM | POA: Diagnosis present

## 2021-08-22 DIAGNOSIS — E871 Hypo-osmolality and hyponatremia: Secondary | ICD-10-CM | POA: Diagnosis present

## 2021-08-22 DIAGNOSIS — F419 Anxiety disorder, unspecified: Secondary | ICD-10-CM | POA: Diagnosis present

## 2021-08-22 DIAGNOSIS — K7210 Chronic hepatic failure without coma: Secondary | ICD-10-CM

## 2021-08-22 DIAGNOSIS — Z885 Allergy status to narcotic agent status: Secondary | ICD-10-CM | POA: Diagnosis not present

## 2021-08-22 DIAGNOSIS — E222 Syndrome of inappropriate secretion of antidiuretic hormone: Principal | ICD-10-CM | POA: Diagnosis present

## 2021-08-22 DIAGNOSIS — J4540 Moderate persistent asthma, uncomplicated: Secondary | ICD-10-CM

## 2021-08-22 DIAGNOSIS — Z833 Family history of diabetes mellitus: Secondary | ICD-10-CM

## 2021-08-22 DIAGNOSIS — R188 Other ascites: Secondary | ICD-10-CM | POA: Diagnosis present

## 2021-08-22 DIAGNOSIS — K7031 Alcoholic cirrhosis of liver with ascites: Secondary | ICD-10-CM | POA: Diagnosis present

## 2021-08-22 DIAGNOSIS — K7460 Unspecified cirrhosis of liver: Secondary | ICD-10-CM

## 2021-08-22 HISTORY — DX: Unspecified asthma, uncomplicated: J45.909

## 2021-08-22 LAB — COMPREHENSIVE METABOLIC PANEL
ALT: 60 U/L — ABNORMAL HIGH (ref 0–44)
ALT: 51 U/L — ABNORMAL HIGH (ref 0–44)
AST: 207 U/L — ABNORMAL HIGH (ref 15–41)
AST: 176 U/L — ABNORMAL HIGH (ref 15–41)
Albumin: 2.7 g/dL — ABNORMAL LOW (ref 3.5–5.0)
Albumin: 2.6 g/dL — ABNORMAL LOW (ref 3.5–5.0)
Alkaline Phosphatase: 331 U/L — ABNORMAL HIGH (ref 38–126)
Alkaline Phosphatase: 286 U/L — ABNORMAL HIGH (ref 38–126)
Anion gap: 5 (ref 5–15)
Anion gap: 7 (ref 5–15)
BUN: 17 mg/dL (ref 6–20)
BUN: 16 mg/dL (ref 6–20)
CO2: 22 mmol/L (ref 22–32)
CO2: 21 mmol/L — ABNORMAL LOW (ref 22–32)
Calcium: 8.7 mg/dL — ABNORMAL LOW (ref 8.9–10.3)
Calcium: 8.2 mg/dL — ABNORMAL LOW (ref 8.9–10.3)
Chloride: 91 mmol/L — ABNORMAL LOW (ref 98–111)
Chloride: 86 mmol/L — ABNORMAL LOW (ref 98–111)
Creatinine, Ser: 0.75 mg/dL (ref 0.61–1.24)
Creatinine, Ser: 0.73 mg/dL (ref 0.61–1.24)
GFR, Estimated: >60 mL/min (ref >60)
GFR, Estimated: >60 mL/min (ref >60)
Glucose, Bld: 118 mg/dL — ABNORMAL HIGH (ref 70–99)
Glucose, Bld: 113 mg/dL — ABNORMAL HIGH (ref 70–99)
Potassium: 5.9 mmol/L — ABNORMAL HIGH (ref 3.5–5.1)
Potassium: 4.6 mmol/L (ref 3.5–5.1)
Sodium: 118 mmol/L — CL (ref 135–145)
Sodium: 114 mmol/L — CL (ref 135–145)
Total Bilirubin: 12.4 mg/dL — ABNORMAL HIGH (ref 0.3–1.2)
Total Bilirubin: 10.5 mg/dL — ABNORMAL HIGH (ref 0.3–1.2)
Total Protein: 5.6 g/dL — ABNORMAL LOW (ref 6.5–8.1)
Total Protein: 5.1 g/dL — ABNORMAL LOW (ref 6.5–8.1)

## 2021-08-22 LAB — RAPID URINE DRUG SCREEN, HOSP PERFORMED
Amphetamines: NOT DETECTED
Barbiturates: NOT DETECTED
Benzodiazepines: NOT DETECTED
Cocaine: NOT DETECTED
Opiates: NOT DETECTED
Tetrahydrocannabinol: NOT DETECTED

## 2021-08-22 LAB — BASIC METABOLIC PANEL
Anion gap: 4 — ABNORMAL LOW (ref 5–15)
BUN: 16 mg/dL (ref 6–20)
CO2: 20 mmol/L — ABNORMAL LOW (ref 22–32)
Calcium: 8 mg/dL — ABNORMAL LOW (ref 8.9–10.3)
Chloride: 94 mmol/L — ABNORMAL LOW (ref 98–111)
Creatinine, Ser: 0.55 mg/dL — ABNORMAL LOW (ref 0.61–1.24)
GFR, Estimated: >60 mL/min (ref >60)
Glucose, Bld: 102 mg/dL — ABNORMAL HIGH (ref 70–99)
Potassium: 5.8 mmol/L — ABNORMAL HIGH (ref 3.5–5.1)
Sodium: 118 mmol/L — CL (ref 135–145)

## 2021-08-22 LAB — CBC
HCT: 31.4 % — ABNORMAL LOW (ref 39.0–52.0)
Hemoglobin: 11.5 g/dL — ABNORMAL LOW (ref 13.0–17.0)
MCH: 33.8 pg (ref 26.0–34.0)
MCHC: 36.6 g/dL — ABNORMAL HIGH (ref 30.0–36.0)
MCV: 92.4 fL (ref 80.0–100.0)
Platelets: 62 10*3/uL — ABNORMAL LOW (ref 150–400)
RBC: 3.4 MIL/uL — ABNORMAL LOW (ref 4.22–5.81)
RDW: 15.8 % — ABNORMAL HIGH (ref 11.5–15.5)
WBC: 7.1 10*3/uL (ref 4.0–10.5)
nRBC: 0 % (ref 0.0–0.2)

## 2021-08-22 LAB — OSMOLALITY, URINE: Osmolality, Ur: 769 mOsm/kg (ref 300–900)

## 2021-08-22 LAB — URINALYSIS, ROUTINE W REFLEX MICROSCOPIC
Bacteria, UA: NONE SEEN
Glucose, UA: NEGATIVE mg/dL
Ketones, ur: NEGATIVE mg/dL
Leukocytes,Ua: NEGATIVE
Nitrite: NEGATIVE
Protein, ur: NEGATIVE mg/dL
Specific Gravity, Urine: 1.025 (ref 1.005–1.030)
pH: 6 (ref 5.0–8.0)

## 2021-08-22 LAB — RESP PANEL BY RT-PCR (FLU A&B, COVID) ARPGX2
Influenza A by PCR: NEGATIVE
Influenza B by PCR: NEGATIVE
SARS Coronavirus 2 by RT PCR: NEGATIVE

## 2021-08-22 LAB — SODIUM
Sodium: 114 mmol/L — CL (ref 135–145)
Sodium: 115 mmol/L — CL (ref 135–145)

## 2021-08-22 LAB — AMMONIA: Ammonia: 44 umol/L — ABNORMAL HIGH (ref 9–35)

## 2021-08-22 LAB — SODIUM, URINE, RANDOM: Sodium, Ur: 55 mmol/L

## 2021-08-22 LAB — LIPASE, BLOOD: Lipase: 48 U/L (ref 11–51)

## 2021-08-22 LAB — PROTIME-INR
INR: 1.8 — ABNORMAL HIGH (ref 0.8–1.2)
Prothrombin Time: 20.5 seconds — ABNORMAL HIGH (ref 11.4–15.2)

## 2021-08-22 LAB — ETHANOL: Alcohol, Ethyl (B): <10 mg/dL (ref <10)

## 2021-08-22 LAB — OSMOLALITY: Osmolality: 244 mOsm/kg — CL (ref 275–295)

## 2021-08-22 MED ORDER — FOLIC ACID 1 MG PO TABS
1.0000 mg | ORAL_TABLET | Freq: Every day | ORAL | Status: DC
  Administered 2021-08-23 – 2021-08-25 (×3): 1 mg via ORAL
  Filled 2021-08-22 (×3): qty 1

## 2021-08-22 MED ORDER — FLUTICASONE FUROATE-VILANTEROL 100-25 MCG/INH IN AEPB
1.0000 | INHALATION_SPRAY | Freq: Every day | RESPIRATORY_TRACT | Status: DC
  Administered 2021-08-23 – 2021-08-25 (×3): 1 via RESPIRATORY_TRACT
  Filled 2021-08-22 (×2): qty 28

## 2021-08-22 MED ORDER — SODIUM CHLORIDE 0.9 % IV SOLN
2.0000 g | Freq: Every day | INTRAVENOUS | Status: DC
  Administered 2021-08-22: 2 g via INTRAVENOUS
  Filled 2021-08-22 (×2): qty 20

## 2021-08-22 MED ORDER — LACTULOSE 10 GM/15ML PO SOLN: 10.0000 g | Freq: Three times a day (TID) | ORAL | Status: DC | PRN

## 2021-08-22 MED ORDER — ALBUMIN HUMAN 25 % IV SOLN
25.0000 g | Freq: Three times a day (TID) | INTRAVENOUS | Status: AC
Start: 2021-08-22 — End: 2021-08-23
  Administered 2021-08-22 – 2021-08-23 (×3): 25 g via INTRAVENOUS
  Filled 2021-08-22 (×3): qty 100

## 2021-08-22 MED ORDER — ONDANSETRON HCL 4 MG/2ML IJ SOLN
4.0000 mg | Freq: Four times a day (QID) | INTRAMUSCULAR | Status: DC | PRN
  Administered 2021-08-23: 4 mg via INTRAVENOUS
  Filled 2021-08-22: qty 2

## 2021-08-22 MED ORDER — SODIUM CHLORIDE 3 % IV SOLN
INTRAVENOUS | Status: DC
  Filled 2021-08-22: qty 500

## 2021-08-22 MED ORDER — ONDANSETRON HCL 4 MG PO TABS: 4.0000 mg | ORAL_TABLET | Freq: Four times a day (QID) | ORAL | Status: DC | PRN

## 2021-08-22 MED ORDER — ALBUTEROL SULFATE (2.5 MG/3ML) 0.083% IN NEBU: 2.5000 mg | INHALATION_SOLUTION | Freq: Four times a day (QID) | RESPIRATORY_TRACT | Status: DC | PRN

## 2021-08-22 MED ORDER — DICYCLOMINE HCL 10 MG PO CAPS
10.0000 mg | ORAL_CAPSULE | Freq: Three times a day (TID) | ORAL | Status: DC
  Administered 2021-08-22 – 2021-08-25 (×12): 10 mg via ORAL
  Filled 2021-08-22 (×14): qty 1

## 2021-08-22 MED ORDER — SERTRALINE HCL 50 MG PO TABS: 50.0000 mg | ORAL_TABLET | Freq: Every day | ORAL | Status: DC

## 2021-08-22 MED ORDER — CHLORHEXIDINE GLUCONATE CLOTH 2 % EX PADS
6.0000 | MEDICATED_PAD | Freq: Every day | CUTANEOUS | Status: DC
  Administered 2021-08-23 – 2021-08-24 (×2): 6 via TOPICAL

## 2021-08-22 MED ORDER — PANTOPRAZOLE SODIUM 40 MG PO TBEC
40.0000 mg | DELAYED_RELEASE_TABLET | Freq: Two times a day (BID) | ORAL | Status: DC
  Administered 2021-08-22 – 2021-08-25 (×6): 40 mg via ORAL
  Filled 2021-08-22 (×6): qty 1

## 2021-08-22 MED ORDER — SODIUM ZIRCONIUM CYCLOSILICATE 10 G PO PACK
10.0000 g | PACK | Freq: Three times a day (TID) | ORAL | Status: DC
  Administered 2021-08-22: 10 g via ORAL
  Filled 2021-08-22: qty 1

## 2021-08-22 MED ORDER — RIFAXIMIN 550 MG PO TABS
1100.0000 mg | ORAL_TABLET | Freq: Two times a day (BID) | ORAL | Status: DC
  Administered 2021-08-22 – 2021-08-25 (×6): 1100 mg via ORAL
  Filled 2021-08-22 (×7): qty 2

## 2021-08-22 MED ORDER — SODIUM ZIRCONIUM CYCLOSILICATE 5 G PO PACK
5.0000 g | PACK | Freq: Once | ORAL | Status: AC
  Administered 2021-08-22: 5 g via ORAL
  Filled 2021-08-22: qty 1

## 2021-08-22 MED ORDER — SODIUM CHLORIDE 3 % IV SOLN
INTRAVENOUS | Status: DC
  Filled 2021-08-22 (×6): qty 500

## 2021-08-22 NOTE — ED Provider Notes (Signed)
[facility] DEPT Provider Note   CSN: [id number] Arrival date & time: 08/22/21  1230     History Chief Complaint  Patient presents with   Abdominal Pain   Dizziness    Ian Hall is a 55 y.o. male.  HPI He presents for evaluation abdominal pain with sensation of bloating.  Has also been vomiting for several days.  He has ascites and recently had a paracentesis.  He had a initial consult, with palliative care medicine, on 08/20/2021.  He has decompensated alcoholic cirrhosis and history of hepatic encephalopathy, ascites and splenomegaly.  In [facility], the palliative consultation included starting sertraline, 50 mg a day for major depression.  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  The consultation was in [place], [place].  The patient states he is here because of general weakness and sleepiness.  He states he is dizzy, on and off.  He denies fever, chills, cough, shortness of breath.  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   Thrombocytopenia ([place]) 01/07/2021   Alcohol abuse, episodic 47/82/9562   Alcoholic hepatitis 13/06/6577   Cirrhosis of liver with ascites ([place])    Hyperbilirubinemia    Alcoholic hepatitis with ascites 12/26/2020   Hepatitis, alcoholic, acute 46/96/2952   Other viral warts 03/22/2018    Past Surgical History:  Procedure Laterality Date   ANKLE SURGERY Right    IR PARACENTESIS  01/07/2021   IR  PARACENTESIS  01/30/2021   IR PARACENTESIS  05/12/2021   IR PARACENTESIS  05/27/2021   IR PARACENTESIS  08/11/2021   IR TRANSCATHETER BX  01/09/2021   IR US GUIDE VASC ACCESS RIGHT  01/09/2021   IR VENOGRAM HEPATIC W HEMODYNAMIC EVALUATION  01/09/2021       Family History  Problem Relation Age of Onset   Diabetes Mother    Memory loss Mother    Diabetes Father    Diabetes Cousin    Colon cancer Neg Hx     Social History   Tobacco Use   Smoking status: Former    Packs/day: 0.50    Years: 23.00    Pack years: 11.50    Types: Cigarettes    Start date: 59    Quit date: 12/16/2002    Years since quitting: 18.6   Smokeless tobacco: Never  Vaping Use   Vaping Use: Never used  Substance Use Topics   Alcohol use: Not Currently    Comment: none for 3-4 years (stated 12/16/2020)   Drug use: No    Home Medications Prior to Admission medications   Medication Sig Start Date End Date Taking? Authorizing Provider  albuterol (VENTOLIN HFA) 108 (90 Base) MCG/ACT inhaler INHALE 2 PUFFS INTO THE LUNGS EVERY 6 HOURS AS NEEDED FOR WHEEZING OR SHORTNESS OF BREATH 04/28/21   Maximiano Coss, NP  dicyclomine (BENTYL) 10 MG capsule TAKE 1 CAPSULE BY MOUTH 4 TIMES A DAY BEFORE MEALS AND AT BEDTIME  08/11/21   Esterwood, Amy S, PA-C  fluticasone furoate-vilanterol (BREO ELLIPTA) 100-25 MCG/INH AEPB Inhale 1 puff into the lungs daily. 06/03/21   Freddi Starr, MD  folic acid (FOLVITE) 1 MG tablet TAKE 1 TABLET BY MOUTH EVERY DAY 08/12/21   Esterwood, Amy S, PA-C  furosemide (LASIX) 20 MG tablet Take 1 tablet (20 mg total) by mouth daily. 05/21/21 06/25/22  Thornton Park, MD  hydrOXYzine (ATARAX/VISTARIL) 10 MG tablet TAKE 1/2 TO 1 TABLET(5 TO 10 MG) BY MOUTH AT BEDTIME AS NEEDED FOR ITCHING 05/15/21   Maximiano Coss, NP  meclizine (ANTIVERT) 12.5 MG tablet TAKE 1 TABLET(12.5 MG) BY MOUTH DAILY AS NEEDED FOR DIZZINESS 07/22/21   Esterwood, Amy S, PA-C  pantoprazole (PROTONIX) 40 MG tablet Take 1 tablet  (40 mg total) by mouth 2 (two) times daily. 05/11/21 06/25/22  Esterwood, Amy S, PA-C  spironolactone (ALDACTONE) 100 MG tablet TAKE 1 TABLET BY MOUTH EVERY DAY 08/20/21   Thornton Park, MD  XIFAXAN 550 MG TABS tablet TAKE 2 TABLETS BY MOUTH TWICE A DAY 08/12/21   Esterwood, Amy S, PA-C    Allergies    Hydrocodone  Review of Systems   Review of Systems  All other systems reviewed and are negative.  Physical Exam Updated Vital Signs BP 132/79 (BP Location: Right Arm)   Pulse 76   Temp 98.4 F (36.9 C) (Oral)   Resp 16   Ht _0  (1.702 m)   Wt 102.1 kg   SpO2 100%   BMI 35.24 kg/m   Physical Exam Vitals and nursing note reviewed.  Constitutional:      General: He is not in acute distress.    Appearance: He is well-developed. He is ill-appearing. He is not toxic-appearing or diaphoretic.  HENT:     Head: Normocephalic and atraumatic.     Right Ear: External ear normal.     Left Ear: External ear normal.  Eyes:     General: Scleral icterus present.     Conjunctiva/sclera: Conjunctivae normal.     Pupils: Pupils are equal, round, and reactive to light.  Neck:     Trachea: Phonation normal.  Cardiovascular:     Rate and Rhythm: Normal rate.  Pulmonary:     Effort: Pulmonary effort is normal. No respiratory distress.     Breath sounds: No stridor.  Abdominal:     General: There is distension.     Palpations: Abdomen is soft.  Musculoskeletal:        General: Normal range of motion.     Cervical back: Normal range of motion and neck supple.     Right lower leg: Edema present.     Left lower leg: Edema present.  Skin:    General: Skin is warm and dry.  Neurological:     Mental Status: He is alert and oriented to person, place, and time.     Cranial Nerves: No cranial nerve deficit.     Sensory: No sensory deficit.     Motor: No abnormal muscle tone.     Coordination: Coordination normal.  Psychiatric:        Mood and Affect: Mood normal.        Behavior:  Behavior normal.        Thought Content: Thought content normal.        Judgment: Judgment normal.    ED Results / Procedures / Treatments   Labs (all labs ordered are listed, but only abnormal results are displayed) Labs Reviewed  COMPREHENSIVE METABOLIC PANEL - Abnormal; Notable for the following components:      Result Value   Sodium 118 (*)    Potassium 5.9 (*)    Chloride 91 (*)    Glucose, Bld 118 (*)    Calcium 8.7 (*)    Total Protein 5.6 (*)    Albumin 2.7 (*)    AST 207 (*)    ALT 60 (*)    Alkaline Phosphatase 331 (*)    Total Bilirubin 12.4 (*)    All other components within normal limits  CBC - Abnormal; Notable for the following components:   RBC 3.40 (*)    Hemoglobin 11.5 (*)    HCT 31.4 (*)    MCHC 36.6 (*)    RDW 15.8 (*)    Platelets 62 (*)    All other components within normal limits  AMMONIA - Abnormal; Notable for the following components:   Ammonia 44 (*)    All other components within normal limits  RESP PANEL BY RT-PCR (FLU A&B, COVID) ARPGX2  LIPASE, BLOOD  ETHANOL  URINALYSIS, ROUTINE W REFLEX MICROSCOPIC  RAPID URINE DRUG SCREEN, HOSP PERFORMED  BASIC METABOLIC PANEL  SODIUM  SODIUM  OSMOLALITY  OSMOLALITY, URINE  SODIUM, URINE, RANDOM    EKG None  Radiology DG Chest Port 1 View  Result Date: 08/22/2021 CLINICAL DATA:  Shortness of breath EXAM: PORTABLE CHEST 1 VIEW COMPARISON:  02/26/2021 FINDINGS: The heart size and mediastinal contours are within normal limits. Both lungs are clear. The visualized skeletal structures are unremarkable. IMPRESSION: No acute abnormality of the lungs in AP portable projection. Electronically Signed   By: Eddie Candle M.D.   On: 08/22/2021 14:54    Procedures .Critical Care Performed by: Daleen Bo, MD Authorized by: Daleen Bo, MD   Critical care provider statement:    Critical care time (minutes):  45   Critical care start time:  08/22/2021 12:50 PM   Critical care end time:  08/22/2021  3:38 PM   Critical care time was exclusive of:  Separately billable procedures and treating other patients   Critical care was necessary to treat or prevent imminent or life-threatening deterioration of the following conditions:  Metabolic crisis   Critical care was time spent personally by me on the following activities:  Blood draw for specimens, development of treatment plan with patient or surrogate, discussions with consultants, evaluation of patient's response to treatment, examination of patient, obtaining history from patient or surrogate, ordering and performing treatments and interventions, ordering and review of laboratory studies, pulse oximetry, re-evaluation of patient's condition, review of old charts and ordering and review of radiographic studies   Medications Ordered in ED Medications  sodium chloride (hypertonic) 3 % solution (has no administration in time range)  sodium zirconium cyclosilicate (LOKELMA) packet 5 g (5 g Oral Given 08/22/21 1509)    ED Course  I have reviewed the triage vital signs and the nursing notes.  Pertinent labs & imaging results that were available during my care of the patient were reviewed by me and considered in my medical decision making (see chart for details).    MDM Rules/Calculators/A&P                            Patient Vitals for the past 24 hrs:  BP Temp Temp src Pulse Resp SpO2 Height Weight  08/22/21 1244 -- -- -- -- -- -- _0  (1.702 m) 102.1 kg  08/22/21 1239 132/79 98.4 F (36.9 C) Oral 76 16 100 % -- --    3:18 PM Reevaluation with update and discussion. After initial assessment and treatment, an updated evaluation reveals he remains comfortable and alert.  5 discussed with patient and his wife, all questions were answered. Daleen Bo   Medical Decision Making:  This patient is presenting for evaluation of vague symptoms, onset several days ago and persistent, which does require a range of treatment options, and is a  complaint that involves a moderate risk of morbidity and mortality. The differential diagnoses include worsening chronic liver disease, acute metabolic disorder, depression. I decided to review old records, and in summary millage male, with end-stage liver disease, reportedly on a transplant list, presenting with nonspecific symptoms.  Last meld score was 27.  He missed follow-up appointments with hepatologist, yesterday because he was feeling better.  He had a paracentesis done about 2 weeks ago, and usually gets them every 3 months.  I did not require additional historical information from anyone.  Clinical Laboratory Tests Ordered, included CBC, Metabolic panel, Urinalysis, and ammonia, lipase, alcohol level . Review indicates no except hemoglobin low, ammonia high, sodium low, potassium high, chloride low, glucose high, calcium low, total protein low, albumin low, AST high, ALT high, alk phos stays high, total bilirubin high.   Critical Interventions-clinical evaluation, laboratory testing, observation, additional medication, concentrated saline ordered for hyponatremia.  Incidental hyperkalemia, with normal creatinine.  Lokelma added for hyperkalemia.  After These Interventions, the Patient was reevaluated and was found critically ill with mild neurologic symptoms, requiring treatment of hyponatremia with concentrated saline.  Patient will require close observation and treatment.  CRITICAL CARE-no Performed by: Daleen Bo  Nursing Notes Reviewed/ Care Coordinated Applicable Imaging Reviewed Interpretation of Laboratory Data incorporated into ED treatment  3:21 PM-Consult complete with hospitalist. Patient case explained and discussed.  He agrees to admit patient for further evaluation and treatment. Call ended at 3:32 PM    Final Clinical Impression(s) / ED Diagnoses Final diagnoses:  Hyponatremia  Hyperkalemia  End stage liver disease ([place])    Rx / DC Orders ED Discharge  Orders     None        Daleen Bo, MD 08/22/21 1538

## 2021-08-22 NOTE — Progress Notes (Signed)
Made aware by RN that pt sodium levels dropped to 114. Contacted nephrology for consult to start 3% on pt. Nephrology, Dr Jonnie Finner, in agreement with restarting 3% at this time. Pt to transfer to SDU/ICU for closer monitoring and management of hyponatremia.   Lovey Newcomer, NP [facility] 7p-7a [phone]

## 2021-08-22 NOTE — Progress Notes (Signed)
Spoke to pharmacist about missing medication, they will tube shortly.

## 2021-08-22 NOTE — Plan of Care (Signed)
  Problem: Education: Goal: Knowledge of General Education information will improve Description: Including pain rating scale, medication(s)/side effects and non-pharmacologic comfort measures Outcome: Progressing   Problem: Health Behavior/Discharge Planning: Goal: Ability to manage health-related needs will improve Outcome: Progressing   Problem: Clinical Measurements: Goal: Will remain free from infection Outcome: Progressing Goal: Diagnostic test results will improve Outcome: Progressing Goal: Cardiovascular complication will be avoided Outcome: Progressing   Problem: Activity: Goal: Risk for activity intolerance will decrease Outcome: Progressing   Problem: Safety: Goal: Ability to remain free from injury will improve Outcome: Progressing

## 2021-08-22 NOTE — ED Triage Notes (Addendum)
Patient c/o generalized abdominal pain, bloating, dizziness, and emesis x 2 days. Patient states he had a paracentesis 2 weeks ago.

## 2021-08-22 NOTE — H&P (Signed)
History and Physical  Patient Name: Ian Hall     ZOX:[id number]    DOB: 05-25-1966    DOA: 08/22/2021 PCP: Maximiano Coss, NP  Patient coming from: Home  Chief Complaint: Generalized weakness    HPI: Ian Hall is a 55 y.o. male, with PMH of alcoholic cirrhosis, anxiety, chronic hyponatremia, mild persistent reactive airway disease, who presented to the ER on 08/22/2021 with generalized weakness.  Patient states over the past few days he has felt weak.  He has had issues with his gait and dizziness.  Admits to feeling "drunk".  Denies any recent alcohol use.  He quit drinking about 10 years ago but did have a relapse last December and ended up being admitted for decompensated cirrhosis.  He has had multiple ER visits over the past year however last one was 4 months ago for similar presentation, hyponatremia, weakness.  Apparently has been followed by liver transplant team in [place].  He recently was seen by palliative care.  He last had a paracentesis on 08/11/2021, 4.7 L removed.  He believes he has been urinating more lately.  He says his appetite has not been as great lately.    ED course: -Vitals on admission: Afebrile, heart rate 76, blood pressure 132/79, maintaining sats on room air -Labs on initial presentation: Sodium 118, potassium 5.9, chloride 91, bicarb 22, glucose 118, BUN 17, creatinine 0.75, calcium 8.7, albumin 2.7, AST 207, ALT 60, ammonia 44, total bilirubin 12.4, WBC 7.1, hemoglobin 11.5, platelets 62 -Imaging obtained on admission: Chest x-ray was unremarkable -In the ED the patient was given [facility], started on 3%, and the hospitalist service was contacted for further evaluation and management.     ROS: A complete and thorough 12 point review of systems obtained, negative listed in HPI.     Past Medical History:  Diagnosis Date   Anxiety    Cirrhosis ([place])    COVID-19     Past Surgical History:  Procedure Laterality Date   ANKLE SURGERY Right     IR PARACENTESIS  01/07/2021   IR PARACENTESIS  01/30/2021   IR PARACENTESIS  05/12/2021   IR PARACENTESIS  05/27/2021   IR PARACENTESIS  08/11/2021   IR TRANSCATHETER BX  01/09/2021   IR US GUIDE VASC ACCESS RIGHT  01/09/2021   IR VENOGRAM HEPATIC W HEMODYNAMIC EVALUATION  01/09/2021    Social History: Patient lives at home.  The patient walks without assistance.  nonsmoker.  Allergies  Allergen Reactions   Hydrocodone Itching and Other (See Comments)    Noted with hydrocodone cough syrup. Itching only, no hives.  02/03/16: denied itching with recent hydrocodone cough syrup. Only one episode of itching previously.     Family history: family history includes Diabetes in his cousin, father, and mother; Memory loss in his mother.  Prior to Admission medications   Medication Sig Start Date End Date Taking? Authorizing Provider  albuterol (VENTOLIN HFA) 108 (90 Base) MCG/ACT inhaler INHALE 2 PUFFS INTO THE LUNGS EVERY 6 HOURS AS NEEDED FOR WHEEZING OR SHORTNESS OF BREATH 04/28/21  Yes Maximiano Coss, NP  bethanechol (URECHOLINE) 10 MG tablet Take 10 mg by mouth 3 (three) times daily. 08/20/21  Yes [provider]  dicyclomine (BENTYL) 10 MG capsule TAKE 1 CAPSULE BY MOUTH 4 TIMES A DAY BEFORE MEALS AND AT BEDTIME 08/11/21  Yes Esterwood, Amy S, PA-C  fluticasone furoate-vilanterol (BREO ELLIPTA) 100-25 MCG/INH AEPB Inhale 1 puff into the lungs daily. 06/03/21  Yes Freddi Starr,  MD  folic acid (FOLVITE) 1 MG tablet TAKE 1 TABLET BY MOUTH EVERY DAY 08/12/21  Yes Esterwood, Amy S, PA-C  furosemide (LASIX) 20 MG tablet Take 1 tablet (20 mg total) by mouth daily. 05/21/21 06/25/22 Yes Thornton Park, MD  hydrOXYzine (ATARAX/VISTARIL) 10 MG tablet TAKE 1/2 TO 1 TABLET(5 TO 10 MG) BY MOUTH AT BEDTIME AS NEEDED FOR ITCHING 05/15/21  Yes Maximiano Coss, NP  lactulose (CHRONULAC) 10 GM/15ML solution Take 10 g by mouth 3 (three) times daily as needed for mild constipation.   Yes [provider]   meclizine (ANTIVERT) 12.5 MG tablet TAKE 1 TABLET(12.5 MG) BY MOUTH DAILY AS NEEDED FOR DIZZINESS 07/22/21  Yes Esterwood, Amy S, PA-C  pantoprazole (PROTONIX) 40 MG tablet Take 1 tablet (40 mg total) by mouth 2 (two) times daily. 05/11/21 06/25/22 Yes Esterwood, Amy S, PA-C  sertraline (ZOLOFT) 50 MG tablet Take 50 mg by mouth daily. 08/20/21  Yes [provider]  spironolactone (ALDACTONE) 100 MG tablet TAKE 1 TABLET BY MOUTH EVERY DAY 08/20/21  Yes Thornton Park, MD  XIFAXAN 550 MG TABS tablet TAKE 2 TABLETS BY MOUTH TWICE A DAY 08/12/21  Yes Esterwood, Amy S, PA-C       Physical Exam: BP 128/84 (BP Location: Right Arm)   Pulse 70   Temp 98.4 F (36.9 C) (Oral)   Resp 18   Ht 5' 7"  (1.702 m)   Wt 102.1 kg   SpO2 100%   BMI 35.24 kg/m   General appearance: Well-developed, adult male, alert and in no acute distress .   Eyes: Scleral icterus conjunctiva pink, lids and lashes normal. PERRL.    ENT: No nasal deformity, discharge, epistaxis.  Hearing intact. OP moist without lesions.   Neck: No neck masses.  Trachea midline.  No thyromegaly/tenderness. Lymph: No cervical or supraclavicular lymphadenopathy. Skin: Warm and dry.  No jaundice.  No suspicious rashes or lesions. Cardiac: RRR, nl S1-S2, no murmurs appreciated.  trace LE edema.  Radial and pedal pulses 2+ and symmetric. Respiratory: Normal respiratory rate and rhythm.  CTAB without rales or wheezes. Abdomen: Distended, nontense, nontender MSK: No deformities or effusions of the large joints of the upper or lower extremities bilaterally.  No cyanosis or clubbing. Neuro: Cranial nerves 2 through 12 grossly intact.  Sensation intact to light touch. Speech is fluent.  [name]    Psych: Sensorium intact and responding to questions, attention normal.  Behavior appropriate.  Judgment and insight appear normal.    Labs on Admission:  I have personally reviewed following labs and imaging studies: CBC: Recent Labs  Lab  08/22/21 1246  WBC 7.1  HGB 11.5*  HCT 31.4*  MCV 92.4  PLT 62*   Basic Metabolic Panel: Recent Labs  Lab 08/22/21 1246 08/22/21 1500  NA 118* 118*  K 5.9* 5.8*  CL 91* 94*  CO2 22 20*  GLUCOSE 118* 102*  BUN 17 16  CREATININE 0.75 0.55*  CALCIUM 8.7* 8.0*   GFR: Estimated Creatinine Clearance: 120.2 mL/min (A) (by C-G formula based on SCr of 0.55 mg/dL (L)).  Liver Function Tests: Recent Labs  Lab 08/22/21 1246  AST 207*  ALT 60*  ALKPHOS 331*  BILITOT 12.4*  PROT 5.6*  ALBUMIN 2.7*   Recent Labs  Lab 08/22/21 1246  LIPASE 48   Recent Labs  Lab 08/22/21 1416  AMMONIA 44*   Coagulation Profile: No results for input(s): INR, PROTIME in the last 168 hours. Cardiac Enzymes: No results for input(s): CKTOTAL,  CKMB, CKMBINDEX, TROPONINI in the last 168 hours. BNP (last 3 results) No results for input(s): PROBNP in the last 8760 hours. HbA1C: No results for input(s): HGBA1C in the last 72 hours. CBG: No results for input(s): GLUCAP in the last 168 hours. Lipid Profile: No results for input(s): CHOL, HDL, LDLCALC, TRIG, CHOLHDL, LDLDIRECT in the last 72 hours. Thyroid Function Tests: No results for input(s): TSH, T4TOTAL, FREET4, T3FREE, THYROIDAB in the last 72 hours. Anemia Panel: No results for input(s): VITAMINB12, FOLATE, FERRITIN, TIBC, IRON, RETICCTPCT in the last 72 hours.   Recent Results (from the past 240 hour(s))  Resp Panel by RT-PCR (Flu A&B, Covid) Nasopharyngeal Swab     Status: None   Collection Time: 08/22/21  3:42 PM   Specimen: Nasopharyngeal Swab; Nasopharyngeal(NP) swabs in vial transport medium  Result Value Ref Range Status   SARS Coronavirus 2 by RT PCR NEGATIVE NEGATIVE Final    Comment: (NOTE) SARS-CoV-2 target nucleic acids are NOT DETECTED.  The SARS-CoV-2 RNA is generally detectable in upper respiratory specimens during the acute phase of infection. The lowest concentration of SARS-CoV-2 viral copies this assay can  detect is 138 copies/mL. A negative result does not preclude SARS-Cov-2 infection and should not be used as the sole basis for treatment or other patient management decisions. A negative result may occur with  improper specimen collection/handling, submission of specimen other than nasopharyngeal swab, presence of viral mutation(s) within the areas targeted by this assay, and inadequate number of viral copies(<138 copies/mL). A negative result must be combined with clinical observations, patient history, and epidemiological information. The expected result is Negative.  Fact Sheet for Patients:  EntrepreneurPulse.com.au  Fact Sheet for Healthcare Providers:  IncredibleEmployment.be  This test is no t yet approved or cleared by the [place] FDA and  has been authorized for detection and/or diagnosis of SARS-CoV-2 by FDA under an Emergency Use Authorization (EUA). This EUA will remain  in effect (meaning this test can be used) for the duration of the COVID-19 declaration under Section 564(b)(1) of the Act, 21 U.S.C.section 360bbb-3(b)(1), unless the authorization is terminated  or revoked sooner.       Influenza A by PCR NEGATIVE NEGATIVE Final   Influenza B by PCR NEGATIVE NEGATIVE Final    Comment: (NOTE) The Xpert Xpress SARS-CoV-2/FLU/RSV plus assay is intended as an aid in the diagnosis of influenza from Nasopharyngeal swab specimens and should not be used as a sole basis for treatment. Nasal washings and aspirates are unacceptable for Xpert Xpress SARS-CoV-2/FLU/RSV testing.  Fact Sheet for Patients: EntrepreneurPulse.com.au  Fact Sheet for Healthcare Providers: IncredibleEmployment.be  This test is not yet approved or cleared by the [place] FDA and has been authorized for detection and/or diagnosis of SARS-CoV-2 by FDA under an Emergency Use Authorization (EUA). This EUA will remain in  effect (meaning this test can be used) for the duration of the COVID-19 declaration under Section 564(b)(1) of the Act, 21 U.S.C. section 360bbb-3(b)(1), unless the authorization is terminated or revoked.  Performed at [facility], [place] [address] [place], [place] [address]            Radiological Exams on Admission: Personally reviewed imaging which shows: Chest x-ray was unremarkable DG Chest Port 1 View  Result Date: 08/22/2021 CLINICAL DATA:  Shortness of breath EXAM: PORTABLE CHEST 1 VIEW COMPARISON:  02/26/2021 FINDINGS: The heart size and mediastinal contours are within normal limits. Both lungs are clear. The visualized skeletal structures are unremarkable. IMPRESSION: No  acute abnormality of the lungs in AP portable projection. Electronically Signed   By: Eddie Candle M.D.   On: 08/22/2021 14:54         Assessment/Plan   1.  Hyponatremia -On admission sodium 118 - Likely secondary to cirrhosis - Urine osmolality, urine sodium, serum osmolality ordered - Placed on 3% in the ER received approximately 50 mL.  I discontinue this - Serum sodium every 4 hours - Hold home diuretics - Started on albumin 25 g of 25% Q8H for a total of 3 doses - Fluid restriction, low-sodium diet -Strict I's and O's, daily weights  2.  Decompensated alcoholic cirrhosis -Chronic ongoing issue - Continue home rifaximin and lactulose - Hold home diuretics given hyponatremia - SBP prophylaxis with Rocephin  3.  Hyperkalemia -On admission potassium 5.9 - Started on Lokelma 10g TID for 1 day -Hold home spironolactone -Magnesium and phosphorus level ordered - Low potassium diet - Follow-up labs ordered  4.  Mild reactive airway disease -Continue home breathing regiment  5.  Generalized weakness -Suspect secondary to hyponatremia - Fall precautions - PT consulted - Continue treatment for hyponatremia as above    DVT prophylaxis: SCDs Code Status: Full Family  Communication: Mother bedside Disposition Plan: Anticipate discharge home when medically optimized Consults called: None Admission status: Progressive care unit    Medical decision making: Patient seen at 5:17 PM on 08/22/2021.  The patient was discussed with ER provider.  What exists of the patient's chart was reviewed in depth and summarized above.  Clinical condition: Watcher.        Doran Heater [facility] Please page though [place] or Epic secure chat:  For password, contact charge nurse

## 2021-08-22 NOTE — ED Notes (Signed)
Sodium 118  Critical Lab Value reported to [facility] [employer]

## 2021-08-23 LAB — PHOSPHORUS: Phosphorus: 3.2 mg/dL (ref 2.5–4.6)

## 2021-08-23 LAB — COMPREHENSIVE METABOLIC PANEL
ALT: 49 U/L — ABNORMAL HIGH (ref 0–44)
AST: 173 U/L — ABNORMAL HIGH (ref 15–41)
Albumin: 2.7 g/dL — ABNORMAL LOW (ref 3.5–5.0)
Alkaline Phosphatase: 278 U/L — ABNORMAL HIGH (ref 38–126)
Anion gap: 8 (ref 5–15)
BUN: 16 mg/dL (ref 6–20)
CO2: 21 mmol/L — ABNORMAL LOW (ref 22–32)
Calcium: 8.1 mg/dL — ABNORMAL LOW (ref 8.9–10.3)
Chloride: 86 mmol/L — ABNORMAL LOW (ref 98–111)
Creatinine, Ser: 0.63 mg/dL (ref 0.61–1.24)
GFR, Estimated: >60 mL/min (ref >60)
Glucose, Bld: 113 mg/dL — ABNORMAL HIGH (ref 70–99)
Potassium: 4.8 mmol/L (ref 3.5–5.1)
Sodium: 115 mmol/L — CL (ref 135–145)
Total Bilirubin: 10.4 mg/dL — ABNORMAL HIGH (ref 0.3–1.2)
Total Protein: 5.1 g/dL — ABNORMAL LOW (ref 6.5–8.1)

## 2021-08-23 LAB — CBC
HCT: 28.6 % — ABNORMAL LOW (ref 39.0–52.0)
Hemoglobin: 10.4 g/dL — ABNORMAL LOW (ref 13.0–17.0)
MCH: 33.8 pg (ref 26.0–34.0)
MCHC: 36.4 g/dL — ABNORMAL HIGH (ref 30.0–36.0)
MCV: 92.9 fL (ref 80.0–100.0)
Platelets: 51 10*3/uL — ABNORMAL LOW (ref 150–400)
RBC: 3.08 MIL/uL — ABNORMAL LOW (ref 4.22–5.81)
RDW: 15.9 % — ABNORMAL HIGH (ref 11.5–15.5)
WBC: 4.7 10*3/uL (ref 4.0–10.5)
nRBC: 0 % (ref 0.0–0.2)

## 2021-08-23 LAB — SODIUM
Sodium: 117 mmol/L — CL (ref 135–145)
Sodium: 115 mmol/L — CL (ref 135–145)
Sodium: 117 mmol/L — CL (ref 135–145)
Sodium: 121 mmol/L — ABNORMAL LOW (ref 135–145)
Sodium: 121 mmol/L — ABNORMAL LOW (ref 135–145)

## 2021-08-23 LAB — PROTIME-INR
INR: 1.8 — ABNORMAL HIGH (ref 0.8–1.2)
Prothrombin Time: 21.3 seconds — ABNORMAL HIGH (ref 11.4–15.2)

## 2021-08-23 LAB — MRSA NEXT GEN BY PCR, NASAL: MRSA by PCR Next Gen: NOT DETECTED

## 2021-08-23 LAB — MAGNESIUM: Magnesium: 1.5 mg/dL — ABNORMAL LOW (ref 1.7–2.4)

## 2021-08-23 MED ORDER — MAGNESIUM SULFATE IN D5W 1-5 GM/100ML-% IV SOLN
1.0000 g | Freq: Once | INTRAVENOUS | Status: AC
  Administered 2021-08-23: 1 g via INTRAVENOUS
  Filled 2021-08-23: qty 100

## 2021-08-23 NOTE — Progress Notes (Signed)
Date and time results received: 08/23/21 1145   Test: Sodium Critical Value: 115  Name of Provider Notified: Melanee Spry MD   MD contacting nephrology. No new orders at this time.

## 2021-08-23 NOTE — Progress Notes (Signed)
Date and time results received: 08/23/21 1520  Test: Sodium  Critical Value: 117  Name of Provider Notified: Melanee Spry MD   Orders Received? Or Actions Taken?:  No new orders at this time.

## 2021-08-23 NOTE — Progress Notes (Signed)
PROGRESS NOTE    DUSTINE STICKLER  IWP:[id number] DOB: 04-14-1966 DOA: 08/22/2021 PCP: Maximiano Coss, NP   Brief Narrative:  ULUS HAZEN is a 55 y.o. male, with PMH of alcoholic cirrhosis, anxiety, chronic hyponatremia, mild persistent reactive airway disease, who presented to the ER on 08/22/2021 with generalized weakness.  In the ED patient found to have profound hyponatremia, down from baseline dropping as low as 114 at admission.   Assessment & Plan:   Hyponatremia - Slowly improving on 3% NS - Likely secondary to cirrhosis/diuretics (currently on hold)  Decompensated alcoholic cirrhosis - Chronic, ongoing, follows outpatient with GI here as well as transplant facility in [place] - Continue home rifaximin and lactulose - Hold home diuretics given hyponatremia - Discontinue ceftriaxone, last paracentesis 08/11/2021 remains without fever or leukocytosis or abdominal pain   Hyperkalemia - Resolved with Lokelma, follow repeat labs - Likely secondary to home spironolactone   Mild reactive airway disease - Continue home meds/regiment   Generalized weakness -Suspect secondary to hyponatremia - Fall precautions - PT following   DVT prophylaxis: SCDs Code Status: Full Family Communication: None present  Status is: Inpatient  Dispo: The patient is from: Home              Anticipated d/c is to: Home              Anticipated d/c date is: 48 to 72 hours              Patient currently not medically stable for discharge  Consultants:  None  Procedures:  None  Antimicrobials:  Ceftriaxone 9/24-9/25  Subjective: No acute issues or events overnight denies nausea vomiting diarrhea constipation headache fevers chills or chest pain, indicates his weakness is improving but not yet resolved.  Objective: Vitals:   08/23/21 0333 08/23/21 0400 08/23/21 0500 08/23/21 0628  BP:    123/67  Pulse:  66  68  Resp:  (!) 9  10  Temp: 97.7 F (36.5 C)     TempSrc: Oral     SpO2:   99%  100%  Weight:   101 kg   Height:        Intake/Output Summary (Last 24 hours) at 08/23/2021 0737 Last data filed at 08/23/2021 5053 Gross per 24 hour  Intake 506.14 ml  Output --  Net 506.14 ml   Filed Weights   08/22/21 1244 08/22/21 1726 08/23/21 0500  Weight: 102.1 kg 98.4 kg 101 kg    Examination:  General:  Pleasantly resting in bed, No acute distress. HEENT:  Normocephalic atraumatic.  Sclerae nonicteric, noninjected.  Extraocular movements intact bilaterally. Neck:  Without mass or deformity.  Trachea is midline. Lungs:  Clear to auscultate bilaterally without rhonchi, wheeze, or rales. Heart:  Regular rate and rhythm.  Without murmurs, rubs, or gallops. Abdomen:  Soft, nontender moderately distended with notable fluid wave.  Without guarding or rebound. Extremities: Without cyanosis, clubbing, edema, or obvious deformity. Vascular:  Dorsalis pedis and posterior tibial pulses palpable bilaterally. Skin:  Warm and dry, no erythema, no ulcerations.  Data Reviewed: I have personally reviewed following labs and imaging studies  CBC: Recent Labs  Lab 08/22/21 1246 08/23/21 0149  WBC 7.1 4.7  HGB 11.5* 10.4*  HCT 31.4* 28.6*  MCV 92.4 92.9  PLT 62* 51*   Basic Metabolic Panel: Recent Labs  Lab 08/22/21 1246 08/22/21 1500 08/22/21 1817 08/22/21 2014 08/22/21 2141 08/23/21 0149 08/23/21 0530  NA 118* 118* 114* 114* 115* 115* 117*  K 5.9* 5.8*  --  4.6  --  4.8  --   CL 91* 94*  --  86*  --  86*  --   CO2 22 20*  --  21*  --  21*  --   GLUCOSE 118* 102*  --  113*  --  113*  --   BUN 17 16  --  16  --  16  --   CREATININE 0.75 0.55*  --  0.73  --  0.63  --   CALCIUM 8.7* 8.0*  --  8.2*  --  8.1*  --   MG  --   --   --   --   --  1.5*  --   PHOS  --   --   --   --   --  3.2  --    GFR: Estimated Creatinine Clearance: 119.6 mL/min (by C-G formula based on SCr of 0.63 mg/dL). Liver Function Tests: Recent Labs  Lab 08/22/21 1246 08/22/21 2014  08/23/21 0149  AST 207* 176* 173*  ALT 60* 51* 49*  ALKPHOS 331* 286* 278*  BILITOT 12.4* 10.5* 10.4*  PROT 5.6* 5.1* 5.1*  ALBUMIN 2.7* 2.6* 2.7*   Recent Labs  Lab 08/22/21 1246  LIPASE 48   Recent Labs  Lab 08/22/21 1416  AMMONIA 44*   Coagulation Profile: Recent Labs  Lab 08/22/21 1817 08/23/21 0149  INR 1.8* 1.8*   Cardiac Enzymes: No results for input(s): CKTOTAL, CKMB, CKMBINDEX, TROPONINI in the last 168 hours. BNP (last 3 results) No results for input(s): PROBNP in the last 8760 hours. HbA1C: No results for input(s): HGBA1C in the last 72 hours. CBG: No results for input(s): GLUCAP in the last 168 hours. Lipid Profile: No results for input(s): CHOL, HDL, LDLCALC, TRIG, CHOLHDL, LDLDIRECT in the last 72 hours. Thyroid Function Tests: No results for input(s): TSH, T4TOTAL, FREET4, T3FREE, THYROIDAB in the last 72 hours. Anemia Panel: No results for input(s): VITAMINB12, FOLATE, FERRITIN, TIBC, IRON, RETICCTPCT in the last 72 hours. Sepsis Labs: No results for input(s): PROCALCITON, LATICACIDVEN in the last 168 hours.  Recent Results (from the past 240 hour(s))  Resp Panel by RT-PCR (Flu A&B, Covid) Nasopharyngeal Swab     Status: None   Collection Time: 08/22/21  3:42 PM   Specimen: Nasopharyngeal Swab; Nasopharyngeal(NP) swabs in vial transport medium  Result Value Ref Range Status   SARS Coronavirus 2 by RT PCR NEGATIVE NEGATIVE Final    Comment: (NOTE) SARS-CoV-2 target nucleic acids are NOT DETECTED.  The SARS-CoV-2 RNA is generally detectable in upper respiratory specimens during the acute phase of infection. The lowest concentration of SARS-CoV-2 viral copies this assay can detect is 138 copies/mL. A negative result does not preclude SARS-Cov-2 infection and should not be used as the sole basis for treatment or other patient management decisions. A negative result may occur with  improper specimen collection/handling, submission of specimen  other than nasopharyngeal swab, presence of viral mutation(s) within the areas targeted by this assay, and inadequate number of viral copies(<138 copies/mL). A negative result must be combined with clinical observations, patient history, and epidemiological information. The expected result is Negative.  Fact Sheet for Patients:  EntrepreneurPulse.com.au  Fact Sheet for Healthcare Providers:  IncredibleEmployment.be  This test is no t yet approved or cleared by the [place] FDA and  has been authorized for detection and/or diagnosis of SARS-CoV-2 by FDA under an Emergency Use Authorization (EUA). This EUA will remain  in effect (  meaning this test can be used) for the duration of the COVID-19 declaration under Section 564(b)(1) of the Act, 21 U.S.C.section 360bbb-3(b)(1), unless the authorization is terminated  or revoked sooner.       Influenza A by PCR NEGATIVE NEGATIVE Final   Influenza B by PCR NEGATIVE NEGATIVE Final    Comment: (NOTE) The Xpert Xpress SARS-CoV-2/FLU/RSV plus assay is intended as an aid in the diagnosis of influenza from Nasopharyngeal swab specimens and should not be used as a sole basis for treatment. Nasal washings and aspirates are unacceptable for Xpert Xpress SARS-CoV-2/FLU/RSV testing.  Fact Sheet for Patients: EntrepreneurPulse.com.au  Fact Sheet for Healthcare Providers: IncredibleEmployment.be  This test is not yet approved or cleared by the [place] FDA and has been authorized for detection and/or diagnosis of SARS-CoV-2 by FDA under an Emergency Use Authorization (EUA). This EUA will remain in effect (meaning this test can be used) for the duration of the COVID-19 declaration under Section 564(b)(1) of the Act, 21 U.S.C. section 360bbb-3(b)(1), unless the authorization is terminated or revoked.  Performed at [facility], [place] [address] [place], [place] [address]   MRSA Next Gen by PCR, Nasal     Status: None   Collection Time: 08/22/21 10:50 PM   Specimen: Nasal Mucosa; Nasal Swab  Result Value Ref Range Status   MRSA by PCR Next Gen NOT DETECTED NOT DETECTED Final    Comment: (NOTE) The GeneXpert MRSA Assay (FDA approved for NASAL specimens only), is one component of a comprehensive MRSA colonization surveillance program. It is not intended to diagnose MRSA infection nor to guide or monitor treatment for MRSA infections. Test performance is not FDA approved in patients less than 93 years old. Performed at [facility], [place] [address] [place], [place] [address]          Radiology Studies: DG Chest Port 1 View  Result Date: 08/22/2021 CLINICAL DATA:  Shortness of breath EXAM: PORTABLE CHEST 1 VIEW COMPARISON:  02/26/2021 FINDINGS: The heart size and mediastinal contours are within normal limits. Both lungs are clear. The visualized skeletal structures are unremarkable. IMPRESSION: No acute abnormality of the lungs in AP portable projection. Electronically Signed   By: Eddie Candle M.D.   On: 08/22/2021 14:54     Scheduled Meds:  Chlorhexidine Gluconate Cloth  6 each Topical Q0600   dicyclomine  10 mg Oral TID AC & HS   fluticasone furoate-vilanterol  1 puff Inhalation Daily   folic acid  1 mg Oral Daily   pantoprazole  40 mg Oral BID   rifaximin  1,100 mg Oral BID   sodium zirconium cyclosilicate  10 g Oral TID   Continuous Infusions:  albumin human 60 mL/hr at 08/23/21 0305   cefTRIAXone (ROCEPHIN)  IV Stopped (08/22/21 1918)   sodium chloride (hypertonic) 40 mL/hr at 08/23/21 0305     LOS: 1 day   Time spent: 53mn  Jewelia Bocchino C Shaune Westfall, DO [facility]  If 7PM-7AM, please contact night-coverage [email or web address]  08/23/2021, 7:37 AM

## 2021-08-23 NOTE — Progress Notes (Signed)
Pharmacy Brief Note - Concentrated Sodium Chloride Monitoring:  Patient on hypertonic saline at 40 mL/hr and q4h Na checks.   Most recent Na = 121; an increased of 6 mEq since ~930 this morning. Paged nephrologist to inquire about any necessary changes to rate of infusion.   Verbal order received to decrease rate of hypertonic saline to 35 mL/hr and continue q4h Na checks.   Lenis Noon, PharmD 08/23/21 7:00 PM

## 2021-08-23 NOTE — Consult Note (Signed)
Renal Service Consult Note Ian Hall - [facility]  LATRELL POTEMPA 08/23/2021 Sol Blazing, MD Requesting Physician: Dr Avon Gully  Reason for Consult: Hyponatremia HPI: The patient is a 55 y.o. year-old w/ hx of etoh cirrhosis, mild RAD, anxiety presented to ED on 9/24 for generalized weakness. Hx of chronic hyponatremia. Is f/b liver transplant team in [place]. Has LVP on 9/13 x 4.7 L. Was recently seen by palliative care. In ED BP 130/80, HR 76  afeb, Na 118 K 5.9 CO2 22 creat 0.75 alb 2.7  NH3 44  total bili 12.4  Hb 11.5 plt 62k. Pt was admitted and given lokelma and IV 3% saline was started at 50 cc/hr. He rec'd IV albumin as well 25 gm tid, IV rocephin, IV Mg. Looks like 3% saline was only given for an hour or so or not at all yesterday afternoon. Repeat Na was lower at 114 and 3% Na was resumed in the evening at 40 cc/hr. BP's have been normal here, no hypotension. Na+ levels today are 151- 117.  Asked to see for hyponatremia.   Pt seen in room. Had dizziness brought him to ED.  Nothing further here, but hasn't been OOB much here. Has 2-3 more appts before getting on the transplant list in [place]. Denies any SOB or leg swelling.    ROS - denies CP, no joint pain, no HA, no blurry vision, no rash, no diarrhea, no nausea/ vomiting, no dysuria, no difficulty voiding   Past Medical History  Past Medical History:  Diagnosis Date   Anxiety    Cirrhosis ([place])    COVID-19    Past Surgical History  Past Surgical History:  Procedure Laterality Date   ANKLE SURGERY Right    IR PARACENTESIS  01/07/2021   IR PARACENTESIS  01/30/2021   IR PARACENTESIS  05/12/2021   IR PARACENTESIS  05/27/2021   IR PARACENTESIS  08/11/2021   IR TRANSCATHETER BX  01/09/2021   IR US GUIDE VASC ACCESS RIGHT  01/09/2021   IR VENOGRAM HEPATIC W HEMODYNAMIC EVALUATION  01/09/2021   Family History  Family History  Problem Relation Age of Onset   Diabetes Mother    Memory loss Mother    Diabetes Father     Diabetes Cousin    Colon cancer Neg Hx    Social History  reports that he quit smoking about 18 years ago. His smoking use included cigarettes. He started smoking about 41 years ago. He has a 11.50 pack-year smoking history. He has never used smokeless tobacco. He reports that he does not currently use alcohol. He reports that he does not use drugs. Allergies  Allergies  Allergen Reactions   Hydrocodone Itching and Other (See Comments)    Noted with hydrocodone cough syrup. Itching only, no hives.  02/03/16: denied itching with recent hydrocodone cough syrup. Only one episode of itching previously.    Home medications Prior to Admission medications   Medication Sig Start Date End Date Taking? Authorizing Provider  albuterol (VENTOLIN HFA) 108 (90 Base) MCG/ACT inhaler INHALE 2 PUFFS INTO THE LUNGS EVERY 6 HOURS AS NEEDED FOR WHEEZING OR SHORTNESS OF BREATH 04/28/21  Yes Maximiano Coss, NP  bethanechol (URECHOLINE) 10 MG tablet Take 10 mg by mouth 3 (three) times daily. 08/20/21  Yes [provider]  dicyclomine (BENTYL) 10 MG capsule TAKE 1 CAPSULE BY MOUTH 4 TIMES A DAY BEFORE MEALS AND AT BEDTIME 08/11/21  Yes Esterwood, Amy S, PA-C  fluticasone furoate-vilanterol (BREO ELLIPTA) 100-25 MCG/INH  AEPB Inhale 1 puff into the lungs daily. 06/03/21  Yes Freddi Starr, MD  folic acid (FOLVITE) 1 MG tablet TAKE 1 TABLET BY MOUTH EVERY DAY 08/12/21  Yes Esterwood, Amy S, PA-C  furosemide (LASIX) 20 MG tablet Take 1 tablet (20 mg total) by mouth daily. 05/21/21 06/25/22 Yes Thornton Park, MD  hydrOXYzine (ATARAX/VISTARIL) 10 MG tablet TAKE 1/2 TO 1 TABLET(5 TO 10 MG) BY MOUTH AT BEDTIME AS NEEDED FOR ITCHING 05/15/21  Yes Maximiano Coss, NP  lactulose (CHRONULAC) 10 GM/15ML solution Take 10 g by mouth 3 (three) times daily as needed for mild constipation.   Yes [provider]  meclizine (ANTIVERT) 12.5 MG tablet TAKE 1 TABLET(12.5 MG) BY MOUTH DAILY AS NEEDED FOR DIZZINESS 07/22/21   Yes Esterwood, Amy S, PA-C  pantoprazole (PROTONIX) 40 MG tablet Take 1 tablet (40 mg total) by mouth 2 (two) times daily. 05/11/21 06/25/22 Yes Esterwood, Amy S, PA-C  sertraline (ZOLOFT) 50 MG tablet Take 50 mg by mouth daily. 08/20/21  Yes [provider]  spironolactone (ALDACTONE) 100 MG tablet TAKE 1 TABLET BY MOUTH EVERY DAY 08/20/21  Yes Thornton Park, MD  XIFAXAN 550 MG TABS tablet TAKE 2 TABLETS BY MOUTH TWICE A DAY 08/12/21  Yes Esterwood, Amy S, PA-C     Vitals:   08/23/21 1000 08/23/21 1100 08/23/21 1200 08/23/21 1252  BP: (!) 143/77 (!) 150/84 (!) 144/78   Pulse: 74 64 65   Resp: 12 (!) 9 10   Temp:    (!) 97.5 F (36.4 C)  TempSrc:    Oral  SpO2: 100% 100% 100%   Weight:      Height:       Exam Gen alert, no distress No rash, cyanosis or gangrene Sclera anicteric, throat clear  No jvd or bruits Chest clear bilat to bases, no rales/ wheezing RRR no MRG Abd soft ntnd no mass, 2-3+ ascites  GU normal male MS no joint effusions or deformity Ext no LE or UE edema, no wounds or ulcers Neuro is alert, Ox 3 , nf, no asterixis         Home meds include - bentyl, venotolin, breoellipta, lasix 20 qd, lactulose tid, prontix, zoloft, aldactone, xifaxan   UA 9/24 - negative   UN 55, UOsm 769    CXR port - IMPRESSION: No acute abnormality of the lungs in AP portable projection.     BP's 125- 145/ 75-90  HR 70's  RR 10-16  afeb    Na 115 K 4.8 CO2 21  BUN 16  cr 0.63   alb 2.7  AST 173  AlT 49  Tbili 10.4    eGFR > 60   WBC 4K Hb 10.4  plt 51k   INR 1.8  glu 113   Assessment/ Plan: Hyponatremia - in cirrhosis patient w/ 2+ ascites, but normotensive and normal UNa.  Pt is euvolemic.  Not taking any BP lowering meds and K+ is not low. He was on SSRI which is being held. Will get TSH and am cortisol.  IV albumin is one option for treating low Na+ in cirrhotics, and he is getting albumin here. With Na+ < 120 and +symptoms on admission , 3% saline is needed. 3%  saline was started last night at 40 cc/hr. Na not much better today, will tighten fluid restriction to 1000 cc/ day, cont q 4h sodium levels. Avoid ^'ing Na levels more than 6 mEq per day. Goal for tonight is Na 120-121.  Cirrhosis - tbili 10, INR 1.8, +ascites, no AMS      Rob Laird Runnion  MD 08/23/2021, 1:57 PM  Recent Labs  Lab 08/22/21 1246 08/23/21 0149  WBC 7.1 4.7  HGB 11.5* 10.4*   Recent Labs  Lab 08/22/21 2014 08/23/21 0149  K 4.6 4.8  BUN 16 16  CREATININE 0.73 0.63  CALCIUM 8.2* 8.1*  PHOS  --  3.2

## 2021-08-24 ENCOUNTER — Ambulatory Visit: Payer: No Typology Code available for payment source | Admitting: Registered Nurse

## 2021-08-24 ENCOUNTER — Inpatient Hospital Stay (HOSPITAL_COMMUNITY): Payer: No Typology Code available for payment source

## 2021-08-24 LAB — CBC
HCT: 26.6 % — ABNORMAL LOW (ref 39.0–52.0)
Hemoglobin: 9.6 g/dL — ABNORMAL LOW (ref 13.0–17.0)
MCH: 33.4 pg (ref 26.0–34.0)
MCHC: 36.1 g/dL — ABNORMAL HIGH (ref 30.0–36.0)
MCV: 92.7 fL (ref 80.0–100.0)
Platelets: 53 10*3/uL — ABNORMAL LOW (ref 150–400)
RBC: 2.87 MIL/uL — ABNORMAL LOW (ref 4.22–5.81)
RDW: 15.9 % — ABNORMAL HIGH (ref 11.5–15.5)
WBC: 4.1 10*3/uL (ref 4.0–10.5)
nRBC: 0 % (ref 0.0–0.2)

## 2021-08-24 LAB — IRON AND TIBC
Iron: 142 ug/dL (ref 45–182)
Saturation Ratios: 88 % — ABNORMAL HIGH (ref 17.9–39.5)
TIBC: 161 ug/dL — ABNORMAL LOW (ref 250–450)
UIBC: 19 ug/dL

## 2021-08-24 LAB — BASIC METABOLIC PANEL
Anion gap: 5 (ref 5–15)
BUN: 14 mg/dL (ref 6–20)
CO2: 21 mmol/L — ABNORMAL LOW (ref 22–32)
Calcium: 8.2 mg/dL — ABNORMAL LOW (ref 8.9–10.3)
Chloride: 98 mmol/L (ref 98–111)
Creatinine, Ser: 0.74 mg/dL (ref 0.61–1.24)
GFR, Estimated: >60 mL/min (ref >60)
Glucose, Bld: 94 mg/dL (ref 70–99)
Potassium: 4.9 mmol/L (ref 3.5–5.1)
Sodium: 124 mmol/L — ABNORMAL LOW (ref 135–145)

## 2021-08-24 LAB — FERRITIN: Ferritin: 558 ng/mL — ABNORMAL HIGH (ref 24–336)

## 2021-08-24 LAB — SODIUM
Sodium: 120 mmol/L — ABNORMAL LOW (ref 135–145)
Sodium: 123 mmol/L — ABNORMAL LOW (ref 135–145)
Sodium: 125 mmol/L — ABNORMAL LOW (ref 135–145)
Sodium: 124 mmol/L — ABNORMAL LOW (ref 135–145)
Sodium: 125 mmol/L — ABNORMAL LOW (ref 135–145)

## 2021-08-24 MED ORDER — FUROSEMIDE 20 MG PO TABS
20.0000 mg | ORAL_TABLET | Freq: Every day | ORAL | Status: DC
  Administered 2021-08-24 – 2021-08-25 (×2): 20 mg via ORAL
  Filled 2021-08-24: qty 1

## 2021-08-24 MED ORDER — MAGNESIUM OXIDE -MG SUPPLEMENT 400 (240 MG) MG PO TABS
400.0000 mg | ORAL_TABLET | Freq: Two times a day (BID) | ORAL | Status: DC
  Administered 2021-08-24 – 2021-08-25 (×3): 400 mg via ORAL
  Filled 2021-08-24 (×3): qty 1

## 2021-08-24 MED ORDER — LIDOCAINE HCL 1 % IJ SOLN
INTRAMUSCULAR | Status: AC
  Filled 2021-08-24: qty 20

## 2021-08-24 NOTE — Evaluation (Signed)
Physical Therapy Evaluation Patient Details Name: Ian Hall MRN: [id number] DOB: 1966-01-15 Today's Date: 08/24/2021  History of Present Illness  55 yo male admitted with hyponatremia. Hx of NASH, ETOH cirrhosis-awaiting liver transplant-f/u in [place], chronic hyponatremia, DVT, Sz  Clinical Impression  On eval, pt was Supv-Min guard assist for mobility. He walked ~250 feet around the unit in ICU. He is mildly unsteady but did not have a LOB with cane use. Dyspnea 2/4 with ambulation. Will plan to follow and progress activity as tolerated. Pt is agreeable to HHPT f/u.        Recommendations for follow up therapy are one component of a multi-disciplinary discharge planning process, led by the attending physician.  Recommendations may be updated based on patient status, additional functional criteria and insurance authorization.  Follow Up Recommendations Home health PT    Equipment Recommendations  None recommended by PT    Recommendations for Other Services       Precautions / Restrictions Precautions Precautions: Fall Precaution Comments: Sz prec Restrictions Weight Bearing Restrictions: No      Mobility  Bed Mobility               General bed mobility comments: Supv for line management only    Transfers                 General transfer comment: Supv for line management only  Ambulation/Gait Ambulation/Gait assistance: Min guard Gait Distance (Feet): 250 Feet Assistive device: Straight cane Gait Pattern/deviations: Step-through pattern;Decreased stride length     General Gait Details: Mildly unsteady but no LOB. Dyspnea 2/4. Pt tolerated distance well.  Stairs            Wheelchair Mobility    Modified Rankin (Stroke Patients Only)       Balance Overall balance assessment: Mild deficits observed, not formally tested                                           Pertinent Vitals/Pain Pain Assessment: No/denies pain     Home Living Family/patient expects to be discharged to:: Private residence Living Arrangements: Spouse/significant other Available Help at Discharge: Family;Available 24 hours/day Type of Home: House Home Access: Stairs to enter Entrance Stairs-Rails: Right Entrance Stairs-Number of Steps: 2 Home Layout: One level Home Equipment: Grab bars - tub/shower;Shower seat;Cane - single point      Prior Function Level of Independence: Independent with assistive device(s)         Comments: uses cane most recently since fall last week PTA     Hand Dominance        Extremity/Trunk Assessment   Upper Extremity Assessment Upper Extremity Assessment: Overall WFL for tasks assessed    Lower Extremity Assessment Lower Extremity Assessment: Generalized weakness    Cervical / Trunk Assessment Cervical / Trunk Assessment: Normal  Communication   Communication: No difficulties  Cognition Arousal/Alertness: Awake/alert Behavior During Therapy: WFL for tasks assessed/performed Overall Cognitive Status: Within Functional Limits for tasks assessed                                        General Comments      Exercises     Assessment/Plan    PT Assessment Patient needs continued PT services  PT Problem List Decreased  mobility;Decreased activity tolerance;Decreased balance;Decreased strength       PT Treatment Interventions DME instruction;Gait training;Therapeutic exercise;Balance training;Functional mobility training;Therapeutic activities;Patient/family education    PT Goals (Current goals can be found in the Care Plan section)  Acute Rehab PT Goals Patient Stated Goal: to be able to get liver transplant PT Goal Formulation: With patient Time For Goal Achievement: 09/07/21 Potential to Achieve Goals: Good    Frequency Min 3X/week   Barriers to discharge        Co-evaluation               AM-PAC PT "6 Clicks" Mobility  Outcome Measure Help  needed turning from your back to your side while in a flat bed without using bedrails?: None Help needed moving from lying on your back to sitting on the side of a flat bed without using bedrails?: None Help needed moving to and from a bed to a chair (including a wheelchair)?: A Little Help needed standing up from a chair using your arms (e.g., wheelchair or bedside chair)?: A Little Help needed to walk in hospital room?: A Little Help needed climbing 3-5 steps with a railing? : A Little 6 Click Score: 20    End of Session Equipment Utilized During Treatment: Gait belt Activity Tolerance: Patient tolerated treatment well Patient left: in bed;with call bell/phone within reach   PT Visit Diagnosis: Difficulty in walking, not elsewhere classified (R26.2)    Time: 5638-7564 PT Time Calculation (min) (ACUTE ONLY): 12 min   Charges:   PT Evaluation $PT Eval Moderate Complexity: 1 Mod            Doreatha Massed, PT Acute Rehabilitation  Office: [phone] Pager: [phone]

## 2021-08-24 NOTE — Procedures (Signed)
PROCEDURE SUMMARY:  Successful US guided therapeutic paracentesis from RLQ.  Yielded 3.6 L of clear, amber fluid.  No immediate complications.  Pt tolerated well.   Specimen not sent for labs.  EBL < 27m  STyson Alias NP 08/24/2021 4:07 PM

## 2021-08-24 NOTE — Progress Notes (Signed)
PROGRESS NOTE    Ian Hall  LFY:[id number] DOB: [birth year] DOA: 08/22/2021 PCP: Maximiano Coss, NP   Brief Narrative:  Ian Hall is a 55 y.o. male, with PMH of alcoholic cirrhosis, anxiety, chronic hyponatremia, mild persistent reactive airway disease, who presented to the ER on 08/22/2021 with generalized weakness.  In the ED patient found to have profound hyponatremia, down from baseline dropping as low as 114 at admission.   Assessment & Plan:   Hyponatremia - Slowly improving on 3% NS - admitted at 118; baseline around 130 - 124 today - Likely secondary to cirrhosis/diuretics (currently on hold)  Decompensated alcoholic cirrhosis - Chronic, ongoing, follows outpatient with GI here as well as transplant facility in [place] - Continue home rifaximin and lactulose - Hold home diuretics given hyponatremia - Paracentesis ordered today given worsening abdominal distention, IR to follow: We appreciate their assistance - Discontinue ceftriaxone, last paracentesis 08/11/2021 remains without fever or leukocytosis or abdominal pain   Hyperkalemia - Resolved with Lokelma, follow repeat labs - Likely secondary to home spironolactone   Mild reactive airway disease - Continue home meds/regiment   Generalized weakness -Suspect secondary to hyponatremia - Fall precautions - PT following   DVT prophylaxis: SCDs Code Status: Full Family Communication: None present  Status is: Inpatient  Dispo: The patient is from: Home              Anticipated d/c is to: Home              Anticipated d/c date is: 48 to 72 hours              Patient currently not medically stable for discharge  Consultants:  None  Procedures:  None  Antimicrobials:  Ceftriaxone 9/24-9/25  Subjective: No acute issues or events overnight, patient's abdominal distention appears to be minimally worsening over the last 24 hours, discussed paracentesis which patient is agreeable.  Otherwise patient denies  chest pain shortness of breath nausea vomiting diarrhea constipation headache fevers or chills.  Objective: Vitals:   08/24/21 0500 08/24/21 0600 08/24/21 0645 08/24/21 0700  BP: 124/66 117/61  97/60  Pulse: 77 76  80  Resp: 11 10  15   Temp:   98.2 F (36.8 C)   TempSrc:   Oral   SpO2: 97% 97%  97%  Weight: 102.2 kg     Height:        Intake/Output Summary (Last 24 hours) at 08/24/2021 0718 Last data filed at 08/24/2021 0409 Gross per 24 hour  Intake 1418.81 ml  Output 400 ml  Net 1018.81 ml    Filed Weights   08/22/21 1726 08/23/21 0500 08/24/21 0500  Weight: 98.4 kg 101 kg 102.2 kg    Examination:  General:  Pleasantly resting in bed, No acute distress. HEENT:  Normocephalic atraumatic.  Sclerae nonicteric, noninjected.  Extraocular movements intact bilaterally. Neck:  Without mass or deformity.  Trachea is midline. Lungs:  Clear to auscultate bilaterally without rhonchi, wheeze, or rales. Heart:  Regular rate and rhythm.  Without murmurs, rubs, or gallops. Abdomen:  Soft, nontender, moderately distended without tympany, guarding or rebound tenderness Extremities: Without cyanosis, clubbing, edema, or obvious deformity. Vascular:  Dorsalis pedis and posterior tibial pulses palpable bilaterally. Skin:  Warm and dry, no erythema, no ulcerations.  Data Reviewed: I have personally reviewed following labs and imaging studies  CBC: Recent Labs  Lab 08/22/21 1246 08/23/21 0149 08/24/21 0244  WBC 7.1 4.7 4.1  HGB 11.5* 10.4* 9.6*  HCT  31.4* 28.6* 26.6*  MCV 92.4 92.9 92.7  PLT 62* 51* 53*    Basic Metabolic Panel: Recent Labs  Lab 08/22/21 1246 08/22/21 1500 08/22/21 1817 08/22/21 2014 08/22/21 2141 08/23/21 0149 08/23/21 0530 08/23/21 1344 08/23/21 1816 08/23/21 2216 08/24/21 0244 08/24/21 0640  NA 118* 118*   < > 114*   < > 115*   < > 117* 121* 121* 124* 120*  K 5.9* 5.8*  --  4.6  --  4.8  --   --   --   --  4.9  --   CL 91* 94*  --  86*  --  86*   --   --   --   --  98  --   CO2 22 20*  --  21*  --  21*  --   --   --   --  21*  --   GLUCOSE 118* 102*  --  113*  --  113*  --   --   --   --  94  --   BUN 17 16  --  16  --  16  --   --   --   --  14  --   CREATININE 0.75 0.55*  --  0.73  --  0.63  --   --   --   --  0.74  --   CALCIUM 8.7* 8.0*  --  8.2*  --  8.1*  --   --   --   --  8.2*  --   MG  --   --   --   --   --  1.5*  --   --   --   --   --   --   PHOS  --   --   --   --   --  3.2  --   --   --   --   --   --    < > = values in this interval not displayed.    GFR: Estimated Creatinine Clearance: 120.2 mL/min (by C-G formula based on SCr of 0.74 mg/dL). Liver Function Tests: Recent Labs  Lab 08/22/21 1246 08/22/21 2014 08/23/21 0149  AST 207* 176* 173*  ALT 60* 51* 49*  ALKPHOS 331* 286* 278*  BILITOT 12.4* 10.5* 10.4*  PROT 5.6* 5.1* 5.1*  ALBUMIN 2.7* 2.6* 2.7*    Recent Labs  Lab 08/22/21 1246  LIPASE 48    Recent Labs  Lab 08/22/21 1416  AMMONIA 44*    Coagulation Profile: Recent Labs  Lab 08/22/21 1817 08/23/21 0149  INR 1.8* 1.8*    Cardiac Enzymes: No results for input(s): CKTOTAL, CKMB, CKMBINDEX, TROPONINI in the last 168 hours. BNP (last 3 results) No results for input(s): PROBNP in the last 8760 hours. HbA1C: No results for input(s): HGBA1C in the last 72 hours. CBG: No results for input(s): GLUCAP in the last 168 hours. Lipid Profile: No results for input(s): CHOL, HDL, LDLCALC, TRIG, CHOLHDL, LDLDIRECT in the last 72 hours. Thyroid Function Tests: No results for input(s): TSH, T4TOTAL, FREET4, T3FREE, THYROIDAB in the last 72 hours. Anemia Panel: No results for input(s): VITAMINB12, FOLATE, FERRITIN, TIBC, IRON, RETICCTPCT in the last 72 hours. Sepsis Labs: No results for input(s): PROCALCITON, LATICACIDVEN in the last 168 hours.  Recent Results (from the past 240 hour(s))  Resp Panel by RT-PCR (Flu A&B, Covid) Nasopharyngeal Swab     Status: None   Collection Time: 08/22/21  3:42 PM   Specimen: Nasopharyngeal Swab; Nasopharyngeal(NP) swabs in vial transport medium  Result Value Ref Range Status   SARS Coronavirus 2 by RT PCR NEGATIVE NEGATIVE Final    Comment: (NOTE) SARS-CoV-2 target nucleic acids are NOT DETECTED.  The SARS-CoV-2 RNA is generally detectable in upper respiratory specimens during the acute phase of infection. The lowest concentration of SARS-CoV-2 viral copies this assay can detect is 138 copies/mL. A negative result does not preclude SARS-Cov-2 infection and should not be used as the sole basis for treatment or other patient management decisions. A negative result may occur with  improper specimen collection/handling, submission of specimen other than nasopharyngeal swab, presence of viral mutation(s) within the areas targeted by this assay, and inadequate number of viral copies(<138 copies/mL). A negative result must be combined with clinical observations, patient history, and epidemiological information. The expected result is Negative.  Fact Sheet for Patients:  EntrepreneurPulse.com.au  Fact Sheet for Healthcare Providers:  IncredibleEmployment.be  This test is no t yet approved or cleared by the [place] FDA and  has been authorized for detection and/or diagnosis of SARS-CoV-2 by FDA under an Emergency Use Authorization (EUA). This EUA will remain  in effect (meaning this test can be used) for the duration of the COVID-19 declaration under Section 564(b)(1) of the Act, 21 U.S.C.section 360bbb-3(b)(1), unless the authorization is terminated  or revoked sooner.       Influenza A by PCR NEGATIVE NEGATIVE Final   Influenza B by PCR NEGATIVE NEGATIVE Final    Comment: (NOTE) The Xpert Xpress SARS-CoV-2/FLU/RSV plus assay is intended as an aid in the diagnosis of influenza from Nasopharyngeal swab specimens and should not be used as a sole basis for treatment. Nasal washings and aspirates  are unacceptable for Xpert Xpress SARS-CoV-2/FLU/RSV testing.  Fact Sheet for Patients: EntrepreneurPulse.com.au  Fact Sheet for Healthcare Providers: IncredibleEmployment.be  This test is not yet approved or cleared by the [place] FDA and has been authorized for detection and/or diagnosis of SARS-CoV-2 by FDA under an Emergency Use Authorization (EUA). This EUA will remain in effect (meaning this test can be used) for the duration of the COVID-19 declaration under Section 564(b)(1) of the Act, 21 U.S.C. section 360bbb-3(b)(1), unless the authorization is terminated or revoked.  Performed at [facility], [place] [address], [place], [place] [address]   MRSA Next Gen by PCR, Nasal     Status: None   Collection Time: 08/22/21 10:50 PM   Specimen: Nasal Mucosa; Nasal Swab  Result Value Ref Range Status   MRSA by PCR Next Gen NOT DETECTED NOT DETECTED Final    Comment: (NOTE) The GeneXpert MRSA Assay (FDA approved for NASAL specimens only), is one component of a comprehensive MRSA colonization surveillance program. It is not intended to diagnose MRSA infection nor to guide or monitor treatment for MRSA infections. Test performance is not FDA approved in patients less than 71 years old. Performed at [facility], [place] [address], [place], [place] [address]           Radiology Studies: DG Chest Port 1 View  Result Date: 08/22/2021 CLINICAL DATA:  Shortness of breath EXAM: PORTABLE CHEST 1 VIEW COMPARISON:  02/26/2021 FINDINGS: The heart size and mediastinal contours are within normal limits. Both lungs are clear. The visualized skeletal structures are unremarkable. IMPRESSION: No acute abnormality of the lungs in AP portable projection. Electronically Signed   By: Eddie Candle M.D.   On: 08/22/2021 14:54  Scheduled Meds:  Chlorhexidine Gluconate Cloth  6 each Topical Q0600   dicyclomine  10 mg  Oral TID AC & HS   fluticasone furoate-vilanterol  1 puff Inhalation Daily   folic acid  1 mg Oral Daily   pantoprazole  40 mg Oral BID   rifaximin  1,100 mg Oral BID   Continuous Infusions:  sodium chloride (hypertonic) 35 mL/hr at 08/24/21 0409     LOS: 2 days   Time spent: 8mn  Siennah Barrasso C Durrell Barajas, DO [facility]  If 7PM-7AM, please contact night-coverage [email or web address]  08/24/2021, 7:18 AM

## 2021-08-24 NOTE — Progress Notes (Signed)
Patient ID: Ian Hall, male   DOB: 1966-03-07, 55 y.o.   MRN: [id number] [facility] Progress Note   Assessment/ Plan:   1.  Hyponatremia: Appears to be acute on chronic likely associated with his history of cirrhosis.  Urine osmolality inappropriately elevated suggesting inappropriate ADH activation.  Started on 3% saline for neurological symptoms associated with hyponatremia.  Sodium level now >120 and I will discontinue 3% saline and restart oral furosemide to augment ongoing fluid restriction.  TSH level normal and random cortisol not indicative of adrenal insufficiency. 2.  History of cirrhosis: Without evidence of hepatic encephalopathy and ongoing treatment with lactulose. 3.  Anemia of chronic disease: Without overt blood loss, check iron studies and decide on need for supplementation versus ESA.  Subjective:   Reports to be feeling better and denies any chest pain or shortness of breath.  Does not have diarrhea.   Objective:   BP 121/78 (BP Location: Right Arm)   Pulse 72   Temp 98.2 F (36.8 C) (Oral)   Resp (!) 9   Ht 5' 7"  (1.702 m)   Wt 102.2 kg   SpO2 99%   BMI 35.29 kg/m   Intake/Output Summary (Last 24 hours) at 08/24/2021 1223 Last data filed at 08/24/2021 0800 Gross per 24 hour  Intake 1074.51 ml  Output 700 ml  Net 374.51 ml   Weight change: 0.141 kg  Physical Exam: Gen: Comfortable resting in bed, RN at bedside CVS: Pulse regular rhythm, normal rate, S1 and S2 normal Resp: Decreased breath sounds over bases-poor inspiratory effort, no rales/rhonchi Abd: Soft, moderate global distention with ascites Ext: No lower extremity edema  Imaging: DG Chest Port 1 View  Result Date: 08/22/2021 CLINICAL DATA:  Shortness of breath EXAM: PORTABLE CHEST 1 VIEW COMPARISON:  02/26/2021 FINDINGS: The heart size and mediastinal contours are within normal limits. Both lungs are clear. The visualized skeletal structures are unremarkable. IMPRESSION: No acute  abnormality of the lungs in AP portable projection. Electronically Signed   By: Eddie Candle M.D.   On: 08/22/2021 14:54    Labs: BMET Recent Labs  Lab 08/22/21 1246 08/22/21 1500 08/22/21 1817 08/22/21 2014 08/22/21 2141 08/23/21 0149 08/23/21 0530 08/23/21 0948 08/23/21 1344 08/23/21 1816 08/23/21 2216 08/24/21 0244 08/24/21 0640 08/24/21 0955  NA 118* 118*   < > 114*   < > 115*   < > 115* 117* 121* 121* 124* 120* 123*  K 5.9* 5.8*  --  4.6  --  4.8  --   --   --   --   --  4.9  --   --   CL 91* 94*  --  86*  --  86*  --   --   --   --   --  98  --   --   CO2 22 20*  --  21*  --  21*  --   --   --   --   --  21*  --   --   GLUCOSE 118* 102*  --  113*  --  113*  --   --   --   --   --  94  --   --   BUN 17 16  --  16  --  16  --   --   --   --   --  14  --   --   CREATININE 0.75 0.55*  --  0.73  --  0.63  --   --   --   --   --  0.74  --   --   CALCIUM 8.7* 8.0*  --  8.2*  --  8.1*  --   --   --   --   --  8.2*  --   --   PHOS  --   --   --   --   --  3.2  --   --   --   --   --   --   --   --    < > = values in this interval not displayed.   CBC Recent Labs  Lab 08/22/21 1246 08/23/21 0149 08/24/21 0244  WBC 7.1 4.7 4.1  HGB 11.5* 10.4* 9.6*  HCT 31.4* 28.6* 26.6*  MCV 92.4 92.9 92.7  PLT 62* 51* 53*   Medications:     Chlorhexidine Gluconate Cloth  6 each Topical Q0600   dicyclomine  10 mg Oral TID AC & HS   fluticasone furoate-vilanterol  1 puff Inhalation Daily   folic acid  1 mg Oral Daily   pantoprazole  40 mg Oral BID   rifaximin  1,100 mg Oral BID   Elmarie Shiley, MD 08/24/2021, 12:23 PM

## 2021-08-25 LAB — SODIUM
Sodium: 124 mmol/L — ABNORMAL LOW (ref 135–145)
Sodium: 122 mmol/L — ABNORMAL LOW (ref 135–145)

## 2021-08-25 LAB — BASIC METABOLIC PANEL
Anion gap: 6 (ref 5–15)
BUN: 15 mg/dL (ref 6–20)
CO2: 20 mmol/L — ABNORMAL LOW (ref 22–32)
Calcium: 7.8 mg/dL — ABNORMAL LOW (ref 8.9–10.3)
Chloride: 96 mmol/L — ABNORMAL LOW (ref 98–111)
Creatinine, Ser: 0.71 mg/dL (ref 0.61–1.24)
GFR, Estimated: >60 mL/min (ref >60)
Glucose, Bld: 105 mg/dL — ABNORMAL HIGH (ref 70–99)
Potassium: 4.8 mmol/L (ref 3.5–5.1)
Sodium: 122 mmol/L — ABNORMAL LOW (ref 135–145)

## 2021-08-25 LAB — CBC
HCT: 26.8 % — ABNORMAL LOW (ref 39.0–52.0)
Hemoglobin: 9.7 g/dL — ABNORMAL LOW (ref 13.0–17.0)
MCH: 34.2 pg — ABNORMAL HIGH (ref 26.0–34.0)
MCHC: 36.2 g/dL — ABNORMAL HIGH (ref 30.0–36.0)
MCV: 94.4 fL (ref 80.0–100.0)
Platelets: 54 10*3/uL — ABNORMAL LOW (ref 150–400)
RBC: 2.84 MIL/uL — ABNORMAL LOW (ref 4.22–5.81)
RDW: 16.4 % — ABNORMAL HIGH (ref 11.5–15.5)
WBC: 4.4 10*3/uL (ref 4.0–10.5)
nRBC: 0 % (ref 0.0–0.2)

## 2021-08-25 NOTE — Discharge Summary (Signed)
Physician Discharge Summary  Ian Hall:[id number] DOB: 1966-08-11 DOA: 08/22/2021  PCP: Maximiano Coss, NP  Admit date: 08/22/2021 Discharge date: 08/25/2021  Admitted From: Home Disposition: Home  Recommendations for Outpatient Follow-up:  Follow up with PCP in 1-2 weeks Please obtain BMP/CBC in one week Please follow up with liver transplant team in [place] on Friday as scheduled  Home Health: None Equipment/Devices: None  Discharge Condition: Stable CODE STATUS: Full Diet recommendation: Fluid restriction 1 to 2 L as discussed  Brief/Interim Summary: Ian Hall is a 55 y.o. male, with PMH of alcoholic cirrhosis, anxiety, chronic hyponatremia, mild persistent reactive airway disease, who presented to the ER on 08/22/2021 with generalized weakness.  In the ED patient found to have profound hyponatremia, down from baseline dropping as low as 114 at admission.    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   Decompensated alcoholic cirrhosis - Chronic, ongoing, follows outpatient with GI here as well as transplant facility in [place] - Continue home rifaximin and lactulose - Hold home diuretics given hyponatremia - Paracentesis 3.6L clear amber fluid drawn off 08/24/21 - Discontinue ceftriaxone, last paracentesis 08/11/2021 remains without fever or leukocytosis or abdominal pain  Hyperkalemia - Resolved with Lokelma, follow repeat labs - Likely secondary to home spironolactone   Mild reactive airway disease - Continue home meds/regiment   Generalized weakness -Suspect secondary to hyponatremia - resolving - Fall precautions   Discharge Diagnoses:  Active Problems:   Cirrhosis of liver with ascites (HCC)   Hyperbilirubinemia   Hyponatremia   Hyperkalemia   Reactive  airway disease    Discharge Instructions  Discharge Instructions     Diet - low sodium heart healthy   Complete by: As directed    Limit fluid intake to 1-2L daily   Increase activity slowly   Complete by: As directed       Allergies as of 08/25/2021       Reactions   Hydrocodone Itching, Other (See Comments)   Noted with hydrocodone cough syrup. Itching only, no hives.  02/03/16: denied itching with recent hydrocodone cough syrup. Only one episode of itching previously.         Medication List     TAKE these medications    albuterol 108 (90 Base) MCG/ACT inhaler Commonly known as: VENTOLIN HFA INHALE 2 PUFFS INTO THE LUNGS EVERY 6 HOURS AS NEEDED FOR WHEEZING OR SHORTNESS OF BREATH   bethanechol 10 MG tablet Commonly known as: URECHOLINE Take 10 mg by mouth 3 (three) times daily.   dicyclomine 10 MG capsule Commonly known as: BENTYL TAKE 1 CAPSULE BY MOUTH 4 TIMES A DAY BEFORE MEALS AND AT BEDTIME   fluticasone furoate-vilanterol 100-25 MCG/INH Aepb Commonly known as: Breo Ellipta Inhale 1 puff into the lungs daily.   folic acid 1 MG tablet Commonly known as: FOLVITE TAKE 1 TABLET BY MOUTH EVERY DAY   furosemide 20 MG tablet Commonly known as: Lasix Take 1 tablet (20 mg total) by mouth daily.   hydrOXYzine 10 MG tablet Commonly known as: ATARAX/VISTARIL TAKE 1/2 TO 1 TABLET(5 TO 10 MG) BY MOUTH AT BEDTIME AS NEEDED FOR ITCHING   lactulose 10 GM/15ML solution Commonly known as: CHRONULAC Take 10 g by mouth 3 (three) times daily as needed for mild constipation.  meclizine 12.5 MG tablet Commonly known as: ANTIVERT TAKE 1 TABLET(12.5 MG) BY MOUTH DAILY AS NEEDED FOR DIZZINESS   pantoprazole 40 MG tablet Commonly known as: PROTONIX Take 1 tablet (40 mg total) by mouth 2 (two) times daily.   sertraline 50 MG tablet Commonly known as: ZOLOFT Take 50 mg by mouth daily.   spironolactone 100 MG tablet Commonly known as: ALDACTONE TAKE 1 TABLET BY  MOUTH EVERY DAY   Xifaxan 550 MG Tabs tablet Generic drug: rifaximin TAKE 2 TABLETS BY MOUTH TWICE A DAY        Allergies  Allergen Reactions   Hydrocodone Itching and Other (See Comments)    Noted with hydrocodone cough syrup. Itching only, no hives.  02/03/16: denied itching with recent hydrocodone cough syrup. Only one episode of itching previously.     Consultations: Nephrology  Procedures/Studies: US Paracentesis  Result Date: 08/24/2021 INDICATION: Patient with history of cirrhosis presents today ascites. Request for therapeutic paracentesis. EXAM: ULTRASOUND GUIDED THERAPEUTIC PARACENTESIS MEDICATIONS: 36m 1% lidocaine COMPLICATIONS: None immediate. PROCEDURE: Informed written consent was obtained from the patient after a discussion of the risks, benefits and alternatives to treatment. A timeout was performed prior to the initiation of the procedure. Initial ultrasound scanning demonstrates a moderate amount of ascites within the right lower abdominal quadrant. The right lower abdomen was prepped and draped in the usual sterile fashion. 1% lidocaine was used for local anesthesia. Following this, a 6 Fr Safe-T-Centesis catheter was introduced. An ultrasound image was saved for documentation purposes. The paracentesis was performed. The catheter was removed and a dressing was applied. The patient tolerated the procedure well without immediate post procedural complication. FINDINGS: A total of approximately 3.6 L of clear, amber fluid was removed. IMPRESSION: Successful ultrasound-guided paracentesis yielding 3.6 liters of peritoneal fluid. Read by: SNarda Rutherford NP Electronically Signed   By: FMiachel RouxM.D.   On: 08/24/2021 16:11   DG Chest Port 1 View  Result Date: 08/22/2021 CLINICAL DATA:  Shortness of breath EXAM: PORTABLE CHEST 1 VIEW COMPARISON:  02/26/2021 FINDINGS: The heart size and mediastinal contours are within normal limits. Both lungs are clear. The visualized skeletal  structures are unremarkable. IMPRESSION: No acute abnormality of the lungs in AP portable projection. Electronically Signed   By: AEddie CandleM.D.   On: 08/22/2021 14:54   IR Paracentesis  Result Date: 08/11/2021 INDICATION: Patient with a history of cirrhosis presents today with ascites. Interventional radiology asked to perform a therapeutic and diagnostic paracentesis. EXAM: ULTRASOUND GUIDED PARACENTESIS MEDICATIONS: 1% lidocaine 10 mL COMPLICATIONS: None immediate. PROCEDURE: Informed written consent was obtained from the patient after a discussion of the risks, benefits and alternatives to treatment. A timeout was performed prior to the initiation of the procedure. Initial ultrasound scanning demonstrates a large amount of ascites within the right lower abdominal quadrant. The right lower abdomen was prepped and draped in the usual sterile fashion. 1% lidocaine was used for local anesthesia. Following this, a 19 gauge, 7-cm, Yueh catheter was introduced. An ultrasound image was saved for documentation purposes. The paracentesis was performed. The catheter was removed and a dressing was applied. The patient tolerated the procedure well without immediate post procedural complication. Patient received post-procedure intravenous albumin; see nursing notes for details. FINDINGS: A total of approximately 4.6 L of clear yellow fluid was removed. Samples were sent to the laboratory as requested by the clinical team. IMPRESSION: Successful ultrasound-guided paracentesis yielding 4.6 liters of peritoneal fluid. Read by: JSoyla Dryer NP Electronically Signed  By: Sandi Mariscal M.D.   On: 08/11/2021 14:47     Subjective: No acute issues or events overnight, feels back to baseline otherwise stable and agreeable for discharge home   Discharge Exam: Vitals:   08/25/21 0814 08/25/21 1035  BP: 113/73   Pulse: 72   Resp: 15   Temp: 98.5 F (36.9 C)   SpO2: 98% 98%   Vitals:   08/25/21 0003 08/25/21 0421  08/25/21 0814 08/25/21 1035  BP: (!) 100/54 119/65 113/73   Pulse: 82 74 72   Resp: 14 14 15    Temp: 98.2 F (36.8 C) 98.3 F (36.8 C) 98.5 F (36.9 C)   TempSrc: Oral Oral Oral   SpO2: 100% 100% 98% 98%  Weight:      Height:        General: Pt is alert, awake, not in acute distress Cardiovascular: RRR, S1/S2 +, no rubs, no gallops Respiratory: CTA bilaterally, no wheezing, no rhonchi Abdominal: Soft, NT, ND, bowel sounds + Extremities: no edema, no cyanosis    The results of significant diagnostics from this hospitalization (including imaging, microbiology, ancillary and laboratory) are listed below for reference.     Microbiology: Recent Results (from the past 240 hour(s))  Resp Panel by RT-PCR (Flu A&B, Covid) Nasopharyngeal Swab     Status: None   Collection Time: 08/22/21  3:42 PM   Specimen: Nasopharyngeal Swab; Nasopharyngeal(NP) swabs in vial transport medium  Result Value Ref Range Status   SARS Coronavirus 2 by RT PCR NEGATIVE NEGATIVE Final    Comment: (NOTE) SARS-CoV-2 target nucleic acids are NOT DETECTED.  The SARS-CoV-2 RNA is generally detectable in upper respiratory specimens during the acute phase of infection. The lowest concentration of SARS-CoV-2 viral copies this assay can detect is 138 copies/mL. A negative result does not preclude SARS-Cov-2 infection and should not be used as the sole basis for treatment or other patient management decisions. A negative result may occur with  improper specimen collection/handling, submission of specimen other than nasopharyngeal swab, presence of viral mutation(s) within the areas targeted by this assay, and inadequate number of viral copies(<138 copies/mL). A negative result must be combined with clinical observations, patient history, and epidemiological information. The expected result is Negative.  Fact Sheet for Patients:  EntrepreneurPulse.com.au  Fact Sheet for Healthcare Providers:   IncredibleEmployment.be  This test is no t yet approved or cleared by the [place] FDA and  has been authorized for detection and/or diagnosis of SARS-CoV-2 by FDA under an Emergency Use Authorization (EUA). This EUA will remain  in effect (meaning this test can be used) for the duration of the COVID-19 declaration under Section 564(b)(1) of the Act, 21 U.S.C.section 360bbb-3(b)(1), unless the authorization is terminated  or revoked sooner.       Influenza A by PCR NEGATIVE NEGATIVE Final   Influenza B by PCR NEGATIVE NEGATIVE Final    Comment: (NOTE) The Xpert Xpress SARS-CoV-2/FLU/RSV plus assay is intended as an aid in the diagnosis of influenza from Nasopharyngeal swab specimens and should not be used as a sole basis for treatment. Nasal washings and aspirates are unacceptable for Xpert Xpress SARS-CoV-2/FLU/RSV testing.  Fact Sheet for Patients: EntrepreneurPulse.com.au  Fact Sheet for Healthcare Providers: IncredibleEmployment.be  This test is not yet approved or cleared by the [place] FDA and has been authorized for detection and/or diagnosis of SARS-CoV-2 by FDA under an Emergency Use Authorization (EUA). This EUA will remain in effect (meaning this test can be used) for the  duration of the COVID-19 declaration under Section 564(b)(1) of the Act, 21 U.S.C. section 360bbb-3(b)(1), unless the authorization is terminated or revoked.  Performed at [facility], [place] [address], [place], [place] [address]   MRSA Next Gen by PCR, Nasal     Status: None   Collection Time: 08/22/21 10:50 PM   Specimen: Nasal Mucosa; Nasal Swab  Result Value Ref Range Status   MRSA by PCR Next Gen NOT DETECTED NOT DETECTED Final    Comment: (NOTE) The GeneXpert MRSA Assay (FDA approved for NASAL specimens only), is one component of a comprehensive MRSA colonization surveillance program. It is not intended  to diagnose MRSA infection nor to guide or monitor treatment for MRSA infections. Test performance is not FDA approved in patients less than 30 years old. Performed at [facility], [place] [address] [place], [place] [address]      Labs: BNP (last 3 results) No results for input(s): BNP in the last 8760 hours. Basic Metabolic Panel: Recent Labs  Lab 08/22/21 1500 08/22/21 1817 08/22/21 2014 08/22/21 2141 08/23/21 0149 08/23/21 0530 08/24/21 0244 08/24/21 0640 08/24/21 1747 08/24/21 2149 08/25/21 0145 08/25/21 0541 08/25/21 0941  NA 118*   < > 114*   < > 115*   < > 124*   < > 124* 125* 122* 124* 122*  K 5.8*  --  4.6  --  4.8  --  4.9  --   --   --  4.8  --   --   CL 94*  --  86*  --  86*  --  98  --   --   --  96*  --   --   CO2 20*  --  21*  --  21*  --  21*  --   --   --  20*  --   --   GLUCOSE 102*  --  113*  --  113*  --  94  --   --   --  105*  --   --   BUN 16  --  16  --  16  --  14  --   --   --  15  --   --   CREATININE 0.55*  --  0.73  --  0.63  --  0.74  --   --   --  0.71  --   --   CALCIUM 8.0*  --  8.2*  --  8.1*  --  8.2*  --   --   --  7.8*  --   --   MG  --   --   --   --  1.5*  --   --   --   --   --   --   --   --   PHOS  --   --   --   --  3.2  --   --   --   --   --   --   --   --    < > = values in this interval not displayed.   Liver Function Tests: Recent Labs  Lab 08/22/21 1246 08/22/21 2014 08/23/21 0149  AST 207* 176* 173*  ALT 60* 51* 49*  ALKPHOS 331* 286* 278*  BILITOT 12.4* 10.5* 10.4*  PROT 5.6* 5.1* 5.1*  ALBUMIN 2.7* 2.6* 2.7*   Recent Labs  Lab 08/22/21 1246  LIPASE 48   Recent Labs  Lab 08/22/21  1416  AMMONIA 44*   CBC: Recent Labs  Lab 08/22/21 1246 08/23/21 0149 08/24/21 0244 08/25/21 0145  WBC 7.1 4.7 4.1 4.4  HGB 11.5* 10.4* 9.6* 9.7*  HCT 31.4* 28.6* 26.6* 26.8*  MCV 92.4 92.9 92.7 94.4  PLT 62* 51* 53* 54*   Cardiac Enzymes: No results for input(s): CKTOTAL, CKMB, CKMBINDEX,  TROPONINI in the last 168 hours. BNP: Invalid input(s): POCBNP CBG: No results for input(s): GLUCAP in the last 168 hours. D-Dimer No results for input(s): DDIMER in the last 72 hours. Hgb A1c No results for input(s): HGBA1C in the last 72 hours. Lipid Profile No results for input(s): CHOL, HDL, LDLCALC, TRIG, CHOLHDL, LDLDIRECT in the last 72 hours. Thyroid function studies No results for input(s): TSH, T4TOTAL, T3FREE, THYROIDAB in the last 72 hours.  Invalid input(s): FREET3 Anemia work up Recent Labs    08/24/21 0640  FERRITIN 558*  TIBC 161*  IRON 142   Urinalysis    Component Value Date/Time   COLORURINE AMBER (A) 08/22/2021 1512   APPEARANCEUR CLEAR 08/22/2021 1512   APPEARANCEUR Cloudy (A) 01/14/2021 1630   LABSPEC 1.025 08/22/2021 1512   PHURINE 6.0 08/22/2021 1512   GLUCOSEU NEGATIVE 08/22/2021 1512   GLUCOSEU NEGATIVE 03/10/2021 1238   HGBUR SMALL (A) 08/22/2021 1512   BILIRUBINUR MODERATE (A) 08/22/2021 1512   BILIRUBINUR Negative 01/14/2021 [place] 08/22/2021 1512   PROTEINUR NEGATIVE 08/22/2021 1512   UROBILINOGEN 0.2 03/10/2021 1238   NITRITE NEGATIVE 08/22/2021 1512   [place] 08/22/2021 1512   Sepsis Labs Invalid input(s): PROCALCITONIN,  WBC,  LACTICIDVEN Microbiology Recent Results (from the past 240 hour(s))  Resp Panel by RT-PCR (Flu A&B, Covid) Nasopharyngeal Swab     Status: None   Collection Time: 08/22/21  3:42 PM   Specimen: Nasopharyngeal Swab; Nasopharyngeal(NP) swabs in vial transport medium  Result Value Ref Range Status   SARS Coronavirus 2 by RT PCR NEGATIVE NEGATIVE Final    Comment: (NOTE) SARS-CoV-2 target nucleic acids are NOT DETECTED.  The SARS-CoV-2 RNA is generally detectable in upper respiratory specimens during the acute phase of infection. The lowest concentration of SARS-CoV-2 viral copies this assay can detect is 138 copies/mL. A negative result does not preclude SARS-Cov-2 infection  and should not be used as the sole basis for treatment or other patient management decisions. A negative result may occur with  improper specimen collection/handling, submission of specimen other than nasopharyngeal swab, presence of viral mutation(s) within the areas targeted by this assay, and inadequate number of viral copies(<138 copies/mL). A negative result must be combined with clinical observations, patient history, and epidemiological information. The expected result is Negative.  Fact Sheet for Patients:  EntrepreneurPulse.com.au  Fact Sheet for Healthcare Providers:  IncredibleEmployment.be  This test is no t yet approved or cleared by the [place] FDA and  has been authorized for detection and/or diagnosis of SARS-CoV-2 by FDA under an Emergency Use Authorization (EUA). This EUA will remain  in effect (meaning this test can be used) for the duration of the COVID-19 declaration under Section 564(b)(1) of the Act, 21 U.S.C.section 360bbb-3(b)(1), unless the authorization is terminated  or revoked sooner.       Influenza A by PCR NEGATIVE NEGATIVE Final   Influenza B by PCR NEGATIVE NEGATIVE Final    Comment: (NOTE) The Xpert Xpress SARS-CoV-2/FLU/RSV plus assay is intended as an aid in the diagnosis of influenza from Nasopharyngeal swab specimens and should not be used as a sole basis  for treatment. Nasal washings and aspirates are unacceptable for Xpert Xpress SARS-CoV-2/FLU/RSV testing.  Fact Sheet for Patients: EntrepreneurPulse.com.au  Fact Sheet for Healthcare Providers: IncredibleEmployment.be  This test is not yet approved or cleared by the [place] FDA and has been authorized for detection and/or diagnosis of SARS-CoV-2 by FDA under an Emergency Use Authorization (EUA). This EUA will remain in effect (meaning this test can be used) for the duration of the COVID-19 declaration  under Section 564(b)(1) of the Act, 21 U.S.C. section 360bbb-3(b)(1), unless the authorization is terminated or revoked.  Performed at [facility], [place] [address], [place], [place] [address]   MRSA Next Gen by PCR, Nasal     Status: None   Collection Time: 08/22/21 10:50 PM   Specimen: Nasal Mucosa; Nasal Swab  Result Value Ref Range Status   MRSA by PCR Next Gen NOT DETECTED NOT DETECTED Final    Comment: (NOTE) The GeneXpert MRSA Assay (FDA approved for NASAL specimens only), is one component of a comprehensive MRSA colonization surveillance program. It is not intended to diagnose MRSA infection nor to guide or monitor treatment for MRSA infections. Test performance is not FDA approved in patients less than 26 years old. Performed at [facility], [place] [address], [place], [place] [address]      Time coordinating discharge: Over 30 minutes  SIGNED:   Little Ishikawa, DO [facility] 08/25/2021, 11:54 AM Pager   If 7PM-7AM, please contact night-coverage [email or web address]

## 2021-08-27 ENCOUNTER — Telehealth: Payer: Self-pay

## 2021-08-27 NOTE — Telephone Encounter (Signed)
Transition Care Management Follow-up Telephone Call Date of discharge and from where: 08/25/2021  [facility] How have you been since you were released from the hospital? "Good so far" Any questions or concerns? No  Items Reviewed: Did the pt receive and understand the discharge instructions provided? y Medications obtained and verified? Yes  Other? No  Any new allergies since your discharge? No  Dietary orders reviewed? Yes Do you have support at home? Yes   Home Care and Equipment/Supplies: Were home health services ordered? not applicable If so, what is the name of the agency?  Has the agency set up a time to come to the patient's home? not applicable Were any new equipment or medical supplies ordered?   What is the name of the medical supply agency?  Were you able to get the supplies/equipment?  Do you have any questions related to the use of the equipment or supplies?   Functional Questionnaire: (I = Independent and D = Dependent) ADLs: I  Bathing/Dressing- I  Meal Prep- I  Eating- I  Maintaining continence- I  Transferring/Ambulation- I  Managing Meds- I  Follow up appointments reviewed:  PCP Hospital f/u appt confirmed? No   Specialist Hospital f/u appt confirmed? Yes   Are transportation arrangements needed?  If their condition worsens, is the pt aware to call PCP or go to the Emergency Dept.? yes Was the patient provided with contact information for the PCP's office or ED? Yes Was to pt encouraged to call back with questions or concerns? Yes  Tomasa Rand, RN, BSN, CEN [facility] [employer] [phone]

## 2021-09-03 ENCOUNTER — Ambulatory Visit (INDEPENDENT_AMBULATORY_CARE_PROVIDER_SITE_OTHER): Payer: No Typology Code available for payment source | Admitting: Registered Nurse

## 2021-09-03 ENCOUNTER — Other Ambulatory Visit: Payer: Self-pay

## 2021-09-03 ENCOUNTER — Encounter: Payer: Self-pay | Admitting: Registered Nurse

## 2021-09-03 VITALS — BP 108/51 | HR 74 | Temp 97.9°F | Resp 19 | Ht 67.0 in | Wt 214.6 lb

## 2021-09-03 DIAGNOSIS — K7031 Alcoholic cirrhosis of liver with ascites: Secondary | ICD-10-CM

## 2021-09-03 DIAGNOSIS — Z09 Encounter for follow-up examination after completed treatment for conditions other than malignant neoplasm: Secondary | ICD-10-CM

## 2021-09-07 ENCOUNTER — Other Ambulatory Visit: Payer: Self-pay | Admitting: Physician Assistant

## 2021-09-07 NOTE — Progress Notes (Signed)
Established Patient Office Visit  Subjective:  Patient ID: Ian Hall, male    DOB: 12-17-1965  Age: 55 y.o. MRN: [id number]  CC:  Chief Complaint  Patient presents with   Follow-up    HPI Ian Hall presents for hosp follow up   Doing well - had brief episode of decompensation of liver failure but lytes stabilized.   He is very optimistic today - he has a number of upcoming appointments with [facility]'s liver transplant team and he should be on the list soon  Feeling great, relatively. No acute concerns.   Past Medical History:  Diagnosis Date   Anxiety    Cirrhosis ([place])    COVID-19     Past Surgical History:  Procedure Laterality Date   ANKLE SURGERY Right    IR PARACENTESIS  01/07/2021   IR PARACENTESIS  01/30/2021   IR PARACENTESIS  05/12/2021   IR PARACENTESIS  05/27/2021   IR PARACENTESIS  08/11/2021   IR TRANSCATHETER BX  01/09/2021   IR US GUIDE VASC ACCESS RIGHT  01/09/2021   IR VENOGRAM HEPATIC W HEMODYNAMIC EVALUATION  01/09/2021    Family History  Problem Relation Age of Onset   Diabetes Mother    Memory loss Mother    Diabetes Father    Diabetes Cousin    Colon cancer Neg Hx     Social History   Socioeconomic History   Marital status: Married    Spouse name: Not on file   Number of children: 0   Years of education: Not on file   Highest education level: Not on file  Occupational History   Occupation: IT sales professional: [employer]  Tobacco Use   Smoking status: Former    Packs/day: 0.50    Years: 23.00    Pack years: 11.50    Types: Cigarettes    Start date: 1981    Quit date: 12/16/2002    Years since quitting: 18.7   Smokeless tobacco: Never  Vaping Use   Vaping Use: Never used  Substance and Sexual Activity   Alcohol use: Not Currently    Comment: none for 3-4 years (stated 12/16/2020)   Drug use: No   Sexual activity: Yes    Birth control/protection: None  Other Topics Concern   Not on file  Social  History Narrative   Not on file   Social Determinants of Health   Financial Resource Strain: Not on file  Food Insecurity: Not on file  Transportation Needs: Not on file  Physical Activity: Not on file  Stress: Not on file  Social Connections: Not on file  Intimate Partner Violence: Not on file    Outpatient Medications Prior to Visit  Medication Sig Dispense Refill   albuterol (VENTOLIN HFA) 108 (90 Base) MCG/ACT inhaler INHALE 2 PUFFS INTO THE LUNGS EVERY 6 HOURS AS NEEDED FOR WHEEZING OR SHORTNESS OF BREATH 6.7 g 3   bethanechol (URECHOLINE) 10 MG tablet Take 10 mg by mouth 3 (three) times daily.     fluticasone furoate-vilanterol (BREO ELLIPTA) 100-25 MCG/INH AEPB Inhale 1 puff into the lungs daily. 28 each 6   folic acid (FOLVITE) 1 MG tablet TAKE 1 TABLET BY MOUTH EVERY DAY 30 tablet 1   furosemide (LASIX) 20 MG tablet Take 1 tablet (20 mg total) by mouth daily. 30 tablet 6   hydrOXYzine (ATARAX/VISTARIL) 10 MG tablet TAKE 1/2 TO 1 TABLET(5 TO 10 MG) BY MOUTH AT BEDTIME AS NEEDED FOR ITCHING 90  tablet 0   lactulose (CHRONULAC) 10 GM/15ML solution Take 10 g by mouth 3 (three) times daily as needed for mild constipation.     meclizine (ANTIVERT) 12.5 MG tablet TAKE 1 TABLET(12.5 MG) BY MOUTH DAILY AS NEEDED FOR DIZZINESS 30 tablet 2   pantoprazole (PROTONIX) 40 MG tablet Take 1 tablet (40 mg total) by mouth 2 (two) times daily. 60 tablet 0   sertraline (ZOLOFT) 50 MG tablet Take 50 mg by mouth daily.     spironolactone (ALDACTONE) 100 MG tablet TAKE 1 TABLET BY MOUTH EVERY DAY 90 tablet 2   XIFAXAN 550 MG TABS tablet TAKE 2 TABLETS BY MOUTH TWICE A DAY 120 tablet 1   dicyclomine (BENTYL) 10 MG capsule TAKE 1 CAPSULE BY MOUTH 4 TIMES A DAY BEFORE MEALS AND AT BEDTIME 120 capsule 1   No facility-administered medications prior to visit.    Allergies  Allergen Reactions   Hydrocodone Itching and Other (See Comments)    Noted with hydrocodone cough syrup. Itching only, no hives.   02/03/16: denied itching with recent hydrocodone cough syrup. Only one episode of itching previously.     ROS Review of Systems  Constitutional: Negative.   HENT: Negative.    Eyes: Negative.   Respiratory: Negative.    Cardiovascular: Negative.   Gastrointestinal: Negative.   Genitourinary: Negative.   Musculoskeletal: Negative.   Skin: Negative.   Neurological: Negative.   Psychiatric/Behavioral: Negative.    All other systems reviewed and are negative.    Objective:    Physical Exam Constitutional:      General: He is not in acute distress.    Appearance: Normal appearance. He is normal weight. He is not ill-appearing, toxic-appearing or diaphoretic.  Cardiovascular:     Rate and Rhythm: Normal rate and regular rhythm.     Heart sounds: Normal heart sounds. No murmur heard.   No friction rub. No gallop.  Pulmonary:     Effort: Pulmonary effort is normal. No respiratory distress.     Breath sounds: Normal breath sounds. No stridor. No wheezing, rhonchi or rales.  Chest:     Chest wall: No tenderness.  Skin:    General: Skin is warm and dry.     Coloration: Skin is jaundiced. Skin is not pale.     Findings: No bruising, erythema, lesion or rash.  Neurological:     General: No focal deficit present.     Mental Status: He is alert and oriented to person, place, and time. Mental status is at baseline.  Psychiatric:        Mood and Affect: Mood normal.        Behavior: Behavior normal.        Thought Content: Thought content normal.        Judgment: Judgment normal.    BP (!) 108/51   Pulse 74   Temp 97.9 F (36.6 C) (Temporal)   Resp 19   Ht 5' 7"  (1.702 m)   Wt 214 lb 9.6 oz (97.3 kg)   SpO2 100%   BMI 33.61 kg/m  Wt Readings from Last 3 Encounters:  09/03/21 214 lb 9.6 oz (97.3 kg)  08/24/21 225 lb 5 oz (102.2 kg)  07/20/21 210 lb 12.8 oz (95.6 kg)     Health Maintenance Due  Topic Date Due   COVID-19 Vaccine (3 - Booster for Pfizer series)  07/31/2020   Zoster Vaccines- Shingrix (2 of 2) 07/17/2021    There are no preventive care reminders to  display for this patient.  Lab Results  Component Value Date   TSH 2.534 04/23/2021   Lab Results  Component Value Date   WBC 4.4 08/25/2021   HGB 9.7 (L) 08/25/2021   HCT 26.8 (L) 08/25/2021   MCV 94.4 08/25/2021   PLT 54 (L) 08/25/2021   Lab Results  Component Value Date   NA 122 (L) 08/25/2021   K 4.8 08/25/2021   CO2 20 (L) 08/25/2021   GLUCOSE 105 (H) 08/25/2021   BUN 15 08/25/2021   CREATININE 0.71 08/25/2021   BILITOT 10.4 (H) 08/23/2021   ALKPHOS 278 (H) 08/23/2021   AST 173 (H) 08/23/2021   ALT 49 (H) 08/23/2021   PROT 5.1 (L) 08/23/2021   ALBUMIN 2.7 (L) 08/23/2021   CALCIUM 7.8 (L) 08/25/2021   ANIONGAP 6 08/25/2021   EGFR 104 02/04/2021   GFR 97.41 05/15/2021   Lab Results  Component Value Date   CHOL 132 01/14/2021   Lab Results  Component Value Date   HDL 12 (L) 01/14/2021   Lab Results  Component Value Date   LDLCALC 96 01/14/2021   Lab Results  Component Value Date   TRIG 96 02/25/2021   Lab Results  Component Value Date   CHOLHDL 11.0 (H) 01/14/2021   Lab Results  Component Value Date   HGBA1C 4.3 (L) 02/04/2021      Assessment & Plan:   Problem List Items Addressed This Visit       Digestive   Cirrhosis of liver with ascites ([place]) - Primary   Other Visit Diagnoses     Hospital discharge follow-up           No orders of the defined types were placed in this encounter.   Follow-up: No follow-ups on file.   PLAN Continue current meds Will hold labs today- he certainly will get them done with transplant team and/or Dr. Tarri Glenn.  Return q3 mo, sooner with concerns Patient encouraged to call clinic with any questions, comments, or concerns.  Maximiano Coss, NP

## 2021-09-11 ENCOUNTER — Other Ambulatory Visit: Payer: Self-pay | Admitting: Physician Assistant

## 2021-09-15 ENCOUNTER — Ambulatory Visit: Payer: No Typology Code available for payment source | Admitting: Gastroenterology

## 2021-09-21 NOTE — Progress Notes (Signed)
Established Patient Office Visit  Subjective:  Patient ID: Ian Hall, male    DOB: October 30, 1966  Age: 55 y.o. MRN: [id number]  CC:  Chief Complaint  Patient presents with   Hospitalization Follow-up    Pt here to follow up on Hospital visit, wants to make sure he is on the right track     HPI [name] presents for follow up on hospitalization  Recently admitted with decompensated cirrhosis with hepatic encephalopathy. Noted to have acute respiratory failure secondary to this. NG and OG tubes placed. Pt stabilized, d/c after 5 day admission.  Doing well since d/c. No new symptoms suggestive of decompensation. No new ascities, worsening jaundice, neuro or cognitive symptoms.   Wants to discuss ways that he can stay out of the hospital.  Fortunately he is on the transplant list at [facility]. He will have to have 6 months without any EtOH intake before he can be considered - last EtOH in December 2021.  Otherwise notes he needs a refill on nadolol. Has taken without AE. Bp stable today. HR stable.   Past Medical History:  Diagnosis Date   Anxiety    Cirrhosis ([place])    COVID-19     Past Surgical History:  Procedure Laterality Date   ANKLE SURGERY Right    IR PARACENTESIS  01/07/2021   IR PARACENTESIS  01/30/2021   IR PARACENTESIS  05/12/2021   IR PARACENTESIS  05/27/2021   IR PARACENTESIS  08/11/2021   IR TRANSCATHETER BX  01/09/2021   IR US GUIDE VASC ACCESS RIGHT  01/09/2021   IR VENOGRAM HEPATIC W HEMODYNAMIC EVALUATION  01/09/2021    Family History  Problem Relation Age of Onset   Diabetes Mother    Memory loss Mother    Diabetes Father    Diabetes Cousin    Colon cancer Neg Hx     Social History   Socioeconomic History   Marital status: Married    Spouse name: Not on file   Number of children: 0   Years of education: Not on file   Highest education level: Not on file  Occupational History   Occupation: IT sales professional: [employer]  Tobacco Use   Smoking status: Former    Packs/day: 0.50    Years: 23.00    Pack years: 11.50    Types: Cigarettes    Start date: 1981    Quit date: 12/16/2002    Years since quitting: 18.7   Smokeless tobacco: Never  Vaping Use   Vaping Use: Never used  Substance and Sexual Activity   Alcohol use: Not Currently    Comment: none for 3-4 years (stated 12/16/2020)   Drug use: No   Sexual activity: Yes    Birth control/protection: None  Other Topics Concern   Not on file  Social History Narrative   Not on file   Social Determinants of Health   Financial Resource Strain: Not on file  Food Insecurity: Not on file  Transportation Needs: Not on file  Physical Activity: Not on file  Stress: Not on file  Social Connections: Not on file  Intimate Partner Violence: Not on file    Outpatient Medications Prior to Visit  Medication Sig Dispense Refill   dicyclomine (BENTYL) 10 MG capsule Take 1 capsule (10 mg total) by mouth 4 (four) times daily -  before meals and at bedtime. 169 capsule 2   folic acid (FOLVITE) 1 MG tablet Take 1  tablet (1 mg total) by mouth daily. 30 tablet 1   furosemide (LASIX) 40 MG tablet Take 1 tablet (40 mg total) by mouth daily. 90 tablet 0   lactulose (CHRONULAC) 10 GM/15ML solution Take 45 mLs (30 g total) by mouth 3 (three) times daily. 12150 mL 0   pantoprazole (PROTONIX) 40 MG tablet Take 1 tablet (40 mg total) by mouth 2 (two) times daily. 60 tablet 0   rifaximin (XIFAXAN) 550 MG TABS tablet Take 1 tablet (550 mg total) by mouth 2 (two) times daily. 180 tablet 0   spironolactone (ALDACTONE) 100 MG tablet Take 1 tablet (100 mg total) by mouth daily. 90 tablet 0   warfarin (COUMADIN) 2 MG tablet Take 2 mg by mouth daily.     hydrOXYzine (ATARAX/VISTARIL) 10 MG tablet Take 0.5-1 tablets (5-10 mg total) by mouth at bedtime as needed for itching. (Patient not taking: Reported on 03/10/2021) 30 tablet 0   levETIRAcetam (KEPPRA) 500 MG tablet Take  1 tablet (500 mg total) by mouth 2 (two) times daily for 2 days. 4 tablet 0   thiamine 100 MG tablet Take 1 tablet (100 mg total) by mouth daily. (Patient not taking: No sig reported) 30 tablet 1   No facility-administered medications prior to visit.    Allergies  Allergen Reactions   Hydrocodone Itching and Other (See Comments)    Noted with hydrocodone cough syrup. Itching only, no hives.  02/03/16: denied itching with recent hydrocodone cough syrup. Only one episode of itching previously.     ROS Review of Systems  Constitutional: Negative.   HENT: Negative.    Eyes: Negative.   Respiratory: Negative.    Cardiovascular: Negative.   Gastrointestinal: Negative.   Genitourinary: Negative.   Musculoskeletal: Negative.   Skin:  Positive for color change (baseline jaundice).  Neurological: Negative.   Psychiatric/Behavioral: Negative.    All other systems reviewed and are negative.    Objective:    Physical Exam Constitutional:      General: He is not in acute distress.    Appearance: Normal appearance. He is normal weight. He is not ill-appearing, toxic-appearing or diaphoretic.  Cardiovascular:     Rate and Rhythm: Normal rate and regular rhythm.     Heart sounds: Normal heart sounds. No murmur heard.   No friction rub. No gallop.  Pulmonary:     Effort: Pulmonary effort is normal. No respiratory distress.     Breath sounds: Normal breath sounds. No stridor. No wheezing, rhonchi or rales.  Chest:     Chest wall: No tenderness.  Abdominal:     General: There is distension (a limited amount of ascities, baseline).     Palpations: There is no mass.     Tenderness: There is no abdominal tenderness. There is no right CVA tenderness, left CVA tenderness, guarding or rebound.     Hernia: No hernia is present.  Skin:    Capillary Refill: Capillary refill takes less than 2 seconds.     Coloration: Skin is jaundiced (unchanged from baseline).  Neurological:     General: No  focal deficit present.     Mental Status: He is alert and oriented to person, place, and time. Mental status is at baseline.  Psychiatric:        Mood and Affect: Mood normal.        Behavior: Behavior normal.        Thought Content: Thought content normal.        Judgment: Judgment normal.  BP 122/64   Pulse 68   Temp 98.3 F (36.8 C) (Temporal)   Resp 16   Ht 5' 5"  (1.651 m)   Wt 206 lb 9.6 oz (93.7 kg)   SpO2 98%   BMI 34.38 kg/m  Wt Readings from Last 3 Encounters:  09/03/21 214 lb 9.6 oz (97.3 kg)  08/24/21 225 lb 5 oz (102.2 kg)  07/20/21 210 lb 12.8 oz (95.6 kg)     Health Maintenance Due  Topic Date Due   COVID-19 Vaccine (3 - Booster for Pfizer series) 04/25/2020    There are no preventive care reminders to display for this patient.  Lab Results  Component Value Date   TSH 2.534 04/23/2021   Lab Results  Component Value Date   WBC 4.4 08/25/2021   HGB 9.7 (L) 08/25/2021   HCT 26.8 (L) 08/25/2021   MCV 94.4 08/25/2021   PLT 54 (L) 08/25/2021   Lab Results  Component Value Date   NA 122 (L) 08/25/2021   K 4.8 08/25/2021   CO2 20 (L) 08/25/2021   GLUCOSE 105 (H) 08/25/2021   BUN 15 08/25/2021   CREATININE 0.71 08/25/2021   BILITOT 10.4 (H) 08/23/2021   ALKPHOS 278 (H) 08/23/2021   AST 173 (H) 08/23/2021   ALT 49 (H) 08/23/2021   PROT 5.1 (L) 08/23/2021   ALBUMIN 2.7 (L) 08/23/2021   CALCIUM 7.8 (L) 08/25/2021   ANIONGAP 6 08/25/2021   EGFR 104 02/04/2021   GFR 97.41 05/15/2021   Lab Results  Component Value Date   CHOL 132 01/14/2021   Lab Results  Component Value Date   HDL 12 (L) 01/14/2021   Lab Results  Component Value Date   LDLCALC 96 01/14/2021   Lab Results  Component Value Date   TRIG 96 02/25/2021   Lab Results  Component Value Date   CHOLHDL 11.0 (H) 01/14/2021   Lab Results  Component Value Date   HGBA1C 4.3 (L) 02/04/2021      Assessment & Plan:   Problem List Items Addressed This Visit        Cardiovascular and Mediastinum   Acute deep vein thrombosis (DVT) of right tibial vein (H[facility]) - Primary   Relevant Orders   Protime-INR (Completed)   Other Visit Diagnoses     Secondary esophageal varices without bleeding (H[facility])       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H[facility])       Relevant Orders   Comprehensive metabolic panel (Completed)   Ammonia (Completed)   Magnesium (Completed)   CBC with Differential/Platelet (Completed)   Phosphorus (Completed)   Urinalysis (Completed)       Meds ordered this encounter  Medications   DISCONTD: nadolol (CORGARD) 20 MG tablet    Sig: Take 1 tablet (20 mg total) by mouth daily.    Dispense:  90 tablet    Refill:  0    Order Specific Question:   Supervising Provider    Answer:   Carlota Raspberry, JEFFREY R [[id number]]    Follow-up: No follow-ups on file.   PLAN Repeat labs as above. Overall pt appears stable. Continue to encourage healthy lifestyle habits and regular follow up with myself, GI, and Liver Transplant Patient encouraged to call clinic with any questions, comments, or concerns.  Maximiano Coss, NP

## 2021-09-25 ENCOUNTER — Ambulatory Visit (INDEPENDENT_AMBULATORY_CARE_PROVIDER_SITE_OTHER): Payer: No Typology Code available for payment source | Admitting: Gastroenterology

## 2021-09-25 ENCOUNTER — Encounter: Payer: Self-pay | Admitting: Gastroenterology

## 2021-09-25 ENCOUNTER — Other Ambulatory Visit (INDEPENDENT_AMBULATORY_CARE_PROVIDER_SITE_OTHER): Payer: No Typology Code available for payment source

## 2021-09-25 VITALS — BP 102/68 | HR 88 | Ht 67.0 in | Wt 234.4 lb

## 2021-09-25 DIAGNOSIS — K7682 Hepatic encephalopathy: Secondary | ICD-10-CM

## 2021-09-25 DIAGNOSIS — E871 Hypo-osmolality and hyponatremia: Secondary | ICD-10-CM

## 2021-09-25 DIAGNOSIS — R609 Edema, unspecified: Secondary | ICD-10-CM

## 2021-09-25 DIAGNOSIS — K7031 Alcoholic cirrhosis of liver with ascites: Secondary | ICD-10-CM

## 2021-09-25 LAB — COMPREHENSIVE METABOLIC PANEL
ALT: 38 U/L (ref 0–53)
AST: 146 U/L — ABNORMAL HIGH (ref 0–37)
Albumin: 3 g/dL — ABNORMAL LOW (ref 3.5–5.2)
Alkaline Phosphatase: 314 U/L — ABNORMAL HIGH (ref 39–117)
BUN: 20 mg/dL (ref 6–23)
CO2: 22 mEq/L (ref 19–32)
Calcium: 8.2 mg/dL — ABNORMAL LOW (ref 8.4–10.5)
Chloride: 96 mEq/L (ref 96–112)
Creatinine, Ser: 0.94 mg/dL (ref 0.40–1.50)
GFR: 91.6 mL/min (ref >60.00)
Glucose, Bld: 133 mg/dL — ABNORMAL HIGH (ref 70–99)
Potassium: 4.5 mEq/L (ref 3.5–5.1)
Sodium: 124 mEq/L — ABNORMAL LOW (ref 135–145)
Total Bilirubin: 10.8 mg/dL — ABNORMAL HIGH (ref 0.2–1.2)
Total Protein: 5.4 g/dL — ABNORMAL LOW (ref 6.0–8.3)

## 2021-09-25 MED ORDER — ALBUMIN HUMAN 25 % IV SOLN: 100.0000 g | Freq: Once | INTRAVENOUS | Status: AC

## 2021-09-25 NOTE — Progress Notes (Signed)
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 with associated portal hypertension    - biopsy suggests possibility of DILI    - Patient denies use of medications prior to decompensation    - labs 01/15/20 show MELD of 25 Ascites    - Has required recurrent paracentesis     - no history of SBP    - recurrent symptoms Hepatic encephalopathy controlled on lactulose and Xifaxan Hyponatremia, requiring hospitalization for serum sodium 114, resolved Portal hypertension with ascites and splenomegaly 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  Abdominal imaging every 6 months for [facility] screening.  Hyponatremia: Continue sodium restriction, add 1.5L fluid restriction  Hepatic encephalopathy: Clinically controlled on lactulose and Xifaximin. No dose charges today.   Ascites and peripheral edema: Management is becoming increasing more difficult. Continue 2000 mg sodium restricted diet, titrate diuretics as tolerated. Labs today to follow his renal function. Arrange for LVP with IV albumin. Would like to consider TIPS when he is deemed a transplant candidate although his hyponatremia may make him too high risk.   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 No obvious mass. High risk for cholecystectomy at this time. Discussed potential symptoms of symptomatic gallbladder disease.    PLAN: - CMP today - Continue 2000 mg sodium restricted diet - Start 1.5L sodium restriction - Increase diuretics to Aldactone 100 mg daily and Lasix 40 mg daily - LVP 25% 100g with the paracentesis now - cell count, albumin, total protein - Continue lactulose and Xifaxan - Follow-up with  [facility] as scheduled for liver transplant evaluation - Follow-up with me or Colleen in 6-8 weeks, earlier if needed - Abdominal ultrasound 1/23 for hepatocellular carcinoma screening - He was counseled to continue to abstain from all alcohol. He has completed his alcohol counseling.    Please see the "Patient Instructions" section for addition details about the plan.  HPI: Ian Hall is a 55 y.o. male who returns in follow-up. Last seen in the office 06/25/21. Interval history obtained through the patient and review of his electronic health record. He was hospitalized for symptomatic hyponatremia last month. Continues follow-up with the [facility] for transplant evaluation. He has completed the necessary alcohol counseling and has upcoming follow-up to clarify his transplant status.He remains very motivated to live and wants to get a transplant.  He reports no alcohol use since December 2021.   He presents today with ongoing trouble with ascites and lower extremity edema despite lasix 20 mg QD and spironolactone 100 mg daily. Last paracentesis was 05/12/21 and 3.4L of ascites were removed. He reports 100% adherence with sodium restricted diet and medications. The shortness of breath that he was reporting during his last clinic visit has improved. He has no ongoing abdominal pain. He has 3 BM daily.   Encephalopathy has been well controlled on lactulose and Xifaxan.  Overall doing "okay." Notes that he has severe fatigue.    Most recent labs from 08/25/21 show a sodium of 122, crt 0.71, hgb 9.7, platelets 54; Labs 08/23/21 TB 10.4, AST 173, ALT 49, alk phos 278  Labs AFP 5.5.03/31/21  Most recent abdominal imaging: - CT abd/pelvis with contrast 12/24/20: cirrhosis with perihepatic ascites and splenomegaly, patent portal  vein, calcified gallbladder wall versus large stone in the GB (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), diverticulosis, colonic wall thickening,  anasarca - Abdominal ultrasound 12/26/20: calcified gallbladder wall versus large gallstone - MRI 2/9/222: cirrhosis, splenomegaly, small volume ascites, small gastroesophageal and paraumbilical varices, mild porta hepatis adenopathy, cholelithiasis, colonic diverticulosis - Liver biopsy 01/09/21: Cirrhosis of uncertain etiology, marked cholestasis, no iron overload - suspected burnt out NASH or ASH, possible overlapping drug injury - Venogram 01/09/21: Portosystemic gradient of 42m Hg - Head CT 02/24/21 - no acute abnormality - Limited RUQ ultrasound 02/24/21: Limited evaluation. Echogenic liver. - CT Angio Chest for PE 04/10/21: Cirrhosis and splenomegaly, no evidence of PE, porcelain gallbladder - MRI 05/29/21: cirrhosis, no HCC, ascites, portal hypertension, splenomegaly   Prior endoscopic evaluation: - EGD 12/30/20: food in the stomach, no varices or portal hypertensive gastropathy - Colonoscopy 12/30/20: patchy colitis, diverticulosis      Past Medical History:  Diagnosis Date   Anxiety    Cirrhosis ([place]    COVID-19     Past Surgical History:  Procedure Laterality Date   ANKLE SURGERY Right    IR PARACENTESIS  01/07/2021   IR PARACENTESIS  01/30/2021   IR PARACENTESIS  05/12/2021   IR PARACENTESIS  05/27/2021   IR PARACENTESIS  08/11/2021   IR TRANSCATHETER BX  01/09/2021   IR [place] VASC ACCESS RIGHT  01/09/2021   IR VENOGRAM HEPATIC W HEMODYNAMIC EVALUATION  01/09/2021    Current Outpatient Medications  Medication Sig Dispense Refill   albuterol (VENTOLIN HFA) 108 (90 Base) MCG/ACT inhaler INHALE 2 PUFFS INTO THE LUNGS EVERY 6 HOURS AS NEEDED FOR WHEEZING OR SHORTNESS OF BREATH 6.7 g 3   bethanechol (URECHOLINE) 10 MG tablet Take 10 mg by mouth 3 (three) times daily.     dicyclomine (BENTYL) 10 MG capsule TAKE 1 CAPSULE BY MOUTH 4 TIMES A DAY BEFORE MEALS AND AT BEDTIME 120 capsule 1   fluticasone furoate-vilanterol (BREO ELLIPTA) 100-25 MCG/INH AEPB Inhale 1 puff into the lungs  daily. 28 each 6   folic acid (FOLVITE) 1 MG tablet TAKE 1 TABLET BY MOUTH EVERY DAY 30 tablet 1   furosemide (LASIX) 20 MG tablet Take 1 tablet (20 mg total) by mouth daily. 30 tablet 6   hydrOXYzine (ATARAX/VISTARIL) 10 MG tablet TAKE 1/2 TO 1 TABLET(5 TO 10 MG) BY MOUTH AT BEDTIME AS NEEDED FOR ITCHING 90 tablet 0   lactulose (CHRONULAC) 10 GM/15ML solution Take 10 g by mouth 3 (three) times daily as needed for mild constipation.     meclizine (ANTIVERT) 12.5 MG tablet TAKE 1 TABLET(12.5 MG) BY MOUTH DAILY AS NEEDED FOR DIZZINESS 30 tablet 2   pantoprazole (PROTONIX) 40 MG tablet Take 1 tablet (40 mg total) by mouth 2 (two) times daily. 60 tablet 0   sertraline (ZOLOFT) 50 MG tablet Take 50 mg by mouth daily.     spironolactone (ALDACTONE) 100 MG tablet TAKE 1 TABLET BY MOUTH EVERY DAY 90 tablet 2   XIFAXAN 550 MG TABS tablet TAKE 2 TABLETS BY MOUTH TWICE A DAY 120 tablet 1   No current facility-administered medications for this visit.    Allergies as of 09/25/2021 - Review Complete 09/25/2021  Allergen Reaction Noted   Hydrocodone Itching and Other (See Comments) 08/26/2015      Physical Exam: General:   Alert, in NAD. Scleral icterus Heart:  Regular rate and rhythm; no murmurs Pulm: Clear anteriorly; no wheezing Abdomen:  Protuberent but soft. Bruise over the  right flank at th sight of prior paracentesis. Nontender. Nondistended. Normal bowel sounds. No rebound or guarding. No fluid wave.  No hepatosplenomegaly. LAD: No inguinal or umbilical LAD Extremities:  1-2+ LE edema Neurologic:  Alert and  oriented x4;  grossly normal neurologically; no asterixis or clonus. Skin: No jaundice. Mild palmar erythema. Few spider angioma on the chest wall.  No Terry's nails. Psych:  Alert and cooperative. Normal mood and affect.     Kimberly L. Tarri Glenn, MD, MPH 09/25/2021, 2:58 PM

## 2021-09-25 NOTE — Patient Instructions (Addendum)
It was a pleasure to see you today.   I recommend that you continue to take your aldactone 100 mg daily. I would like to increase your Lasix to 40 mg daily.  Follow-up with [facility] as scheduled for liver transplant evaluation  We will arrange for a paracentesis to take off the fluid.   Continue to take your Xifaxan and lactulose. You may need to increase your lactulose if you are feeling confused of clouded in your thinking.   I would like for you to have labs today and in 7-14 days  You have been scheduled for an abdominal paracentesis at [facility]  radiology (1st floor of hospital) on 09/28/2021 at [place]. Please arrive at least 15 minutes prior to your appointment time for registration. Should you need to reschedule this appointment for any reason, please call our office at [phone].   Due to recent changes in healthcare laws, you may see the results of your imaging and laboratory studies on MyChart before your provider has had a chance to review them.  We understand that in some cases there may be results that are confusing or concerning to you. Not all laboratory results come back in the same time frame and the provider may be waiting for multiple results in order to interpret others.  Please give [place] 48 hours in order for your provider to thoroughly review all the results before contacting the office for clarification of your results.    Thank you for choosing [facility]  Ian Hall

## 2021-09-27 ENCOUNTER — Encounter: Payer: Self-pay | Admitting: Gastroenterology

## 2021-09-28 ENCOUNTER — Other Ambulatory Visit: Payer: Self-pay

## 2021-09-28 ENCOUNTER — Ambulatory Visit (HOSPITAL_COMMUNITY)
Admission: RE | Admit: 2021-09-28 | Discharge: 2021-09-28 | Disposition: A | Discharge status: Home / Self Care | Payer: No Typology Code available for payment source | Source: Ambulatory Visit | Attending: Gastroenterology | Admitting: Gastroenterology

## 2021-09-28 DIAGNOSIS — K7031 Alcoholic cirrhosis of liver with ascites: Secondary | ICD-10-CM

## 2021-09-28 DIAGNOSIS — K7011 Alcoholic hepatitis with ascites: Secondary | ICD-10-CM

## 2021-09-28 DIAGNOSIS — R609 Edema, unspecified: Secondary | ICD-10-CM

## 2021-09-28 DIAGNOSIS — E871 Hypo-osmolality and hyponatremia: Secondary | ICD-10-CM

## 2021-09-28 DIAGNOSIS — K766 Portal hypertension: Secondary | ICD-10-CM

## 2021-09-28 DIAGNOSIS — K7682 Hepatic encephalopathy: Secondary | ICD-10-CM

## 2021-09-28 HISTORY — PX: IR PARACENTESIS: IMG2679

## 2021-09-28 LAB — BODY FLUID CELL COUNT WITH DIFFERENTIAL
Eos, Fluid: 0 %
Lymphs, Fluid: 60 %
Monocyte-Macrophage-Serous Fluid: 25 % — ABNORMAL LOW (ref 50–90)
Neutrophil Count, Fluid: 15 % (ref 0–25)
Total Nucleated Cell Count, Fluid: 37 cu mm (ref 0–1000)

## 2021-09-28 LAB — ALBUMIN, PLEURAL OR PERITONEAL FLUID: Albumin, Fluid: <1.5 g/dL

## 2021-09-28 LAB — PROTEIN, PLEURAL OR PERITONEAL FLUID: Total protein, fluid: <3 g/dL

## 2021-09-28 MED ORDER — ALBUMIN HUMAN 25 % IV SOLN
INTRAVENOUS | Status: AC
  Administered 2021-09-28: 50 g
  Filled 2021-09-28: qty 200

## 2021-09-28 MED ORDER — LIDOCAINE HCL 1 % IJ SOLN
INTRAMUSCULAR | Status: AC
  Administered 2021-09-28: 10 mL
  Filled 2021-09-28: qty 20

## 2021-09-28 MED ORDER — ALBUMIN HUMAN 25 % IV SOLN
50.0000 g | Freq: Once | INTRAVENOUS | Status: DC
  Filled 2021-09-28: qty 200

## 2021-09-28 NOTE — Procedures (Signed)
PROCEDURE SUMMARY:  Successful US guided paracentesis from RLQ.  Yielded 8.6 L of hazy yellow fluid.  No immediate complications.  Pt tolerated well.   Specimen was sent for labs.  EBL < 21m  KAscencion DikePA-C 09/28/2021 10:11 AM

## 2021-09-30 LAB — PATHOLOGIST SMEAR REVIEW

## 2021-10-03 ENCOUNTER — Other Ambulatory Visit: Payer: Self-pay | Admitting: Physician Assistant

## 2021-10-07 ENCOUNTER — Other Ambulatory Visit: Payer: Self-pay | Admitting: Physician Assistant

## 2021-10-09 ENCOUNTER — Other Ambulatory Visit: Payer: Self-pay | Admitting: Physician Assistant

## 2021-10-12 NOTE — Progress Notes (Signed)
Established Patient Office Visit  Subjective:  Patient ID: Ian Hall, male    DOB: January 24, 1966  Age: 55 y.o. MRN: [id number]  CC:  Chief Complaint  Patient presents with   Follow-up    Patient states he is here for a 3 week follow up. Per patient for the last two weeks when its time to sleep his whole body has been itching    HPI [name] presents for follow up .  Last 3 weeks have been ok. No worsening jaundice or abdominal pain /distension. Continues to abstain from etoh - last drink was in late 2021.   Notes pruritus has started - universal, worst before bed. No rash. No changes to exercise, diet, or hygiene products. Has not tried any treatment as he wanted to ensure treatment would not affect his liver.  Otherwise he notes baseline abdominal pain has been frustrating - interested in options for pain management. Discussed limitations on this given his cirrhosis.   Due for eye exam - requesting referral.   Past Medical History:  Diagnosis Date   Anxiety    Cirrhosis ([place])    COVID-19     Past Surgical History:  Procedure Laterality Date   ANKLE SURGERY Right    IR PARACENTESIS  01/07/2021   IR PARACENTESIS  01/30/2021   IR PARACENTESIS  05/12/2021   IR PARACENTESIS  05/27/2021   IR PARACENTESIS  08/11/2021   IR PARACENTESIS  09/28/2021   IR TRANSCATHETER BX  01/09/2021   IR US GUIDE VASC ACCESS RIGHT  01/09/2021   IR VENOGRAM HEPATIC W HEMODYNAMIC EVALUATION  01/09/2021    Family History  Problem Relation Age of Onset   Diabetes Mother    Memory loss Mother    Diabetes Father    Diabetes Cousin    Colon cancer Neg Hx    Esophageal cancer Neg Hx     Social History   Socioeconomic History   Marital status: Married    Spouse name: Not on file   Number of children: 0   Years of education: Not on file   Highest education level: Not on file  Occupational History   Occupation: IT sales professional: [employer]  Tobacco Use   Smoking status:  Former    Packs/day: 0.50    Years: 23.00    Pack years: 11.50    Types: Cigarettes    Start date: 1981    Quit date: 12/16/2002    Years since quitting: 18.8   Smokeless tobacco: Never  Vaping Use   Vaping Use: Never used  Substance and Sexual Activity   Alcohol use: Not Currently    Comment: none for 3-4 years (stated 12/16/2020)   Drug use: No   Sexual activity: Yes    Birth control/protection: None  Other Topics Concern   Not on file  Social History Narrative   Not on file   Social Determinants of Health   Financial Resource Strain: Not on file  Food Insecurity: Not on file  Transportation Needs: Not on file  Physical Activity: Not on file  Stress: Not on file  Social Connections: Not on file  Intimate Partner Violence: Not on file    Outpatient Medications Prior to Visit  Medication Sig Dispense Refill   dicyclomine (BENTYL) 10 MG capsule Take 1 capsule (10 mg total) by mouth 4 (four) times daily -  before meals and at bedtime. 295 capsule 2   folic acid (FOLVITE) 1 MG tablet Take  1 tablet (1 mg total) by mouth daily. 30 tablet 1   furosemide (LASIX) 40 MG tablet Take 1 tablet (40 mg total) by mouth daily. 90 tablet 3   lactulose (CEPHULAC) 10 g packet Take 3 packets (30 g total) by mouth 3 (three) times daily. 90 each 4   psyllium (METAMUCIL SMOOTH TEXTURE) 28 % packet Take 1 packet by mouth 2 (two) times daily. (Patient not taking: Reported on 02/06/2021)     rifaximin (XIFAXAN) 550 MG TABS tablet Take 1 tablet (550 mg total) by mouth 2 (two) times daily. 60 tablet 11   spironolactone (ALDACTONE) 100 MG tablet Take 1 tablet (100 mg total) by mouth daily. 90 tablet 0   thiamine 100 MG tablet Take 1 tablet (100 mg total) by mouth daily. (Patient not taking: No sig reported) 30 tablet 1   Facility-Administered Medications Prior to Visit  Medication Dose Route Frequency Provider Last Rate Last Admin   albumin human 5 % solution 75 g  75 g Intravenous Once [place], Amy  S, PA-C        Allergies  Allergen Reactions   Hydrocodone Itching and Other (See Comments)    Noted with hydrocodone cough syrup. Itching only, no hives.  02/03/16: denied itching with recent hydrocodone cough syrup. Only one episode of itching previously.     ROS Review of Systems  Constitutional: Negative.   HENT: Negative.    Eyes: Negative.   Respiratory: Negative.    Cardiovascular: Negative.   Gastrointestinal:  Positive for abdominal distention and abdominal pain. Negative for anal bleeding, blood in stool, constipation, diarrhea, nausea, rectal pain and vomiting.       Stable  Genitourinary: Negative.   Musculoskeletal: Negative.   Skin:  Positive for color change (stable). Negative for pallor, rash and wound.  Neurological: Negative.   Psychiatric/Behavioral: Negative.    All other systems reviewed and are negative.    Objective:    Physical Exam Constitutional:      General: He is not in acute distress.    Appearance: Normal appearance. He is normal weight. He is not ill-appearing, toxic-appearing or diaphoretic.  Cardiovascular:     Rate and Rhythm: Normal rate and regular rhythm.     Heart sounds: Normal heart sounds. No murmur heard.   No friction rub. No gallop.  Pulmonary:     Effort: Pulmonary effort is normal. No respiratory distress.     Breath sounds: Normal breath sounds. No stridor. No wheezing, rhonchi or rales.  Chest:     Chest wall: No tenderness.  Abdominal:     General: Bowel sounds are normal. There is distension.     Palpations: Abdomen is soft. There is no mass.     Tenderness: There is no abdominal tenderness. There is no right CVA tenderness, left CVA tenderness, guarding or rebound.     Hernia: No hernia is present.  Skin:    General: Skin is warm and dry.     Coloration: Skin is jaundiced. Skin is not pale.     Findings: No bruising, erythema, lesion or rash.  Neurological:     General: No focal deficit present.     Mental Status:  He is alert and oriented to person, place, and time. Mental status is at baseline.  Psychiatric:        Mood and Affect: Mood normal.        Behavior: Behavior normal.        Thought Content: Thought content normal.  Judgment: Judgment normal.    BP 118/73   Pulse 84   Temp 98 F (36.7 C) (Temporal)   Resp 18   Ht 5' 7" (1.702 m)   Wt 219 lb 12.8 oz (99.7 kg)   SpO2 98%   BMI 34.43 kg/m  Wt Readings from Last 3 Encounters:  09/25/21 234 lb 6 oz (106.3 kg)  09/03/21 214 lb 9.6 oz (97.3 kg)  08/24/21 225 lb 5 oz (102.2 kg)     Health Maintenance Due  Topic Date Due   COVID-19 Vaccine (3 - Booster for Pfizer series) 04/25/2020    There are no preventive care reminders to display for this patient.  Lab Results  Component Value Date   TSH 2.534 04/23/2021   Lab Results  Component Value Date   WBC 4.4 08/25/2021   HGB 9.7 (L) 08/25/2021   HCT 26.8 (L) 08/25/2021   MCV 94.4 08/25/2021   PLT 54 (L) 08/25/2021   Lab Results  Component Value Date   NA 124 (L) 09/25/2021   K 4.5 09/25/2021   CO2 22 09/25/2021   GLUCOSE 133 (H) 09/25/2021   BUN 20 09/25/2021   CREATININE 0.94 09/25/2021   BILITOT 10.8 (H) 09/25/2021   ALKPHOS 314 (H) 09/25/2021   AST 146 (H) 09/25/2021   ALT 38 09/25/2021   PROT 5.4 (L) 09/25/2021   ALBUMIN 3.0 (L) 09/25/2021   CALCIUM 8.2 (L) 09/25/2021   ANIONGAP 6 08/25/2021   EGFR 104 02/04/2021   GFR 91.60 09/25/2021   Lab Results  Component Value Date   CHOL 132 01/14/2021   Lab Results  Component Value Date   HDL 12 (L) 01/14/2021   Lab Results  Component Value Date   LDLCALC 96 01/14/2021   Lab Results  Component Value Date   TRIG 96 02/25/2021   Lab Results  Component Value Date   CHOLHDL 11.0 (H) 01/14/2021   Lab Results  Component Value Date   HGBA1C 4.3 (L) 02/04/2021      Assessment & Plan:   Problem List Items Addressed This Visit   None Visit Diagnoses     Anemia of chronic disease    -  Primary    Relevant Orders   Magnesium (Completed)   Comprehensive metabolic panel (Completed)   Hemoglobin A1c (Completed)   CBC (Completed)   Protime-INR (Completed)   Steatosis of liver       Relevant Orders   Magnesium (Completed)   Comprehensive metabolic panel (Completed)   Hemoglobin A1c (Completed)   CBC (Completed)   Ambulatory referral to Ophthalmology   Ambulatory referral to Pain Clinic   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[place])       Relevant Orders   Magnesium (Completed)   Comprehensive metabolic panel (Completed)   Hemoglobin A1c (Completed)   CBC (Completed)   Ambulatory referral to Ophthalmology   Ambulatory referral to Pain Clinic   Muscle cramping       Relevant Orders   Magnesium (Completed)   Chronic pain syndrome       Relevant Orders   Ambulatory referral to Ophthalmology   Ambulatory referral to Pain Clinic   Cholestatic pruritus           Meds ordered this encounter  Medications   DISCONTD: hydrOXYzine (ATARAX/VISTARIL) 10 MG tablet    Sig: Take 0.5-1 tablets (5-10 mg total) by mouth at bedtime as needed for itching.    Dispense:  30 tablet    Refill:  0    Order  Specific Question:   Supervising Provider    Answer:   Carlota Raspberry, JEFFREY R [[id number]]    Follow-up: No follow-ups on file.   PLAN Labs collected. Will follow up with the patient as warranted. Low dose hydroxyzine for itching. Discussed avoiding analgesics until consult with pain management Patient encouraged to call clinic with any questions, comments, or concerns.  Maximiano Coss, NP

## 2021-10-13 ENCOUNTER — Ambulatory Visit: Payer: No Typology Code available for payment source | Admitting: Gastroenterology

## 2021-10-19 ENCOUNTER — Other Ambulatory Visit: Payer: Self-pay | Admitting: Gastroenterology

## 2021-10-23 ENCOUNTER — Other Ambulatory Visit: Payer: Self-pay | Admitting: Physician Assistant

## 2021-11-06 ENCOUNTER — Ambulatory Visit: Payer: No Typology Code available for payment source | Admitting: Gastroenterology

## 2021-11-18 ENCOUNTER — Ambulatory Visit (INDEPENDENT_AMBULATORY_CARE_PROVIDER_SITE_OTHER): Payer: No Typology Code available for payment source | Admitting: Registered Nurse

## 2021-11-18 ENCOUNTER — Encounter: Payer: Self-pay | Admitting: Registered Nurse

## 2021-11-18 VITALS — BP 143/88 | HR 93 | Temp 98.4°F | Resp 18 | Ht 67.0 in | Wt 190.3 lb

## 2021-11-18 DIAGNOSIS — M25571 Pain in right ankle and joints of right foot: Secondary | ICD-10-CM | POA: Diagnosis not present

## 2021-11-18 DIAGNOSIS — G8929 Other chronic pain: Secondary | ICD-10-CM | POA: Diagnosis not present

## 2021-11-18 MED ORDER — TRAMADOL HCL 50 MG PO TABS
25.0000 mg | ORAL_TABLET | Freq: Three times a day (TID) | ORAL | 0 refills | Status: DC | PRN
Start: 2021-11-18 — End: 2021-12-23

## 2021-11-18 NOTE — Progress Notes (Signed)
Established Patient Office Visit  Subjective:  Patient ID: Ian Hall, male    DOB: 12/14/1965  Age: 55 y.o. MRN: [id number]  CC:  Chief Complaint  Patient presents with   Ankle Pain    Patient states he is here because he is having some right ankle pain and hip pain. Patient st getting back to walking after his transplant he needs a pain medication because tylenol is not working.    HPI Ian Hall presents for ankle pain  R side Chronic Worse when walking. Has been more active since liver transplant  Aching pain Hx of R ankle injury and surgery Walks with cane at baseline  Since liver transplant, no NSAIDs - can only use tylenol. Not helping.  Overall doing well since transplant. Continue to follow with the [facility] transplant team  Past Medical History:  Diagnosis Date   Anxiety    Cirrhosis ([place])    COVID-19     Past Surgical History:  Procedure Laterality Date   ANKLE SURGERY Right    IR PARACENTESIS  01/07/2021   IR PARACENTESIS  01/30/2021   IR PARACENTESIS  05/12/2021   IR PARACENTESIS  05/27/2021   IR PARACENTESIS  08/11/2021   IR PARACENTESIS  09/28/2021   IR TRANSCATHETER BX  01/09/2021   IR US GUIDE VASC ACCESS RIGHT  01/09/2021   IR VENOGRAM HEPATIC W HEMODYNAMIC EVALUATION  01/09/2021    Family History  Problem Relation Age of Onset   Diabetes Mother    Memory loss Mother    Diabetes Father    Diabetes Cousin    Colon cancer Neg Hx    Esophageal cancer Neg Hx     Social History   Socioeconomic History   Marital status: Married    Spouse name: Not on file   Number of children: 0   Years of education: Not on file   Highest education level: Not on file  Occupational History   Occupation: IT sales professional: [employer]  Tobacco Use   Smoking status: Former    Packs/day: 0.50    Years: 23.00    Pack years: 11.50    Types: Cigarettes    Start date: 1981    Quit date: 12/16/2002    Years since quitting: 18.9   Smokeless  tobacco: Never  Vaping Use   Vaping Use: Never used  Substance and Sexual Activity   Alcohol use: Not Currently    Comment: none for 3-4 years (stated 12/16/2020)   Drug use: No   Sexual activity: Yes    Birth control/protection: None  Other Topics Concern   Not on file  Social History Narrative   Not on file   Social Determinants of Health   Financial Resource Strain: Not on file  Food Insecurity: Not on file  Transportation Needs: Not on file  Physical Activity: Not on file  Stress: Not on file  Social Connections: Not on file  Intimate Partner Violence: Not on file    Outpatient Medications Prior to Visit  Medication Sig Dispense Refill   albuterol (VENTOLIN HFA) 108 (90 Base) MCG/ACT inhaler INHALE 2 PUFFS INTO THE LUNGS EVERY 6 HOURS AS NEEDED FOR WHEEZING OR SHORTNESS OF BREATH 6.7 g 3   bethanechol (URECHOLINE) 10 MG tablet Take 10 mg by mouth 3 (three) times daily.     dicyclomine (BENTYL) 10 MG capsule TAKE 1 CAPSULE BY MOUTH 4 TIMES A DAY BEFORE MEALS AND AT BEDTIME 360 capsule 1  fluticasone furoate-vilanterol (BREO ELLIPTA) 100-25 MCG/INH AEPB Inhale 1 puff into the lungs daily. 28 each 6   folic acid (FOLVITE) 1 MG tablet TAKE 1 TABLET BY MOUTH EVERY DAY 90 tablet 1   furosemide (LASIX) 20 MG tablet Take 1 tablet (20 mg total) by mouth daily. 30 tablet 6   hydrOXYzine (ATARAX/VISTARIL) 10 MG tablet TAKE 1/2 TO 1 TABLET(5 TO 10 MG) BY MOUTH AT BEDTIME AS NEEDED FOR ITCHING 90 tablet 0   lactulose (CHRONULAC) 10 GM/15ML solution Take 10 g by mouth 3 (three) times daily as needed for mild constipation.     meclizine (ANTIVERT) 12.5 MG tablet TAKE 1 TABLET(12.5 MG) BY MOUTH DAILY AS NEEDED FOR DIZZINESS 30 tablet 2   pantoprazole (PROTONIX) 40 MG tablet Take 1 tablet (40 mg total) by mouth 2 (two) times daily. 60 tablet 0   sertraline (ZOLOFT) 50 MG tablet Take 50 mg by mouth daily.     spironolactone (ALDACTONE) 100 MG tablet TAKE 1 TABLET BY MOUTH EVERY DAY 90 tablet  2   XIFAXAN 550 MG TABS tablet TAKE 2 TABLETS BY MOUTH TWICE A DAY 120 tablet 1   Facility-Administered Medications Prior to Visit  Medication Dose Route Frequency Provider Last Rate Last Admin   albumin human 25 % solution 100 g  100 g Intravenous Once [place], MD        Allergies  Allergen Reactions   Hydrocodone Itching and Other (See Comments)    Noted with hydrocodone cough syrup. Itching only, no hives.  02/03/16: denied itching with recent hydrocodone cough syrup. Only one episode of itching previously.     ROS Review of Systems  Constitutional: Negative.   HENT: Negative.    Eyes: Negative.   Respiratory: Negative.    Cardiovascular: Negative.   Gastrointestinal: Negative.   Genitourinary: Negative.   Musculoskeletal:  Positive for arthralgias. Negative for back pain, gait problem, joint swelling, myalgias, neck pain and neck stiffness.  Skin: Negative.   Neurological: Negative.   Psychiatric/Behavioral: Negative.    All other systems reviewed and are negative.    Objective:    Physical Exam Constitutional:      General: He is not in acute distress.    Appearance: Normal appearance. He is normal weight. He is not ill-appearing, toxic-appearing or diaphoretic.  Cardiovascular:     Rate and Rhythm: Normal rate and regular rhythm.     Heart sounds: Normal heart sounds. No murmur heard.   No friction rub. No gallop.  Pulmonary:     Effort: Pulmonary effort is normal. No respiratory distress.     Breath sounds: Normal breath sounds. No stridor. No wheezing, rhonchi or rales.  Chest:     Chest wall: No tenderness.  Musculoskeletal:        General: No swelling, tenderness, deformity or signs of injury. Normal range of motion.     Right lower leg: No edema.     Left lower leg: No edema.  Skin:    Coloration: Skin is not jaundiced.  Neurological:     General: No focal deficit present.     Mental Status: He is alert and oriented to person, place, and time.  Mental status is at baseline.  Psychiatric:        Mood and Affect: Mood normal.        Behavior: Behavior normal.        Thought Content: Thought content normal.        Judgment: Judgment normal.  BP (!) 143/88    Pulse 93    Temp 98.4 F (36.9 C) (Temporal)    Resp 18    Ht 5' 7" (1.702 m)    Wt 190 lb 4.8 oz (86.3 kg)    SpO2 99%    BMI 29.81 kg/m  Wt Readings from Last 3 Encounters:  11/18/21 190 lb 4.8 oz (86.3 kg)  09/25/21 234 lb 6 oz (106.3 kg)  09/03/21 214 lb 9.6 oz (97.3 kg)     Health Maintenance Due  Topic Date Due   TETANUS/TDAP  Never done   COVID-19 Vaccine (3 - Pfizer risk series) 03/28/2020    There are no preventive care reminders to display for this patient.  Lab Results  Component Value Date   TSH 2.534 04/23/2021   Lab Results  Component Value Date   WBC 4.4 08/25/2021   HGB 9.7 (L) 08/25/2021   HCT 26.8 (L) 08/25/2021   MCV 94.4 08/25/2021   PLT 54 (L) 08/25/2021   Lab Results  Component Value Date   NA 124 (L) 09/25/2021   K 4.5 09/25/2021   CO2 22 09/25/2021   GLUCOSE 133 (H) 09/25/2021   BUN 20 09/25/2021   CREATININE 0.94 09/25/2021   BILITOT 10.8 (H) 09/25/2021   ALKPHOS 314 (H) 09/25/2021   AST 146 (H) 09/25/2021   ALT 38 09/25/2021   PROT 5.4 (L) 09/25/2021   ALBUMIN 3.0 (L) 09/25/2021   CALCIUM 8.2 (L) 09/25/2021   ANIONGAP 6 08/25/2021   EGFR 104 02/04/2021   GFR 91.60 09/25/2021   Lab Results  Component Value Date   CHOL 132 01/14/2021   Lab Results  Component Value Date   HDL 12 (L) 01/14/2021   Lab Results  Component Value Date   LDLCALC 96 01/14/2021   Lab Results  Component Value Date   TRIG 96 02/25/2021   Lab Results  Component Value Date   CHOLHDL 11.0 (H) 01/14/2021   Lab Results  Component Value Date   HGBA1C 4.3 (L) 02/04/2021      Assessment & Plan:   Problem List Items Addressed This Visit   None Visit Diagnoses     Chronic pain of right ankle    -  Primary   Relevant Medications    traMADol (ULTRAM) 50 MG tablet       Meds ordered this encounter  Medications   traMADol (ULTRAM) 50 MG tablet    Sig: Take 0.5-1 tablets (25-50 mg total) by mouth every 8 (eight) hours as needed.    Dispense:  60 tablet    Refill:  0    Order Specific Question:   Supervising Provider    Answer:   Carlota Raspberry, JEFFREY R [[id number]]    Follow-up: Return if symptoms worsen or fail to improve.   PLAN RICE method, stretching, and other conservative nonpharm measures encouraged. Will give tramadol for intermittent use for breakthrough pain If worsening or failing to improve, can consider PT or ortho Patient encouraged to call clinic with any questions, comments, or concerns.  Maximiano Coss, NP

## 2021-11-18 NOTE — Patient Instructions (Addendum)
[name] -   I am THRILLED for you! I'm glad the transplant is done.  Keep taking tylenol as needed. Ok to use tramadol intermittently. Use with caution as it might make you sleepy.  Try to stretch for 20-30 minutes a day.  Call if the pain does not get better or if it gets any worse  Thank you  Ian Hall     If you have lab work done today you will be contacted with your lab results within the next 2 weeks.  If you have not heard from [place] then please contact us. The fastest way to get your results is to register for My Chart.   IF you received an x-ray today, you will receive an invoice from [facility]. Please contact [facility] at [phone] with questions or concerns regarding your invoice.   IF you received labwork today, you will receive an invoice from [facility]. Please contact [facility] at [phone] with questions or concerns regarding your invoice.   Our billing staff will not be able to assist you with questions regarding bills from these companies.  You will be contacted with the lab results as soon as they are available. The fastest way to get your results is to activate your My Chart account. Instructions are located on the last page of this paperwork. If you have not heard from [place] regarding the results in 2 weeks, please contact this office.

## 2021-12-04 ENCOUNTER — Ambulatory Visit (HOSPITAL_COMMUNITY): Payer: No Typology Code available for payment source

## 2021-12-18 NOTE — Progress Notes (Signed)
Established Patient Office Visit  Subjective:  Patient ID: Ian Hall, male    DOB: Jun 21, 1966  Age: 56 y.o. MRN: [id number]  CC:  Chief Complaint  Patient presents with   Hospitalization Follow-up    Patient states he is here for a hospital follow up. Per patient he was hospital for his liver for 8 in a two weeks time frame.    HPI Ian Hall presents for HFU  Has been admitted a number of times in preceding weeks for acute liver failure Initially noted by Dr Linna Darner in late 2021 when patient presented with acute hematuria and dumping syndrome.  Noted to have thrombocytopenia.  Most recent admission on 01/06/21 with hyperbilirubinemia, abdominal ascities, jaundice, acute on chronic liver failure.  Has been doing well since. Stable. Jaundice improved. No GI symptoms of note beyond some residual bloating and pain  Ongoing fatigue. Tough to exercise / walk.  He does express concern about returning to work. We discussed that he will likely have months or a year or two before he can return safely , if ever, pending transplant consult, safety, etc. He would be a good candidate for disability.    Past Medical History:  Diagnosis Date   Anxiety    Cirrhosis ([place])    COVID-19     Past Surgical History:  Procedure Laterality Date   ANKLE SURGERY Right    IR PARACENTESIS  01/07/2021   IR PARACENTESIS  01/30/2021   IR PARACENTESIS  05/12/2021   IR PARACENTESIS  05/27/2021   IR PARACENTESIS  08/11/2021   IR PARACENTESIS  09/28/2021   IR TRANSCATHETER BX  01/09/2021   IR US GUIDE VASC ACCESS RIGHT  01/09/2021   IR VENOGRAM HEPATIC W HEMODYNAMIC EVALUATION  01/09/2021    Family History  Problem Relation Age of Onset   Diabetes Mother    Memory loss Mother    Diabetes Father    Diabetes Cousin    Colon cancer Neg Hx    Esophageal cancer Neg Hx     Social History   Socioeconomic History   Marital status: Married    Spouse name: Not on file   Number of children: 0   Years of  education: Not on file   Highest education level: Not on file  Occupational History   Occupation: IT sales professional: [employer]  Tobacco Use   Smoking status: Former    Packs/day: 0.50    Years: 23.00    Pack years: 11.50    Types: Cigarettes    Start date: 1981    Quit date: 12/16/2002    Years since quitting: 19.0   Smokeless tobacco: Never  Vaping Use   Vaping Use: Never used  Substance and Sexual Activity   Alcohol use: Not Currently    Comment: none for 3-4 years (stated 12/16/2020)   Drug use: No   Sexual activity: Yes    Birth control/protection: None  Other Topics Concern   Not on file  Social History Narrative   Not on file   Social Determinants of Health   Financial Resource Strain: Not on file  Food Insecurity: Not on file  Transportation Needs: Not on file  Physical Activity: Not on file  Stress: Not on file  Social Connections: Not on file  Intimate Partner Violence: Not on file    Outpatient Medications Prior to Visit  Medication Sig Dispense Refill   dicyclomine (BENTYL) 10 MG capsule Take 1 capsule (10 mg total)  by mouth 4 (four) times daily -  before meals and at bedtime. 335 capsule 2   folic acid (FOLVITE) 1 MG tablet Take 1 tablet (1 mg total) by mouth daily. 30 tablet 1   furosemide (LASIX) 40 MG tablet Take 1 tablet (40 mg total) by mouth daily. 90 tablet 0   psyllium (METAMUCIL SMOOTH TEXTURE) 28 % packet Take 1 packet by mouth 2 (two) times daily. (Patient not taking: Reported on 02/06/2021)     spironolactone (ALDACTONE) 100 MG tablet Take 1 tablet (100 mg total) by mouth daily. 90 tablet 0   thiamine 100 MG tablet Take 1 tablet (100 mg total) by mouth daily. (Patient not taking: No sig reported) 30 tablet 1   No facility-administered medications prior to visit.    Allergies  Allergen Reactions   Hydrocodone Itching and Other (See Comments)    Noted with hydrocodone cough syrup. Itching only, no hives.  02/03/16: denied  itching with recent hydrocodone cough syrup. Only one episode of itching previously.     ROS Review of Systems  Constitutional: Negative.   HENT: Negative.    Eyes: Negative.   Respiratory: Negative.    Cardiovascular: Negative.   Gastrointestinal:  Positive for abdominal distention and abdominal pain. Negative for anal bleeding, blood in stool, constipation, diarrhea, nausea, rectal pain and vomiting.  Genitourinary: Negative.   Musculoskeletal: Negative.   Skin: Negative.   Neurological: Negative.   Psychiatric/Behavioral: Negative.    All other systems reviewed and are negative.    Objective:    Physical Exam Constitutional:      General: He is not in acute distress.    Appearance: Normal appearance. He is normal weight. He is not ill-appearing, toxic-appearing or diaphoretic.  Cardiovascular:     Rate and Rhythm: Normal rate and regular rhythm.     Heart sounds: Normal heart sounds. No murmur heard.   No friction rub. No gallop.  Pulmonary:     Effort: Pulmonary effort is normal. No respiratory distress.     Breath sounds: Normal breath sounds. No stridor. No wheezing, rhonchi or rales.  Chest:     Chest wall: No tenderness.  Abdominal:     General: Bowel sounds are normal. There is distension.     Palpations: Abdomen is soft. There is no mass.     Tenderness: There is no abdominal tenderness. There is no right CVA tenderness, left CVA tenderness, guarding or rebound.     Hernia: No hernia is present.  Neurological:     General: No focal deficit present.     Mental Status: He is alert and oriented to person, place, and time. Mental status is at baseline.  Psychiatric:        Mood and Affect: Mood normal.        Behavior: Behavior normal.        Thought Content: Thought content normal.        Judgment: Judgment normal.    BP 132/72    Pulse 89    Temp 98 F (36.7 C) (Temporal)    Resp 18    Ht 5' 7"  (1.702 m)    Wt 221 lb 3.2 oz (100.3 kg)    SpO2 96%    BMI 34.64  kg/m  Wt Readings from Last 3 Encounters:  11/18/21 190 lb 4.8 oz (86.3 kg)  09/25/21 234 lb 6 oz (106.3 kg)  09/03/21 214 lb 9.6 oz (97.3 kg)     Health Maintenance Due  Topic Date  Due   TETANUS/TDAP  Never done   COVID-19 Vaccine (3 - Pfizer risk series) 03/28/2020    There are no preventive care reminders to display for this patient.  Lab Results  Component Value Date   TSH 2.534 04/23/2021   Lab Results  Component Value Date   WBC 4.4 08/25/2021   HGB 9.7 (L) 08/25/2021   HCT 26.8 (L) 08/25/2021   MCV 94.4 08/25/2021   PLT 54 (L) 08/25/2021   Lab Results  Component Value Date   NA 124 (L) 09/25/2021   K 4.5 09/25/2021   CO2 22 09/25/2021   GLUCOSE 133 (H) 09/25/2021   BUN 20 09/25/2021   CREATININE 0.94 09/25/2021   BILITOT 10.8 (H) 09/25/2021   ALKPHOS 314 (H) 09/25/2021   AST 146 (H) 09/25/2021   ALT 38 09/25/2021   PROT 5.4 (L) 09/25/2021   ALBUMIN 3.0 (L) 09/25/2021   CALCIUM 8.2 (L) 09/25/2021   ANIONGAP 6 08/25/2021   EGFR 104 02/04/2021   GFR 91.60 09/25/2021   Lab Results  Component Value Date   CHOL 132 01/14/2021   Lab Results  Component Value Date   HDL 12 (L) 01/14/2021   Lab Results  Component Value Date   LDLCALC 96 01/14/2021   Lab Results  Component Value Date   TRIG 96 02/25/2021   Lab Results  Component Value Date   CHOLHDL 11.0 (H) 01/14/2021   Lab Results  Component Value Date   HGBA1C 4.3 (L) 02/04/2021      Assessment & Plan:   Problem List Items Addressed This Visit       Digestive   Cirrhosis of liver with ascites ([place])   Other Visit Diagnoses     Anemia of chronic disease    -  Primary   Relevant Orders   Vitamin B12 (Completed)   Vitamin D, 25-hydroxy (Completed)   Platelet count (Completed)   Protime-INR (Completed)   Iron, TIBC and Ferritin Panel (Completed)   Ambulatory referral to Hematology   Ambulatory referral to Physical Therapy   Steatosis of liver       Relevant Orders    Comprehensive metabolic panel (Completed)   Lipid panel (Completed)   Ambulatory referral to Hematology   Ambulatory referral to Physical Therapy   Positive ANA (antinuclear antibody)       Relevant Orders   ANA w/Reflex (Completed)   Bacteria in urine       Relevant Orders   Urinalysis (Completed)   Urine Culture (Completed)       No orders of the defined types were placed in this encounter.   Follow-up: No follow-ups on file.   PLAN Labs as above. Discussed safe exercise and liver friendly diet Return in 2-3 mo for recheck Follow up with GI as scheduled Continue to avoid any alcohol Patient encouraged to call clinic with any questions, comments, or concerns.  Maximiano Coss, NP

## 2021-12-22 ENCOUNTER — Other Ambulatory Visit: Payer: Self-pay | Admitting: Registered Nurse

## 2021-12-22 DIAGNOSIS — M25571 Pain in right ankle and joints of right foot: Secondary | ICD-10-CM

## 2022-01-24 IMAGING — US IR PARACENTESIS
1 series · 10 of 10 positions shown · non-contrast
Comparison: none

INDICATION: History of cirrhosis with recurrent ascites. Request for diagnostic
and therapeutic paracentesis.

[Series 1: ir (id) (id)/(id)/(id) ir · 10 of 10 slices shown]
[im 1/10]
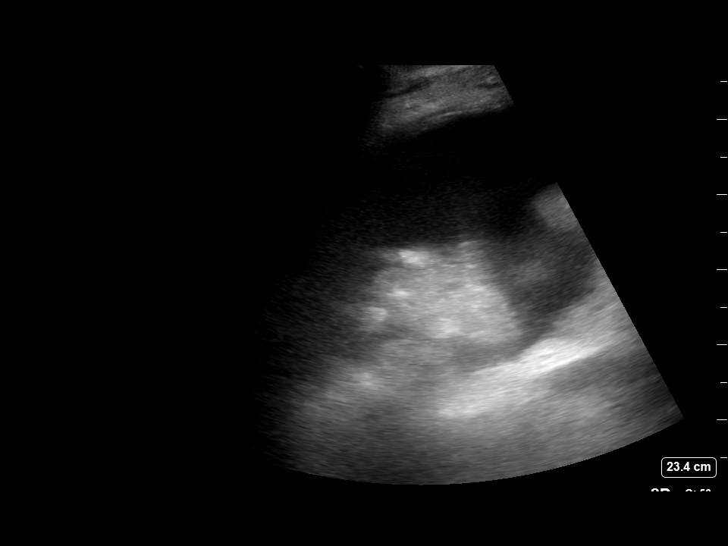
[im 2/10]
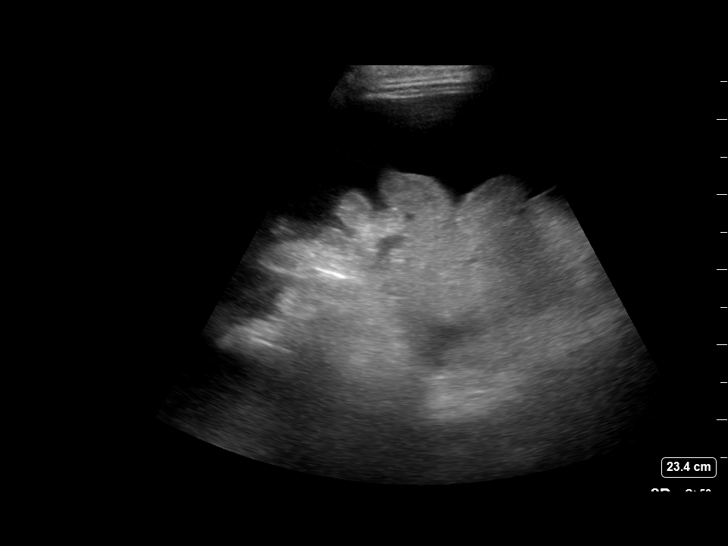
[im 3/10]
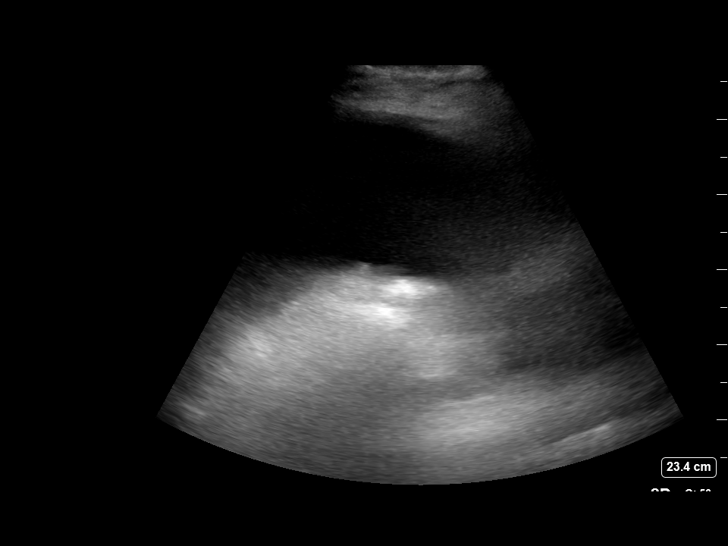
[im 4/10]
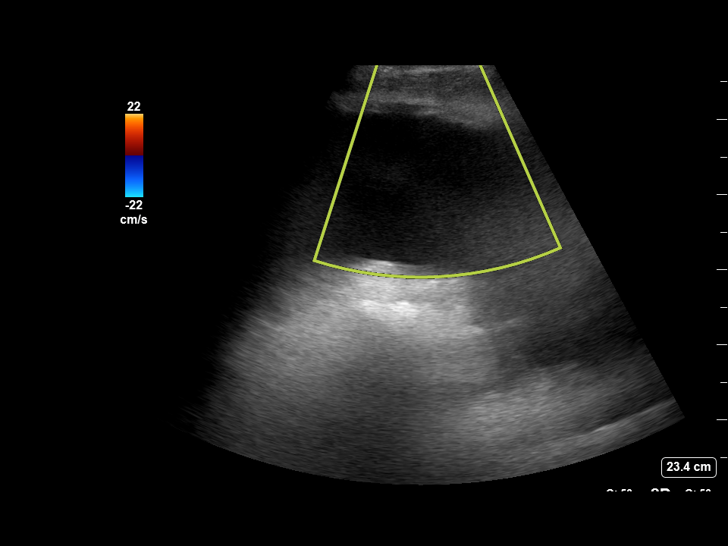
[im 5/10]
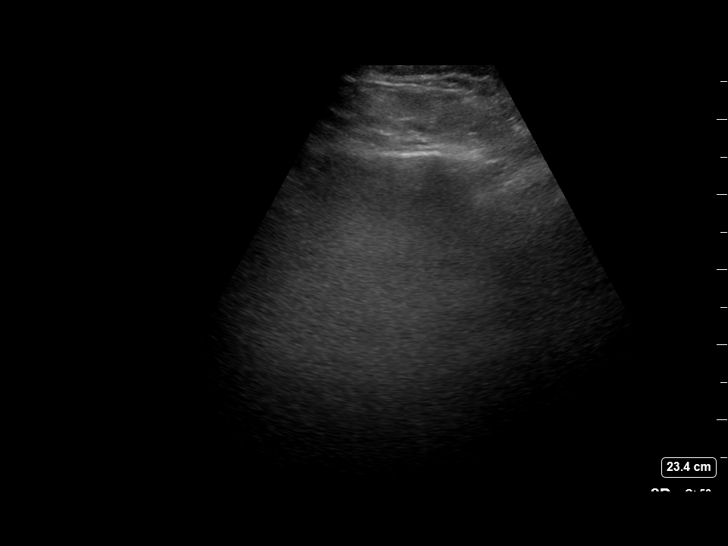
[im 6/10]
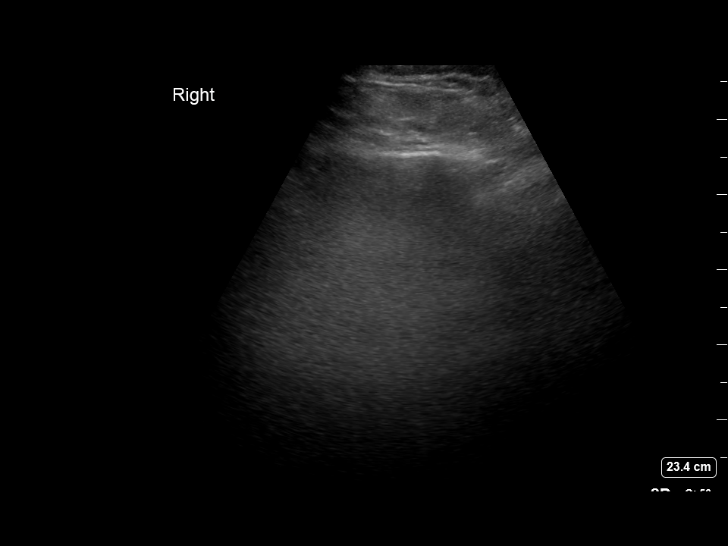
[im 7/10]
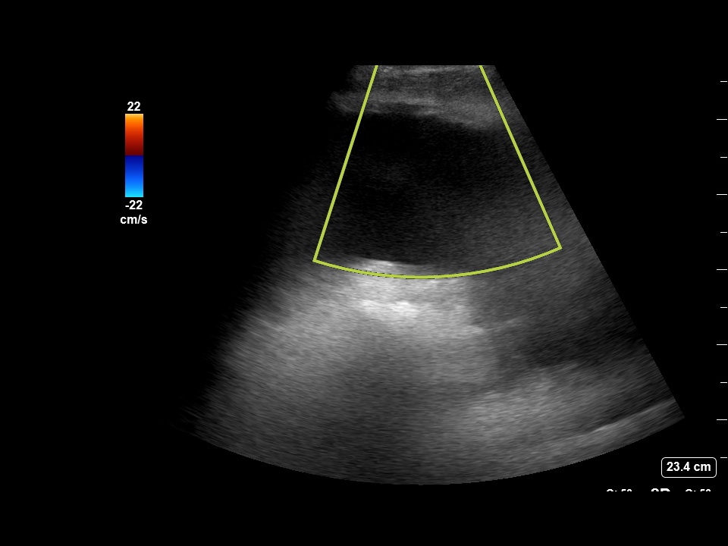
[im 8/10]
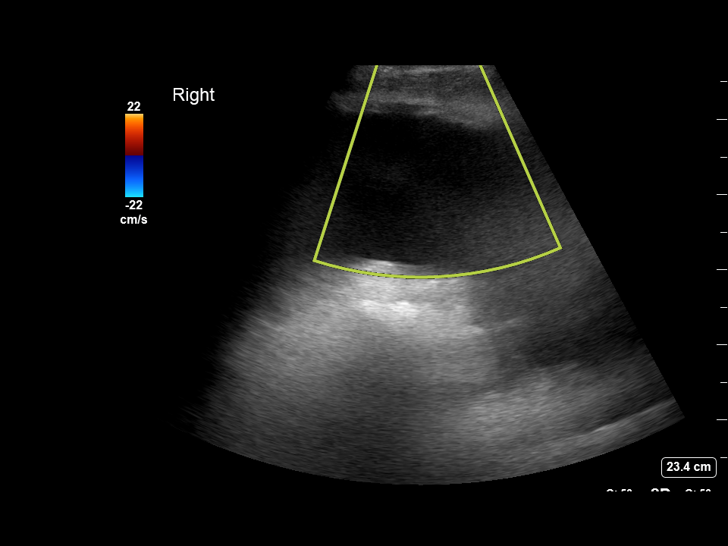
[im 9/10]
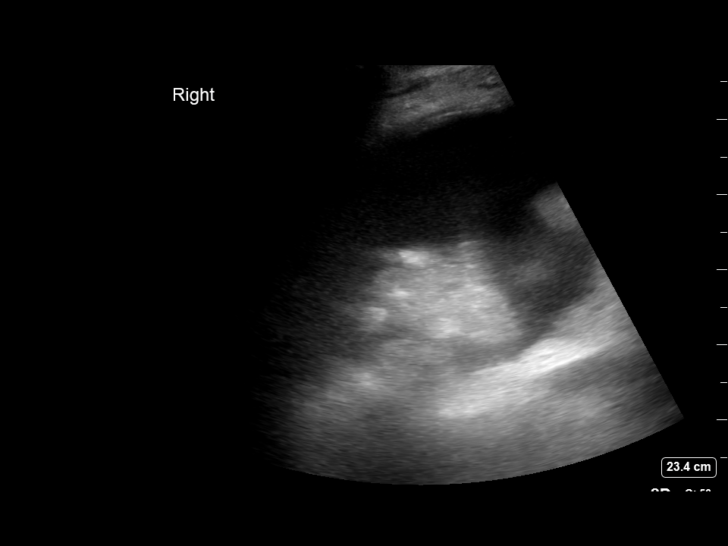
[im 10/10]
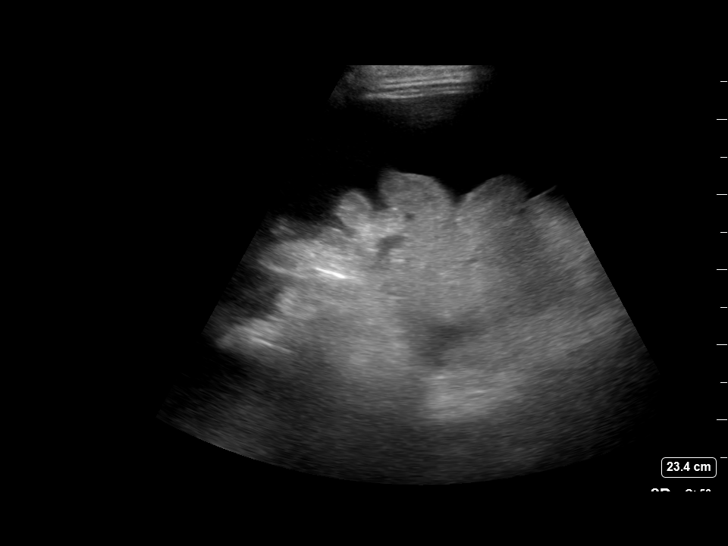

[10 of 10 positions shown; findings below may reference images not displayed]

EXAM:
ULTRASOUND GUIDED RIGHT LOWER QUADRANT PARACENTESIS

MEDICATIONS:
1% plain lidocaine, 5 mL

COMPLICATIONS:
None immediate.

PROCEDURE:
Informed written consent was obtained from the patient after a
discussion of the risks, benefits and alternatives to treatment. A
timeout was performed prior to the initiation of the procedure.

Initial ultrasound scanning demonstrates a large amount of ascites
within the right lower abdominal quadrant. The right lower abdomen
was prepped and draped in the usual sterile fashion. 1% lidocaine
was used for local anesthesia.

Following this, a 19 gauge, 10-cm, Yueh catheter was introduced. An
ultrasound image was saved for documentation purposes. The
paracentesis was performed. The catheter was removed and a dressing
was applied. The patient tolerated the procedure well without
immediate post procedural complication.
Patient received post-procedure intravenous albumin; see nursing
notes for details.
FINDINGS: A total of approximately 8.6 L of hazy yellow fluid was removed.
Samples were sent to the laboratory as requested by the clinical
team.
IMPRESSION: Successful ultrasound-guided paracentesis yielding 8.6 liters of
peritoneal fluid.

## 2022-06-25 ENCOUNTER — Ambulatory Visit: Payer: No Typology Code available for payment source | Admitting: Family Medicine

## 2022-09-21 ENCOUNTER — Encounter (HOSPITAL_COMMUNITY): Payer: Self-pay

## 2022-09-21 ENCOUNTER — Emergency Department (HOSPITAL_COMMUNITY)
Admission: EM | Admit: 2022-09-21 | Discharge: 2022-09-21 | Disposition: A | Discharge status: Home / Self Care | Payer: No Typology Code available for payment source | Attending: Emergency Medicine | Admitting: Emergency Medicine

## 2022-09-21 ENCOUNTER — Other Ambulatory Visit: Payer: Self-pay

## 2022-09-21 DIAGNOSIS — R944 Abnormal results of kidney function studies: Secondary | ICD-10-CM | POA: Diagnosis not present

## 2022-09-21 DIAGNOSIS — J449 Chronic obstructive pulmonary disease, unspecified: Secondary | ICD-10-CM | POA: Diagnosis not present

## 2022-09-21 DIAGNOSIS — Z7982 Long term (current) use of aspirin: Secondary | ICD-10-CM | POA: Insufficient documentation

## 2022-09-21 DIAGNOSIS — I10 Essential (primary) hypertension: Secondary | ICD-10-CM | POA: Insufficient documentation

## 2022-09-21 DIAGNOSIS — E1165 Type 2 diabetes mellitus with hyperglycemia: Secondary | ICD-10-CM | POA: Insufficient documentation

## 2022-09-21 DIAGNOSIS — R739 Hyperglycemia, unspecified: Secondary | ICD-10-CM

## 2022-09-21 DIAGNOSIS — Z794 Long term (current) use of insulin: Secondary | ICD-10-CM | POA: Diagnosis not present

## 2022-09-21 DIAGNOSIS — Z79899 Other long term (current) drug therapy: Secondary | ICD-10-CM | POA: Diagnosis not present

## 2022-09-21 DIAGNOSIS — Z7951 Long term (current) use of inhaled steroids: Secondary | ICD-10-CM | POA: Insufficient documentation

## 2022-09-21 LAB — COMPREHENSIVE METABOLIC PANEL
ALT: 15 U/L (ref 0–44)
AST: 12 U/L — ABNORMAL LOW (ref 15–41)
Albumin: 4.4 g/dL (ref 3.5–5.0)
Alkaline Phosphatase: 121 U/L (ref 38–126)
Anion gap: 10 (ref 5–15)
BUN: 32 mg/dL — ABNORMAL HIGH (ref 6–20)
CO2: 21 mmol/L — ABNORMAL LOW (ref 22–32)
Calcium: 9.5 mg/dL (ref 8.9–10.3)
Chloride: 98 mmol/L (ref 98–111)
Creatinine, Ser: 1.71 mg/dL — ABNORMAL HIGH (ref 0.61–1.24)
GFR, Estimated: 47 mL/min — ABNORMAL LOW (ref >60)
Glucose, Bld: 567 mg/dL (ref 70–99)
Potassium: 5.6 mmol/L — ABNORMAL HIGH (ref 3.5–5.1)
Sodium: 129 mmol/L — ABNORMAL LOW (ref 135–145)
Total Bilirubin: 0.8 mg/dL (ref 0.3–1.2)
Total Protein: 7.6 g/dL (ref 6.5–8.1)

## 2022-09-21 LAB — URINALYSIS, ROUTINE W REFLEX MICROSCOPIC
Bacteria, UA: NONE SEEN
Bilirubin Urine: NEGATIVE
Glucose, UA: >=500 mg/dL — AB
Ketones, ur: NEGATIVE mg/dL
Leukocytes,Ua: NEGATIVE
Nitrite: NEGATIVE
Protein, ur: 30 mg/dL — AB
Specific Gravity, Urine: 1.025 (ref 1.005–1.030)
pH: 5 (ref 5.0–8.0)

## 2022-09-21 LAB — CBG MONITORING, ED
Glucose-Capillary: 545 mg/dL (ref 70–99)
Glucose-Capillary: 323 mg/dL — ABNORMAL HIGH (ref 70–99)
Glucose-Capillary: 301 mg/dL — ABNORMAL HIGH (ref 70–99)

## 2022-09-21 LAB — CBC
HCT: 38.7 % — ABNORMAL LOW (ref 39.0–52.0)
Hemoglobin: 13.5 g/dL (ref 13.0–17.0)
MCH: 27.9 pg (ref 26.0–34.0)
MCHC: 34.9 g/dL (ref 30.0–36.0)
MCV: 80 fL (ref 80.0–100.0)
Platelets: 153 10*3/uL (ref 150–400)
RBC: 4.84 MIL/uL (ref 4.22–5.81)
RDW: 12.8 % (ref 11.5–15.5)
WBC: 6.2 10*3/uL (ref 4.0–10.5)
nRBC: 0 % (ref 0.0–0.2)

## 2022-09-21 LAB — BETA-HYDROXYBUTYRIC ACID: Beta-Hydroxybutyric Acid: 0.11 mmol/L (ref 0.05–0.27)

## 2022-09-21 LAB — HEMOGLOBIN A1C
Hgb A1c MFr Bld: 10.8 % — ABNORMAL HIGH (ref 4.8–5.6)
Mean Plasma Glucose: 263.26 mg/dL

## 2022-09-21 LAB — OSMOLALITY: Osmolality: 311 mosm/kg — ABNORMAL HIGH (ref 275–295)

## 2022-09-21 MED ORDER — INSULIN ASPART 100 UNIT/ML IJ SOLN
8.0000 [IU] | Freq: Once | INTRAMUSCULAR | Status: DC
  Filled 2022-09-21: qty 0.08

## 2022-09-21 MED ORDER — INSULIN ASPART 100 UNIT/ML IJ SOLN
5.0000 [IU] | Freq: Once | INTRAMUSCULAR | Status: AC
  Administered 2022-09-21: 5 [IU] via SUBCUTANEOUS
  Filled 2022-09-21: qty 0.05

## 2022-09-21 MED ORDER — INSULIN ASPART 100 UNIT/ML IJ SOLN
7.0000 [IU] | Freq: Once | INTRAMUSCULAR | Status: DC
  Filled 2022-09-21: qty 0.07

## 2022-09-21 MED ORDER — DEXTROSE IN LACTATED RINGERS 5 % IV SOLN: INTRAVENOUS | Status: DC

## 2022-09-21 MED ORDER — GLIPIZIDE 5 MG PO TABS: 5.0000 mg | ORAL_TABLET | Freq: Two times a day (BID) | ORAL | 0 refills | Status: AC

## 2022-09-21 MED ORDER — LACTATED RINGERS IV SOLN: INTRAVENOUS | Status: DC

## 2022-09-21 MED ORDER — LIVING WELL WITH DIABETES BOOK
Freq: Once | Status: AC
  Filled 2022-09-21: qty 1

## 2022-09-21 MED ORDER — LACTATED RINGERS IV BOLUS
2000.0000 mL | Freq: Once | INTRAVENOUS | Status: AC
  Administered 2022-09-21: 2000 mL via INTRAVENOUS

## 2022-09-21 MED ORDER — DEXTROSE 50 % IV SOLN: 0.0000 mL | INTRAVENOUS | Status: DC | PRN

## 2022-09-21 MED ORDER — INSULIN ASPART 100 UNIT/ML IJ SOLN
10.0000 [IU] | Freq: Once | INTRAMUSCULAR | Status: AC
  Administered 2022-09-21: 10 [IU] via SUBCUTANEOUS
  Filled 2022-09-21: qty 0.1

## 2022-09-21 NOTE — ED Provider Notes (Signed)
[facility] DEPT Provider Note   CSN: [id number] Arrival date & time: 09/21/22  1157     History Chief Complaint  Patient presents with   Hyperglycemia    Ian Hall is a 56 y.o. 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  Patient reports that he was undergoing follow up laboratory tests per his transplant doctor and was called back as he was found to have a BG up to 407 ~5 days ago. He was prompted to go to ED, but patient was not able for cost concerns. He received a follow up call today and urged him to come to the nearest ED for a BG recheck.  On presentation, patient reports that he did not have a previous history of diabetes. Upon further questioning, however, he reports he has had blurry vision for ~1week, increased thirst, and increased urination (~every 1.5 hrs). Patient denies nausea, vomiting, dizziness, new abdominal discomfort, dysuria or changes in the color or quality of his urine.  He has been compliant with Cellcept 562m BID and tacrolimus 669mBID; his one-year anniversary from transplant is coming up. Patient was previously on prednisone, but denies treatment with this medication more than a couple of months. [facility] He is also compliant with bactrim. Other medications include ASA, amlodipine.   The history is provided by the patient and medical records. No language interpreter was used.  Hyperglycemia Onset quality:  Unable to specify Timing:  Unable to specify Progression:  Unable to specify Chronicity:  New Diabetes status:  Non-diabetic Ineffective treatments:  None tried Associated symptoms: blurred vision, increased thirst and polyuria   Associated symptoms: no abdominal pain, no altered mental status, no diaphoresis, no dizziness, no dysuria, no fatigue, no fever, no nausea,  no shortness of breath, no vomiting and no weakness        Home Medications Prior to Admission medications   Medication Sig Start Date End Date Taking? Authorizing Provider  acetaminophen (TYLENOL) 500 MG tablet Take 500 mg by mouth every 6 (six) hours as needed for moderate pain or mild pain.   Yes [provider]  albuterol (VENTOLIN HFA) 108 (90 Base) MCG/ACT inhaler INHALE 2 PUFFS INTO THE LUNGS EVERY 6 HOURS AS NEEDED FOR WHEEZING OR SHORTNESS OF BREATH Patient taking differently: Inhale 1-2 puffs into the lungs every 4 (four) hours as needed for wheezing or shortness of breath. Proventil HFA 90 mcg Inhale 1-2 puffs into the lungs every 4-6 hours as needed for wheezing or shortness of breath 04/28/21  Yes MoMaximiano CossNP  ASPIRIN LOW DOSE 81 MG tablet Take 81 mg by mouth daily. 08/28/22  Yes [provider]  docusate sodium (COLACE) 100 MG capsule Take 100 mg by mouth 2 (two) times daily as needed. 08/28/22  Yes [provider]  fluticasone furoate-vilanterol (BREO ELLIPTA) 100-25 MCG/INH AEPB Inhale 1 puff into the lungs daily. 06/03/21  Yes DeFreddi StarrMD  mycophenolate (CELLCEPT) 500 MG tablet Take 500 mg by mouth 2 (two) times daily. 07/30/22  Yes [provider]  sertraline (ZOLOFT) 25 MG tablet Take 25 mg by mouth daily. 08/20/21  Yes [provider]  Specialty Vitamins Products (MAGNESIUM, AMINO ACID CHELATE,) 133 MG tablet Take 2 tablets by mouth 3 (three) times daily. 09/14/22  Yes [provider]  sulfamethoxazole-trimethoprim (BACTRIM) 400-80 MG tablet Take 1  tablet by mouth 3 (three) times a week. 07/31/22  Yes [provider]  tacrolimus (PROGRAF) 1 MG capsule Take by mouth. 08/26/22  Yes [provider]  tacrolimus (PROGRAF) 5 MG capsule Take 5 mg by mouth 2 (two) times daily. 08/26/22  Yes [provider]  predniSONE (DELTASONE) 5 MG tablet Take by mouth 3 (three) times daily. Patient not  taking: Reported on 09/21/2022 07/30/22   [provider]      Allergies    Hydrocodone    Review of Systems   Review of Systems  Constitutional:  Negative for diaphoresis, fatigue and fever.  Eyes:  Positive for blurred vision.  Respiratory:  Negative for shortness of breath.   Gastrointestinal:  Negative for abdominal pain, nausea and vomiting.  Endocrine: Positive for polydipsia and polyuria.  Genitourinary:  Negative for dysuria.  Neurological:  Negative for dizziness and weakness.    Physical Exam Updated Vital Signs BP (!) 155/97   Pulse 73   Temp 97.8 F (36.6 C) (Oral)   Resp 18   Ht 5' 7"  (1.702 m)   Wt 97.1 kg   SpO2 100%   BMI 33.52 kg/m  Physical Exam  ED Results / Procedures / Treatments   Labs (all labs ordered are listed, but only abnormal results are displayed) Labs Reviewed  CBC - Abnormal; Notable for the following components:      Result Value   HCT 38.7 (*)    All other components within normal limits  URINALYSIS, ROUTINE W REFLEX MICROSCOPIC - Abnormal; Notable for the following components:   Color, Urine STRAW (*)    Glucose, UA >=500 (*)    Hgb urine dipstick SMALL (*)    Protein, ur 30 (*)    All other components within normal limits  COMPREHENSIVE METABOLIC PANEL - Abnormal; Notable for the following components:   Sodium 129 (*)    Potassium 5.6 (*)    CO2 21 (*)    Glucose, Bld 567 (*)    BUN 32 (*)    Creatinine, Ser 1.71 (*)    AST 12 (*)    GFR, Estimated 47 (*)    All other components within normal limits  CBG MONITORING, ED - Abnormal; Notable for the following components:   Glucose-Capillary 545 (*)    All other components within normal limits  CBG MONITORING, ED - Abnormal; Notable for the following components:   Glucose-Capillary 323 (*)    All other components within normal limits  BETA-HYDROXYBUTYRIC ACID  HEMOGLOBIN A1C  OSMOLALITY  CBG MONITORING, ED    EKG None  Radiology No results  found.  Procedures Procedures   Medications Ordered in ED Medications  lactated ringers infusion (0 mLs Intravenous Hold 09/21/22 1309)  dextrose 5 % in lactated ringers infusion (0 mLs Intravenous Hold 09/21/22 1309)  dextrose 50 % solution 0-50 mL (has no administration in time range)  living well with diabetes book MISC (has no administration in time range)  insulin aspart (novoLOG) injection 10 Units (10 Units Subcutaneous Given 09/21/22 1239)  lactated ringers bolus 2,000 mL (2,000 mLs Intravenous New Bag/Given 09/21/22 1506)    ED Course/ Medical Decision Making/ A&P                           Medical Decision Making Amount and/or Complexity of Data Reviewed External Data Reviewed: labs and notes. Labs: ordered.  Risk Prescription drug management. Drug therapy requiring intensive monitoring for toxicity.  GRAYCEN SADLON is a 56 y.o. 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 This is likely in the setting of metabolic abnormalities secondary to his transplant medications.  Patient is currently asymptomatic and HDS. BG was 545 on presentation. U/A + for glucosuria without evidence of ketonuria. No leukocytosis. BHB wnl. Corrected Na 136. K 5.6.  Cr at  -- upon CareEverywhere review, patient's Cr baseline 1.2-1.6. Hemoglobin A1c, pending.   No suspicion for infectious etiologies at this time. No concern for HHS  or DKA per laboratory review. Cr is slightly elevated when compared to his baseline, likely in setting of osmotic diuresis.   Patient received 10u Novolog; IVF with lactated ringers ordered. No K replacement needed at this time. Will continue to monitor cBG.   Patient has had a recent tacrolimus levels within therapeutic range. No need for repeat during this ED visit.  3:49PM repeat cBG 323. Patient  is still on IVF. Patient will receive 8units of Novolog.  Consults Diabetes management was consulted for bedside education. It was recommended patient to be admitted and on EndoTool; however patient was to be discharged today.  Disposition Given patient renal function, it was recommended patient be discharged on Glipizide 61m BID and close follow up with PCP. Patient reported difficulty affording doctor's appointments as he has not been able to establish with a PCP that is within his insurers network. He is not able to afford his medications currently. TOC consult has been consulted; patient will get contacted in outpatient setting. [facility] next [facility]. Patient is aware and stable for discharge.     Final Clinical Impression(s) / ED Diagnoses Final diagnoses:  Hyperglycemia    Rx / DC Orders ED Discharge Orders          Ordered    Ambulatory referral to Nutrition and Diabetic Education        09/21/22 1537              GRomana Juniper MD 09/21/22 1702    DGodfrey Pick MD 09/21/22 1906

## 2022-09-21 NOTE — ED Triage Notes (Addendum)
Patient had blood work completed on 03/17/22. Glucose level-407. CBG in triage-545  Patient reports that he has had urinary frequency.

## 2022-09-21 NOTE — ED Provider Triage Note (Signed)
Emergency Medicine Provider Triage Evaluation Note  BARRETT GOLDIE , a 56 y.o. male  was evaluated in triage.  Pt complains of high blood sugar.  Patient reports he is status post liver transplant 1 year ago with [facility] in [place].  The patient reports that on the 19th of this month, he reported to the liver transplant specialist for lab work.  Patient reports that today he was called stating that he has had high blood sugar on lab work recently.  The patient was advised to report to the ED.  The patient is endorsing urinary frequency however denies any excessive thirst or hunger.  Patient denies any abdominal pain or diarrhea, fever, nausea or vomiting.  Patient states he is currently taking Prograf for his liver transplant.  Patient denies any changes to skin of his abdominal wall.  Review of Systems  Positive:  Negative:   Physical Exam  BP (!) 140/98 (BP Location: Left Arm)   Pulse 69   Temp 97.8 F (36.6 C) (Oral)   Resp 16   Ht 5' 7"  (1.702 m)   Wt 97.1 kg   SpO2 95%   BMI 33.52 kg/m  Gen:   Awake, no distress   Resp:  Normal effort  MSK:   Moves extremities without difficulty  Other:    Medical Decision Making  Medically screening exam initiated at 12:28 PM.  Appropriate orders placed.  MOMIN MISKO was informed that the remainder of the evaluation will be completed by another provider, this initial triage assessment does not replace that evaluation, and the importance of remaining in the ED until their evaluation is complete.     Azucena Cecil, PA-C 09/21/22 1229

## 2022-09-21 NOTE — Inpatient Diabetes Management (Signed)
Inpatient Diabetes Program Recommendations  AACE/ADA: New Consensus Statement on Inpatient Glycemic Control (2015)  Target Ranges:  Prepandial:   less than 140 mg/dL      Peak postprandial:   less than 180 mg/dL (1-2 hours)      Critically ill patients:  140 - 180 mg/dL   Lab Results  Component Value Date   GLUCAP 545 (HH) 09/21/2022   HGBA1C 4.3 (L) 02/04/2021    Review of Glycemic Control  Latest Reference Range & Units 09/21/22 12:10  Glucose-Capillary 70 - 99 mg/dL 545 (HH)  ([place]): Data is critically high Diabetes history: New onset DM Outpatient Diabetes medications: none Current orders for Inpatient glycemic control: Novolog 10 units x 1 S/p liver transplant not on oral Prednisone as 02/2022 Inpatient Diabetes Program Recommendations:    Consider Endox2 Endotool for hyperglycemia.   Ordered LWWDM, dietitian and outpt education referral.   A1C pending.   Spoke with patient regarding new onset DM. Admits to polyuria and recent fatigue. Reviewed patho of DM, role of pancreas, A1C test, awaiting A1C lab result, role of insulin, impact of poor glycemic control s/p transplant, survival skills, interventions, target goals, when to call PCP, importance of following up with PCP, recommended frequency of checking CBGs, impact of immunosuppressive drugs and risk for diabetes, vascular changes and commorbidities.   Patient will need glucose meter at discharge #[id number]. Reviewed Freestyle libre benefits and cost. Patient is unable tot afford, thus not interested. Reviewed frequency for checking CBGs and target goals. Encouraged patient to make appointment with PCP. Has had difficulties so will include TOC.  Denies drinking sugary beverages. Tries to eat "in balance". Reviewed plate method, importance of being mindful of CHO intake and reviewed foods higher in CHO.  Discussed with R. Dixon. Included Lamarco Gudiel, EMT into conversation to begin assisting with checking CBGs and performing  injections.   Thanks, Bronson Curb, MSN, RNC-OB Diabetes Coordinator [phone] (8a-5p)

## 2022-09-21 NOTE — Discharge Instructions (Addendum)
Dear Ian Hall,  You were seen in the emergency department because your blood sugar was very high. We think this is likely because of the medications you take for your liver transplant increase the risk of this happening.   It is important that you take this new medication called Glipizide 31m twice a day. It is also important that you obtain a glucose meter as this will help you monitor your blood sugars at home.   Please watch for symptoms of low blood sugar such as confusion, sweaty hands, chills, nausea or vomiting. Please check your blood sugar and drink some orange juice and contact your doctor.  Please make sure you make a follow up appointment with a primary care provider as you will need diabetes monitoring from now on.   You will receive a phone call [facility]. Let them know about your insurance when they call you.  Take care of yourself,  MRomana Juniper MD

## 2022-09-28 ENCOUNTER — Telehealth: Payer: Self-pay | Admitting: Student

## 2022-09-28 NOTE — Telephone Encounter (Signed)
Please refer to message below.  Attempted to contact patient via telephone to schedule a new patient appointment at the request of Dr. Simeon Craft.  No answer, but left detailed message asking to please give our office a call back.

## 2022-09-28 NOTE — Telephone Encounter (Signed)
-----   Message from Romana Juniper, MD sent at 09/21/2022  4:56 PM EDT ----- Regarding: New patient appointment Hi team,  This patient is being discharged from the ED and is in need of a follow up appointment. Would it be possible to schedule him within one week of this ED discharge?  Thank you so much, Verdis Frederickson

## 2022-11-03 DIAGNOSIS — I10 Essential (primary) hypertension: Secondary | ICD-10-CM | POA: Insufficient documentation

## 2023-07-06 DIAGNOSIS — N183 Chronic kidney disease, stage 3 unspecified: Secondary | ICD-10-CM | POA: Insufficient documentation

## 2023-08-23 DIAGNOSIS — K831 Obstruction of bile duct: Secondary | ICD-10-CM | POA: Insufficient documentation

## 2023-09-27 ENCOUNTER — Ambulatory Visit (INDEPENDENT_AMBULATORY_CARE_PROVIDER_SITE_OTHER): Payer: Medicare Other | Admitting: Family Medicine

## 2023-09-27 ENCOUNTER — Encounter: Payer: Self-pay | Admitting: Family Medicine

## 2023-09-27 VITALS — BP 130/78 | HR 69 | Temp 97.4°F | Ht 67.5 in | Wt 196.8 lb

## 2023-09-27 DIAGNOSIS — Z7984 Long term (current) use of oral hypoglycemic drugs: Secondary | ICD-10-CM

## 2023-09-27 DIAGNOSIS — Z23 Encounter for immunization: Secondary | ICD-10-CM

## 2023-09-27 DIAGNOSIS — F1021 Alcohol dependence, in remission: Secondary | ICD-10-CM

## 2023-09-27 DIAGNOSIS — Z944 Liver transplant status: Secondary | ICD-10-CM | POA: Insufficient documentation

## 2023-09-27 DIAGNOSIS — Z79899 Other long term (current) drug therapy: Secondary | ICD-10-CM | POA: Insufficient documentation

## 2023-09-27 DIAGNOSIS — I10 Essential (primary) hypertension: Secondary | ICD-10-CM

## 2023-09-27 DIAGNOSIS — N1831 Chronic kidney disease, stage 3a: Secondary | ICD-10-CM | POA: Diagnosis not present

## 2023-09-27 DIAGNOSIS — Z5181 Encounter for therapeutic drug level monitoring: Secondary | ICD-10-CM

## 2023-09-27 DIAGNOSIS — M79641 Pain in right hand: Secondary | ICD-10-CM

## 2023-09-27 DIAGNOSIS — L299 Pruritus, unspecified: Secondary | ICD-10-CM | POA: Insufficient documentation

## 2023-09-27 DIAGNOSIS — M25512 Pain in left shoulder: Secondary | ICD-10-CM

## 2023-09-27 DIAGNOSIS — M79642 Pain in left hand: Secondary | ICD-10-CM

## 2023-09-27 DIAGNOSIS — Z86718 Personal history of other venous thrombosis and embolism: Secondary | ICD-10-CM | POA: Diagnosis not present

## 2023-09-27 DIAGNOSIS — M25511 Pain in right shoulder: Secondary | ICD-10-CM

## 2023-09-27 DIAGNOSIS — F339 Major depressive disorder, recurrent, unspecified: Secondary | ICD-10-CM | POA: Diagnosis not present

## 2023-09-27 DIAGNOSIS — E1122 Type 2 diabetes mellitus with diabetic chronic kidney disease: Secondary | ICD-10-CM | POA: Diagnosis not present

## 2023-09-27 DIAGNOSIS — Z125 Encounter for screening for malignant neoplasm of prostate: Secondary | ICD-10-CM

## 2023-09-27 DIAGNOSIS — M25571 Pain in right ankle and joints of right foot: Secondary | ICD-10-CM

## 2023-09-27 DIAGNOSIS — E782 Mixed hyperlipidemia: Secondary | ICD-10-CM | POA: Diagnosis not present

## 2023-09-27 DIAGNOSIS — G8929 Other chronic pain: Secondary | ICD-10-CM | POA: Insufficient documentation

## 2023-09-27 LAB — PSA: PSA: 0.57 ng/mL (ref 0.10–4.00)

## 2023-09-27 LAB — LIPID PANEL
Cholesterol: 128 mg/dL (ref 0–200)
HDL: 29.8 mg/dL — ABNORMAL LOW (ref >39.00)
LDL Cholesterol: 52 mg/dL (ref 0–99)
NonHDL: 97.74
Total CHOL/HDL Ratio: 4
Triglycerides: 228 mg/dL — ABNORMAL HIGH (ref 0.0–149.0)
VLDL: 45.6 mg/dL — ABNORMAL HIGH (ref 0.0–40.0)

## 2023-09-27 LAB — COMPREHENSIVE METABOLIC PANEL
ALT: 10 U/L (ref 0–53)
AST: 18 U/L (ref 0–37)
Albumin: 4.7 g/dL (ref 3.5–5.2)
Alkaline Phosphatase: 64 U/L (ref 39–117)
BUN: 24 mg/dL — ABNORMAL HIGH (ref 6–23)
CO2: 23 meq/L (ref 19–32)
Calcium: 9.5 mg/dL (ref 8.4–10.5)
Chloride: 106 meq/L (ref 96–112)
Creatinine, Ser: 1.26 mg/dL (ref 0.40–1.50)
GFR: 63.54 mL/min (ref >60.00)
Glucose, Bld: 126 mg/dL — ABNORMAL HIGH (ref 70–99)
Potassium: 4.6 meq/L (ref 3.5–5.1)
Sodium: 138 meq/L (ref 135–145)
Total Bilirubin: 0.4 mg/dL (ref 0.2–1.2)
Total Protein: 6.8 g/dL (ref 6.0–8.3)

## 2023-09-27 LAB — CBC WITH DIFFERENTIAL/PLATELET
Basophils Absolute: 0 10*3/uL (ref 0.0–0.1)
Basophils Relative: 0.8 % (ref 0.0–3.0)
Eosinophils Absolute: 0.1 10*3/uL (ref 0.0–0.7)
Eosinophils Relative: 1.6 % (ref 0.0–5.0)
HCT: 35.4 % — ABNORMAL LOW (ref 39.0–52.0)
Hemoglobin: 11.6 g/dL — ABNORMAL LOW (ref 13.0–17.0)
Lymphocytes Relative: 17.7 % (ref 12.0–46.0)
Lymphs Abs: 1 10*3/uL (ref 0.7–4.0)
MCHC: 32.7 g/dL (ref 30.0–36.0)
MCV: 83.1 fL (ref 78.0–100.0)
Monocytes Absolute: 0.4 10*3/uL (ref 0.1–1.0)
Monocytes Relative: 8.1 % (ref 3.0–12.0)
Neutro Abs: 3.9 10*3/uL (ref 1.4–7.7)
Neutrophils Relative %: 71.8 % (ref 43.0–77.0)
Platelets: 185 10*3/uL (ref 150.0–400.0)
RBC: 4.26 Mil/uL (ref 4.22–5.81)
RDW: 14.6 % (ref 11.5–15.5)
WBC: 5.4 10*3/uL (ref 4.0–10.5)

## 2023-09-27 LAB — TSH: TSH: 2.45 u[IU]/mL (ref 0.35–5.50)

## 2023-09-27 LAB — MICROALBUMIN / CREATININE URINE RATIO
Creatinine,U: 154.4 mg/dL
Microalb Creat Ratio: 14 mg/g (ref 0.0–30.0)
Microalb, Ur: 21.7 mg/dL — ABNORMAL HIGH (ref 0.0–1.9)

## 2023-09-27 LAB — HEMOGLOBIN A1C: Hgb A1c MFr Bld: 6.8 % — ABNORMAL HIGH (ref 4.6–6.5)

## 2023-09-27 MED ORDER — HYDROXYZINE HCL 10 MG PO TABS: 10.0000 mg | ORAL_TABLET | Freq: Three times a day (TID) | ORAL | 0 refills | Status: AC | PRN

## 2023-09-27 MED ORDER — SERTRALINE HCL 50 MG PO TABS
50.0000 mg | ORAL_TABLET | Freq: Every day | ORAL | 11 refills | Status: AC
Start: 2023-09-27 — End: 2024-09-21

## 2023-09-27 MED ORDER — DICLOFENAC SODIUM 1 % EX GEL: 4.0000 g | Freq: Four times a day (QID) | CUTANEOUS | 3 refills | Status: AC | PRN

## 2023-09-27 NOTE — Assessment & Plan Note (Signed)
Chronic.Well-managed on current medication. Plan: Continue Amlodipine 5 mg daily.

## 2023-09-27 NOTE — Assessment & Plan Note (Addendum)
Partially controlled on current medication;   Plan: Increase Sertraline to 50 mg daily. Reassess in one month to evaluate effectiveness.

## 2023-09-27 NOTE — Progress Notes (Signed)
Assessment/Plan:   Problem List Items Addressed This Visit       Cardiovascular and Mediastinum   Primary hypertension    Chronic.Well-managed on current medication. Plan: Continue Amlodipine 5 mg daily.      Relevant Medications   amLODipine (NORVASC) 5 MG tablet   Other Relevant Orders   TSH   Microalbumin / creatinine urine ratio   Vitamin D 1,25 dihydroxy   CBC with Differential/Platelet     Endocrine   Type 2 diabetes mellitus with stage 3a chronic kidney disease, without long-term current use of insulin (HCC)   Relevant Medications   metFORMIN (GLUCOPHAGE-XR) 500 MG 24 hr tablet   Other Relevant Orders   Hemoglobin A1c   Urinalysis w microscopic + reflex cultur     Musculoskeletal and Integument   Pruritus   Relevant Medications   hydrOXYzine (ATARAX) 10 MG tablet     Genitourinary   CKD (chronic kidney disease) stage 3, GFR 30-59 ml/min (HCC)    Blood sugar levels showing improvement on current regimen. Plan: Continue Metformin 500 mg twice daily and Glipizide 5 mg as needed. Follow up with endocrinologist.        Other   Encounter for therapeutic drug monitoring   Relevant Orders   AMB Referral [facility] Care Management   Major depressive disorder, recurrent episode (HCC)    Partially controlled on current medication;   Plan: Increase Sertraline to 50 mg daily. Reassess in one month to evaluate effectiveness.      Relevant Medications   sertraline (ZOLOFT) 50 MG tablet   hydrOXYzine (ATARAX) 10 MG tablet   Alcohol use disorder, severe, in sustained remission (HCC)   History of DVT (deep vein thrombosis) - Primary   Liver transplant status (HCC)    Patient with history of liver transplant secondary to alcoholic cirrhosis experiencing worsening pruritus.  No evidence of jaundice on exam.  Plan: Check liver function tests today. Prescribe Hydroxyzine 10 mg up to three times a day for itching. Continue current immunosuppressive regimen: Tacrolimus 1  mg daily, Mycophenolate 500 mg three times daily. Follow up with hepatologist immediately for any worsening symptoms such as jaundice. Re-evaluate in one month or sooner if symptoms worsen.      Relevant Orders   AMB Referral [facility] Care Management   Comprehensive metabolic panel   Bilateral hand pain   Relevant Medications   diclofenac Sodium (VOLTAREN) 1 % GEL   Chronic pain of right ankle    History of ankle fracture in 2002.  Status post ORIF.  Chronic pain affecting daily activities.  Plan: Prescribe Voltaren gel for topical pain relief. Refer to orthopedics and physical therapy. Continue Tylenol as needed.      Relevant Medications   sertraline (ZOLOFT) 50 MG tablet   diclofenac Sodium (VOLTAREN) 1 % GEL   Other Relevant Orders   Ambulatory referral to Orthopedics   Ambulatory referral to Physical Therapy   Mixed hyperlipidemia   Relevant Medications   amLODipine (NORVASC) 5 MG tablet   Other Relevant Orders   Lipid panel   Unspecified disorder of calcium metabolism   Relevant Orders   Vitamin D 1,25 dihydroxy   Other long term (current) drug therapy   Relevant Orders   PSA   Chronic pain of both shoulders   Relevant Medications   sertraline (ZOLOFT) 50 MG tablet   Other Visit Diagnoses     Immunization due       Relevant Orders   AMB Referral [facility] Care Management  Screening for malignant neoplasm of prostate       Relevant Orders   PSA       Medications Discontinued During This Encounter  Medication Reason   tacrolimus (PROGRAF) 5 MG capsule    sulfamethoxazole-trimethoprim (BACTRIM) 400-80 MG tablet    predniSONE (DELTASONE) 5 MG tablet    sertraline (ZOLOFT) 25 MG tablet Reorder    Return in about 4 weeks (around 10/25/2023) for depression.    Subjective:   Encounter date: 09/27/2023  Ian Hall is a 57 y.o. male who has Anxiety; Dyspepsia; Encounter for therapeutic drug monitoring; Major depressive disorder, recurrent episode (HCC);  Primary hypertension; CKD (chronic kidney disease) stage 3, GFR 30-59 ml/min (HCC); Gastroesophageal reflux disease without esophagitis; Biliary anastomotic stricture; Alcohol use disorder, severe, in sustained remission (HCC); History of DVT (deep vein thrombosis); Liver transplant status (HCC); Type 2 diabetes mellitus with stage 3a chronic kidney disease, without long-term current use of insulin (HCC); Bilateral hand pain; Chronic pain of right ankle; Pruritus; Mixed hyperlipidemia; Unspecified disorder of calcium metabolism; Other long term (current) drug therapy; and Chronic pain of both shoulders on their problem list..   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   Chief Complaint: Follow-up for multiple comorbidities.  History of Present Illness:  Liver Transplant: Patient had a liver transplant in the past year with episodes of mild rejection. Actively monitored with frequent blood tests. Worsening pruritus over the last week.  Hypertension: Managed with Amlodipine 5 mg daily.  Type 2 Diabetes: Blood glucose levels ranging from 120-149 mg/dL, managed with Metformin 500 mg twice daily and Glipizide 5 mg as needed.  Follows with endocrinology.  Depression: Managed with Sertraline 25 mg daily.  Reports worsening mood.  Chronic Pain: Right ankle, shoulder, and hand pain affecting daily activities and sleep. Managed with Tylenol but does not completely  alleviate pain.      09/27/2023    1:09 PM 11/18/2021    2:43 PM 09/03/2021   11:42 AM 04/03/2021    9:13 AM 03/19/2021    1:16 PM  Depression screen PHQ 2/9  Decreased Interest 1 0 0 0 0  Down, Depressed, Hopeless 3 3 0 0 0  PHQ - 2 Score 4 3 0 0 0  Altered sleeping 3 2 0    Tired, decreased energy 1 3 0    Change in appetite 0 2 0    Feeling bad or failure about yourself  2 2 0    Trouble concentrating 2 1 0    Moving slowly or fidgety/restless 1 3 0    Suicidal thoughts 0 0 0    PHQ-9 Score 13 16 0    Difficult doing work/chores Very difficult Not difficult at all Not difficult at all        09/27/2023    1:09 PM  GAD 7 : Generalized Anxiety Score  Nervous, Anxious, on Edge 2  Control/stop worrying 2  Worry too much - different things 2  Trouble relaxing 2  Restless 2  Easily annoyed or irritable 1  Afraid - awful might happen 2  Total  GAD 7 Score 13  Anxiety Difficulty Somewhat difficult    Review of Systems  Constitutional:  Positive for malaise/fatigue. Negative for chills, diaphoresis, fever and weight loss.  HENT:  Negative for congestion, ear discharge, ear pain and hearing loss.   Eyes:  Negative for blurred vision, double vision, photophobia, pain, discharge and redness.  Respiratory:  Negative for cough, sputum production, shortness of breath and wheezing.   Cardiovascular:  Positive for leg swelling. Negative for chest pain and palpitations.  Gastrointestinal:  Negative for abdominal pain, blood in stool, constipation, diarrhea, heartburn, melena, nausea and vomiting.  Genitourinary:  Negative for dysuria, flank pain, frequency, hematuria and urgency.  Musculoskeletal:  Positive for joint pain. Negative for myalgias.  Skin:  Positive for itching. Negative for rash.  Neurological:  Negative for dizziness, tingling, tremors, speech change, seizures, loss of consciousness, weakness and headaches.  Endo/Heme/Allergies:  Negative for polydipsia.   Psychiatric/Behavioral:  Positive for depression. Negative for hallucinations, memory loss, substance abuse (Reports complete cessation of alcohol) and suicidal ideas. The patient is nervous/anxious. The patient does not have insomnia.   All other systems reviewed and are negative.   Past Surgical History:  Procedure Laterality Date   ANKLE SURGERY Right    IR PARACENTESIS  01/07/2021   IR PARACENTESIS  01/30/2021   IR PARACENTESIS  05/12/2021   IR PARACENTESIS  05/27/2021   IR PARACENTESIS  08/11/2021   IR PARACENTESIS  09/28/2021   IR TRANSCATHETER BX  01/09/2021   IR US GUIDE VASC ACCESS RIGHT  01/09/2021   IR VENOGRAM HEPATIC W HEMODYNAMIC EVALUATION  01/09/2021   LIVER TRANSPLANT  2022    Outpatient Medications Prior to Visit  Medication Sig Dispense Refill   ACCU-CHEK GUIDE test strip USE AS DIRECTED TWO TIMES A DAY AS NEEDED     acetaminophen (TYLENOL) 500 MG tablet Take 500 mg by mouth every 6 (six) hours as needed for moderate pain or mild pain.     albuterol (VENTOLIN HFA) 108 (90 Base) MCG/ACT inhaler INHALE 2 PUFFS INTO THE LUNGS EVERY 6 HOURS AS NEEDED FOR WHEEZING OR SHORTNESS OF BREATH 6.7 g 3   amLODipine (NORVASC) 5 MG tablet Take 5 mg by mouth daily.     ASPIRIN LOW DOSE 81 MG tablet Take 81 mg by mouth daily.     Continuous Glucose Receiver (FREESTYLE LIBRE 3 READER) DEVI CHECK GLUCOSE FASTING AND TWO HOUR POST PRANDIAL     Continuous Glucose Sensor (FREESTYLE LIBRE 3 SENSOR) MISC PLACE ONE SENSOR TO THE BACK OF YOUR UPPER ARM. REPLACE EVERY 14 DAYS.     docusate sodium (COLACE) 100 MG capsule Take 100 mg by mouth 2 (two) times daily as needed.     fluticasone furoate-vilanterol (BREO ELLIPTA) 100-25 MCG/INH AEPB Inhale 1 puff into the lungs daily. 28 each 6   glucose blood (PRECISION QID TEST) test strip Check glucose twice daily as needed.     Lancets (FREESTYLE) lancets 1 Units by Misc.(Non-Drug; Combo Route) route 2 times daily.     metFORMIN (GLUCOPHAGE-XR)  500 MG 24 hr tablet Take 1,000 mg by mouth every morning.     mycophenolate (CELLCEPT) 500 MG tablet Take 500 mg by mouth 2 (two) times daily.     Specialty Vitamins Products (MAGNESIUM, AMINO ACID CHELATE,) 133 MG tablet Take 2 tablets by mouth 3 (three) times daily.     tacrolimus (PROGRAF) 1 MG capsule Take by mouth.     predniSONE (DELTASONE) 5 MG tablet Take by mouth 3 (  three) times daily.     sertraline (ZOLOFT) 25 MG tablet Take 25 mg by mouth daily.     glipiZIDE (GLUCOTROL) 5 MG tablet Take 1 tablet (5 mg total) by mouth 2 (two) times daily before a meal. 60 tablet 0   sulfamethoxazole-trimethoprim (BACTRIM) 400-80 MG tablet Take 1 tablet by mouth 3 (three) times a week.     tacrolimus (PROGRAF) 5 MG capsule Take 5 mg by mouth 2 (two) times daily.     Facility-Administered Medications Prior to Visit  Medication Dose Route Frequency Provider Last Rate Last Admin   albumin human 25 % solution 100 g  100 g Intravenous Once Tressia Danas, MD        Family History  Problem Relation Age of Onset   Diabetes Mother    Memory loss Mother    Diabetes Father    Diabetes Cousin    Colon cancer Neg Hx    Esophageal cancer Neg Hx     Social History   Socioeconomic History   Marital status: Married    Spouse name: Not on file   Number of children: 0   Years of education: Not on file   Highest education level: 6th grade  Occupational History   Occupation: Chief of Staff: [employer]  Tobacco Use   Smoking status: Former    Current packs/day: 0.00    Average packs/day: 0.5 packs/day for 23.0 years (11.5 ttl pk-yrs)    Types: Cigarettes    Start date: 62    Quit date: 12/16/2002    Years since quitting: 20.7   Smokeless tobacco: Never  Vaping Use   Vaping status: Never Used  Substance and Sexual Activity   Alcohol use: Not Currently    Comment: none for 3-4 years (stated 12/16/2020)   Drug use: No   Sexual activity: Yes    Birth control/protection:  None  Other Topics Concern   Not on file  Social History Narrative   Not on file   Social Determinants of Health   Financial Resource Strain: High Risk (09/23/2023)   Overall Financial Resource Strain (CARDIA)    Difficulty of Paying Living Expenses: Hard  Food Insecurity: Low Risk  (08/23/2023)   Received from [facility]   Hunger Vital Sign    Worried About Running Out of Food in the Last Year: Never true    Ran Out of Food in the Last Year: Never true  Transportation Needs: No Transportation Needs (09/23/2023)   PRAPARE - Administrator, Civil Service (Medical): No    Lack of Transportation (Non-Medical): No  Physical Activity: Sufficiently Active (09/23/2023)   Exercise Vital Sign    Days of Exercise per Week: 7 days    Minutes of Exercise per Session: 50 min  Stress: No Stress Concern Present (09/23/2023)   [employer] of Occupational Health - Occupational Stress Questionnaire    Feeling of Stress : Only a little  Social Connections: Unknown (09/23/2023)   Social Connection and Isolation Panel [NHANES]    Frequency of Communication with Friends and Family: Three times a week    Frequency of Social Gatherings with Friends and Family: Once a week    Attends Religious Services: 1 to 4 times per year    Active Member of [employer] or Organizations: No    Attends Engineer, structural: Not on file    Marital Status: Not on file  Intimate Partner Violence: Not on file  Objective:  Physical Exam: BP 130/78 (BP Location: Left Arm, Patient Position: Sitting, Cuff Size: Large)   Pulse 69   Temp (!) 97.4 F (36.3 C) (Temporal)   Ht 5' 7.5" (1.715 m)   Wt 196 lb 12.8 oz (89.3 kg)   SpO2 99%   BMI 30.37 kg/m     Physical Exam Constitutional:      Appearance: Normal appearance.  HENT:     Head: Normocephalic and atraumatic.     Right Ear: Hearing  normal.     Left Ear: Hearing normal.     Nose: Nose normal.  Eyes:     General: No scleral icterus.       Right eye: No discharge.        Left eye: No discharge.     Extraocular Movements: Extraocular movements intact.  Cardiovascular:     Rate and Rhythm: Normal rate and regular rhythm.     Heart sounds: Normal heart sounds.  Pulmonary:     Effort: Pulmonary effort is normal.     Breath sounds: Normal breath sounds.  Abdominal:     Palpations: Abdomen is soft.     Tenderness: There is no abdominal tenderness.  Skin:    General: Skin is warm.     Coloration: Skin is not jaundiced.     Findings: No rash.  Neurological:     General: No focal deficit present.     Mental Status: He is alert.     Cranial Nerves: No cranial nerve deficit.  Psychiatric:        Mood and Affect: Affect is not inappropriate.        Behavior: Behavior is cooperative.        Thought Content: Thought content does not include homicidal or suicidal ideation.        Judgment: Judgment normal.     No results found.  No results found for this or any previous visit (from the past 2160 hour(s)).      Garner Nash, MD, MS

## 2023-09-27 NOTE — Assessment & Plan Note (Addendum)
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  No evidence of jaundice on exam.  Plan: Check liver function tests today. Prescribe Hydroxyzine 10 mg up to three times a day for itching. Continue current immunosuppressive regimen: Tacrolimus 1 mg daily, Mycophenolate 500 mg three times daily. Follow up with hepatologist immediately for any worsening symptoms such as jaundice. Re-evaluate in one month or sooner if symptoms worsen.

## 2023-09-27 NOTE — Patient Instructions (Signed)
Increase Sertraline 50 mg daily Use Hydroxyzine 10 mg up to three times a day for itching, and apply Voltaren gel for pain in your ankle, shoulder, and hand as instructed. We are checking lab work for liver function and other conditions Schedule follow-up appointments with orthopedics and physical therapy for chronic pain management, and ensure you see your endocrinologist regularly for diabetes care. Reach out to your hepatologist immediately if you notice worsening itching, yellowing of your skin or eyes, or any other concerning symptoms.

## 2023-09-27 NOTE — Assessment & Plan Note (Addendum)
History of ankle fracture in 2002.  Status post ORIF.  Chronic pain affecting daily activities.  Plan: Prescribe Voltaren gel for topical pain relief. Refer to orthopedics and physical therapy. Continue Tylenol as needed.

## 2023-09-27 NOTE — Assessment & Plan Note (Signed)
Blood sugar levels showing improvement on current regimen. Plan: Continue Metformin 500 mg twice daily and Glipizide 5 mg as needed. Follow up with endocrinologist.

## 2023-09-30 ENCOUNTER — Telehealth: Payer: Self-pay

## 2023-09-30 LAB — URINALYSIS W MICROSCOPIC + REFLEX CULTURE
Bacteria, UA: NONE SEEN /[HPF]
Bilirubin Urine: NEGATIVE
Glucose, UA: NEGATIVE
Hgb urine dipstick: NEGATIVE
Hyaline Cast: NONE SEEN /[LPF]
Ketones, ur: NEGATIVE
Leukocyte Esterase: NEGATIVE
Nitrites, Initial: NEGATIVE
RBC / HPF: NONE SEEN /[HPF] (ref 0–2)
Specific Gravity, Urine: 1.02 (ref 1.001–1.035)
Squamous Epithelial / HPF: NONE SEEN /[HPF] (ref <=5)
WBC, UA: NONE SEEN /[HPF] (ref 0–5)
pH: <=5 (ref 5.0–8.0)

## 2023-09-30 LAB — VITAMIN D 1,25 DIHYDROXY
Vitamin D 1, 25 (OH)2 Total: 28 pg/mL (ref 18–72)
Vitamin D2 1, 25 (OH)2: <8 pg/mL
Vitamin D3 1, 25 (OH)2: 28 pg/mL

## 2023-09-30 LAB — NO CULTURE INDICATED

## 2023-09-30 NOTE — Progress Notes (Signed)
   Care Guide Note  09/30/2023 Name: Ian Hall MRN: [id number] DOB: 10/06/1966  Referred by: Garnette Gunner, MD Reason for referral : Care Coordination (Outreach to schedule with Pharm d )   Ian Hall is a 57 y.o. year old male who is a primary care patient of Garnette Gunner, MD. Ian Hall was referred to the pharmacist for assistance related to  med review  .    Successful contact was made with the patient to discuss pharmacy services including being ready for the pharmacist to call at least 5 minutes before the scheduled appointment time, to have medication bottles and any blood sugar or blood pressure readings ready for review. The patient agreed to meet with the pharmacist via with the pharmacist via telephone visit on (date/time).  10/04/2023  Penne Lash, RMA Care Guide [facility]  [place], [place] [address] Direct Dial: [phone] [email or web address]

## 2023-10-04 ENCOUNTER — Other Ambulatory Visit: Payer: Medicare Other

## 2023-10-04 NOTE — Progress Notes (Signed)
   10/04/2023  Patient ID: Ian Hall, male   DOB: May 20, 1966, 56 y.o.   MRN: [id number]  S/O Telephone visit to review current medication list to determine appropriateness of high-dose flu and COVID19 vaccines in immunocompromised with PMH of liver transplantation  Medication Consultation -Patient was not home where he could access his current medications/medication list, and he states he is seeing his liver transplant & care provider next week and plans to obtain recommendations around high-dose flu and COVID19 vaccinations -Reviewed medication list (compared with pharmacy fill history for accuracy), and there are no medications that would make these vaccines be contraindicated -As of August 2024, the CDC recommends "those 6 months and older that are immunocompromised and have never received a COVID-19 vaccine should get their first series of 2024-2025 vaccine doses from the same brand. This would be 3 doses of Pfizer-BioNTech or Moderna or 2 doses of Novavax."  -In regard to the flu vaccine, October 2024 the CDC states that "solid organ transplant recipients who are 46 through 57 years of age and who are receiving immunosuppressive medication regimens may receive high-dose inactivated flu vaccine or adjuvanted inactivated flu vaccine (which are currently approved for people ages 88 years and older). These vaccines are not preferred over other age-appropriate flu vaccines but are acceptable options for this population."  A/P  Medication Consultation -Patient plans to defer to liver transplant & care provider for Flu and COVID19 vaccination recommendations -Based on current medication list and PMH of solid organ transplant, patient would benefit from COVID19 and INACTIVATED flu vaccine.  It is recommended he have a total series of COVID19 vaccines if not received previously.  Either high-dose or regular dose influenza vaccine would be appropriate.  Lenna Gilford, PharmD, DPLA

## 2023-10-10 ENCOUNTER — Emergency Department (HOSPITAL_COMMUNITY): Payer: Medicare Other

## 2023-10-10 ENCOUNTER — Encounter (HOSPITAL_COMMUNITY): Payer: Self-pay

## 2023-10-10 ENCOUNTER — Other Ambulatory Visit: Payer: Self-pay

## 2023-10-10 ENCOUNTER — Emergency Department (HOSPITAL_COMMUNITY)
Admission: EM | Admit: 2023-10-10 | Discharge: 2023-10-11 | Disposition: A | Discharge status: Home / Self Care | Payer: Medicare Other | Attending: Emergency Medicine | Admitting: Emergency Medicine

## 2023-10-10 DIAGNOSIS — Z87891 Personal history of nicotine dependence: Secondary | ICD-10-CM | POA: Insufficient documentation

## 2023-10-10 DIAGNOSIS — Z7984 Long term (current) use of oral hypoglycemic drugs: Secondary | ICD-10-CM | POA: Insufficient documentation

## 2023-10-10 DIAGNOSIS — R7989 Other specified abnormal findings of blood chemistry: Secondary | ICD-10-CM | POA: Diagnosis not present

## 2023-10-10 DIAGNOSIS — N183 Chronic kidney disease, stage 3 unspecified: Secondary | ICD-10-CM | POA: Diagnosis not present

## 2023-10-10 DIAGNOSIS — E871 Hypo-osmolality and hyponatremia: Secondary | ICD-10-CM | POA: Insufficient documentation

## 2023-10-10 DIAGNOSIS — E1122 Type 2 diabetes mellitus with diabetic chronic kidney disease: Secondary | ICD-10-CM | POA: Insufficient documentation

## 2023-10-10 DIAGNOSIS — R1011 Right upper quadrant pain: Secondary | ICD-10-CM | POA: Diagnosis not present

## 2023-10-10 DIAGNOSIS — Z7982 Long term (current) use of aspirin: Secondary | ICD-10-CM | POA: Insufficient documentation

## 2023-10-10 DIAGNOSIS — R109 Unspecified abdominal pain: Secondary | ICD-10-CM | POA: Diagnosis present

## 2023-10-10 LAB — CBC WITH DIFFERENTIAL/PLATELET
Abs Immature Granulocytes: 0.1 10*3/uL — ABNORMAL HIGH (ref 0.00–0.07)
Basophils Absolute: 0 10*3/uL (ref 0.0–0.1)
Basophils Relative: 0 %
Eosinophils Absolute: 0 10*3/uL (ref 0.0–0.5)
Eosinophils Relative: 0 %
HCT: 33.5 % — ABNORMAL LOW (ref 39.0–52.0)
Hemoglobin: 11 g/dL — ABNORMAL LOW (ref 13.0–17.0)
Lymphocytes Relative: 7 %
Lymphs Abs: 0.4 10*3/uL — ABNORMAL LOW (ref 0.7–4.0)
MCH: 27.6 pg (ref 26.0–34.0)
MCHC: 32.8 g/dL (ref 30.0–36.0)
MCV: 84.2 fL (ref 80.0–100.0)
Metamyelocytes Relative: 2 %
Monocytes Absolute: 0.1 10*3/uL (ref 0.1–1.0)
Monocytes Relative: 1 %
Neutro Abs: 5.8 10*3/uL (ref 1.7–7.7)
Neutrophils Relative %: 90 %
Platelets: 164 10*3/uL (ref 150–400)
RBC: 3.98 MIL/uL — ABNORMAL LOW (ref 4.22–5.81)
RDW: 14.3 % (ref 11.5–15.5)
WBC: 6.4 10*3/uL (ref 4.0–10.5)
nRBC: 0 % (ref 0.0–0.2)

## 2023-10-10 LAB — COMPREHENSIVE METABOLIC PANEL
ALT: 14 U/L (ref 0–44)
AST: 31 U/L (ref 15–41)
Albumin: 4.4 g/dL (ref 3.5–5.0)
Alkaline Phosphatase: 58 U/L (ref 38–126)
Anion gap: 12 (ref 5–15)
BUN: 17 mg/dL (ref 6–20)
CO2: 17 mmol/L — ABNORMAL LOW (ref 22–32)
Calcium: 8.9 mg/dL (ref 8.9–10.3)
Chloride: 103 mmol/L (ref 98–111)
Creatinine, Ser: 1.2 mg/dL (ref 0.61–1.24)
GFR, Estimated: >60 mL/min (ref >60)
Glucose, Bld: 230 mg/dL — ABNORMAL HIGH (ref 70–99)
Potassium: 4.4 mmol/L (ref 3.5–5.1)
Sodium: 132 mmol/L — ABNORMAL LOW (ref 135–145)
Total Bilirubin: 0.7 mg/dL (ref <1.2)
Total Protein: 7.4 g/dL (ref 6.5–8.1)

## 2023-10-10 LAB — TROPONIN I (HIGH SENSITIVITY)
Troponin I (High Sensitivity): <2 ng/L (ref <18)
Troponin I (High Sensitivity): 2 ng/L (ref <18)

## 2023-10-10 LAB — LIPASE, BLOOD: Lipase: 53 U/L — ABNORMAL HIGH (ref 11–51)

## 2023-10-10 MED ORDER — ONDANSETRON HCL 4 MG/2ML IJ SOLN
4.0000 mg | Freq: Once | INTRAMUSCULAR | Status: AC
  Administered 2023-10-10: 4 mg via INTRAVENOUS
  Filled 2023-10-10: qty 2

## 2023-10-10 MED ORDER — IOHEXOL 300 MG/ML  SOLN
100.0000 mL | Freq: Once | INTRAMUSCULAR | Status: AC | PRN
  Administered 2023-10-10: 100 mL via INTRAVENOUS

## 2023-10-10 MED ORDER — ONDANSETRON 4 MG PO TBDP: 4.0000 mg | ORAL_TABLET | Freq: Three times a day (TID) | ORAL | 0 refills | Status: AC | PRN

## 2023-10-10 MED ORDER — OXYCODONE HCL 5 MG PO TABS: 5.0000 mg | ORAL_TABLET | Freq: Four times a day (QID) | ORAL | 0 refills | Status: AC | PRN

## 2023-10-10 MED ORDER — FENTANYL CITRATE PF 50 MCG/ML IJ SOSY
25.0000 ug | PREFILLED_SYRINGE | Freq: Once | INTRAMUSCULAR | Status: AC
  Administered 2023-10-10: 25 ug via INTRAVENOUS
  Filled 2023-10-10: qty 1

## 2023-10-10 MED ORDER — OXYCODONE-ACETAMINOPHEN 5-325 MG PO TABS
2.0000 | ORAL_TABLET | Freq: Once | ORAL | Status: AC
Start: 2023-10-10 — End: 2023-10-10
  Administered 2023-10-10: 2 via ORAL
  Filled 2023-10-10 (×2): qty 2

## 2023-10-10 NOTE — ED Triage Notes (Signed)
Patient is here for evaluation of right sided chest pain. Pt had a biliary stent removed today in [place]. Pt reports pain after the procedure. Denies any nausea, vomiting or fevers.

## 2023-10-10 NOTE — ED Notes (Signed)
Patient came to the desk and stated " the pain medication made him worst.  I made RN Mardella Layman aware and she message PA.  Patient was upset and stated " I was rude for asking for a sticker.

## 2023-10-10 NOTE — ED Provider Notes (Incomplete)
[facility] AT [facility] Provider Note   CSN: [id number] Arrival date & time: 10/10/23  1636     History {Add pertinent medical, surgical, social history, OB history to HPI:1} Chief Complaint  Patient presents with  . Chest Pain    KIPLEY CAMMON is a 57 y.o. male.   Chest Pain   57 year old male presents emergency department with complaints of chest pain and abdominal pain.  Patient had ERCP performed for CBD stent replacement in [place] this morning.  States that when he woke up from anesthesia, had symptoms.  Patient told his GI doctor after awakening and was sent in pain medication for postop pain.  Patient reports pain despite using pain medication.  Reports 1 episode of feeling nauseous and vomiting when he is having his blood drawn here in the emergency department.  Denies any fever, shortness of breath, urinary symptoms, change in bowel habits.  Patient states that he "just wants to make sure that everything is okay."  Past medical history significant for cirrhosis with liver transplant, DVT, thrombocytopenia, alcohol abuse, diabetes mellitus, CKD 3  Home Medications Prior to Admission medications   Medication Sig Start Date End Date Taking? Authorizing Provider  ACCU-CHEK GUIDE test strip USE AS DIRECTED TWO TIMES A DAY AS NEEDED    [provider]  acetaminophen (TYLENOL) 500 MG tablet Take 500 mg by mouth every 6 (six) hours as needed for moderate pain or mild pain.    [provider]  albuterol (VENTOLIN HFA) 108 (90 Base) MCG/ACT inhaler INHALE 2 PUFFS INTO THE LUNGS EVERY 6 HOURS AS NEEDED FOR WHEEZING OR SHORTNESS OF BREATH 04/28/21   Janeece Agee, NP  amLODipine (NORVASC) 5 MG tablet Take 5 mg by mouth daily.    [provider]  ASPIRIN LOW DOSE 81 MG tablet Take 81 mg by mouth daily. 08/28/22   [provider]  Continuous Glucose Receiver (FREESTYLE LIBRE 3 READER) DEVI CHECK GLUCOSE FASTING AND  TWO HOUR POST PRANDIAL    [provider]  Continuous Glucose Sensor (FREESTYLE LIBRE 3 SENSOR) MISC PLACE ONE SENSOR TO THE BACK OF YOUR UPPER ARM. REPLACE EVERY 14 DAYS. 08/25/23   [provider]  diclofenac Sodium (VOLTAREN) 1 % GEL Apply 4 g topically 4 (four) times daily as needed. 09/27/23   Garnette Gunner, MD  docusate sodium (COLACE) 100 MG capsule Take 100 mg by mouth 2 (two) times daily as needed. 08/28/22   [provider]  fluticasone furoate-vilanterol (BREO ELLIPTA) 100-25 MCG/INH AEPB Inhale 1 puff into the lungs daily. 06/03/21   Martina Sinner, MD  glipiZIDE (GLUCOTROL) 5 MG tablet Take 1 tablet (5 mg total) by mouth 2 (two) times daily before a meal. 09/21/22 10/21/22  Morene Crocker, MD  glucose blood (PRECISION QID TEST) test strip Check glucose twice daily as needed. 11/03/22   [provider]  hydrOXYzine (ATARAX) 10 MG tablet Take 1 tablet (10 mg total) by mouth 3 (three) times daily as needed for itching. 09/27/23   Garnette Gunner, MD  Lancets (FREESTYLE) lancets 1 Units by Misc.(Non-Drug; Combo Route) route 2 times daily. 11/03/22   [provider]  metFORMIN (GLUCOPHAGE-XR) 500 MG 24 hr tablet Take 1,000 mg by mouth every morning.    [provider]  mycophenolate (CELLCEPT) 500 MG tablet Take 500 mg by mouth 2 (two) times daily. 07/30/22   [provider]  sertraline (ZOLOFT) 50 MG tablet Take 1 tablet (50 mg total)  by mouth daily. 09/27/23 09/21/24  Garnette Gunner, MD  Specialty Vitamins Products (MAGNESIUM, AMINO ACID CHELATE,) 133 MG tablet Take 2 tablets by mouth 3 (three) times daily. 09/14/22   [provider]  tacrolimus (PROGRAF) 1 MG capsule Take by mouth. 08/26/22   [provider]      Allergies    Hydrocodone    Review of Systems   Review of Systems  Cardiovascular:  Positive for chest pain.  All other systems reviewed and are negative.   Physical Exam Updated  Vital Signs BP (!) 142/82 (BP Location: Right Arm)   Pulse 94   Temp 98.3 F (36.8 C) (Oral)   Resp 18   Ht 5\' 7"  (1.702 m)   Wt 91.6 kg   SpO2 97%   BMI 31.63 kg/m  Physical Exam Vitals and nursing note reviewed.  Constitutional:      General: He is not in acute distress.    Appearance: He is well-developed.  HENT:     Head: Normocephalic and atraumatic.  Eyes:     Conjunctiva/sclera: Conjunctivae normal.  Cardiovascular:     Rate and Rhythm: Normal rate and regular rhythm.     Pulses: Normal pulses.     Heart sounds: No murmur heard. Pulmonary:     Effort: Pulmonary effort is normal. No respiratory distress.     Breath sounds: Normal breath sounds. No wheezing, rhonchi or rales.  Abdominal:     Palpations: Abdomen is soft.     Tenderness: There is abdominal tenderness. There is no right CVA tenderness, left CVA tenderness or guarding.     Comments: Patient with overall soft abdomen with slight pain right upper quadrant.  Musculoskeletal:        General: No swelling.     Cervical back: Neck supple.  Skin:    General: Skin is warm and dry.     Capillary Refill: Capillary refill takes less than 2 seconds.  Neurological:     Mental Status: He is alert.  Psychiatric:        Mood and Affect: Mood normal.     ED Results / Procedures / Treatments   Labs (all labs ordered are listed, but only abnormal results are displayed) Labs Reviewed  CBC WITH DIFFERENTIAL/PLATELET - Abnormal; Notable for the following components:      Result Value   RBC 3.98 (*)    Hemoglobin 11.0 (*)    HCT 33.5 (*)    Lymphs Abs 0.4 (*)    Abs Immature Granulocytes 0.10 (*)    All other components within normal limits  COMPREHENSIVE METABOLIC PANEL - Abnormal; Notable for the following components:   Sodium 132 (*)    CO2 17 (*)    Glucose, Bld 230 (*)    All other components within normal limits  LIPASE, BLOOD - Abnormal; Notable for the following components:   Lipase 53 (*)    All other  components within normal limits  TROPONIN I (HIGH SENSITIVITY)  TROPONIN I (HIGH SENSITIVITY)    EKG EKG Interpretation Date/Time:  Monday October 10 2023 17:03:11 EST Ventricular Rate:  61 PR Interval:  156 QRS Duration:  91 QT Interval:  368 QTC Calculation: 371 R Axis:   68  Text Interpretation: Sinus rhythm Abnormal R-wave progression, early transition Confirmed by Alvester Chou [phone]) on 10/10/2023 9:04:09 PM  Radiology DG Chest 2 View  Result Date: 10/10/2023 CLINICAL DATA:  Right chest pain EXAM: CHEST - 2 VIEW COMPARISON:  08/22/2021 FINDINGS: The  heart size and mediastinal contours are within normal limits. Both lungs are clear. The visualized skeletal structures are unremarkable. IMPRESSION: No active cardiopulmonary disease. Electronically Signed   By: Minerva Fester M.D.   On: 10/10/2023 20:58    Procedures Procedures  {Document cardiac monitor, telemetry assessment procedure when appropriate:1}  Medications Ordered in ED Medications  oxyCODONE-acetaminophen (PERCOCET/ROXICET) 5-325 MG per tablet 2 tablet (2 tablets Oral Given 10/10/23 1823)  iohexol (OMNIPAQUE) 300 MG/ML solution 100 mL (100 mLs Intravenous Contrast Given 10/10/23 1958)  fentaNYL (SUBLIMAZE) injection 25 mcg (25 mcg Intravenous Given 10/10/23 2257)  ondansetron (ZOFRAN) injection 4 mg (4 mg Intravenous Given 10/10/23 2257)    ED Course/ Medical Decision Making/ A&P   {   Click here for ABCD2, HEART and other calculatorsREFRESH Note before signing :1}                              Medical Decision Making Risk Prescription drug management.   This patient presents to the ED for concern of abdominal pain, this involves an extensive number of treatment options, and is a complaint that carries with it a high risk of complications and morbidity.  The differential diagnosis includes cholecystitis, CBD pathology, pancreatitis, SBO/LBO, diverticulitis, gastritis, PUD, SBO/LBO, volvulus,  pyelonephritis, nephrolithiasis, other   Co morbidities that complicate the patient evaluation  See HPI   Additional history obtained:  Additional history obtained from EMR External records from outside source obtained and reviewed including hospital records   Lab Tests:  I Ordered, and personally interpreted labs.  The pertinent results include: No leukocytosis.  Evidence of anemia hemoglobin 11.0.  Platelets within range.  Troponin of less than 2.  Lipase mildly elevated 53.  Hyponatremia 132.  Decreased bicarb of 17.  Otherwise, electrolytes within limits.  No transaminitis.  No renal dysfunction.   Imaging Studies ordered:  I ordered imaging studies including chest x-ray, CT abdomen pelvis I independently visualized and interpreted imaging which showed  Chest x-ray: No acute cardiopulmonary abnormalities CT abdomen pelvis:*** I agree with the radiologist interpretation   Cardiac Monitoring: / EKG:  The patient was maintained on a cardiac monitor.  I personally viewed and interpreted the cardiac monitored which showed an underlying rhythm of: Sinus rhythm   Consultations Obtained:  I requested consultation with the ***,  and discussed lab and imaging findings as well as pertinent plan - they recommend: ***   Problem List / ED Course / Critical interventions / Medication management  Abdominal pain I ordered medication including Percocet, fentanyl, Zofran   Reevaluation of the patient after these medicines showed that the patient improved I have reviewed the patients home medicines and have made adjustments as needed   Social Determinants of Health:  History of alcohol abuse.  Former cigarette use.   Test / Admission - Considered:  Abdominal pain Vitals signs significant for hypertension blood pressure 142/82. Otherwise within normal range and stable throughout visit. Laboratory/imaging studies significant for: See above *** Worrisome signs and symptoms were  discussed with the patient, and the patient acknowledged understanding to return to the ED if noticed. Patient was stable upon discharge.    {Document critical care time when appropriate:1} {Document review of labs and clinical decision tools ie heart score, Chads2Vasc2 etc:1}  {Document your independent review of radiology images, and any outside records:1} {Document your discussion with family members, caretakers, and with consultants:1} {Document social determinants of health affecting pt's care:1} {Document your  decision making why or why not admission, treatments were needed:1} Final Clinical Impression(s) / ED Diagnoses Final diagnoses:  None    Rx / DC Orders ED Discharge Orders     None

## 2023-10-10 NOTE — ED Provider Triage Note (Addendum)
Emergency Medicine Provider Triage Evaluation Note  Ian Hall , a 57 y.o. male  was evaluated in triage.  Pt complains of right chest pain.  Review of Systems  Positive: Right chest pain w/o SOB Negative: Nausea, fever  Physical Exam  BP (!) 142/90 (BP Location: Right Arm)   Pulse 65   Temp 97.9 F (36.6 C) (Oral)   Resp 18   Ht 5\' 7"  (1.702 m)   Wt 91.6 kg   SpO2 100%   BMI 31.63 kg/m  Gen:   Awake, no distress  Uncomfortable appearing Resp:  Normal effort Good unrestricted air movement MSK:   Moves extremities without difficulty  Other:  Well healed RUQ surgical scar from transplant. Nontender abdomen  Medical Decision Making  Medically screening exam initiated at 7:32 PM.  Appropriate orders placed.  SISTO INGRUM was informed that the remainder of the evaluation will be completed by another provider, this initial triage assessment does not replace that evaluation, and the importance of remaining in the ED until their evaluation is complete.  Patient with h/o liver transplant. Was seen this morning to have biliary stent removed. He reports they removed one and placed another (?). Now having right sided non-radiating chest pain.   Elpidio Anis, PA-C 10/10/23 1731  19:34 - Patient reporting increased pain. No perforation visualized on CXR. CT ordered.     Elpidio Anis, PA-C 10/10/23 1934

## 2023-10-10 NOTE — Discharge Instructions (Signed)
As discussed, workup today were reassuring.  Your heart enzymes were normal.  It does not appear like you are having a heart attack.  The CT scan of your abdomen did show evidence of normal postop changes without any concerning features.  Will send in medication for pain as well as nausea.  Recommend calling her surgeon for reevaluation.  If you develop worsening pain, fever, intractable nausea/vomiting, please return to emergency department.

## 2023-10-10 NOTE — ED Provider Notes (Signed)
[facility] AT [facility] Provider Note   CSN: [id number] Arrival date & time: 10/10/23  1636     History  Chief Complaint  Patient presents with   Chest Pain    Ian Hall is a 57 y.o. male.   Chest Pain   57 year old male presents emergency department with complaints of chest pain and abdominal pain.  Patient had ERCP performed for CBD stent replacement in [place] this morning.  States that when he woke up from anesthesia, had symptoms.  Patient told his GI doctor after awakening and was sent in pain medication for postop pain.  Patient reports pain despite using pain medication.  Reports 1 episode of feeling nauseous and vomiting when he is having his blood drawn here in the emergency department.  Denies any fever, shortness of breath, urinary symptoms, change in bowel habits.  Patient states that he "just wants to make sure that everything is okay."  Past medical history significant for cirrhosis with liver transplant, DVT, thrombocytopenia, alcohol abuse, diabetes mellitus, CKD 3  Home Medications Prior to Admission medications   Medication Sig Start Date End Date Taking? Authorizing Provider  ondansetron (ZOFRAN-ODT) 4 MG disintegrating tablet Take 1 tablet (4 mg total) by mouth every 8 (eight) hours as needed. 10/10/23  Yes Sherian Maroon A, PA  oxyCODONE (ROXICODONE) 5 MG immediate release tablet Take 1 tablet (5 mg total) by mouth every 6 (six) hours as needed for severe pain (pain score 7-10). 10/10/23  Yes Sherian Maroon A, PA  ACCU-CHEK GUIDE test strip USE AS DIRECTED TWO TIMES A DAY AS NEEDED    [provider]  acetaminophen (TYLENOL) 500 MG tablet Take 500 mg by mouth every 6 (six) hours as needed for moderate pain or mild pain.    [provider]  albuterol (VENTOLIN HFA) 108 (90 Base) MCG/ACT inhaler INHALE 2 PUFFS INTO THE LUNGS EVERY 6 HOURS AS NEEDED FOR WHEEZING OR SHORTNESS OF BREATH 04/28/21   Janeece Agee, NP  amLODipine (NORVASC) 5 MG tablet Take 5 mg by mouth daily.    [provider]  ASPIRIN LOW DOSE 81 MG tablet Take 81 mg by mouth daily. 08/28/22   [provider]  Continuous Glucose Receiver (FREESTYLE LIBRE 3 READER) DEVI CHECK GLUCOSE FASTING AND TWO HOUR POST PRANDIAL    [provider]  Continuous Glucose Sensor (FREESTYLE LIBRE 3 SENSOR) MISC PLACE ONE SENSOR TO THE BACK OF YOUR UPPER ARM. REPLACE EVERY 14 DAYS. 08/25/23   [provider]  diclofenac Sodium (VOLTAREN) 1 % GEL Apply 4 g topically 4 (four) times daily as needed. 09/27/23   Garnette Gunner, MD  docusate sodium (COLACE) 100 MG capsule Take 100 mg by mouth 2 (two) times daily as needed. 08/28/22   [provider]  fluticasone furoate-vilanterol (BREO ELLIPTA) 100-25 MCG/INH AEPB Inhale 1 puff into the lungs daily. 06/03/21   Martina Sinner, MD  glipiZIDE (GLUCOTROL) 5 MG tablet Take 1 tablet (5 mg total) by mouth 2 (two) times daily before a meal. 09/21/22 10/21/22  Morene Crocker, MD  glucose blood (PRECISION QID TEST) test strip Check glucose twice daily as needed. 11/03/22   [provider]  hydrOXYzine (ATARAX) 10 MG tablet Take 1 tablet (10 mg total) by mouth 3 (three) times daily as needed for itching. 09/27/23   Garnette Gunner, MD  Lancets (FREESTYLE) lancets 1 Units by Misc.(Non-Drug; Combo Route) route 2 times daily. 11/03/22   [provider]  metFORMIN (GLUCOPHAGE-XR) 500 MG 24 hr tablet Take 1,000 mg by mouth every morning.    [provider]  mycophenolate (CELLCEPT) 500 MG tablet Take 500 mg by mouth 2 (two) times daily. 07/30/22   [provider]  sertraline (ZOLOFT) 50 MG tablet Take 1 tablet (50 mg total) by mouth daily. 09/27/23 09/21/24  Garnette Gunner, MD  Specialty Vitamins Products (MAGNESIUM, AMINO ACID CHELATE,) 133 MG tablet Take 2 tablets by mouth 3 (three) times daily. 09/14/22   [provider]   tacrolimus (PROGRAF) 1 MG capsule Take by mouth. 08/26/22   [provider]      Allergies    Hydrocodone    Review of Systems   Review of Systems  Cardiovascular:  Positive for chest pain.  All other systems reviewed and are negative.   Physical Exam Updated Vital Signs BP 126/82   Pulse 70   Temp 98.1 F (36.7 C)   Resp 19   Ht 5\' 7"  (1.702 m)   Wt 91.6 kg   SpO2 95%   BMI 31.63 kg/m  Physical Exam Vitals and nursing note reviewed.  Constitutional:      General: He is not in acute distress.    Appearance: He is well-developed.  HENT:     Head: Normocephalic and atraumatic.  Eyes:     Conjunctiva/sclera: Conjunctivae normal.  Cardiovascular:     Rate and Rhythm: Normal rate and regular rhythm.     Pulses: Normal pulses.     Heart sounds: No murmur heard. Pulmonary:     Effort: Pulmonary effort is normal. No respiratory distress.     Breath sounds: Normal breath sounds. No wheezing, rhonchi or rales.  Abdominal:     Palpations: Abdomen is soft.     Tenderness: There is abdominal tenderness. There is no right CVA tenderness, left CVA tenderness or guarding.     Comments: Patient with overall soft abdomen with slight pain right upper quadrant.  Musculoskeletal:        General: No swelling.     Cervical back: Neck supple.  Skin:    General: Skin is warm and dry.     Capillary Refill: Capillary refill takes less than 2 seconds.  Neurological:     Mental Status: He is alert.  Psychiatric:        Mood and Affect: Mood normal.     ED Results / Procedures / Treatments   Labs (all labs ordered are listed, but only abnormal results are displayed) Labs Reviewed  CBC WITH DIFFERENTIAL/PLATELET - Abnormal; Notable for the following components:      Result Value   RBC 3.98 (*)    Hemoglobin 11.0 (*)    HCT 33.5 (*)    Lymphs Abs 0.4 (*)    Abs Immature Granulocytes 0.10 (*)    All other components within normal limits  COMPREHENSIVE METABOLIC PANEL -  Abnormal; Notable for the following components:   Sodium 132 (*)    CO2 17 (*)    Glucose, Bld 230 (*)    All other components within normal limits  LIPASE, BLOOD - Abnormal; Notable for the following components:   Lipase 53 (*)    All other components within normal limits  TROPONIN I (HIGH SENSITIVITY)  TROPONIN I (HIGH SENSITIVITY)    EKG EKG Interpretation Date/Time:  Monday October 10 2023 17:03:11 EST Ventricular Rate:  61 PR Interval:  156 QRS Duration:  91 QT Interval:  368 QTC Calculation: 371 R Axis:  68  Text Interpretation: Sinus rhythm Abnormal R-wave progression, early transition Confirmed by Alvester Chou [phone]) on 10/10/2023 9:04:09 PM  Radiology CT ABDOMEN PELVIS W CONTRAST  Result Date: 10/10/2023 CLINICAL DATA:  Post biliary drain insertion abdomen pain EXAM: CT ABDOMEN AND PELVIS WITH CONTRAST TECHNIQUE: Multidetector CT imaging of the abdomen and pelvis was performed using the standard protocol following bolus administration of intravenous contrast. RADIATION DOSE REDUCTION: This exam was performed according to the departmental dose-optimization program which includes automated exposure control, adjustment of the mA and/or kV according to patient size and/or use of iterative reconstruction technique. CONTRAST:  OMNIPAQUE IOHEXOL 300 MG/ML  SOLN COMPARISON:  MRI 05/29/2021 FINDINGS: Lower chest: Lung bases demonstrate no acute airspace disease. Hepatobiliary: History of liver transplant. Gallbladder absent. Small amount of pneumobilia. Stent in the common bile duct extending from the hepatic hilus to the second portion of duodenum. Minimal stranding at the porta hepatis but no fluid collection. Pancreas: Unremarkable. No pancreatic ductal dilatation or surrounding inflammatory changes. Spleen: Normal in size without focal abnormality. Adrenals/Urinary Tract: Adrenal glands are unremarkable. Kidneys are normal, without renal calculi, focal lesion, or  hydronephrosis. Bladder is unremarkable. Stomach/Bowel: Stomach is within normal limits. Appendix appears normal. No evidence of bowel wall thickening, distention, or inflammatory changes. Diverticular disease of the colon without acute inflammatory process. Vascular/Lymphatic: Mild atherosclerosis. No aneurysm. No suspicious lymph nodes borderline porta hepatis node measuring 9 mm. Reproductive: Prostate is unremarkable. Other: Negative for pelvic effusion or free air Musculoskeletal: No acute or suspicious osseous abnormality. IMPRESSION: 1. Biliary stent within the common bile duct extending from the hepatic hilus to the second portion of duodenum. Small amount of expected pneumobilia. Minimal stranding at the porta hepatis suggesting mild inflammation but no fluid collections 2. Diverticular disease of the colon without acute inflammatory process. Electronically Signed   By: Jasmine Pang M.D.   On: 10/10/2023 23:13   DG Chest 2 View  Result Date: 10/10/2023 CLINICAL DATA:  Right chest pain EXAM: CHEST - 2 VIEW COMPARISON:  08/22/2021 FINDINGS: The heart size and mediastinal contours are within normal limits. Both lungs are clear. The visualized skeletal structures are unremarkable. IMPRESSION: No active cardiopulmonary disease. Electronically Signed   By: Minerva Fester M.D.   On: 10/10/2023 20:58    Procedures Procedures    Medications Ordered in ED Medications  oxyCODONE-acetaminophen (PERCOCET/ROXICET) 5-325 MG per tablet 2 tablet (2 tablets Oral Given 10/10/23 1823)  iohexol (OMNIPAQUE) 300 MG/ML solution 100 mL (100 mLs Intravenous Contrast Given 10/10/23 1958)  fentaNYL (SUBLIMAZE) injection 25 mcg (25 mcg Intravenous Given 10/10/23 2257)  ondansetron (ZOFRAN) injection 4 mg (4 mg Intravenous Given 10/10/23 2257)    ED Course/ Medical Decision Making/ A&P                                 Medical Decision Making Risk Prescription drug management.   This patient presents to the  ED for concern of abdominal pain, this involves an extensive number of treatment options, and is a complaint that carries with it a high risk of complications and morbidity.  The differential diagnosis includes cholecystitis, CBD pathology, pancreatitis, SBO/LBO, diverticulitis, gastritis, PUD, SBO/LBO, volvulus, pyelonephritis, nephrolithiasis, other   Co morbidities that complicate the patient evaluation  See HPI   Additional history obtained:  Additional history obtained from EMR External records from outside source obtained and reviewed including hospital records   Lab Tests:  I Ordered,  and personally interpreted labs.  The pertinent results include: No leukocytosis.  Evidence of anemia hemoglobin 11.0.  Platelets within range.  Troponin of less than 2.  Lipase mildly elevated 53.  Hyponatremia 132.  Decreased bicarb of 17.  Otherwise, electrolytes within limits.  No transaminitis.  No renal dysfunction.   Imaging Studies ordered:  I ordered imaging studies including chest x-ray, CT abdomen pelvis I independently visualized and interpreted imaging which showed  Chest x-ray: No acute cardiopulmonary abnormalities CT abdomen pelvis: Biliary stent within the common bile duct.  Small amount of pneumobilia.  Minimal stranding of porta hepatis.  Diverticular disease. I agree with the radiologist interpretation  Cardiac Monitoring: / EKG:  The patient was maintained on a cardiac monitor.  I personally viewed and interpreted the cardiac monitored which showed an underlying rhythm of: Sinus rhythm   Consultations Obtained:  N/a   Problem List / ED Course / Critical interventions / Medication management  Right upper quadrant abdominal pain I ordered medication including Percocet, fentanyl, Zofran   Reevaluation of the patient after these medicines showed that the patient improved I have reviewed the patients home medicines and have made adjustments as needed   Social  Determinants of Health:  History of alcohol abuse.  Former cigarette use.   Test / Admission - Considered:  Right upper quadrant abdominal pain Vitals signs significant for hypertension blood pressure 142/82. Otherwise within normal range and stable throughout visit. Laboratory/imaging studies significant for: See above 57 year old male presents emergency department with complaints of right upper abdominal pain/right lower chest pain since ERCP with CBD stent placement earlier today.  Patient had symptoms right after procedure was performed but was told was normal postop pain per patient.  On exam, patient reporting tenderness right lower chest as well as right upper abdomen.  Abdomen rather soft with mild tenderness right upper quadrant.  Labs reassuring with normal liver function, bilirubin, alkaline phosphatase.  No leukocytosis.  CT imaging concerning for expected postop changes.  Patient treated with medication while in the emergency department noted resolution of symptoms with subsequent passage of p.o. trial and serial benign abdominal exam.  Discussion was had with patient regarding getting in touch with GI provider group that performed procedure but patient stated that he "just wanted to make sure everything was okay" and would rather follow-up outpatient.  Will recommend very close follow-up with GI specialist in the outpatient setting for reassessment.  Strict return precautions discussed at length.  Treatment plan discussed with patient and he acknowledged understand was agreeable to said plan.  Patient overall well-appearing, afebrile in no acute distress. Worrisome signs and symptoms were discussed with the patient, and the patient acknowledged understanding to return to the ED if noticed. Patient was stable upon discharge.          Final Clinical Impression(s) / ED Diagnoses Final diagnoses:  Right upper quadrant abdominal pain    Rx / DC Orders ED Discharge Orders     None          Peter Garter, [place] 10/11/23 0018    Gloris Manchester, MD 10/11/23 [phone]

## 2023-10-11 ENCOUNTER — Ambulatory Visit: Payer: Medicare Other | Admitting: Physical Therapy

## 2023-10-11 NOTE — ED Notes (Signed)
Pt given graham crackers and ginger ale ?
